# Patient Record
Sex: Female | Born: 1955
Health system: Southern US, Community
[De-identification: ages and names within clinical notes are randomized; demographics above are authoritative.]

## PROBLEM LIST (undated history)

## (undated) DIAGNOSIS — R079 Chest pain, unspecified: Secondary | ICD-10-CM

## (undated) DIAGNOSIS — M545 Low back pain, unspecified: Secondary | ICD-10-CM

## (undated) DIAGNOSIS — I8500 Esophageal varices without bleeding: Secondary | ICD-10-CM

## (undated) DIAGNOSIS — R413 Other amnesia: Secondary | ICD-10-CM

## (undated) DIAGNOSIS — Z794 Long term (current) use of insulin: Secondary | ICD-10-CM

## (undated) DIAGNOSIS — G4730 Sleep apnea, unspecified: Secondary | ICD-10-CM

## (undated) DIAGNOSIS — IMO0001 Reserved for inherently not codable concepts without codable children: Secondary | ICD-10-CM

## (undated) DIAGNOSIS — M1990 Unspecified osteoarthritis, unspecified site: Secondary | ICD-10-CM

## (undated) DIAGNOSIS — M25511 Pain in right shoulder: Secondary | ICD-10-CM

## (undated) DIAGNOSIS — F419 Anxiety disorder, unspecified: Secondary | ICD-10-CM

## (undated) DIAGNOSIS — M79606 Pain in leg, unspecified: Secondary | ICD-10-CM

## (undated) DIAGNOSIS — F32A Depression, unspecified: Secondary | ICD-10-CM

## (undated) DIAGNOSIS — K259 Gastric ulcer, unspecified as acute or chronic, without hemorrhage or perforation: Secondary | ICD-10-CM

## (undated) DIAGNOSIS — R600 Localized edema: Secondary | ICD-10-CM

## (undated) DIAGNOSIS — K760 Fatty (change of) liver, not elsewhere classified: Secondary | ICD-10-CM

## (undated) DIAGNOSIS — G5712 Meralgia paresthetica, left lower limb: Secondary | ICD-10-CM

## (undated) DIAGNOSIS — Z8679 Personal history of other diseases of the circulatory system: Secondary | ICD-10-CM

## (undated) DIAGNOSIS — J189 Pneumonia, unspecified organism: Secondary | ICD-10-CM

## (undated) DIAGNOSIS — R569 Unspecified convulsions: Secondary | ICD-10-CM

## (undated) DIAGNOSIS — F329 Major depressive disorder, single episode, unspecified: Secondary | ICD-10-CM

## (undated) DIAGNOSIS — A389 Scarlet fever, uncomplicated: Secondary | ICD-10-CM

## (undated) DIAGNOSIS — K7460 Unspecified cirrhosis of liver: Secondary | ICD-10-CM

## (undated) DIAGNOSIS — K769 Liver disease, unspecified: Secondary | ICD-10-CM

## (undated) DIAGNOSIS — Z8669 Personal history of other diseases of the nervous system and sense organs: Secondary | ICD-10-CM

## (undated) DIAGNOSIS — E119 Type 2 diabetes mellitus without complications: Secondary | ICD-10-CM

## (undated) DIAGNOSIS — S8412XA Injury of peroneal nerve at lower leg level, left leg, initial encounter: Secondary | ICD-10-CM

## (undated) DIAGNOSIS — I272 Pulmonary hypertension, unspecified: Secondary | ICD-10-CM

## (undated) DIAGNOSIS — G5601 Carpal tunnel syndrome, right upper limb: Secondary | ICD-10-CM

## (undated) DIAGNOSIS — M2550 Pain in unspecified joint: Secondary | ICD-10-CM

## (undated) DIAGNOSIS — F445 Conversion disorder with seizures or convulsions: Secondary | ICD-10-CM

## (undated) DIAGNOSIS — N289 Disorder of kidney and ureter, unspecified: Secondary | ICD-10-CM

## (undated) DIAGNOSIS — K219 Gastro-esophageal reflux disease without esophagitis: Secondary | ICD-10-CM

## (undated) DIAGNOSIS — Z87442 Personal history of urinary calculi: Secondary | ICD-10-CM

## (undated) DIAGNOSIS — Z5189 Encounter for other specified aftercare: Secondary | ICD-10-CM

## (undated) DIAGNOSIS — M21372 Foot drop, left foot: Secondary | ICD-10-CM

## (undated) DIAGNOSIS — Z8614 Personal history of Methicillin resistant Staphylococcus aureus infection: Secondary | ICD-10-CM

## (undated) DIAGNOSIS — H269 Unspecified cataract: Secondary | ICD-10-CM

## (undated) DIAGNOSIS — K829 Disease of gallbladder, unspecified: Secondary | ICD-10-CM

## (undated) DIAGNOSIS — T84498A Other mechanical complication of other internal orthopedic devices, implants and grafts, initial encounter: Secondary | ICD-10-CM

## (undated) DIAGNOSIS — E079 Disorder of thyroid, unspecified: Secondary | ICD-10-CM

## (undated) DIAGNOSIS — M65319 Trigger thumb, unspecified thumb: Secondary | ICD-10-CM

## (undated) DIAGNOSIS — E785 Hyperlipidemia, unspecified: Secondary | ICD-10-CM

## (undated) DIAGNOSIS — I10 Essential (primary) hypertension: Secondary | ICD-10-CM

## (undated) DIAGNOSIS — N189 Chronic kidney disease, unspecified: Secondary | ICD-10-CM

## (undated) DIAGNOSIS — R0602 Shortness of breath: Secondary | ICD-10-CM

## (undated) HISTORY — DX: Depression, unspecified: F32.A

## (undated) HISTORY — DX: Localized edema: R60.0

## (undated) HISTORY — DX: Low back pain, unspecified: M54.50

## (undated) HISTORY — DX: Anxiety disorder, unspecified: F41.9

## (undated) HISTORY — DX: Gastric ulcer, unspecified as acute or chronic, without hemorrhage or perforation: K25.9

## (undated) HISTORY — PX: CHOLECYSTECTOMY: SHX55

## (undated) HISTORY — DX: Pain in leg, unspecified: M79.606

## (undated) HISTORY — DX: Chest pain, unspecified: R07.9

## (undated) HISTORY — DX: Major depressive disorder, single episode, unspecified: F32.9

## (undated) HISTORY — PX: SPINE SURGERY: SHX786

## (undated) HISTORY — DX: Morbid (severe) obesity due to excess calories: E66.01

## (undated) HISTORY — DX: Hyperlipidemia, unspecified: E78.5

## (undated) HISTORY — DX: Unspecified osteoarthritis, unspecified site: M19.90

## (undated) HISTORY — DX: Fatty (change of) liver, not elsewhere classified: K76.0

## (undated) HISTORY — PX: ABDOMINAL HYSTERECTOMY: SHX81

## (undated) HISTORY — DX: Disorder of kidney and ureter, unspecified: N28.9

## (undated) HISTORY — DX: Type 2 diabetes mellitus without complications: E11.9

## (undated) HISTORY — PX: APPENDECTOMY: SHX54

## (undated) HISTORY — PX: TUBAL LIGATION: SHX77

## (undated) HISTORY — DX: Liver disease, unspecified: K76.9

## (undated) HISTORY — DX: Pain in unspecified joint: M25.50

## (undated) HISTORY — DX: Sleep apnea, unspecified: G47.30

## (undated) HISTORY — DX: Pain in right shoulder: M25.511

## (undated) HISTORY — DX: Disease of gallbladder, unspecified: K82.9

## (undated) HISTORY — DX: Disorder of thyroid, unspecified: E07.9

## (undated) HISTORY — DX: Essential (primary) hypertension: I10

---
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
## 1997-11-13 ENCOUNTER — Ambulatory Visit (HOSPITAL_COMMUNITY): Admission: RE | Admit: 1997-11-13 | Discharge: 1997-11-14 | Payer: Self-pay | Admitting: Urology

## 1998-06-13 ENCOUNTER — Emergency Department (HOSPITAL_COMMUNITY): Admission: EM | Admit: 1998-06-13 | Discharge: 1998-06-13 | Payer: Self-pay | Admitting: Emergency Medicine

## 1999-02-21 ENCOUNTER — Encounter: Admission: RE | Admit: 1999-02-21 | Discharge: 1999-02-21 | Payer: Self-pay | Admitting: Family Medicine

## 1999-02-21 ENCOUNTER — Encounter: Payer: Self-pay | Admitting: Family Medicine

## 1999-03-29 ENCOUNTER — Emergency Department (HOSPITAL_COMMUNITY): Admission: EM | Admit: 1999-03-29 | Discharge: 1999-03-29 | Payer: Self-pay | Admitting: Emergency Medicine

## 1999-03-29 ENCOUNTER — Encounter: Payer: Self-pay | Admitting: Emergency Medicine

## 1999-08-18 ENCOUNTER — Inpatient Hospital Stay (HOSPITAL_COMMUNITY): Admission: EM | Admit: 1999-08-18 | Discharge: 1999-08-19 | Payer: Self-pay | Admitting: Psychiatry

## 1999-08-21 ENCOUNTER — Other Ambulatory Visit (HOSPITAL_COMMUNITY): Admission: RE | Admit: 1999-08-21 | Discharge: 1999-09-05 | Payer: Self-pay | Admitting: Specialist

## 1999-11-06 ENCOUNTER — Encounter: Admission: RE | Admit: 1999-11-06 | Discharge: 1999-11-06 | Payer: Self-pay | Admitting: Family Medicine

## 1999-11-06 ENCOUNTER — Encounter: Payer: Self-pay | Admitting: Family Medicine

## 2000-03-09 ENCOUNTER — Emergency Department (HOSPITAL_COMMUNITY): Admission: EM | Admit: 2000-03-09 | Discharge: 2000-03-09 | Payer: Self-pay | Admitting: Emergency Medicine

## 2000-03-17 ENCOUNTER — Other Ambulatory Visit: Admission: RE | Admit: 2000-03-17 | Discharge: 2000-03-17 | Payer: Self-pay | Admitting: Obstetrics and Gynecology

## 2000-07-18 ENCOUNTER — Encounter: Payer: Self-pay | Admitting: *Deleted

## 2000-07-18 ENCOUNTER — Inpatient Hospital Stay (HOSPITAL_COMMUNITY): Admission: EM | Admit: 2000-07-18 | Discharge: 2000-07-19 | Payer: Self-pay | Admitting: *Deleted

## 2001-03-30 ENCOUNTER — Ambulatory Visit (HOSPITAL_BASED_OUTPATIENT_CLINIC_OR_DEPARTMENT_OTHER): Admission: RE | Admit: 2001-03-30 | Discharge: 2001-03-30 | Payer: Self-pay | Admitting: Orthopaedic Surgery

## 2001-03-30 HISTORY — PX: TOENAIL EXCISION: SHX183

## 2001-03-30 HISTORY — PX: FIBULAR SESAMOID EXCISION: SHX1632

## 2001-08-04 ENCOUNTER — Encounter: Admission: RE | Admit: 2001-08-04 | Discharge: 2001-08-04 | Payer: Self-pay | Admitting: Family Medicine

## 2001-08-04 ENCOUNTER — Encounter: Payer: Self-pay | Admitting: Family Medicine

## 2001-08-18 ENCOUNTER — Other Ambulatory Visit: Admission: RE | Admit: 2001-08-18 | Discharge: 2001-08-18 | Payer: Self-pay | Admitting: Obstetrics and Gynecology

## 2001-11-29 ENCOUNTER — Emergency Department (HOSPITAL_COMMUNITY): Admission: EM | Admit: 2001-11-29 | Discharge: 2001-11-29 | Payer: Self-pay | Admitting: Emergency Medicine

## 2002-01-26 ENCOUNTER — Encounter: Admission: RE | Admit: 2002-01-26 | Discharge: 2002-01-26 | Payer: Self-pay | Admitting: Family Medicine

## 2002-01-26 ENCOUNTER — Encounter: Payer: Self-pay | Admitting: Family Medicine

## 2002-01-30 ENCOUNTER — Emergency Department (HOSPITAL_COMMUNITY): Admission: EM | Admit: 2002-01-30 | Discharge: 2002-01-31 | Payer: Self-pay

## 2002-01-31 ENCOUNTER — Encounter: Payer: Self-pay | Admitting: Emergency Medicine

## 2002-04-25 ENCOUNTER — Ambulatory Visit (HOSPITAL_COMMUNITY): Admission: RE | Admit: 2002-04-25 | Discharge: 2002-04-25 | Payer: Self-pay | Admitting: Gastroenterology

## 2002-04-26 ENCOUNTER — Encounter: Payer: Self-pay | Admitting: Family Medicine

## 2002-04-26 ENCOUNTER — Encounter: Admission: RE | Admit: 2002-04-26 | Discharge: 2002-04-26 | Payer: Self-pay | Admitting: Family Medicine

## 2002-08-21 ENCOUNTER — Encounter: Payer: Self-pay | Admitting: Emergency Medicine

## 2002-08-21 ENCOUNTER — Emergency Department (HOSPITAL_COMMUNITY): Admission: EM | Admit: 2002-08-21 | Discharge: 2002-08-21 | Payer: Self-pay | Admitting: Emergency Medicine

## 2002-10-19 ENCOUNTER — Encounter: Admission: RE | Admit: 2002-10-19 | Discharge: 2002-10-19 | Payer: Self-pay | Admitting: Family Medicine

## 2002-10-19 ENCOUNTER — Encounter: Payer: Self-pay | Admitting: Family Medicine

## 2004-04-01 ENCOUNTER — Encounter: Admission: RE | Admit: 2004-04-01 | Discharge: 2004-04-01 | Payer: Self-pay | Admitting: Family Medicine

## 2004-06-09 ENCOUNTER — Ambulatory Visit (HOSPITAL_BASED_OUTPATIENT_CLINIC_OR_DEPARTMENT_OTHER): Admission: RE | Admit: 2004-06-09 | Discharge: 2004-06-09 | Payer: Self-pay | Admitting: Family Medicine

## 2004-06-16 ENCOUNTER — Ambulatory Visit: Payer: Self-pay | Admitting: Internal Medicine

## 2004-10-01 ENCOUNTER — Inpatient Hospital Stay (HOSPITAL_COMMUNITY): Admission: EM | Admit: 2004-10-01 | Discharge: 2004-10-02 | Payer: Self-pay | Admitting: Emergency Medicine

## 2004-10-01 HISTORY — PX: CARDIAC CATHETERIZATION: SHX172

## 2005-06-24 ENCOUNTER — Encounter: Admission: RE | Admit: 2005-06-24 | Discharge: 2005-06-24 | Payer: Self-pay | Admitting: *Deleted

## 2005-07-03 ENCOUNTER — Encounter: Payer: Self-pay | Admitting: Interventional Radiology

## 2005-07-15 ENCOUNTER — Ambulatory Visit (HOSPITAL_COMMUNITY): Admission: RE | Admit: 2005-07-15 | Discharge: 2005-07-15 | Payer: Self-pay | Admitting: Interventional Radiology

## 2006-09-20 ENCOUNTER — Emergency Department (HOSPITAL_COMMUNITY): Admission: EM | Admit: 2006-09-20 | Discharge: 2006-09-20 | Payer: Self-pay | Admitting: Emergency Medicine

## 2006-12-19 ENCOUNTER — Ambulatory Visit (HOSPITAL_COMMUNITY): Admission: RE | Admit: 2006-12-19 | Discharge: 2006-12-19 | Payer: Self-pay | Admitting: Family Medicine

## 2007-01-02 HISTORY — PX: COLONOSCOPY: SHX174

## 2007-02-09 ENCOUNTER — Encounter: Admission: RE | Admit: 2007-02-09 | Discharge: 2007-02-09 | Payer: Self-pay | Admitting: Family Medicine

## 2007-04-28 ENCOUNTER — Encounter: Admission: RE | Admit: 2007-04-28 | Discharge: 2007-04-28 | Payer: Self-pay | Admitting: Family Medicine

## 2007-07-27 ENCOUNTER — Inpatient Hospital Stay (HOSPITAL_COMMUNITY): Admission: EM | Admit: 2007-07-27 | Discharge: 2007-07-28 | Payer: Self-pay | Admitting: Emergency Medicine

## 2007-10-17 ENCOUNTER — Observation Stay (HOSPITAL_COMMUNITY): Admission: EM | Admit: 2007-10-17 | Discharge: 2007-10-17 | Payer: Self-pay | Admitting: Emergency Medicine

## 2007-10-17 ENCOUNTER — Ambulatory Visit: Payer: Self-pay | Admitting: Internal Medicine

## 2007-10-19 ENCOUNTER — Ambulatory Visit (HOSPITAL_COMMUNITY): Admission: RE | Admit: 2007-10-19 | Discharge: 2007-10-19 | Payer: Self-pay | Admitting: Cardiology

## 2007-10-23 ENCOUNTER — Emergency Department (HOSPITAL_BASED_OUTPATIENT_CLINIC_OR_DEPARTMENT_OTHER): Admission: EM | Admit: 2007-10-23 | Discharge: 2007-10-23 | Payer: Self-pay | Admitting: Internal Medicine

## 2007-12-01 DIAGNOSIS — G43909 Migraine, unspecified, not intractable, without status migrainosus: Secondary | ICD-10-CM | POA: Insufficient documentation

## 2007-12-01 DIAGNOSIS — L219 Seborrheic dermatitis, unspecified: Secondary | ICD-10-CM | POA: Insufficient documentation

## 2007-12-01 DIAGNOSIS — R0602 Shortness of breath: Secondary | ICD-10-CM | POA: Insufficient documentation

## 2007-12-01 DIAGNOSIS — I10 Essential (primary) hypertension: Secondary | ICD-10-CM | POA: Insufficient documentation

## 2007-12-01 DIAGNOSIS — N318 Other neuromuscular dysfunction of bladder: Secondary | ICD-10-CM | POA: Insufficient documentation

## 2007-12-01 DIAGNOSIS — F411 Generalized anxiety disorder: Secondary | ICD-10-CM | POA: Insufficient documentation

## 2007-12-01 DIAGNOSIS — E785 Hyperlipidemia, unspecified: Secondary | ICD-10-CM | POA: Insufficient documentation

## 2007-12-01 DIAGNOSIS — E1129 Type 2 diabetes mellitus with other diabetic kidney complication: Secondary | ICD-10-CM | POA: Insufficient documentation

## 2007-12-02 ENCOUNTER — Ambulatory Visit: Payer: Self-pay | Admitting: Internal Medicine

## 2007-12-02 DIAGNOSIS — R635 Abnormal weight gain: Secondary | ICD-10-CM | POA: Insufficient documentation

## 2007-12-13 ENCOUNTER — Telehealth: Payer: Self-pay | Admitting: Internal Medicine

## 2007-12-31 ENCOUNTER — Ambulatory Visit: Payer: Self-pay | Admitting: Internal Medicine

## 2008-10-23 ENCOUNTER — Telehealth (INDEPENDENT_AMBULATORY_CARE_PROVIDER_SITE_OTHER): Payer: Self-pay | Admitting: *Deleted

## 2008-10-27 ENCOUNTER — Encounter: Payer: Self-pay | Admitting: Internal Medicine

## 2008-11-01 ENCOUNTER — Encounter (INDEPENDENT_AMBULATORY_CARE_PROVIDER_SITE_OTHER): Payer: Self-pay | Admitting: *Deleted

## 2008-11-01 DIAGNOSIS — J984 Other disorders of lung: Secondary | ICD-10-CM | POA: Insufficient documentation

## 2008-11-07 ENCOUNTER — Ambulatory Visit: Payer: Self-pay | Admitting: Cardiology

## 2008-12-14 ENCOUNTER — Ambulatory Visit: Payer: Self-pay | Admitting: Internal Medicine

## 2008-12-14 DIAGNOSIS — R930 Abnormal findings on diagnostic imaging of skull and head, not elsewhere classified: Secondary | ICD-10-CM | POA: Insufficient documentation

## 2008-12-20 ENCOUNTER — Ambulatory Visit: Payer: Self-pay | Admitting: Internal Medicine

## 2009-03-27 ENCOUNTER — Ambulatory Visit: Payer: Self-pay | Admitting: Internal Medicine

## 2009-05-25 ENCOUNTER — Ambulatory Visit (HOSPITAL_BASED_OUTPATIENT_CLINIC_OR_DEPARTMENT_OTHER): Admission: RE | Admit: 2009-05-25 | Discharge: 2009-05-25 | Payer: Self-pay | Admitting: Urology

## 2009-05-25 HISTORY — PX: CYSTOSCOPY WITH RETROGRADE PYELOGRAM, URETEROSCOPY AND STENT PLACEMENT: SHX5789

## 2009-06-09 ENCOUNTER — Emergency Department (HOSPITAL_COMMUNITY): Admission: EM | Admit: 2009-06-09 | Discharge: 2009-06-09 | Payer: Self-pay | Admitting: Emergency Medicine

## 2010-03-23 ENCOUNTER — Encounter: Payer: Self-pay | Admitting: Obstetrics and Gynecology

## 2010-04-02 NOTE — Assessment & Plan Note (Signed)
Summary: Pulmonary/ final f/u ov with nl 02 sats with ex   Primary Provider/Referring Provider:  Derrill Memo  CC:  3 month followup.  Pt states that she is tru=ying to quit smoking- still smokes 1 ppd.  On nitocine patch and this seems to help some.  She states that she still c/o SOB with exertion.  She states that this is the same since last seen.  No new complaints today.Marland Kitchen  History of Present Illness: 73 yow smoker with gradually worsening sob since 3/09 to point where now has it sitting still whereas orignially started proportionate to activity, evolved to point where paroxsymal at rest and with talk and not necessarily consistenlty proportionate to ex.  December 02, 2007 initial eval at [facility]'s request for doe plus noisy breathing  heart pounding assoc with chest tight no benefit from inhaler.  No choking cough or loss of voice.  Thyroid and heart fine. Nexium started and it helped heartburn but not the spells.    has had extensive cardiac evaluation and also CT scan of the chest and PFTs all unrevealing (see [facility] for catalogue plus 3 ER evals). D/C lisinopril 10/1, continued nexium, reviewed diet, stopped inhalers  December 31, 2007 ov  not one iota other than no longer needing inhalers, ok sleeping after remeron and clonazepam.  DOE x across the room, sometimes sob at rest.       December 14, 2008 Followup to discuss CT Chest.  Pt c/o "pressure" across the top of her back x 1 month better lying down, not worse with ex.  Pt states that her breathing is about the same.  She has noticed some wheezing over the past few days.  Also c/o increased acid reflux symptoms since changed from nexium to omeprazole- due to ins.  On best days can do big grocery store. rec stop smoking, discuss smoking with psych,      March 27, 2009 3 month followup.  Pt states that she is tr ying to quit smoking- still smokes 1 ppd.  On nicotine patch and this seems to help some.  She states that she still c/o SOB with  exertion.  She states that this is the same since last seen.  No new complaints today. Pt denies any significant sore throat, dysphagia, itching, sneezing,  nasal congestion or excess secretions,  fever, chills, sweats, unintended wt loss, pleuritic or exertional cp, hempoptysis, change in activity tolerance  orthopnea pnd or leg swelling Pt also denies any fluctuation in symptoms with weather or environmental change or other alleviating or factors         Current Medications (verified): 1)  Simvastatin 80 Mg Tabs (Simvastatin) .Marland Kitchen.. 1 Once Daily 2)  Zetia 10 Mg Tabs (Ezetimibe) .Marland Kitchen.. 1 By Mouth Once Daily 3)  Metoprolol Tartrate 25 Mg Tabs (Metoprolol Tartrate) .Marland Kitchen.. 1 By Mouth Two Times A Day 4)  Glumetza 500 Mg Xr24h-Tab (Metformin Hcl) .... 3 Two Times A Day 5)  Miralax  Pack (Polyethylene Glycol 3350) .... Twice Per Week As Needed 6)  Cymbalta 60 Mg Cpep (Duloxetine Hcl) .... Once Daily 7)  Mirtazapine 30 Mg Tabs (Mirtazapine) .... At Bedtime 8)  Clonazepam 0.5 Mg Tabs (Clonazepam) .Marland Kitchen.. 1 Tab Two Times A Day 9)  Nitrostat 0.4 Mg Subl (Nitroglycerin) .... Once Daily As Needed 10)  Omeprazole 40 Mg Cpdr (Omeprazole) .Marland Kitchen.. 1 30 Min Before Breakfast 11)  Maxalt-Mlt 10 Mg Tbdp (Rizatriptan Benzoate) .... As Directed As Needed Migraines  Allergies (verified): No Known Drug  Allergies  Past History:  Past Medical History: Health Maintenance/ Primary...............................................................................................Marland KitchenDr Derrill Memo SEBORRHEIC DERMATITIS (ICD-690.10) OVERACTIVE BLADDER (ICD-596.51) ANXIETY DISORDER (ICD-300.00) MIGRAINE HEADACHE (ICD-346.90) HYPERLIPIDEMIA (ICD-272.4) HYPERTENSION (ICD-401.9) DIABETES MELLITUS (ICD-250.00) DYSPNEA (ICD-786.05) onset 3/09   -  neg LHC  10/01/04............................................................,....................................................Marland KitchenDr Leonia Reeves   -  nl echo 05/10/07   -  PFT's 10/19/07 FEV1 84%   -  CT chest neg 10/23/07   - PFT's nl except ERV disproportionately reduced March 27, 2009   Family History: Reviewed history from 12/31/2007 and no changes required. Father had lung cancer heart attack- grandmother and uncle emphysema- aunts Sarcoidosis- Brother  Social History: Reviewed history from 12/02/2007 and no changes required. Current smoker No ETOH Single lives with son  Vital Signs:  Patient profile:   55 year old female Weight:      280 pounds O2 Sat:      97 % on Room air Temp:     97.7 degrees F oral Pulse rate:   107 / minute BP sitting:   130 / 92  (left arm) Cuff size:   large  Vitals Entered By: Tilden Dome (March 27, 2009 11:50 AM)  O2 Flow:  Room air  Serial Vital Signs/Assessments:  Comments: 12:13 PM Ambulatory Pulse Oximetry  Resting; HR__97___    02 Sat___97__  Lap1 (185 feet)   HR___120__   02 Sat__95___ Lap2 (185 feet)   HR__123___   02 Sat__93___    Lap3 (185 feet)   HR__128___   02 Sat__93___  _x__Test Completed without Difficulty ___Test Stopped due to:  By: Francesca Jewett CMA    Physical Exam  Additional Exam:  in general she is a very anxious ambulatory obese white female in no acute distress with occasional sigh breaths  but no longer classic voice fatigue or pseudowheeze resolves with purse lip maneuver  Wt 284  12/02/07 > 294  December 31, 2007 > December 14, 2008 294  > 280 March 27, 2009  Afeb with normal vital signs HEENT: nl dentition, turbinates, and orophanx. Nl external ear canals without cough reflex Neck without JVD/Nodes/TM Lungs clear to A and P bilaterally without cough on insp or exp maneuvers RRR no s3 or murmur or increase in P2 Abd soft and benign with nl excursion in the supine position. No bruits or organomegaly Ext warm without calf tenderness, cyanosis clubbing or edema Skin warm and dry without lesions     Impression & Recommendations:  Problem # 1:  DYSPNEA (ICD-786.05)   Most likely secondary  to obesity/ deconditioning confirmed today  with pft's.   Weight control is a matter of calorie balance which needs to be tilted in the pt's favor by eating less and exercising more.  Specifically, I recommended  exercise at a level where pt  is short of breath but not out of breath 30 minutes daily.  If not losing weight on this program, I would strongly recommend pt see a nutritionist with a food diary recorded for two weeks prior to the visit.     Medications Added to Medication List This Visit: 1)  Simvastatin 80 Mg Tabs (Simvastatin) .Marland Kitchen.. 1 once daily  Other Orders: Est. Patient Level III (28413) Pulse Oximetry, Ambulatory (24401)  Patient Instructions: 1)  The only abnormality on your PFT's now is directly related to your weight.  This is not lung damage from smoking which will not occur at this point as long as you quit 2)  Weight control is simply a matter of calorie balance which needs  to be tilted in your favor by eating less and exercising more.  To get the most out of exercise, you need to be continuously aware that you are short of breath, but never out of breath, for 30 minutes daily. As you improve, it will actually be easier for you to do the same amount in  30 minutes so always push to the level where you are short of breath.  If this does not result in gradual weight reduction,  I recommend  a nutritionist for a food diary. 3)  Please schedule a follow-up appointment as needed.

## 2010-04-13 ENCOUNTER — Emergency Department (HOSPITAL_BASED_OUTPATIENT_CLINIC_OR_DEPARTMENT_OTHER)
Admission: EM | Admit: 2010-04-13 | Discharge: 2010-04-14 | Disposition: A | Discharge status: Home / Self Care | Payer: BC Managed Care – PPO | Attending: Emergency Medicine | Admitting: Emergency Medicine

## 2010-04-13 DIAGNOSIS — I10 Essential (primary) hypertension: Secondary | ICD-10-CM | POA: Insufficient documentation

## 2010-04-13 DIAGNOSIS — E119 Type 2 diabetes mellitus without complications: Secondary | ICD-10-CM | POA: Insufficient documentation

## 2010-04-13 DIAGNOSIS — E785 Hyperlipidemia, unspecified: Secondary | ICD-10-CM | POA: Insufficient documentation

## 2010-04-13 DIAGNOSIS — E669 Obesity, unspecified: Secondary | ICD-10-CM | POA: Insufficient documentation

## 2010-04-13 LAB — CBC
HCT: 42.1 % (ref 36.0–46.0)
Hemoglobin: 14.1 g/dL (ref 12.0–15.0)
MCH: 26.6 pg (ref 26.0–34.0)
MCHC: 33.5 g/dL (ref 30.0–36.0)
MCV: 79.4 fL (ref 78.0–100.0)
Platelets: 200 10*3/uL (ref 150–400)
RBC: 5.3 MIL/uL — ABNORMAL HIGH (ref 3.87–5.11)
RDW: 13.4 % (ref 11.5–15.5)

## 2010-04-13 LAB — POCT CARDIAC MARKERS: Troponin i, poc: <0.05 ng/mL (ref 0.00–0.09)

## 2010-04-13 LAB — COMPREHENSIVE METABOLIC PANEL
ALT: 32 U/L (ref 0–35)
AST: 54 U/L — ABNORMAL HIGH (ref 0–37)
Alkaline Phosphatase: 88 U/L (ref 39–117)
BUN: 17 mg/dL (ref 6–23)
CO2: 24 mEq/L (ref 19–32)
GFR calc non Af Amer: >60 mL/min (ref >60)
Potassium: 4.9 mEq/L (ref 3.5–5.1)

## 2010-04-19 ENCOUNTER — Observation Stay (HOSPITAL_COMMUNITY)
Admission: EM | Admit: 2010-04-19 | Discharge: 2010-04-20 | Disposition: A | Discharge status: Home / Self Care | Payer: BC Managed Care – PPO | Attending: Internal Medicine | Admitting: Internal Medicine

## 2010-04-19 ENCOUNTER — Emergency Department (HOSPITAL_COMMUNITY): Payer: BC Managed Care – PPO

## 2010-04-19 DIAGNOSIS — R079 Chest pain, unspecified: Principal | ICD-10-CM | POA: Insufficient documentation

## 2010-04-19 DIAGNOSIS — F17200 Nicotine dependence, unspecified, uncomplicated: Secondary | ICD-10-CM | POA: Insufficient documentation

## 2010-04-19 DIAGNOSIS — E785 Hyperlipidemia, unspecified: Secondary | ICD-10-CM | POA: Insufficient documentation

## 2010-04-19 DIAGNOSIS — I10 Essential (primary) hypertension: Secondary | ICD-10-CM | POA: Insufficient documentation

## 2010-04-19 DIAGNOSIS — E119 Type 2 diabetes mellitus without complications: Secondary | ICD-10-CM | POA: Insufficient documentation

## 2010-04-19 LAB — DIFFERENTIAL
Basophils Absolute: 0 10*3/uL (ref 0.0–0.1)
Basophils Absolute: 0.1 10*3/uL (ref 0.0–0.1)
Basophils Relative: 0 % (ref 0–1)
Eosinophils Absolute: 0.2 10*3/uL (ref 0.0–0.7)
Eosinophils Absolute: 0.2 10*3/uL (ref 0.0–0.7)
Eosinophils Relative: 2 % (ref 0–5)
Eosinophils Relative: 2 % (ref 0–5)
Lymphocytes Relative: 40 % (ref 12–46)
Lymphs Abs: 4.2 10*3/uL — ABNORMAL HIGH (ref 0.7–4.0)
Monocytes Absolute: 0.8 10*3/uL (ref 0.1–1.0)
Neutrophils Relative %: 52 % (ref 43–77)

## 2010-04-19 LAB — CBC
HCT: 42.1 % (ref 36.0–46.0)
MCHC: 33.5 g/dL (ref 30.0–36.0)
MCV: 82.4 fL (ref 78.0–100.0)
MCV: 82.2 fL (ref 78.0–100.0)
Platelets: 217 10*3/uL (ref 150–400)
Platelets: 219 10*3/uL (ref 150–400)
RBC: 5.16 MIL/uL — ABNORMAL HIGH (ref 3.87–5.11)
RDW: 13.1 % (ref 11.5–15.5)
RDW: 13.2 % (ref 11.5–15.5)
WBC: 11.1 10*3/uL — ABNORMAL HIGH (ref 4.0–10.5)

## 2010-04-19 LAB — POCT CARDIAC MARKERS
CKMB, poc: <1 ng/mL — ABNORMAL LOW (ref 1.0–8.0)
Troponin i, poc: <0.05 ng/mL (ref 0.00–0.09)

## 2010-04-19 LAB — POCT I-STAT, CHEM 8
Calcium, Ion: 1.19 mmol/L (ref 1.12–1.32)
Chloride: 107 mEq/L (ref 96–112)
Glucose, Bld: 72 mg/dL (ref 70–99)
HCT: 44 % (ref 36.0–46.0)
Hemoglobin: 15 g/dL (ref 12.0–15.0)
TCO2: 26 mmol/L (ref 0–100)

## 2010-04-20 ENCOUNTER — Observation Stay (HOSPITAL_COMMUNITY): Payer: BC Managed Care – PPO

## 2010-04-20 LAB — BASIC METABOLIC PANEL
Calcium: 8.7 mg/dL (ref 8.4–10.5)
Creatinine, Ser: 0.68 mg/dL (ref 0.4–1.2)
GFR calc Af Amer: >60 mL/min (ref >60)
GFR calc non Af Amer: >60 mL/min (ref >60)
Sodium: 139 mEq/L (ref 135–145)

## 2010-04-20 LAB — CARDIAC PANEL(CRET KIN+CKTOT+MB+TROPI)
CK, MB: 0.8 ng/mL (ref 0.3–4.0)
CK, MB: 0.8 ng/mL (ref 0.3–4.0)
Total CK: 60 U/L (ref 7–177)
Total CK: 50 U/L (ref 7–177)

## 2010-04-20 LAB — HEMOGLOBIN A1C
Hgb A1c MFr Bld: 5.7 % — ABNORMAL HIGH (ref <5.7)
Mean Plasma Glucose: 117 mg/dL — ABNORMAL HIGH (ref <117)

## 2010-04-20 LAB — GLUCOSE, CAPILLARY

## 2010-04-20 LAB — LIPID PANEL
Cholesterol: 112 mg/dL (ref 0–200)
LDL Cholesterol: 39 mg/dL (ref 0–99)

## 2010-04-20 MED ORDER — IOHEXOL 350 MG/ML SOLN
100.0000 mL | Freq: Once | INTRAVENOUS | Status: AC | PRN
  Administered 2010-04-20: 100 mL via INTRAVENOUS

## 2010-04-20 NOTE — Consult Note (Addendum)
Katherine [name], Katherine [name]             ACCOUNT NO.:  [id number]  MEDICAL RECORD NO.:  [id number]           PATIENT TYPE:  E  LOCATION:  [facility]                         FACILITY:  [facility]  PHYSICIAN:  Estill Cotta, MD       DATE OF BIRTH:  December 03, 1955  DATE OF CONSULTATION:  04/19/2010 DATE OF DISCHARGE:                                CONSULTATION   REQUESTING PHYSICIAN:  Estill Cotta, MD  CARDIOLOGIST:  Barnett Abu, MD  PRIMARY CARE PHYSICIAN:  Mayra Neer, MD  REASON FOR CONSULTATION:  We are seeing Katherine [name] in consultation with Dr. Tana Coast for evaluation and management of chest pain.  HISTORY OF PRESENT ILLNESS:  Katherine [name] is a 55 year old lady with a history of diabetes, hypertension, hyperlipidemia, obesity, and smoking history who is being admitted for 1 month history intermittent chest pain and facial numbness.  She went to her primary care physician, Dr. Brigitte Pulse, on April 10, 2010 with above-mentioned symptoms.  During her office visit, the patient was told of her blood pressure was elevated and her dose of metoprolol was increased from 25 mg twice a day to 50 mg twice a day.  She was asked to check her blood pressure at home and to contact the physician if her systolics were greater than 631 or diastolics greater than 90.  Katherine [name] reports that since last 1 week her blood pressures have been running between 497W-263 systolic and 785Y-850Y diastolic.    She was in her usual state of health up until this morning when she woke up at noon with facial numbness.  She describes this as not being able to feel her mouth and her jaw line.  These symptoms were accompanied with chest pain, which she describes as "pressure sensation." Her pain lasted for up to an hour and ranked at the pain scale of 2/10.  The pain occurred at rest and was not associated with exertion.  [name] then called her physician this evening.  She was concerned about elevated blood pressure of 220/120, along  with the symptoms of facial numbness and chest pressure.  She was advised to go to the emergency department directly.  Upon arrival to the ED, she had normal EKG, normal cardiac markers.  She was started on aspirin 325 and admitted to the Hospitalist Service for observation.  She also underwent a CT of the head which was negative.  REVIEW OF SYSTEMS:  The 12-point review of systems is negative except for symptoms outlined in HPI above.  PAST MEDICAL HISTORY: 1. Diabetes. 2. Hypertension. 3. Hyperlipidemia. 4. Nephrolithiasis. 5. Obesity. 6. Active smoker.  PAST SURGICAL HISTORY: 1. Appendectomy. 2. Cholecystectomy. 3. Total abdominal hysterectomy.  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  She has no history of alcohol abuse or illicit drug use.  She smokes a pack a day and has been smoking for the last 35 years.  FAMILY HISTORY:  Father had history of lung cancer.  Mother had history of chronic kidney disease.  ALLERGIES:  No known drug allergies.  MEDICATIONS: 1. Aspirin 81 mg once a  day. 2. Zetia 10 mg once a day. 3. Simvastatin 40 mg once a day. 4. Metoprolol recently increased to 50 mg twice a day. 5. Lisinopril 10 mg once a day. 6. Omeprazole 40 mg once a day. 7. Cymbalta 60 mg once a day. 8. Clonazepam 0.5 mg once a day. 9. Mirtazipine 30 mg once a day.  PHYSICAL EXAMINATION:  VITAL SIGNS:  Temperature is 97.7, pulse of 87, blood pressure 152/95, respiratory rate 16, and saturating 100% on room air. GENERAL:  Well-appearing lady in no apparent distress. HEENT: Sclerae are clear.  Extraocular movements intact. NECK:  No carotid bruit.  JVD is difficult to assess secondary to body habitus. ABDOMEN:  Soft, obese, nontender, and nondistended. CARDIOVASCULAR:  Regular rate and rhythm.  Normal S1-S2.  No murmurs, gallops, or rubs. LUNGS:  Clear to auscultation bilaterally. SKIN:  No rash or lesions involving the  exposed skin. EXTREMITIES:  No signs of clubbing, cyanosis, or edema.  She has 2+ dorsalis pedis pulse.  Her feet and hands are warm. NEURO:  Alert and oriented to time, place, and person.  IMAGING:  EKG shows normal sinus rhythm with a rate of 81.  LABORATORY DATA:  Her CBC and chemistries are unremarkable.  First set of cardiac markers are negative.  CT of the head was negative (this was without contrast).  RELEVANT PAST STUDIES:  She had a cardiac catheterization done in 2006 which showed normal coronaries and normal EF.  ASSESSMENT AND PLAN:  In summary, this is a 55 year old lady with history of hypertension, diabetes, hyperlipidemia, obesity, smoking history who has had a long history of atypical chest pain, cardiac catheterization in 2006 showed normal coronaries, who is being admitted with symptoms of facial numbness and chest pain.  We are seeing her in consultation with Dr. Tana Coast for chest pain.  Atypical Chest pain:  Katherine [name] coronary work-up has including a negative stress test performed by Dr. Ilda Foil sometimes in 2005 (per pt) and a cardiac cath in 2006 which showed absolutely no angiographic evidence of CAD.  Her initial work-up in the ED  shows no evidence of ischemia on the ECG and negative cardiac biomarkers.  Although she is currently chest pain free, she has multiple risk factors and I agree with your decision admit her in order to be ruled out with serial cardiac markers.   Her pain may be related with episodes of hypertension.  According to her own blood pressure monitor at home, her systolics were in the range of 557D-220 systolic.  Interestingly, her blood pressure in the emergency department is 152/95.  At this point, I  recommended no further explanation and therapy besides a full-dose of aspirin, beta-blocker, ACE inhibitors, aspirin, and statin.  The patient is not interested in prolonged hospitalization.   For more optimal blood pressure management, I will  increase the dose of metoprolol from 25 mg twice a day to 50 mg.  I have also started her on hydrochlorothiazide 25 mg once a day.  This is in addition to her lisinopril 10 mg once a day.  For risk  stratification, I have added fasting lipid panel to the blood in lab for tomorrow.   Facial Numbness:  She does not have any focal neurological deficits on my exam.  CT of head without contrast was negative.  I'll defer further neuro work-up -if any- to her primary team.     Dub Amis, MD   ______________________________ Estill Cotta, MD    PV/MEDQ  D:  04/19/2010  T:  04/20/2010  Job:  [id number]  Electronically Signed by Dub Amis MD on 04/20/2010 04:40:15 AM Electronically Signed by RIPUDEEP RAI  on 04/25/2010 12:46:47 PM

## 2010-04-25 NOTE — H&P (Signed)
[name], [name]             ACCOUNT NO.:  [id number]  MEDICAL RECORD NO.:  [id number]           PATIENT TYPE:  E  LOCATION:  [facility]                         FACILITY:  [facility]  PHYSICIAN:  Estill Cotta, MD       DATE OF BIRTH:  03/21/1955  DATE OF ADMISSION:  04/19/2010 DATE OF DISCHARGE:                             HISTORY & PHYSICAL   PRIMARY CARE PHYSICIAN:  Mayra Neer, M.D.  CHIEF COMPLAINT:  Chest pain.  HISTORY OF PRESENT ILLNESS:  Katherine [name] is a 55 year old female with history of hypertension, diabetes, hyperlipidemia, active smoking, presented to the [facility] emergency room with the chest pain.  The patient's history was obtained from the patient herself.  The patient stated that she woke up with chest pain this morning.  She had some slight chest pressure the night before; however this morning, she woke up with the chest pain, described as left-sided, substernal, pressure like, 2 to 3 out of 10 in intensity, radiating towards the jaw and towards the forehead.  The patient felt nauseous and somewhat short of breath.  The patient described the chest pain as episodic and usually the episodes lasting about an hour and improved on lying down.  At the time of my encounter, the chest pain had completely resolved.  The patient states that she has been having these episodic chest pains on exertion in the last few months, and her PCP has been working with her in controlling the blood pressure.  She also has been having some facial numbness in the last few months as well with uncontrolled hypertension. The patient has not had any recent cardiac workup.  She had a cardiac catheterization in 2006, which showed normal coronaries.  She, otherwise, denies any vomiting, any abdominal pain, diarrhea, constipation.  She denies any palpitations or any diaphoresis, productive cough, phlegm, or any dizziness, lightheadedness, or any syncopal episode.  PAST MEDICAL HISTORY: 1.  Hypertension. 2. Diabetes. 3. Hyperlipidemia. 4. History of kidney stones. 5. Tobacco abuse.  SURGICAL HISTORY: 1. Appendectomy. 2. Cholecystectomy. 3. Hysterectomy. 4. Back surgery.  SOCIAL HISTORY:  She smokes 1 pack per day for 35 years.  No drugs or any alcohol.  The patient lives at home and is independent of her ADLs.  ALLERGIES:  No known drug allergies.  MEDICATIONS PRIOR TO ADMISSION: 1. Lisinopril 10 mg p.o. daily. 2. Metoprolol 25 mg p.o. b.i.d. 3. Simvastatin 40 mg p.o. daily. 4. Maxalt 10 mg p.r.n. for headaches. 5. Mirtazapine 30 mg p.o. at bedtime. 6. Clonazepam 0.5 mg p.o. b.i.d. 7. Cymbalta 60 mg p.o. daily. 8. Omeprazole 40 mg daily. 9. Zetia 10 mg daily. 10.Metformin 1000 mg p.o. b.i.d.  PHYSICAL EXAMINATION:  VITAL SIGNS:  Blood pressure 138/88, pulse rate 75, respiratory rate 18, temperature 98.2. GENERAL:  The patient is alert, awake, and oriented x3, not in acute distress. HEENT:  Anicteric sclerae.  Conjunctivae clear.  Pupils are reactive to light and accommodation.  EOMI. NECK:  Supple.  No lymphopathy.  No JVD. CARDIOVASCULAR:  S1, S2 clear.  Regular rate and rhythm. CHEST:  Clear to auscultation bilaterally. ABDOMEN:  Soft, nontender, nondistended.  Normal bowel sounds. EXTREMITIES:  No cyanosis, clubbing, or edema noted in upper or lower extremities. NEUROLOGIC:  No focal neurologic deficits noted.  RADIOLOGICAL DATA:  Chest x-ray April 19, 2008, no active cardiopulmonary disease.  CT head without contrast, no acute intracranial abnormalities.  EKG rate 81, normal sinus rhythm.  No ST-T wave changes suggestive of any ischemia.  IMPRESSION AND PLAN:  Katherine [name] is a 55 year old female with history of hypertension, diabetes, hyperlipidemia, active tobacco use who presents with chest pain, which is currently resolved. 1. Chest pain, possibly could be angina equivalent given high risk     factors of hypertension, diabetes, hyperlipidemia,  and active     smoking.  The patient's chest pain has completely resolved at the     time of my examination.  We will admit the patient to telemonitor     bed for observation.  Obtain serial cardiac enzymes, rule out ACS.     Continue aspirin, beta blocker, ACE inhibitor, and statins.  I     discussed in detail with the [facility] on call and the     patient per recommendation does not need any heparin drip at this     time as the cardiac enzyme has been negative so far.  The patient     will be seen by Cardiology for further recommendations.  She will     definitely need risk stratification with an echocardiogram or a     stress test. 2. Diabetes mellitus.  Obtain HbA1c and place the patient on sliding     scale insulin. 3. Hypertension:  Continue all the antihypertensives. 4. Active smoking.  The patient was strongly counseled and NicoDerm     patch will be placed. 5. Prophylaxis.  Lovenox for DVT prophylaxis.     Estill Cotta, MD    RR/MEDQ  D:  04/19/2010  T:  04/19/2010  Job:  [id number]  cc:   Mayra Neer, M.D.  Electronically Signed by Nira Conn Josselin Gaulin  on 04/25/2010 12:46:39 PM

## 2010-04-26 LAB — CULTURE, BLOOD (SINGLE)

## 2010-04-27 NOTE — Discharge Summary (Signed)
Katherine [name], Katherine [name]             ACCOUNT NO.:  [id number]  MEDICAL RECORD NO.:  [id number]           PATIENT TYPE:  I  LOCATION:  1638                         FACILITY:  [facility]  PHYSICIAN:  Sherryl Manges, M.D.  DATE OF BIRTH:  Jan 06, 1956  DATE OF ADMISSION:  04/19/2010 DATE OF DISCHARGE:  04/20/2010                              DISCHARGE SUMMARY   PRIMARY MD:  Katherine Neer, MD  PRIMARY CARDIOLOGIST:  Dr. Leonia Reeves.  DISCHARGE DIAGNOSES: 1. Chest pain, no acute coronary syndrome. 2. Uncontrolled hypertension/possible mild hypertensive     encephalopathy. 3. Type 2 diabetes mellitus. 4. Dyslipidemia. 5. History of urolithiasis. 6. Smoking history.  DISCHARGE MEDICATIONS: 1. Aspirin 325 mg p.o. daily (was on 81 mg p.o. daily). 2. Hydrochlorothiazide 25 mg p.o. daily. 3. Metoprolol tartrate 50 mg p.o. b.i.d. (was on 25 mg p.o. b.i.d.) 4. Clonazepam 0.5 mg p.o. b.i.d. 5. Cymbalta 60 mg p.o. daily. 6. Fish oil 1000 mg p.o. b.i.d. 7. Lisinopril 10 mg p.o. daily. 8. Metformin 2000 mg p.o. b.i.d. 9. Mirtazapine 30 mg p.o. nightly. 10.Omeprazole 40 mg p.o. daily. 11.Potassium gluconate 595 mg p.o. daily. 12.Simvastatin 40 mg p.o. nightly. 13.Vitamin D3 1000 units p.o. daily. 14.Zetia 10 mg p.o. daily.  PROCEDURES: 1. Chest x-ray on April 19, 2009.  This showed no active     cardiopulmonary disease. 2. Head CT scan on April 19, 2010.  This showed no acute     intracranial abnormality. 3. Chest CT angiogram April 20, 2010.  This showed no evidence of     acute pulmonary embolism, other acute chest process stable,     prominent mediastinal lymph nodes, stable chronic lung disease     without suspicious nodule.  CONSULTATIONS:  Dub Amis, MD, cardiologist.  ADMISSION HISTORY:  As in H and P notes of April 19, 2009, dictated by Dr. Estill [name]. However in brief this is a 55 year old female, with known history of hypertension, type 2 diabetes  mellitus, dyslipidemia, urolithiasis, smoking history, morbid obesity, presenting with recurrent episodes of chest pain, as well as markedly elevated blood pressure.  She was admitted for further evaluation, investigation and management.  CLINICAL COURSE:  1. Chest pain.  [name], had a negative stress test     under the hospice of auspices of Dr. Leonia Reeves, cardiologist, and as a     matter of fact, had a cardiac catheterization in February 2006,     which showed normal coronary arteries.  Because of her risk     factors, she was placed on telemetric monitoring.  A 12-lead EKG     was unremarkable.  Cardiac enzymes were cycled, remained unelevated.     Chest x-ray showed no evidence of acute pulmonary pathology.     Because of an elevated D-dimer of 1.72., the patient underwent     chest CT angiogram, which showed no evidence of pulmonary embolism     or indeed any other acute pathology.  She has been reassured     accordingly.  Cardiology consultation was kindly provided by Dr.     Dub Amis, who recommended no further Cardiology workup, but  optimized the patient's antiplatelet medication and     antihypertensive medications.  For the rest course of her     hospitalization, the patient remained asymptomatic.  2. Hypertension.  As described above, the patient did present with     elevated blood pressures apparently as high as 761-470 systolic.     Her beta-blocker treatment has been increased accordingly, and as of     April 20, 2010, a.m, her pressure was much more reasonable at     141/81 mmHg.  She did have some facial numbness at the time of     presentation.  Head CT scan was unremarkable for acute pathology     and she had no focal neurologic deficit during the course of her     hospitalization. Likely, she had mild hypertensive encephalopathy,     which has since resolved with BP control.  3. Type 2 diabetes mellitus.  This was controlled with  carbohydrate-     modified diet, sliding-scale insulin coverage and preadmission oral     hypoglycemics. The patient remained euglycemic.  4. Dyslipidemia.  The patient is on statin treatment.  Lipid profile     showed total cholesterol of 112, triglyceride 181, HDL 37, LDL 39     i.e. excellent lipid profile. She has been reassured accordingly.  5. Smoking history.  The patient was counseled appropriately,and managed     with NicoDerm CQ patch during the course of this hospitalization.  DISPOSITION:  The patient was on April 19, 2010, asymptomatic.  There were no new issues.  She is considered clinically stable for discharge, and was therefore discharged accordingly.  ACTIVITY:  As tolerated.  DIET:  Heart-healthy.  FOLLOWUP INSTRUCTIONS:  The patient is to follow up with her primary MD, Dr. Serita [name], and date to be determined.  She was instructed to call for an appointment on April 22, 2010.  She has verbalized understanding.     Sherryl Manges, M.D.     CO/MEDQ  D:  04/20/2010  T:  04/20/2010  Job:  [id number]  cc:   Katherine [name], M.D. Katherine [name], M.D.  Electronically Signed by Sherryl Manges M.D. on 04/27/2010 01:46:02 PM

## 2010-05-22 LAB — URINE CULTURE: Colony Count: 10000

## 2010-05-22 LAB — POCT CARDIAC MARKERS: Myoglobin, poc: 78.2 ng/mL (ref 12–200)

## 2010-05-27 LAB — POCT I-STAT 4, (NA,K, GLUC, HGB,HCT)
Glucose, Bld: 134 mg/dL — ABNORMAL HIGH (ref 70–99)
HCT: 42 % (ref 36.0–46.0)
Hemoglobin: 14.3 g/dL (ref 12.0–15.0)
Potassium: 4 mEq/L (ref 3.5–5.1)
Sodium: 144 mEq/L (ref 135–145)

## 2010-05-27 LAB — GLUCOSE, CAPILLARY: Glucose-Capillary: 165 mg/dL — ABNORMAL HIGH (ref 70–99)

## 2010-07-16 NOTE — H&P (Signed)
NAMEDEITRA, [name]             ACCOUNT NO.:  [id number]   MEDICAL RECORD NO.:  [id number]          PATIENT TYPE:  INP   LOCATION:  8676                         FACILITY:  [facility]   PHYSICIAN:  Estelle June, MD       DATE OF BIRTH:  [birth year]   DATE OF ADMISSION:  10/16/2007  DATE OF DISCHARGE:                              HISTORY & PHYSICAL   The patient is being admitted to [facility] on October 16, 2007,  at 1:30 in the morning.   CHIEF COMPLAINT:  Chest pain and tachycardia.   HISTORY OF PRESENT ILLNESS:  This is a 55 year old white female with  multiple risk factors for coronary artery disease coming in with chest  pain and tachycardia which started at 1 p.m. on October 16, 2007.  Per  the patient, she had just woken from sleep when the episode started.  At  the peak of its severity, the pain was 10/10, pressure like in nature,  located on the left side of her chest, relieved partially with  sublingual nitro, aggravated by deep breath.  The patient tells me that  she gets similar episodes on a day-to-day basis and it has been told by  her PCP that if she requires more than 3 sublingual nitro per episode,  then she should come to the ER.  The patient took 3 sublingual nitros  without any relief, prompting her to come to the ED for further care.  According to the patient, sometimes she gets nausea, however, this  episode was associated with diaphoresis.  The patient denies any  dizziness or long trips.  No sick contacts and no upper respiratory  tract symptoms.   PAST MEDICAL HISTORY:  1. Diabetes.  2. Hypertension.  3. Hyperlipidemia.  4. Obesity.   1. Clean cath and no coronary artery disease per cardiac cath that was      done in May 2009.  Echo with an EF of about 50-55%.  2. Hysterectomy.  3. Cholecystectomy.  4. Appendicectomy.  5. Back and foot surgery.   ALLERGIES:  She has no known allergies.   MEDICATIONS AT HOME:  1. Clonazepam 0.5 mg b.i.d.  2.  Nexium 40 mg daily.  3. Sublingual nitro 0.4 mg every 5 minutes x3.  4. Remeron 30 mg nightly.  5. Metformin 50 mg b.i.d.  6. Cymbalta 60 mg a day.  7. Lipitor 40 mg a day.  8. Lisinopril 20 mg a day.  9. Hydrochlorothiazide 25 mg a day.  10.Zetia 10 mg a day.   REVIEW OF SYSTEMS:  Please refer to the HPI.  Rest of the 14-point  review of systems is negative.   VITAL SIGNS:  On admission showed a temperature of 98.2, pulse 110,  respirations 24, blood pressure 137/71, and the patient was sating 96%  on room air.  GENERAL:  This is an obese white female, in no acute distress.  NECK:  Unable to assess JVD because of her obese neck.  HEART:  She is tachycardic, regular rate and rhythm on cardiac  auscultation.  LUNGS:  Mild crackers at bilateral bases.  LOWER EXTREMITY:  Edema +1 bilaterally.   PERTINENT LABS:  White count of 12.4, hemoglobin 14.8, crit 43.5, and  platelets 237.  First set of cardiac enzymes is negative.  D-dimer is  negative.  Chest x-ray with mild chronic bronchitis changes.  Sodium  138, potassium 4, chloride 104, bicarb 24, BUN 15, creatinine 0.96,  glucose 135, and calcium 9.5.  EKG with sinus tachycardia   ASSESSMENT AND PLAN:  A 55 year old female coming in with chest pain  unrelieved by nitro.  The patient is currently chest pain free.  Plan is  to admit the patient to a tele bed, do serial cardiac enzymes.  We will  give her 1 dose of 325 mg of aspirin and start a low-dose beta-blocker.  We will continue all her home medications, do not plan to hold the  metformin since I have low suspicion that she will require a cardiac  cath while she is in the hospital.  The patient likely can be discharged  after being ruled out in 24 hours.      Estelle June, MD  Electronically Signed     RP/MEDQ  D:  10/17/2007  T:  10/17/2007  Job:  [id number]

## 2010-07-16 NOTE — H&P (Signed)
Katherine [name], Katherine [name]             ACCOUNT NO.:  [id number]   MEDICAL RECORD NO.:  [id number]          PATIENT TYPE:  INP   LOCATION:  3701                         FACILITY:  [facility]   PHYSICIAN:  Barnett Abu, M.D.  DATE OF BIRTH:  12-19-1955   DATE OF ADMISSION:  07/27/2007  DATE OF DISCHARGE:                              HISTORY & PHYSICAL   CHIEF COMPLAINT:  Chest pain, dyspnea.   HISTORY:  [name] is a pleasant 55 year old female with multiple risk  factors for coronary artery disease who presents to the office today  with chest pain that has increased in frequency over the last week.  She  states it is a some midsternal chest tightness that radiates to the neck  and down the left upper arm.  She will get shortness of breath as well  as nausea and sweats with it.  Activity such as walking 15-20 feet will  bring it on and she has also had some at night while lying in bed.  Typically discomfort last anywhere from half an hour to several hours at  a time.  She does not take anything for the discomfort, it gradually  goes away.  She presented to the office today actually with discomfort,  5 on a scale of 1-10 that resolved with 2 nitroglycerin sublingual  tablets.  She is being admitted to rule out acute coronary syndrome.   REVIEW OF SYSTEMS:  As above.  GENERAL:  No recent fever, chills,  problems with weight gain, or loss.  HEENT:  No problems with vision,  hearing, sore throat, or nasal obstruction.  CHEST:  As above.  Denies  palpitations, tachyarrhythmias.  Denies pleuritic chest discomfort.  LUNGS:  Increased dyspnea over the last couple weeks, echo obtained,  results below.  Denies cough or wheezing.  Denies PND or orthopnea.  ABDOMEN:  No nausea, vomiting, abdominal pain, hematochezia, or melena.  GU:  Denies dysuria, urinary frequency, or hematuria.  EXTREMITIES:  Denies any significant joint discomfort, denies edema.   PAST MEDICAL HISTORY:  1. Diabetes mellitus,  type 2, on Glucophage.  2. Hypertension.  3. Hypercholesterolemia.  4. Obesity.  5. No coronary disease by cath, October 01, 2004.  6. EF 55-60% by echo, May 10, 2007.   PAST SURGICAL HISTORY:  1. Hysterectomy.  2. Cholecystectomy.  3. Appendectomy.  4. Back and foot surgery.   CURRENT MEDICATIONS:  1. Cymbalta 60 mg a day.  2. Metoprolol 25 mg half tablet twice a day.  3. NicoDerm CQ patch daily.  4. Multivitamin daily.  5. Metformin 500 mg twice a day.  6. Clonazepam 0.5 mg twice a day.  7. OTC potassium 99 mg daily.  8. Mirtazapine 30 mg at bedtime daily.  9. Zetia 10 mg a day.  10.Lipitor 40 mg a day.   DRUG ALLERGIES:  None known.   SOCIAL HISTORY:  Widowed, husband died of lung cancer.  Has 2 grown  children, both healthy.  Has smoked a pack of cigarettes daily for 32  years, quit on Katherine [name]'s Day of this year.  No alcohol or illicit drug  use.   FAMILY HISTORY:  Father died of lung cancer at age 77.  Katherine [name] still  living at 55 years of age, has kidney problems.  She has 1 Katherine [name] with  a heart valve problem, another Katherine [name] with sarcoidosis.  She has 2  sisters.  She thinks one of them may have hypertension.  Her paternal  grandmother died in her 75s from heart attack.  Paternal uncle died in  his 26s from an MI.   OBJECTIVE DATA:  VITAL SIGNS:  Weight 271, height 66 inches, pulse 88,  and blood pressure 120/100.  GENERAL:  Very pleasant, pale, in no acute distress.  HEENT:  Ear, nose and throat unremarkable.  NECK:  No JVD.  No adenopathy.  Thyroid gland is not enlarged.  No  carotid bruits auscultated.  CHEST:  Normal S1 and S2 without murmur, S3, S4, rub, click, or gallop.  LUNGS:  Good excursion, clear throughout.  ABDOMEN:  Soft, obese.  Bowel sounds present.  No abdominal bruits are  auscultated, but exam difficult due to body habitus.  No masses felt.  GU/RECTAL:  Deferred.  EXTREMITIES:  MAE x4.  No clubbing, cyanosis, or edema.  NEURO:  Nonfocal.    EKG reveals normal sinus rhythm, rate at 80 beats per minute with  nonspecific T-wave abnormality.   A 2D cardiac echo revealed EF 66-44% with diastolic dysfunction, May 10, 2007.   Cardiac catheterization, no CAD, October 01, 2004.   IMPRESSION:  1. Chest discomfort relieved with nitroglycerin.  2. Hypertension.  3. Diabetes mellitus.  4. Hypercholesterolemia.  5. Obesity.  6. Mild diastolic dysfunction on echo with normal EF 55-60%.   PLAN:  Admit.  IV nitroglycerin and titrate for pain control.  Obtain  cardiac isoenzymes.  Start Lovenox subcu.  N.p.o. after midnight for  cardiac catheterization in the morning.  We will hold her Glucophage for  now.      [name]      Barnett Abu, M.D.  Electronically Signed    TCL/MEDQ  D:  07/27/2007  T:  07/28/2007  Job:  [id number]   cc:   Osvaldo Human, M.D.

## 2010-07-19 NOTE — H&P (Signed)
[name], [name]             ACCOUNT NO.:  [id number]   MEDICAL RECORD NO.:  [id number]          PATIENT TYPE:  INP   LOCATION:  6526                         FACILITY:  [facility]   PHYSICIAN:  Katherine Redden, MD      DATE OF BIRTH:  1955-09-09   DATE OF ADMISSION:  09/30/2004  DATE OF DISCHARGE:                                HISTORY & PHYSICAL   HISTORY OF PRESENT ILLNESS:  Katherine [name] is a 55 year old lady who has a  history of recurrent chest pain. The patient states that on Wednesday she  had an episode of chest pain which was more prolonged than usual, substernal  in location, and associated with some dyspnea on exertion. Today she was in  a restaurant when she experienced another episode of chest pain described as  left parasternal with associated left arm numbness. When she got up from the  table and walked across the room she says she became short of breath. She  does not recall being diaphoretic or nauseated. She contacted Dr. Lenon Curt  [name]'s office and was advised to [facility]. There  her status was reviewed and she was advised to go to the emergency room. In  the emergency room her blood pressure was initially 141/78, pulse was 94, O2  saturation was 97%.   Subsequent relevant laboratory studies have included normal fibrin  degradation products, normal electrolytes, hemoglobin of 15 and initial  negative cardiac enzymes. No definite ischemic changes are seen on EKG. The  patient is admitted at this time for evaluation of chest pain. Of note is  that Katherine [name] was hospitalized in 2002 presenting with an episode of chest  pain. During the course of that hospitalization she had a Cardiolite study  done which was reportedly negative. It is also relevant that she has a 50  pack year smoking history as well as a history of hypertension and  hyperlipidemia.   PAST MEDICAL HISTORY:   MEDICATIONS:  1.  Zetia 10 mg daily.  2.  Hydrochlorothiazide  25 mg daily.  3.  Esterase 1 mg daily.  4.  Lipitor 40 mg daily.  5.  Risperdal 0.5 mg daily.  6.  Klonopin 0.5 mg daily.  7.  Cymbalta 60 mg daily.  8.  Remeron 30 mg daily.  9.  Maxalt-MLT 10 mg p.r.n. for migraine headaches.   ALLERGIES:  There are no known drug allergies.   OPERATIONS:  She has had a previous hysterectomy, cholecystectomy,  appendectomy, a laminectomy about 1998, and a foot operation apparently for  removal of her splinters.   MEDICAL ILLNESSES:  1.  There is a 10-year history of hypertension.  2.  The patient has a history of hyperlipidemia.  3.  Morbid obesity.  4.  The patient was diagnosed this year with sleep apnea. She states that      she was prescribed nasal C-PAP but unfortunately she is unable to      tolerate it.  5.  The patient has a history of pseudoseizures documented in the past      medical records that apparently has  been evaluated by Katherine [name].   FAMILY HISTORY:  This is notable for the patient's grandmother having died  in her 59's of an MI. A paternal uncle died in his 76's from an MI. She says  that she has a brother and sister who have hypertension. Another brother has  sarcoidosis.   SOCIAL HISTORY:  The patient smokes 1-2 packs per day. She states that she  does not abuse alcohol or drugs.   REVIEW OF SYSTEMS:  HEAD:  She denies headache or dizziness. EYES:  She  denies visual blurring or diplopia. EAR/NOSE/THROAT:  Denies ear ache, sinus  pain, or sore throat. CHEST: There has been no coughing or wheezing, she  says she has very poor exercise tolerance and becomes out of breath easily  with exertion. CARDIOVASCULAR:  See above.  GI: There is no recent history of indigestion or heartburn. There has been  no hematemesis or melena. GU:  She denies dysuria or urinary frequency.  NEURO:  No history of stroke, otherwise see above.   PHYSICAL EXAMINATION:  VITAL SIGNS:  Are as previously mentioned.  HEENT EXAM:  Within normal  limits.  CHEST:  Clear.  CARDIOVASCULAR:  Normal S1, S2 without rubs, murmurs or gallops.  ABDOMEN:  Obese, normal bowel sounds, there are no masses or tenderness, no  guarding or rebound.  NEUROLOGIC TESTING:  Cranial nerves II-XII grossly intact, motor and sensory  and cerebellar testing is normal.  EXTREMITIES:  No evidence of cyanosis or edema.   IMPRESSION:  1.  Recurrent chest pain.  2.  Hypertension.  3.  Hyperlipidemia.  4.  Fifty pack year smoking history.  5.  Morbid obesity.  6.  Depression.  7.  History of pseudoseizures.  8.  Status post hysterectomy.  9.  History of cholecystectomy and appendectomy.  10. Sleep apnea.   The patient will be admitted to rule out myocardial infarction. Will follow  standard rule out MI protocol. Dr. Leonia [name] and colleagues will be consulted  for consideration of Cardiolite stress study.       SY/MEDQ  D:  10/01/2004  T:  10/01/2004  Job:  [id number]   cc:   Katherine [name], M.D.  [address] [place]  [place] [address]  Fax: [phone]

## 2010-07-19 NOTE — Cardiovascular Report (Signed)
NAMEDEMARIS, Katherine [name]             ACCOUNT NO.:  [id number]   MEDICAL RECORD NO.:  [id number]          PATIENT TYPE:  INP   LOCATION:  6526                         FACILITY:  [facility]   PHYSICIAN:  Barnett Abu, M.D.  DATE OF BIRTH:  03/20/1955   DATE OF PROCEDURE:  10/01/2004  DATE OF DISCHARGE:                              CARDIAC CATHETERIZATION   PROCEDURES PERFORMED:  1.  Left heart catheterization.  2.  Left ventriculogram.  3.  Coronary angiography.   INDICATIONS:  Katherine [name] is a 55 year old woman who presented with  atypical angina but in a potentially unstable fashion. She has multiple risk  factors for coronary heart disease. She is to undergo diagnostic cardiac  catheterization at this time to identify the extent of disease and provide  further therapeutic options.   PROCEDURE NOTE:  The patient is brought to cardiac catheterization  laboratory in the fasting state. The right groin was prepped and draped in  the usual sterile fashion. Local anesthesia was obtained with infiltration  of 1% lidocaine. A 6-French catheter sheath was inserted percutaneously in  the right femoral artery utilizing an anterior approach or a guiding J-wire.  A 110 cm pigtail catheter was used to measure pressures in the ascending  aorta and in the left ventricle, both prior to and following the  ventriculogram. A 30 degrees RAO cine left ventriculogram was performed  utilizing a power injector. Then 40 cc of non-nonionic contrast were  injected at 13 cc per second. Coronary angiography was then performed using  6-French #4 left and right Judkins catheters. Cineangiography of each  coronary was conducted in multiple LAO and RAO projections. All catheter  manipulations were performed using fluoroscopic observation and exchanges  performed over a long guiding J-wire. At the completion of the procedure, a  right femoral arteriogram in the 45 degree RAO angulation via the catheter  sheath by  hand injection demonstrated adequate anatomy for placement  percutaneous closure device AngioSeal. This was successfully deployed with  good hemostasis and intact distal pulse. The patient was transported to the  recovery area in stable condition.   HEMODYNAMIC RESULTS:  Systemic arterial pressure was 137/87 with a mean of  107 mmHg. There was no systolic gradient across the aortic valve. Left  ventricular end-diastolic pressure was 19 mmHg pre and post ventriculogram.   ANGIOGRAPHY:  The left ventriculogram demonstrated normal chamber size and  normal global systolic function without regional wall motion abnormality. A  visual estimate of the ejection fraction is 65-70%. There was no coronary  calcification and there was no mitral regurgitation. The aortic valve was  trileaflet opened normally during systole.   There was a right dominant coronary system present. The main left coronary  was normal.   Left anterior descending artery and its branches were normal.   The left circumflex coronary artery and its branches were normal.   The right coronary artery and its branches were normal.   FINAL IMPRESSION:  1.  Intact ventricular size and global systolic function.  2.  Normal coronary arteries.  3.  Noncardiac chest pain.   PLAN/RECOMMENDATIONS:  Outpatient discharge medical therapy only.       JHE/MEDQ  D:  10/01/2004  T:  10/02/2004  Job:  [id number]   cc:   Osvaldo Human, M.D.  [address] [place]  [place] [address]  Fax: [phone]

## 2010-07-19 NOTE — Procedures (Signed)
NAME:  Katherine [name], FOLK             ACCOUNT NO.:  [id number]   MEDICAL RECORD NO.:  [id number]          PATIENT TYPE:  OUT   LOCATION:  SLEEP CENTER                 [facility]AC[facility]   PHYSICIAN:  Clinton D. Annamaria Boots, M.D. DATE OF BIRTH:  22-Sep-1955   DATE OF STUDY:  06/09/2004                              NOCTURNAL POLYSOMNOGRAM   REFERRING PHYSICIAN:  Osvaldo Human, M.D.   INDICATIONS FOR STUDY:  Hypersomnia with sleep apnea. Epworth sleepiness  score 6/24, BMI 44, weight 275 pounds.   SLEEP ARCHITECTURE:  Total sleep time 376 minutes with sleep efficiency of  88%. Stage I was 20%, stage II was 43%, stages III and IV were 20%, REM was  16% of total sleep time. Sleep latency was 9 minutes. REM latency 258  minutes. Awake after sleep onset 43 minutes. Arousal index 59, which is  increased. Medications taken at bedtime: Remeron and clonazepam.   RESPIRATORY DATA:  Split study protocol. Respiratory disturbance index (RDI,  AHI) 88.4 obstructive events per hour indicating severe obstructive sleep  apnea/hypopnea syndrome. This included 1 obstructive apnea and 234 hypopneas  before CPAP. Most initial sleep and virtually all events were recorded while  supine. REM RDI was 1 per hour. CPAP was titrated to 10 CWP, RDI 0.8 per  hour using a small ResMed Swift nasal pillows.   OXYGEN DATA:  Moderate to loud snoring with oxygen desaturation to a nadir  of 73% before CPAP. After CPAP control oxygen saturation held 95% to 98% on  room air.   CARDIAC DATA:  Normal sinus rhythm.   MOVEMENT/PARASOMNIA:  A total of 127 limb jerks were recorded of which 82  were associated with awakening or arousal for periodic limb movement with  arousal index of 13.1 per hour which is increased.   IMPRESSION/RECOMMENDATIONS:  1.  Severe obstructive sleep apnea/hypopnea syndrome, RDI 88.4 per hour with      oxygen desaturation to 73%.  2.  Successful CPAP titration to 10 CWP, RDI 0.8 per hour, using a ResMed   Swift with small nasal pillows.  3.  Periodic limb movement with arousal, RDI 13.1 per hour.  This should be      reconsidered clinically after time for adjustment of CPAP.    CDY/MEDQ  D:  06/16/2004 14:55:18  T:  06/16/2004 18:33:56  Job:  [id number]

## 2010-07-19 NOTE — H&P (Signed)
[place]  Patient:    Katherine [name], Katherine [name]                    MRN: [id number] Adm. Date:  [id number] Attending:  Martin Majestic                         History and Physical  CHIEF COMPLAINT: Chest pain.  HISTORY OF PRESENT ILLNESS: The patient is a 55 year old female with known diagnosis of hypertension, depression, and pseudoseizure disorder.  She actually was in the office yesterday with another episode of her pseudoseizures involving the right arm primarily.  She has had these in the past.  Negative work-up per Dr. Jannifer Franklin.  Medicines that she has been on in the past have really not made a difference whether she has them or not.  She has been seen by them the end of last year and had a normal EEG, and has stayed off medicines since that time.  She had an episode yesterday when she came by the office to get a BP check, lasting maybe 10-15 minutes.  She is aware that they occur and never loses consciousness.  Examination at that time was unremarkable.  Laboratories drawn were unremarkable.  She had an uneventful night and today about noon time while just sitting began to experience an upper chest tightness which seemed to radiate to the left scapula and then to the left arm and down to the elbow, an achy sensation.  She did feel a little nauseous but had no vomiting.  It lasted about 45 minutes until she got to the ER.  It did seem to decrease totally with nitroglycerin after about five minutes.  She had no diaphoresis or palpitations, but did feel like she needed to take a deep breath with this.  She does also relate a history of several months of having some occasional tightness when she is overexerting herself. It does not occur every time and may last a couple of hours when she gets it. It may happen once or twice a week.  She has never related this to anyone per se.  Her risk factors include her paternal grandmother having died in her  30s with MI, paternal uncle in his 75s.  She is a one pack a day smoker.  She has a history of hypertension.  Apparently there is a history of lipid abnormality and she had been on medicine at one time, but not recently.  At the time of my examination she was pain-free, and she is being admitted now for rule out.  PAST MEDICAL/SURGICAL HISTORY:  1. Total hysterectomy, noncancer.  2. Cholecystectomy.  3. Appendectomy.  4. Laminectomy about 1998.  ALLERGIES: She denies any allergies.  REVIEW OF SYSTEMS: Unremarkable except as related above.  She did have a GYN examination in November 2001 that was normal along with mammogram.  PHYSICAL EXAMINATION:  GENERAL: NAD.  VITAL SIGNS: BP 131/70, pulse 78 and regular, respirations 16 and unlabored.  HEENT: TMs and canals clear.  PERRL.  EOMI.  Discs flat.  Nose and throat clear.  NECK: Supple.  No adenopathy, JVD, bruits, or enlarged thyroid.  CHEST: Clear.  She did have tenderness in the left upper anterior chest wall. She said this reproduced the pain she has been experiencing.  CARDIAC: RR, without murmurs, gallop, clicks, rubs.  ABDOMEN: Normal BS, soft, nontender.  No masses or organomegaly.  PELVIC: Not done.  EXTREMITIES: No  peripheral edema.  Peripheral pulses intact.  LABORATORY DATA: EKG normal, no change from that at the office.  CXR NAD.  Laboratories are still pending.  ASSESSMENT:  1. Chest pain, suspect noncardiac but has significant risk factors and     involvement with arm and some nausea today, different than the pattern she     has had.  Need to rule out.  2. Hypertension, controlled.  3. Depression, controlled.  4. Pseudoseizures, stable.  PLAN:  1. Enzymes.  2. Aspirin.  3. Continue routine meds.  4. Probable will need some sort of stress Cardiolite. DD:  07/18/00 TD:  07/19/00 Job: [id number]

## 2010-07-19 NOTE — Discharge Summary (Signed)
[place]  Patient:    HARTLYN, REIGEL                        MRN: [id number] Adm. Date:  [id number] Disc. Date: [id number] Attending:  Chucky May Dhami                           Discharge Summary  HISTORY:  [name] is a 55 year old widowed white female who has been known to me since September 01, 1997 and has history of major depressive disorder and panic disorder with agoraphobia.  She was doing reasonably well when she began to decompensate.  She became acutely depressed and suicidal and was admitted to [facility] from August 18, 1999 to August 19, 1999.  She had advised that she was feeling quite depressed, hopeless, helpless and worthless.  She had been having severe panic attacks.  She could not function at work or at home and hence, this admission.  PAST PSYCHIATRIC HISTORY:  History of depression since 54.  She took Paxil for about five years and had presented with severe depression and panic attacks in 1999.  She was treated with Effexor XR and did well until recently when she began to decompensate.  SOCIAL HISTORY:  She is a widow.  She is employed.  She has one son who lives with her.  There is no history of childhood abuse.  FAMILY HISTORY:  Positive depression in mother; she was admitted to [facility].  ALCOHOL AND DRUG HISTORY:  None.  MEDICAL HISTORY:  Seizure disorder since 1998.  MEDICAL PROBLEMS:  Patient sees Dr. Lonzo Candy for the same.  CURRENT MEDICATIONS:  Serzone; patient is being tapered off of that.  Celexa has been added.  She is also on Tegretol and Xanax.  ALLERGIES:  None.  PHYSICAL EXAMINATION:  Physical examination was done by Dr. Talmage Coin while she was an inpatient on September 17, 1999.  MENTAL STATUS EXAMINATION:  Affect is constricted.  Mood is depressed. Patient is fully alert and oriented, cooperative, pleasant, labile, coherent, logical, goal-directed, depressed, ______  hopeless, helpless,  worthless, not suicidal.  Thought process and cognitively, patient is intact, although somewhat regressed.  IMPRESSION: Axes I:    1. Major depressive disorder, recurrent episode, severe, without               psychotic features.            2. Panic disorder with agoraphobia. Axis II:   Deferred. Axis III:  Seizure disorder. Axis IV:   Nonspecific. Axis V:    50/70.  PLAN:  Patient was admitted to partial.  She will continue to taper off Serzone and we will continue to increase Celexa to 40 mg a day.  She will stay on ______ .  She will be encouraged to attend partial program and will be discharged to outpatient therapy after completion of this program. DD:  09/29/99 TD:  10/01/99 Job: [id number]

## 2010-07-19 NOTE — Op Note (Signed)
[place]. [facility]  Patient:    Katherine [name], Katherine [name] Visit Number: [id number] MRN: [id number]          Service Type: DSU Location: [facility] Attending Physician:  Melrose Nakayama Dictated by:   Monico Blitz. Rhona Raider, M.D. Proc. Date: 03/30/01 Admit Date:  03/30/2001                             Operative Report  PREOPERATIVE DIAGNOSES: 1. Left foot sesamoiditis. 2. Left foot ingrown toenail.  POSTOPERATIVE DIAGNOSES: 1. Left foot sesamoiditis. 2. Left foot ingrown toenail.  PROCEDURES: 1. Left foot fibular sesamoidectomy. 2. Left foot great toenail partial excision.  ANESTHESIA:  General.  SURGEON:  Monico Blitz. Rhona Raider, M.D.  ASSISTANT:  Dione Housekeeper, P.A.  INDICATION FOR PROCEDURE:  The patient is a 55 year old woman who suffered a trauma to her left foot more than a year ago.  She apparently suffered a fracture of her sesamoid.  We initially treated this with an orthotic, and she did fairly well.  Unfortunately, she has been left with persistent pain on weightbearing.  She is offered a fibular sesamoidectomy for the fragment of bone at this point.  The procedure was discussed with the patient, and informed operative consent was obtained after discussion of the possible complications of, reaction to anesthesia, and infection.  At the time of her surgery she also noted that she had an ingrown toenail that she would like to have excised.  DESCRIPTION OF PROCEDURE:  The patient was taken to the operating suite, where a general anesthetic was applied without difficulty.  She was positioned supine and prepped and draped in normal sterile fashion.  After administration of preop IV antibiotics, the left leg was elevated, exsanguinated, and the tourniquet inflated about the calf.  Small plantar incision was made with dissection down to the flexor hallucis brevis and the fibular sesamoid, which was contained in the tendon.  CAre was taken to retract the  neurovascular structures out of harms way.  A longitudinal incision was made in the FHD, and he fragment of the fibular sesamoid was excised.  Fluoroscopy was used to confirm adequacy of the resection, and I read these views myself.  The wound was irrigated, followed by reapproximation of the tendon side-to-side with 2-0 Vicryl.  Vicryl 2-0 undyed was also used to reapproximate the subcutaneous tissues, followed by skin closure with nylon with care taken to keep the skin edges flush with one another.  We then performed an excision of the medial border of her great toenail, which was ingrown.  This was taken back to the nail bed and phenol was placed into the nail bed to help with ablation of this portion of the nail.  Xeroform was applied here, and Adaptic was placed on her plantar wound.  The tourniquet was deflated to the toe, and the foot became pink and warm immediately.  Dry gauze was applied along with a loose Ace wrap. Estimated blood loss and intraoperative fluids can be obtained from the anesthesia records, as can an adequate tourniquet time.  DISPOSITION:  The patient was extubated in the operating room and taken to the recovery room in stable condition.  Plans were for her to go home the same day and to follow up in the office in less than a week.  I will contact her by phone tonight. Dictated by:   Monico Blitz Rhona Raider, M.D. Attending Physician:  Melrose Nakayama DD:  03/30/01  TD:  03/30/01 Job: [id number]

## 2010-07-19 NOTE — Consult Note (Signed)
Katherine [name], Katherine [name]             ACCOUNT NO.:  [id number]   MEDICAL RECORD NO.:  [id number]          PATIENT TYPE:  INP   LOCATION:  6526                         FACILITY:  [facility]   PHYSICIAN:  Barnett Abu, M.D.  DATE OF BIRTH:  10-27-1955   DATE OF CONSULTATION:  10/01/2004  DATE OF DISCHARGE:                                   CONSULTATION   REASON FOR CONSULTATION:  Chest pain.   HISTORY OF PRESENT ILLNESS:  Katherine [name] is a 55 year old woman with  multiple risk factors for coronary heart disease who describes a two-week  history of chest tightness mid substernal slightly to the left side.  It  would occur intermittently and somewhat on a random basis but it does seem  to her that it is more common if she is active or exerting herself.  This  was minimal exertion.  She is not particularly physically active.  She has  had some episodes of nausea and lightheadedness.  This is not associated,  necessarily with the chest discomfort.  As of Sunday she started having left  arm tingling.  This was associated with the chest tightness.  She called Dr.  Romero Liner office thinking perhaps she needed another cortisone injection in  her left shoulder.  She was referred to the emergency room, but presented to  [facility] who subsequently referred her to the ER  yesterday evening.  Since admission she has continued to have chest  tightness which is intermittently present this morning.  She has had  negative cardiac enzymes and her EKG is non-acute.   Her cardiac risk factors include age, hypertension, tobacco abuse, elevated  lipids, and obesity.  She has a remote early family history of coronary  disease in her grandmother who died in her 49s of a myocardial infarction.   PAST MEDICAL HISTORY:  1.  Hypertension.  2.  Hyperlipidemia.  3.  Migraine headaches.  4.  Depression.  5.  Obesity.  6.  Sleep apnea, unable to tolerate CPAP mask treatment.   PAST  SURGICAL HISTORY:  1.  Total hysterectomy, noncancerous.  2.  Cholecystectomy.  3.  Appendectomy.  4.  Remote laminectomy.   ALLERGIES:  None known.   CURRENT MEDICATIONS:  1.  Clonazepam 0.5 mg p.o. q.h.s.  2.  Risperdal 0.5 mg p.o. q.h.s.  3.  Lipitor 40 mg p.o. daily.  4.  Estrace 1 mg p.o. daily.  5.  HCTZ 25 mg p.o. daily.  6.  Cymbalta 60 mg p.o. daily.  7.  Zetia 10 mg p.o. daily.  8.  Maxalt MLT 10 mg p.r.n. migraine.  9.  Remeron generic 30 mg p.o. q.h.s.   FAMILY HISTORY:  As above.  Her father died of lung cancer.  Her mother is  alive and well.   SOCIAL HISTORY:  Smokes one to two packs of cigarettes a day.  No alcohol or  drug use.  She is widowed.  She has two grown sons 22 and 36 years of age.  She works as an Passenger transport manager for [employer]:  Noncontributory except as  mentioned above.   PHYSICAL EXAMINATION:  VITAL SIGNS:  In the emergency room her blood  pressure is 116/63, pulse is 95 and regular, respiratory rate is 20,  temperature 97.6.  O2 saturation on room air 98%.  GENERAL:  This is a 55 year old obese, pleasant Caucasian woman in no  distress.  HEENT:  Unremarkable.  The head is atraumatic, normocephalic.  The pupils  are equal, round, and reactive to light and accommodation.  Extraocular  movements are intact.  Sclerae are anicteric.  Oral mucosa is pink and  moist.  The tongue is not coated.  NECK:  Supple without thyromegaly or masses.  The carotid upstrokes are  normal.  There is no bruit.  There is no jugular venous distention.  CHEST:  Clear with adequate excursion.  No wheezes, rales, or rhonchi.  Heart is a regular rhythm.  Normal S1 and S2 is heard.  No S3, S4, murmur,  click, or rub noted.  The chest wall is tender along the left parasternal  region to palpation greater than the right side.  ABDOMEN:  Soft and nontender without hepatosplenomegaly or midline pulsatile  mass.  It is obese.  PELVIC:  External genitalia is without  lesions.  RECTAL:  Not performed.  EXTREMITIES:  Full range of motion.  No edema.  Diminished distal pulses are  intact, but somewhat difficult to feel.  NEUROLOGIC:  Cranial nerves II-XII are intact.  Motor and sensory grossly  intact.  Gait not tested.  SKIN:  Warm, dry, and clear.   ACCESSORY CLINICAL DATA:  Point of care CK-MB, troponin, and myoglobin all  negative in the emergency room.  Initial CK-MB on the ward is 1.4, troponin  0.01, creatine kinase total of 123.  Serum electrolytes, BUN, creatinine,  glucose all within normal limits.  Hemoglobin 15, hematocrit 44.   Electrocardiogram:  Normal sinus rhythm, normal EKG x2.   Chest x-ray not available.   IMPRESSION:  1.  Atypical angina perhaps in an unstable pattern.  Certainly, the patient      has multiple risk factors for coronary heart disease.  2.  Of note, the patient was admitted in 2002 for chest pain.  I saw her and      did a Cardiolite study which was apparently negative.  3.  Other medical problems as listed above.   PLAN:  I have counseled the patient to undergo and she has accepted cardiac  catheterization, coronary angiography with possible PCI if indicated.  Goals, risks, and alternatives were discussed.  Patient states her  understanding, has  had her questions answered, and wishes to proceed.  The patient has already  received subcutaneous Lovenox and aspirin.  Will add topical nitroglycerin  because of her ongoing chest tightness and tobacco cessation counseling is  underway.       JHE/MEDQ  D:  10/01/2004  T:  10/01/2004  Job:  [id number]   cc:   Osvaldo Human, M.D.  [address] [place]  [place] [address]  Fax: [phone]

## 2010-07-19 NOTE — Op Note (Signed)
   NAME:  ODA, PLACKE                       ACCOUNT NO.:  [id number]   MEDICAL RECORD NO.:  [id number]                   PATIENT TYPE:  AMB   LOCATION:  ENDO                                 [facility]ACILITY[facility]   PHYSICIAN:  James L. Rolla Flatten., M.D.          DATE OF BIRTH:  03-05-1955   DATE OF PROCEDURE:  04/25/2002  DATE OF DISCHARGE:                                 OPERATIVE REPORT   PROCEDURE:  Colonoscopy.   MEDICATIONS:  The patient received Phenergan 25 mg IV preop due to nausea.  Prior to the procedure proper, she received  fentanyl 50 mcg and Versed 4 mg  IV .   SCOPE:  Olympus adult video colonoscope.   INDICATIONS FOR PROCEDURE:  Abnormal CT suggesting a narrowing in the  transverse colon. The patient has had vague abdominal pain.   DESCRIPTION OF PROCEDURE:  The procedure had been explained to the patient  and consent obtained. With the patient in the left lateral decubitus  position, the Olympus adult scope was inserted and advanced under direct  visualization. She had a somewhat floppy widely dilated colon but we are  easily able to advance to the cecum. The ileocecal valve and appendiceal  orifice were seen. The scope was withdrawn and the cecum, ascending colon,  hepatic flexure, transverse colon, splenic flexure all were completely  normal. There were no abnormalities seen in that area. The lumen was widely  patent. The descending and sigmoid colon were also widely patent with no  abnormalities. No polyps or diverticula were seen.  The rectum was free of  any significant lesions. The scope was withdrawn. The patient tolerated the  procedure well.   ASSESSMENT:  Abnormal CT, the area of abnormality was not confirmed on  colonoscopy. Apparently this must have been a spasm.   PLAN:  Will continue the patient on Protonix and see back in the office in 3  or 4 weeks.                                                James L. Rolla Flatten., M.D.    Jaynie Bream  D:   04/25/2002  T:  04/25/2002  Job:  [id number]

## 2010-07-19 NOTE — Discharge Summary (Signed)
[place]  Patient:    Katherine [name], Katherine [name]                        MRN: [id number] Adm. Date:  [id number] Disc. Date: [id number] Attending:  Chucky May Dhami                           Discharge Summary  HISTORY:  [name] is a 55 year old widowed white female who has been followed by me for quite a long time, most recently hospitalized on August 18, 1999 for acute onset of depression, hopelessness, helplessness, worthlessness, decreased sleep, decreased energy, decreased motivation, severe recurrent panic attacks to the point she could not function.  She was admitted by my colleague, Dr. Talmage Coin, and was given the diagnoses of:  DIAGNOSES: Axes I:    1. Major depressive disorder, recurrent, severe, without psychotic               features.            2. Panic disorder with agoraphobia. Axis II:   Deferred. Axis III:  History of migraine headaches and seizures as well as possible            menopause. Axis IV:   Moderate. Axis V:    30/65.  HOSPITAL COURSE:  Patient was seen by me on August 19, 1999.  She was continued on her Celexa, Xanax 0.5 mg up to six a day and her Tegretol was resumed as well, as well as tapering off of Serzone.  Patient took part in individual and group psychotherapy.  Tegretol dose was increased to 300 mg in the morning and 600 mg at bedtime.  I saw her on August 19, 1999.  She felt much better.  She was not depressed.  She was not ______ hopeless, helpless or worthless.  She was not suicidal.  Affect was slow.  Mood was euthymic and hence, she was discharged home on her Tegretol 300 mg in the morning and 600 mg at bedtime, Xanax 0.5 mg, up to six a day, Celexa 20 mg daily, hydrochlorothiazide 25 mg daily and estradiol 1 mg daily and she was advised to taper off from Serzone as instructed.  DISCHARGE DIAGNOSES: Axes I:    1. Major depressive disorder, recurrent episode, severe, without            psychotic features.      2. Panic disorder, severe, with agoraphobia. Axis II:   Deferred. Axis III:  Seizure disorder. Axis IV:   Chronic recurring illness. Axis V:    Sixty at the time of discharge.  PROGNOSIS:  Guarded. DD:  09/29/99 TD:  10/01/99 Job: [id number]
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
## 2010-07-19 NOTE — H&P (Signed)
NAME:  Katherine [name], Katherine [name]             ACCOUNT NO.:  [id number]   MEDICAL RECORD NO.:  [id number]          PATIENT TYPE:  AMB   LOCATION:  SDS                          FACILITY:  [facility]   PHYSICIAN:  Sanjeev K. Deveshwar, M.D.DATE OF BIRTH:  Nov 16, 1955   DATE OF ADMISSION:  07/15/2005  DATE OF DISCHARGE:                                HISTORY & PHYSICAL   CHIEF COMPLAINT:  Headaches and dizziness, suspected basilar artery  stenosis.   HISTORY OF PRESENT ILLNESS:  This is a 55 year old female with a 64-month history of headaches, dizziness, and visual disturbances.  The patient saw  her primary care physician, Dr. DDonnie Coffin and was referred to Dr.  CEstella Husk  She subsequently had an MRI performed on June 24, 2005.  This  showed a probable basilar artery stenosis.  The patient was referred to Dr.  DEstanislado Pandyand was seen in consultation on Jul 03, 2005.  Please see that  consult note for full details.  A decision was made to proceed with an  angiogram with possible PTA stenting of the basilar artery.  The patient  presents today for this procedure.   PAST MEDICAL HISTORY:  Significant for the above-noted headache, dizziness,  and visual disturbances.  The patient states she was recently seen in the  emergency room with dizziness.  She was told she had vertigo.  She has a  history of diabetes, hypertension, elevated cholesterol, pseudoseizures, and  migraine headaches.  The patient also has a previous history of chest pain  which was felt to be atypical.  She did undergo a cardiac catheterization on  October 01, 2004, performed by Dr. JRodell Perna  This revealed normal  coronary arteries and a normal left ventricle.   SURGICAL HISTORY:  Significant for a previous cholecystectomy, hysterectomy,  appendectomy, and back and foot surgery.   ALLERGIES:  No known drug allergies.   CURRENT MEDICATIONS:  Include clonazepam, lisinopril, metformin, Lipitor,  Zetia, Cymbalta, Estrace, Remeron,  aspirin, and Maxalt.   SOCIAL HISTORY:  The patient is widowed.  She has two sons age 3520and 395who  are alive and well.  [place]  She  smokes a pack a day and has done so for at least 30 years.  She does not  drink alcohol. She is a retired rAgricultural consultant   FAMILY HISTORY:  Her mother is alive at age 70833  She has renal failure,  hypertension, and has had a benign brain tumor resected.  Her father died at  age 751from lung cancer.  She has two sisters with hypertension.  Three  brothers, one with sarcoid and two with hypertension.   LABORATORY DATA:  An INR is 0.9, a PTT is 28.  A CBC reveals hemoglobin  14.2; hematocrit 42; WBC 16,300; platelets 259,000.  BUN is 16, creatinine  0.9, CO2 is 18, potassium 3.4, glucose is 112.   REVIEW OF SYSTEMS:  Negative except for recent headache and dizziness,  diarrhea with nausea, and the above-noted medical history.  The patient  specifically denies cough, fever, dysuria, or  diaphoresis.  The remainder of  the review of systems is negative.   PHYSICAL EXAMINATION:  GENERAL:  Reveals a pleasant 55 year old anxious  female in no acute distress.  VITAL SIGNS:  Blood pressure 117/84, pulse 107, respirations 20, temperature  97.3.  HEENT:  Unremarkable.  NECK:  Reveals no bruits, no jugular venous distention.  HEART:  Reveals regular rate and rhythm without murmur.  LUNGS:  Reveal some rhonchi with slightly decreased breath sounds but are  otherwise clear.  ABDOMEN:  Obese, soft, nontender.  EXTREMITIES:  Reveal pulses are weak but intact.  There is trace edema.  SKIN:  Warm and dry.  NEUROLOGIC:  Mental status:  The patient is alert and oriented and follows  commands.  Cranial nerves II-XII are grossly intact.  Sensation is intact to  light touch.  Motor strength is 5/5 throughout.  Cerebellar testing is  intact.  Her ASA scale is a 3.  Her airway is rated at a 3.   IMPRESSION:  1.  History of  headache, dizziness, and visual disturbances.  2.  MRI performed June 24, 2005, consistent with probable basilar artery      stenosis.  3.  History of pseudoseizures.  4.  History of migraines.  5.  Diabetes.  6.  Hypertension.  7.  Elevated cholesterol.  8.  History of normal heart catheterization in August 2006.  9.  History of anxiety and depression.  10. Ongoing tobacco use, possible chronic obstructive pulmonary disease.  11. Mild hypokalemia.  12. Elevated white blood cell count.  13. Recent nausea and diarrhea.  14. Status post multiple surgeries.   PLAN:  As noted, the patient was to undergo cerebral angiogram today with  possible PTA stenting for basilar artery stenosis.  However, she was noted  to have an elevated white blood cell count with a slight left shift.  This  was discussed with Dr. Estanislado Pandy.  He felt it would be too risky to place a  stent with a possible infection brewing.  A decision has been made to  proceed with the angiogram without intervention.  If a significant stenosis  is found, the patient will return at a later date for PTA stenting.      Mikey Bussing, P.A.    ______________________________  Fritz Pickerel. Estanislado Pandy, M.D.   DR/MEDQ  D:  07/15/2005  T:  07/15/2005  Job:  [id number]   cc:   Shaune Pascal. Estella Husk, M.D.  Fax: [phone]   L. Donnie Coffin, M.D.  Fax: [phone]

## 2010-07-26 ENCOUNTER — Emergency Department (HOSPITAL_COMMUNITY)
Admission: EM | Admit: 2010-07-26 | Discharge: 2010-07-27 | Disposition: A | Discharge status: Home / Self Care | Payer: BC Managed Care – PPO | Attending: Emergency Medicine | Admitting: Emergency Medicine

## 2010-07-26 ENCOUNTER — Emergency Department (HOSPITAL_COMMUNITY): Payer: BC Managed Care – PPO

## 2010-07-26 DIAGNOSIS — Z7982 Long term (current) use of aspirin: Secondary | ICD-10-CM | POA: Insufficient documentation

## 2010-07-26 DIAGNOSIS — R5381 Other malaise: Secondary | ICD-10-CM | POA: Insufficient documentation

## 2010-07-26 DIAGNOSIS — R1032 Left lower quadrant pain: Secondary | ICD-10-CM | POA: Insufficient documentation

## 2010-07-26 DIAGNOSIS — Z79899 Other long term (current) drug therapy: Secondary | ICD-10-CM | POA: Insufficient documentation

## 2010-07-26 DIAGNOSIS — K648 Other hemorrhoids: Secondary | ICD-10-CM | POA: Insufficient documentation

## 2010-07-26 DIAGNOSIS — R5383 Other fatigue: Secondary | ICD-10-CM | POA: Insufficient documentation

## 2010-07-26 DIAGNOSIS — E785 Hyperlipidemia, unspecified: Secondary | ICD-10-CM | POA: Insufficient documentation

## 2010-07-26 DIAGNOSIS — I10 Essential (primary) hypertension: Secondary | ICD-10-CM | POA: Insufficient documentation

## 2010-07-26 DIAGNOSIS — K6289 Other specified diseases of anus and rectum: Secondary | ICD-10-CM | POA: Insufficient documentation

## 2010-07-26 DIAGNOSIS — E119 Type 2 diabetes mellitus without complications: Secondary | ICD-10-CM | POA: Insufficient documentation

## 2010-07-26 DIAGNOSIS — K625 Hemorrhage of anus and rectum: Secondary | ICD-10-CM | POA: Insufficient documentation

## 2010-07-26 DIAGNOSIS — R110 Nausea: Secondary | ICD-10-CM | POA: Insufficient documentation

## 2010-07-26 LAB — CBC
MCH: 27.4 pg (ref 26.0–34.0)
Platelets: 184 10*3/uL (ref 150–400)
Platelets: 187 10*3/uL (ref 150–400)
RBC: 5.26 MIL/uL — ABNORMAL HIGH (ref 3.87–5.11)
RDW: 13.5 % (ref 11.5–15.5)
WBC: 12.6 10*3/uL — ABNORMAL HIGH (ref 4.0–10.5)

## 2010-07-26 LAB — URINALYSIS, ROUTINE W REFLEX MICROSCOPIC
Nitrite: NEGATIVE
Specific Gravity, Urine: 1.024 (ref 1.005–1.030)
Urobilinogen, UA: 1 mg/dL (ref 0.0–1.0)

## 2010-07-26 LAB — DIFFERENTIAL
Basophils Absolute: 0 10*3/uL (ref 0.0–0.1)
Eosinophils Absolute: 0.2 10*3/uL (ref 0.0–0.7)
Eosinophils Relative: 2 % (ref 0–5)

## 2010-07-26 LAB — OCCULT BLOOD, POC DEVICE: Fecal Occult Bld: NEGATIVE

## 2010-07-27 LAB — COMPREHENSIVE METABOLIC PANEL
ALT: 47 U/L — ABNORMAL HIGH (ref 0–35)
Calcium: 9.7 mg/dL (ref 8.4–10.5)
GFR calc Af Amer: >60 mL/min (ref >60)
Glucose, Bld: 97 mg/dL (ref 70–99)
Sodium: 137 mEq/L (ref 135–145)
Total Protein: 7.4 g/dL (ref 6.0–8.3)

## 2010-07-27 LAB — LIPASE, BLOOD: Lipase: 34 U/L (ref 11–59)

## 2010-07-27 MED ORDER — IOHEXOL 300 MG/ML  SOLN
100.0000 mL | Freq: Once | INTRAMUSCULAR | Status: AC | PRN
  Administered 2010-07-27: 100 mL via INTRAVENOUS

## 2010-08-02 ENCOUNTER — Other Ambulatory Visit: Payer: Self-pay | Admitting: Gastroenterology

## 2010-08-02 ENCOUNTER — Ambulatory Visit
Admission: RE | Admit: 2010-08-02 | Discharge: 2010-08-02 | Disposition: A | Discharge status: Home / Self Care | Payer: BC Managed Care – PPO | Source: Ambulatory Visit | Attending: Gastroenterology | Admitting: Gastroenterology

## 2010-11-27 LAB — CBC
HCT: 39
HCT: 40.5
Hemoglobin: 13.6
MCHC: 33.8
MCV: 80.7
RDW: 12.7

## 2010-11-27 LAB — CK TOTAL AND CKMB (NOT AT ARMC)
CK, MB: 0.8
CK, MB: 1
Relative Index: INVALID
Total CK: 62

## 2010-11-27 LAB — B-NATRIURETIC PEPTIDE (CONVERTED LAB): Pro B Natriuretic peptide (BNP): <30

## 2010-11-27 LAB — COMPREHENSIVE METABOLIC PANEL
ALT: 36 — ABNORMAL HIGH
AST: 30
Albumin: 3.3 — ABNORMAL LOW
Alkaline Phosphatase: 83
BUN: 15
Chloride: 111
GFR calc Af Amer: >60
Potassium: 3.8
Sodium: 142
Total Bilirubin: 0.7

## 2010-11-27 LAB — MAGNESIUM: Magnesium: 2.1

## 2011-04-24 ENCOUNTER — Other Ambulatory Visit: Payer: Self-pay | Admitting: Family Medicine

## 2011-04-24 DIAGNOSIS — R109 Unspecified abdominal pain: Secondary | ICD-10-CM

## 2011-04-25 ENCOUNTER — Ambulatory Visit
Admission: RE | Admit: 2011-04-25 | Discharge: 2011-04-25 | Disposition: A | Discharge status: Home / Self Care | Payer: BC Managed Care – PPO | Source: Ambulatory Visit | Attending: Family Medicine | Admitting: Family Medicine

## 2011-04-25 DIAGNOSIS — R109 Unspecified abdominal pain: Secondary | ICD-10-CM

## 2011-04-25 MED ORDER — IOHEXOL 300 MG/ML  SOLN
125.0000 mL | Freq: Once | INTRAMUSCULAR | Status: AC | PRN
  Administered 2011-04-25: 125 mL via INTRAVENOUS

## 2011-04-28 ENCOUNTER — Other Ambulatory Visit: Payer: BC Managed Care – PPO

## 2011-07-22 ENCOUNTER — Encounter (INDEPENDENT_AMBULATORY_CARE_PROVIDER_SITE_OTHER): Payer: Self-pay | Admitting: General Surgery

## 2011-07-23 ENCOUNTER — Encounter (INDEPENDENT_AMBULATORY_CARE_PROVIDER_SITE_OTHER): Payer: Self-pay | Admitting: General Surgery

## 2011-09-10 ENCOUNTER — Encounter (INDEPENDENT_AMBULATORY_CARE_PROVIDER_SITE_OTHER): Payer: Self-pay | Admitting: General Surgery

## 2011-09-16 ENCOUNTER — Encounter (INDEPENDENT_AMBULATORY_CARE_PROVIDER_SITE_OTHER): Payer: Self-pay

## 2011-10-02 DIAGNOSIS — Z8679 Personal history of other diseases of the circulatory system: Secondary | ICD-10-CM

## 2011-10-02 HISTORY — DX: Personal history of other diseases of the circulatory system: Z86.79

## 2011-10-26 ENCOUNTER — Encounter (HOSPITAL_BASED_OUTPATIENT_CLINIC_OR_DEPARTMENT_OTHER): Payer: Self-pay

## 2011-10-26 ENCOUNTER — Emergency Department (HOSPITAL_BASED_OUTPATIENT_CLINIC_OR_DEPARTMENT_OTHER): Payer: No Typology Code available for payment source

## 2011-10-26 ENCOUNTER — Emergency Department (HOSPITAL_BASED_OUTPATIENT_CLINIC_OR_DEPARTMENT_OTHER): Payer: BC Managed Care – PPO

## 2011-10-26 ENCOUNTER — Inpatient Hospital Stay (HOSPITAL_BASED_OUTPATIENT_CLINIC_OR_DEPARTMENT_OTHER)
Admission: EM | Admit: 2011-10-26 | Discharge: 2011-11-04 | DRG: 964 | Disposition: A | Discharge status: Home Health | Payer: No Typology Code available for payment source | Attending: General Surgery | Admitting: General Surgery

## 2011-10-26 DIAGNOSIS — S2239XA Fracture of one rib, unspecified side, initial encounter for closed fracture: Secondary | ICD-10-CM

## 2011-10-26 DIAGNOSIS — S066XAA Traumatic subarachnoid hemorrhage with loss of consciousness status unknown, initial encounter: Secondary | ICD-10-CM | POA: Diagnosis present

## 2011-10-26 DIAGNOSIS — S3600XA Unspecified injury of spleen, initial encounter: Secondary | ICD-10-CM | POA: Diagnosis present

## 2011-10-26 DIAGNOSIS — S2249XA Multiple fractures of ribs, unspecified side, initial encounter for closed fracture: Secondary | ICD-10-CM | POA: Diagnosis present

## 2011-10-26 DIAGNOSIS — S36899A Unspecified injury of other intra-abdominal organs, initial encounter: Secondary | ICD-10-CM | POA: Diagnosis present

## 2011-10-26 DIAGNOSIS — I10 Essential (primary) hypertension: Secondary | ICD-10-CM | POA: Diagnosis present

## 2011-10-26 DIAGNOSIS — S82409A Unspecified fracture of shaft of unspecified fibula, initial encounter for closed fracture: Secondary | ICD-10-CM

## 2011-10-26 DIAGNOSIS — R1319 Other dysphagia: Secondary | ICD-10-CM | POA: Diagnosis not present

## 2011-10-26 DIAGNOSIS — N318 Other neuromuscular dysfunction of bladder: Secondary | ICD-10-CM | POA: Diagnosis present

## 2011-10-26 DIAGNOSIS — Z9089 Acquired absence of other organs: Secondary | ICD-10-CM

## 2011-10-26 DIAGNOSIS — Y92410 Unspecified street and highway as the place of occurrence of the external cause: Secondary | ICD-10-CM

## 2011-10-26 DIAGNOSIS — Z90710 Acquired absence of both cervix and uterus: Secondary | ICD-10-CM

## 2011-10-26 DIAGNOSIS — F3289 Other specified depressive episodes: Secondary | ICD-10-CM | POA: Diagnosis present

## 2011-10-26 DIAGNOSIS — Z6841 Body Mass Index (BMI) 40.0 and over, adult: Secondary | ICD-10-CM

## 2011-10-26 DIAGNOSIS — Z8249 Family history of ischemic heart disease and other diseases of the circulatory system: Secondary | ICD-10-CM

## 2011-10-26 DIAGNOSIS — S32509A Unspecified fracture of unspecified pubis, initial encounter for closed fracture: Secondary | ICD-10-CM | POA: Diagnosis present

## 2011-10-26 DIAGNOSIS — G4730 Sleep apnea, unspecified: Secondary | ICD-10-CM | POA: Diagnosis present

## 2011-10-26 DIAGNOSIS — E119 Type 2 diabetes mellitus without complications: Secondary | ICD-10-CM | POA: Diagnosis present

## 2011-10-26 DIAGNOSIS — S82899A Other fracture of unspecified lower leg, initial encounter for closed fracture: Secondary | ICD-10-CM | POA: Diagnosis present

## 2011-10-26 DIAGNOSIS — S32591A Other specified fracture of right pubis, initial encounter for closed fracture: Secondary | ICD-10-CM | POA: Diagnosis present

## 2011-10-26 DIAGNOSIS — F411 Generalized anxiety disorder: Secondary | ICD-10-CM | POA: Diagnosis present

## 2011-10-26 DIAGNOSIS — IMO0002 Reserved for concepts with insufficient information to code with codable children: Secondary | ICD-10-CM | POA: Diagnosis present

## 2011-10-26 DIAGNOSIS — F17200 Nicotine dependence, unspecified, uncomplicated: Secondary | ICD-10-CM | POA: Diagnosis present

## 2011-10-26 DIAGNOSIS — E785 Hyperlipidemia, unspecified: Secondary | ICD-10-CM | POA: Diagnosis present

## 2011-10-26 DIAGNOSIS — S37819A Unspecified injury of adrenal gland, initial encounter: Secondary | ICD-10-CM | POA: Diagnosis present

## 2011-10-26 DIAGNOSIS — F329 Major depressive disorder, single episode, unspecified: Secondary | ICD-10-CM | POA: Diagnosis present

## 2011-10-26 DIAGNOSIS — S066X9A Traumatic subarachnoid hemorrhage with loss of consciousness of unspecified duration, initial encounter: Secondary | ICD-10-CM | POA: Diagnosis present

## 2011-10-26 DIAGNOSIS — Z9079 Acquired absence of other genital organ(s): Secondary | ICD-10-CM

## 2011-10-26 DIAGNOSIS — D62 Acute posthemorrhagic anemia: Secondary | ICD-10-CM | POA: Diagnosis not present

## 2011-10-26 DIAGNOSIS — S0101XA Laceration without foreign body of scalp, initial encounter: Secondary | ICD-10-CM | POA: Diagnosis present

## 2011-10-26 DIAGNOSIS — S82402A Unspecified fracture of shaft of left fibula, initial encounter for closed fracture: Secondary | ICD-10-CM | POA: Diagnosis present

## 2011-10-26 DIAGNOSIS — S37812A Contusion of adrenal gland, initial encounter: Secondary | ICD-10-CM | POA: Diagnosis present

## 2011-10-26 DIAGNOSIS — S36039A Unspecified laceration of spleen, initial encounter: Secondary | ICD-10-CM | POA: Diagnosis present

## 2011-10-26 DIAGNOSIS — I609 Nontraumatic subarachnoid hemorrhage, unspecified: Secondary | ICD-10-CM

## 2011-10-26 DIAGNOSIS — S0100XA Unspecified open wound of scalp, initial encounter: Secondary | ICD-10-CM | POA: Diagnosis present

## 2011-10-26 LAB — COMPREHENSIVE METABOLIC PANEL
ALT: 103 U/L — ABNORMAL HIGH (ref 0–35)
Alkaline Phosphatase: 78 U/L (ref 39–117)
BUN: 17 mg/dL (ref 6–23)
CO2: 20 mEq/L (ref 19–32)
Calcium: 9.2 mg/dL (ref 8.4–10.5)
GFR calc Af Amer: 81 mL/min — ABNORMAL LOW (ref >90)
GFR calc non Af Amer: 70 mL/min — ABNORMAL LOW (ref >90)
Glucose, Bld: 291 mg/dL — ABNORMAL HIGH (ref 70–99)
Sodium: 136 mEq/L (ref 135–145)

## 2011-10-26 LAB — LACTIC ACID, PLASMA: Lactic Acid, Venous: 3.3 mmol/L — ABNORMAL HIGH (ref 0.5–2.2)

## 2011-10-26 LAB — CBC
MCHC: 33.7 g/dL (ref 30.0–36.0)
RDW: 13.2 % (ref 11.5–15.5)
WBC: 27.7 10*3/uL — ABNORMAL HIGH (ref 4.0–10.5)

## 2011-10-26 LAB — PROTIME-INR: Prothrombin Time: 14.2 seconds (ref 11.6–15.2)

## 2011-10-26 MED ORDER — HYDROMORPHONE HCL PF 1 MG/ML IJ SOLN
1.0000 mg | Freq: Once | INTRAMUSCULAR | Status: AC
  Administered 2011-10-26 (×2): 1 mg via INTRAVENOUS
  Filled 2011-10-26: qty 1

## 2011-10-26 MED ORDER — TETANUS-DIPHTHERIA TOXOIDS TD 5-2 LFU IM INJ
0.5000 mL | INJECTION | Freq: Once | INTRAMUSCULAR | Status: AC
  Administered 2011-10-26: 0.5 mL via INTRAMUSCULAR
  Filled 2011-10-26: qty 0.5

## 2011-10-26 MED ORDER — HYDROMORPHONE HCL PF 1 MG/ML IJ SOLN
INTRAMUSCULAR | Status: AC
  Administered 2011-10-26: 1 mg via INTRAVENOUS
  Filled 2011-10-26: qty 1

## 2011-10-26 MED ORDER — ONDANSETRON HCL 4 MG/2ML IJ SOLN
INTRAMUSCULAR | Status: AC
  Administered 2011-10-26: 4 mg
  Filled 2011-10-26: qty 2

## 2011-10-26 MED ORDER — IOHEXOL 300 MG/ML  SOLN: 100.0000 mL | Freq: Once | INTRAMUSCULAR | Status: AC | PRN

## 2011-10-26 MED ORDER — IOHEXOL 300 MG/ML  SOLN
100.0000 mL | Freq: Once | INTRAMUSCULAR | Status: AC | PRN
  Administered 2011-10-26: 100 mL via INTRAVENOUS
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
## 2011-10-26 NOTE — ED Notes (Signed)
Patient

## 2011-10-27 ENCOUNTER — Inpatient Hospital Stay (HOSPITAL_COMMUNITY): Payer: No Typology Code available for payment source

## 2011-10-27 DIAGNOSIS — S2239XA Fracture of one rib, unspecified side, initial encounter for closed fracture: Secondary | ICD-10-CM

## 2011-10-27 DIAGNOSIS — S066X9A Traumatic subarachnoid hemorrhage with loss of consciousness of unspecified duration, initial encounter: Secondary | ICD-10-CM

## 2011-10-27 DIAGNOSIS — S066XAA Traumatic subarachnoid hemorrhage with loss of consciousness status unknown, initial encounter: Secondary | ICD-10-CM

## 2011-10-27 LAB — CBC WITH DIFFERENTIAL/PLATELET
HCT: 35.8 % — ABNORMAL LOW (ref 36.0–46.0)
Lymphocytes Relative: 7 % — ABNORMAL LOW (ref 12–46)
MCHC: 32.4 g/dL (ref 30.0–36.0)
Monocytes Absolute: 1.8 10*3/uL — ABNORMAL HIGH (ref 0.1–1.0)
Neutro Abs: 19.9 10*3/uL — ABNORMAL HIGH (ref 1.7–7.7)
Neutrophils Relative %: 85 % — ABNORMAL HIGH (ref 43–77)
Platelets: 209 10*3/uL (ref 150–400)
RDW: 13.3 % (ref 11.5–15.5)
WBC: 23.4 10*3/uL — ABNORMAL HIGH (ref 4.0–10.5)

## 2011-10-27 LAB — BASIC METABOLIC PANEL
BUN: 18 mg/dL (ref 6–23)
GFR calc non Af Amer: 60 mL/min — ABNORMAL LOW (ref >90)
Glucose, Bld: 235 mg/dL — ABNORMAL HIGH (ref 70–99)
Potassium: 3.9 mEq/L (ref 3.5–5.1)

## 2011-10-27 LAB — HEPATIC FUNCTION PANEL
ALT: 91 U/L — ABNORMAL HIGH (ref 0–35)
AST: 163 U/L — ABNORMAL HIGH (ref 0–37)
Albumin: 3.1 g/dL — ABNORMAL LOW (ref 3.5–5.2)
Alkaline Phosphatase: 71 U/L (ref 39–117)
Bilirubin, Direct: 0.1 mg/dL (ref 0.0–0.3)
Indirect Bilirubin: 0.3 mg/dL (ref 0.3–0.9)
Total Bilirubin: 0.4 mg/dL (ref 0.3–1.2)
Total Protein: 6.6 g/dL (ref 6.0–8.3)

## 2011-10-27 MED ORDER — INSULIN GLARGINE 100 UNIT/ML ~~LOC~~ SOLN
10.0000 [IU] | Freq: Every day | SUBCUTANEOUS | Status: DC
  Administered 2011-10-27 – 2011-11-03 (×9): 10 [IU] via SUBCUTANEOUS

## 2011-10-27 MED ORDER — HYDROCHLOROTHIAZIDE 25 MG PO TABS
25.0000 mg | ORAL_TABLET | Freq: Every day | ORAL | Status: DC
  Administered 2011-10-31 – 2011-11-04 (×5): 25 mg via ORAL
  Filled 2011-10-27 (×9): qty 1

## 2011-10-27 MED ORDER — INSULIN ASPART 100 UNIT/ML ~~LOC~~ SOLN
0.0000 [IU] | SUBCUTANEOUS | Status: DC
  Administered 2011-10-27: 4 [IU] via SUBCUTANEOUS
  Administered 2011-10-27: 7 [IU] via SUBCUTANEOUS
  Administered 2011-10-27: 3 [IU] via SUBCUTANEOUS
  Administered 2011-10-27: 4 [IU] via SUBCUTANEOUS
  Administered 2011-10-27: 7 [IU] via SUBCUTANEOUS
  Administered 2011-10-28 – 2011-10-29 (×6): 4 [IU] via SUBCUTANEOUS
  Administered 2011-10-29: 3 [IU] via SUBCUTANEOUS
  Administered 2011-10-29: 7 [IU] via SUBCUTANEOUS
  Administered 2011-10-29: 3 [IU] via SUBCUTANEOUS
  Administered 2011-10-29: 4 [IU] via SUBCUTANEOUS
  Administered 2011-10-30: 3 [IU] via SUBCUTANEOUS
  Administered 2011-10-30: 4 [IU] via SUBCUTANEOUS
  Administered 2011-10-30: 3 [IU] via SUBCUTANEOUS

## 2011-10-27 MED ORDER — HYDROMORPHONE HCL PF 1 MG/ML IJ SOLN
1.0000 mg | INTRAMUSCULAR | Status: DC | PRN
  Administered 2011-10-27 – 2011-10-28 (×7): 1 mg via INTRAVENOUS
  Administered 2011-10-29 – 2011-10-30 (×2): 2 mg via INTRAVENOUS
  Filled 2011-10-27 (×9): qty 1
  Filled 2011-10-27: qty 2

## 2011-10-27 MED ORDER — ONDANSETRON HCL 4 MG PO TABS
4.0000 mg | ORAL_TABLET | Freq: Four times a day (QID) | ORAL | Status: DC | PRN
  Administered 2011-11-01 – 2011-11-04 (×2): 4 mg via ORAL
  Filled 2011-10-27 (×2): qty 1
  Filled 2011-10-27: qty 0.5

## 2011-10-27 MED ORDER — VITAMIN D3 25 MCG (1000 UNIT) PO TABS
1000.0000 [IU] | ORAL_TABLET | Freq: Every day | ORAL | Status: DC
  Administered 2011-10-27 – 2011-11-04 (×9): 1000 [IU] via ORAL
  Filled 2011-10-27 (×9): qty 1

## 2011-10-27 MED ORDER — OXYCODONE-ACETAMINOPHEN 5-325 MG PO TABS
1.0000 | ORAL_TABLET | ORAL | Status: DC | PRN
  Administered 2011-10-27: 2 via ORAL
  Administered 2011-10-27: 1 via ORAL
  Administered 2011-10-28: 2 via ORAL
  Administered 2011-10-28: 1 via ORAL
  Administered 2011-10-29 – 2011-10-30 (×3): 2 via ORAL
  Filled 2011-10-27 (×2): qty 1
  Filled 2011-10-27 (×5): qty 2

## 2011-10-27 MED ORDER — PANTOPRAZOLE SODIUM 40 MG PO TBEC
40.0000 mg | DELAYED_RELEASE_TABLET | Freq: Every day | ORAL | Status: DC
  Administered 2011-10-29 – 2011-11-04 (×7): 40 mg via ORAL
  Filled 2011-10-27 (×7): qty 1

## 2011-10-27 MED ORDER — EZETIMIBE 10 MG PO TABS
10.0000 mg | ORAL_TABLET | Freq: Every day | ORAL | Status: DC
  Administered 2011-10-27 – 2011-11-04 (×9): 10 mg via ORAL
  Filled 2011-10-27 (×9): qty 1

## 2011-10-27 MED ORDER — SIMVASTATIN 40 MG PO TABS
40.0000 mg | ORAL_TABLET | Freq: Every evening | ORAL | Status: DC
  Administered 2011-10-27 – 2011-11-03 (×6): 40 mg via ORAL
  Filled 2011-10-27 (×10): qty 1

## 2011-10-27 MED ORDER — SODIUM CHLORIDE 0.9 % IV SOLN
INTRAVENOUS | Status: DC
  Administered 2011-10-27 – 2011-10-28 (×4): via INTRAVENOUS

## 2011-10-27 MED ORDER — NITROGLYCERIN 0.4 MG SL SUBL: 0.4000 mg | SUBLINGUAL_TABLET | SUBLINGUAL | Status: DC | PRN

## 2011-10-27 MED ORDER — MIRTAZAPINE 30 MG PO TABS
30.0000 mg | ORAL_TABLET | Freq: Every day | ORAL | Status: DC
  Administered 2011-10-27 – 2011-11-03 (×9): 30 mg via ORAL
  Filled 2011-10-27 (×11): qty 1

## 2011-10-27 MED ORDER — ONDANSETRON HCL 4 MG/2ML IJ SOLN
4.0000 mg | Freq: Four times a day (QID) | INTRAMUSCULAR | Status: DC | PRN
  Administered 2011-10-27 – 2011-10-31 (×5): 4 mg via INTRAVENOUS
  Filled 2011-10-27 (×5): qty 2

## 2011-10-27 MED ORDER — PANTOPRAZOLE SODIUM 40 MG IV SOLR
40.0000 mg | Freq: Every day | INTRAVENOUS | Status: DC
  Administered 2011-10-27 – 2011-10-28 (×2): 40 mg via INTRAVENOUS
  Filled 2011-10-27 (×4): qty 40

## 2011-10-27 MED ORDER — LISINOPRIL 10 MG PO TABS
10.0000 mg | ORAL_TABLET | Freq: Every day | ORAL | Status: DC
  Administered 2011-10-28 – 2011-11-04 (×6): 10 mg via ORAL
  Filled 2011-10-27 (×9): qty 1

## 2011-10-27 MED ORDER — NEBIVOLOL HCL 10 MG PO TABS
10.0000 mg | ORAL_TABLET | Freq: Every day | ORAL | Status: DC
  Administered 2011-10-31 – 2011-11-04 (×5): 10 mg via ORAL
  Filled 2011-10-27 (×9): qty 1

## 2011-10-27 MED FILL — Hydromorphone HCl Preservative Free (PF) Inj 2 MG/ML: INTRAMUSCULAR | Qty: 1 | Status: AC

## 2011-10-27 NOTE — Progress Notes (Signed)
Cervical spine cleared by PA, L lower rib pain, no abdominal tenderness. F/U LFTs P. Neuro exam GCS 15. I spoke to her mother and son. Patient examined and I agree with the assessment and plan  [name], MD, MPH, FACS Pager: [phone]  10/27/2011 11:52 AM
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
## 2011-10-27 NOTE — Progress Notes (Signed)
Patient ID: Katherine [name], female   DOB: Aug 24, 1955, 56 y.o.   MRN: [id number] Have not seen [name] yet this am, but have reviewed her films.  She does have a left ankle distal fibula/lateral malleolus fracture that appears stable, however, I will order dedicated ankle films.  Continue splint for now and NWB left ankle until further assessment.

## 2011-10-27 NOTE — ED Notes (Signed)
Patient transported to CT 
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
## 2011-10-28 ENCOUNTER — Inpatient Hospital Stay (HOSPITAL_COMMUNITY): Payer: No Typology Code available for payment source

## 2011-10-28 LAB — PROTIME-INR
INR: 1.26 (ref 0.00–1.49)
Prothrombin Time: 16.1 seconds — ABNORMAL HIGH (ref 11.6–15.2)

## 2011-10-28 LAB — CBC
HCT: 30.2 % — ABNORMAL LOW (ref 36.0–46.0)
Hemoglobin: 9.7 g/dL — ABNORMAL LOW (ref 12.0–15.0)
MCH: 27.4 pg (ref 26.0–34.0)
MCHC: 32.1 g/dL (ref 30.0–36.0)
MCV: 85.3 fL (ref 78.0–100.0)
Platelets: 126 10*3/uL — ABNORMAL LOW (ref 150–400)
RBC: 3.54 MIL/uL — ABNORMAL LOW (ref 3.87–5.11)
RDW: 13.6 % (ref 11.5–15.5)
WBC: 13.9 10*3/uL — ABNORMAL HIGH (ref 4.0–10.5)

## 2011-10-28 LAB — GLUCOSE, CAPILLARY
Glucose-Capillary: 152 mg/dL — ABNORMAL HIGH (ref 70–99)
Glucose-Capillary: 159 mg/dL — ABNORMAL HIGH (ref 70–99)
Glucose-Capillary: 174 mg/dL — ABNORMAL HIGH (ref 70–99)
Glucose-Capillary: 153 mg/dL — ABNORMAL HIGH (ref 70–99)

## 2011-10-28 LAB — HEMOGLOBIN AND HEMATOCRIT, BLOOD
HCT: 25.4 % — ABNORMAL LOW (ref 36.0–46.0)
Hemoglobin: 8.3 g/dL — ABNORMAL LOW (ref 12.0–15.0)

## 2011-10-28 LAB — ABO/RH: ABO/RH(D): AB POS

## 2011-10-28 LAB — PREPARE RBC (CROSSMATCH)

## 2011-10-28 MED ORDER — IOHEXOL 300 MG/ML  SOLN
100.0000 mL | Freq: Once | INTRAMUSCULAR | Status: AC | PRN
  Administered 2011-10-28: 100 mL via INTRAVENOUS

## 2011-10-28 NOTE — Progress Notes (Addendum)
Patient ID: Katherine [name], female   DOB: November 01, 1955, 56 y.o.   MRN: [id number] Stat CT reviewed with radiology.  Patient has a spleen lac without extravasation or blood around it.  This is visible on her initial CT but was less obvious.  She also has L adrenal hemorrhage and perisymphyseal pelvic fracture.  No significant amount of blood to account for Hb drop seen.  I will transfuse one unit as patient very fatigued.  Will keep on bedrest tonight.  I spoke with her about the plan. I callled her son Katherine [name] to give him an update too. [name], MD, MPH, FACS Pager: [phone]

## 2011-10-28 NOTE — Progress Notes (Signed)
UR complete 
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
## 2011-10-28 NOTE — Progress Notes (Signed)
Patient ID: Katherine [name], female   DOB: [birth year], 56 y.o.   MRN: [id number] Hb down to 8.3.  Not tachycardic.  Abdomen soft and nontender.  Need to repeat CT A/P in light of previous findings.  I spoke to patient.  Will also type and cross in case she needs transfusion. [name], MD, MPH, FACS Pager: [phone]

## 2011-10-28 NOTE — Progress Notes (Signed)
PT Cancellation Note    Treatment cancelled today due to pt lethargic, in a lot of pain and heading presently to CT. 10/28/2011  Donnella Sham, [place] [phone] (pager)

## 2011-10-29 ENCOUNTER — Encounter (HOSPITAL_COMMUNITY): Payer: Self-pay | Admitting: Radiology

## 2011-10-29 ENCOUNTER — Inpatient Hospital Stay (HOSPITAL_COMMUNITY): Payer: No Typology Code available for payment source

## 2011-10-29 LAB — URINE MICROSCOPIC-ADD ON

## 2011-10-29 LAB — URINALYSIS, ROUTINE W REFLEX MICROSCOPIC
Hgb urine dipstick: NEGATIVE
Protein, ur: NEGATIVE mg/dL
Urobilinogen, UA: 1 mg/dL (ref 0.0–1.0)

## 2011-10-29 LAB — CBC
HCT: 25.2 % — ABNORMAL LOW (ref 36.0–46.0)
Hemoglobin: 9.1 g/dL — ABNORMAL LOW (ref 12.0–15.0)
Hemoglobin: 8.2 g/dL — ABNORMAL LOW (ref 12.0–15.0)
MCH: 26.7 pg (ref 26.0–34.0)
MCHC: 32.5 g/dL (ref 30.0–36.0)
MCV: 83.9 fL (ref 78.0–100.0)
MCV: 83.7 fL (ref 78.0–100.0)
Platelets: 125 10*3/uL — ABNORMAL LOW (ref 150–400)
RBC: 3.41 MIL/uL — ABNORMAL LOW (ref 3.87–5.11)
RDW: 14.8 % (ref 11.5–15.5)
WBC: 12 10*3/uL — ABNORMAL HIGH (ref 4.0–10.5)

## 2011-10-29 LAB — GLUCOSE, CAPILLARY
Glucose-Capillary: 132 mg/dL — ABNORMAL HIGH (ref 70–99)
Glucose-Capillary: 138 mg/dL — ABNORMAL HIGH (ref 70–99)
Glucose-Capillary: 180 mg/dL — ABNORMAL HIGH (ref 70–99)

## 2011-10-29 MED ORDER — SODIUM CHLORIDE 0.9 % IV BOLUS (SEPSIS)
500.0000 mL | Freq: Once | INTRAVENOUS | Status: AC
  Administered 2011-10-29: 15:00:00 via INTRAVENOUS
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
## 2011-10-29 NOTE — Progress Notes (Signed)
OT / PT Cancellation Note  Treatment cancelled today due to medical issues with patient which prohibited therapy (bedrest).  Sharol Harness [place] Pager: [phone]  10/29/2011, 7:32 AM

## 2011-10-29 NOTE — Progress Notes (Signed)
Patient BP staying in MAP low 50's patient becoming more lethargic but still oriented times 4 and moves all extremities.  Dr. Hulen Skains notified and orders for 500 cc bolus Ns and stat H & H given and initiated.
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
## 2011-10-30 DIAGNOSIS — S329XXA Fracture of unspecified parts of lumbosacral spine and pelvis, initial encounter for closed fracture: Secondary | ICD-10-CM

## 2011-10-30 DIAGNOSIS — S37812A Contusion of adrenal gland, initial encounter: Secondary | ICD-10-CM | POA: Diagnosis present

## 2011-10-30 DIAGNOSIS — D62 Acute posthemorrhagic anemia: Secondary | ICD-10-CM | POA: Diagnosis not present

## 2011-10-30 DIAGNOSIS — S82402A Unspecified fracture of shaft of left fibula, initial encounter for closed fracture: Secondary | ICD-10-CM | POA: Diagnosis present

## 2011-10-30 DIAGNOSIS — S32591A Other specified fracture of right pubis, initial encounter for closed fracture: Secondary | ICD-10-CM | POA: Diagnosis present

## 2011-10-30 DIAGNOSIS — S066X9A Traumatic subarachnoid hemorrhage with loss of consciousness of unspecified duration, initial encounter: Secondary | ICD-10-CM | POA: Diagnosis present

## 2011-10-30 DIAGNOSIS — I609 Nontraumatic subarachnoid hemorrhage, unspecified: Secondary | ICD-10-CM

## 2011-10-30 DIAGNOSIS — S066XAA Traumatic subarachnoid hemorrhage with loss of consciousness status unknown, initial encounter: Secondary | ICD-10-CM | POA: Diagnosis present

## 2011-10-30 DIAGNOSIS — S36039A Unspecified laceration of spleen, initial encounter: Secondary | ICD-10-CM | POA: Diagnosis present

## 2011-10-30 DIAGNOSIS — S0101XA Laceration without foreign body of scalp, initial encounter: Secondary | ICD-10-CM | POA: Diagnosis present

## 2011-10-30 LAB — TYPE AND SCREEN
Unit division: 0
Unit division: 0

## 2011-10-30 LAB — CBC WITH DIFFERENTIAL/PLATELET
Basophils Absolute: 0.1 10*3/uL (ref 0.0–0.1)
Eosinophils Relative: 2 % (ref 0–5)
HCT: 27.9 % — ABNORMAL LOW (ref 36.0–46.0)
Lymphocytes Relative: 20 % (ref 12–46)
Lymphs Abs: 3.2 10*3/uL (ref 0.7–4.0)
MCV: 83.8 fL (ref 78.0–100.0)
Monocytes Absolute: 1.9 10*3/uL — ABNORMAL HIGH (ref 0.1–1.0)
Monocytes Relative: 12 % (ref 3–12)
RDW: 14.3 % (ref 11.5–15.5)
WBC: 15.9 10*3/uL — ABNORMAL HIGH (ref 4.0–10.5)

## 2011-10-30 LAB — BASIC METABOLIC PANEL
BUN: 19 mg/dL (ref 6–23)
CO2: 26 mEq/L (ref 19–32)
Calcium: 8.4 mg/dL (ref 8.4–10.5)
Creatinine, Ser: 0.69 mg/dL (ref 0.50–1.10)
Glucose, Bld: 152 mg/dL — ABNORMAL HIGH (ref 70–99)

## 2011-10-30 LAB — CBC
HCT: 27.7 % — ABNORMAL LOW (ref 36.0–46.0)
Hemoglobin: 9.3 g/dL — ABNORMAL LOW (ref 12.0–15.0)
MCV: 84.5 fL (ref 78.0–100.0)
Platelets: 129 10*3/uL — ABNORMAL LOW (ref 150–400)
RBC: 3.28 MIL/uL — ABNORMAL LOW (ref 3.87–5.11)
WBC: 14.7 10*3/uL — ABNORMAL HIGH (ref 4.0–10.5)

## 2011-10-30 LAB — GLUCOSE, CAPILLARY
Glucose-Capillary: 112 mg/dL — ABNORMAL HIGH (ref 70–99)
Glucose-Capillary: 122 mg/dL — ABNORMAL HIGH (ref 70–99)
Glucose-Capillary: 130 mg/dL — ABNORMAL HIGH (ref 70–99)
Glucose-Capillary: 164 mg/dL — ABNORMAL HIGH (ref 70–99)

## 2011-10-30 MED ORDER — DIPHENHYDRAMINE HCL 25 MG PO CAPS
25.0000 mg | ORAL_CAPSULE | Freq: Four times a day (QID) | ORAL | Status: DC | PRN
  Administered 2011-10-30 – 2011-11-04 (×4): 25 mg via ORAL
  Filled 2011-10-30 (×5): qty 1

## 2011-10-30 MED ORDER — INSULIN ASPART 100 UNIT/ML ~~LOC~~ SOLN
0.0000 [IU] | Freq: Three times a day (TID) | SUBCUTANEOUS | Status: DC
  Administered 2011-10-30: 3 [IU] via SUBCUTANEOUS
  Administered 2011-10-31: 4 [IU] via SUBCUTANEOUS
  Administered 2011-11-01 (×2): 3 [IU] via SUBCUTANEOUS
  Administered 2011-11-02: 4 [IU] via SUBCUTANEOUS
  Administered 2011-11-02 – 2011-11-04 (×5): 3 [IU] via SUBCUTANEOUS
  Administered 2011-11-04: 4 [IU] via SUBCUTANEOUS

## 2011-10-30 MED ORDER — TRAMADOL HCL 50 MG PO TABS
100.0000 mg | ORAL_TABLET | Freq: Four times a day (QID) | ORAL | Status: DC
  Administered 2011-10-30 – 2011-11-04 (×18): 100 mg via ORAL
  Filled 2011-10-30: qty 2
  Filled 2011-10-30: qty 1
  Filled 2011-10-30 (×2): qty 2
  Filled 2011-10-30: qty 1
  Filled 2011-10-30 (×8): qty 2
  Filled 2011-10-30: qty 1
  Filled 2011-10-30: qty 2
  Filled 2011-10-30: qty 1
  Filled 2011-10-30 (×6): qty 2

## 2011-10-30 MED ORDER — HYDROMORPHONE HCL PF 1 MG/ML IJ SOLN: 0.5000 mg | INTRAMUSCULAR | Status: DC | PRN

## 2011-10-30 MED ORDER — WHITE PETROLATUM GEL
Status: AC
  Filled 2011-10-30: qty 5

## 2011-10-30 MED ORDER — OXYCODONE HCL 5 MG PO TABS
5.0000 mg | ORAL_TABLET | ORAL | Status: DC | PRN
  Administered 2011-10-30 – 2011-10-31 (×4): 10 mg via ORAL
  Administered 2011-11-01 (×2): 15 mg via ORAL
  Administered 2011-11-01: 10 mg via ORAL
  Administered 2011-11-02 (×5): 15 mg via ORAL
  Administered 2011-11-03 – 2011-11-04 (×4): 10 mg via ORAL
  Filled 2011-10-30 (×2): qty 2
  Filled 2011-10-30: qty 3
  Filled 2011-10-30 (×2): qty 2
  Filled 2011-10-30: qty 3
  Filled 2011-10-30 (×4): qty 2
  Filled 2011-10-30 (×5): qty 3
  Filled 2011-10-30: qty 2

## 2011-10-30 NOTE — Clinical Social Work Note (Signed)
Clinical Social Worker continuing to follow patient for support and discharge needs.  Patient current discharge plan is for inpatient rehab - awaiting rehab to make official visit and determination if appropriate for program.  CSW will continue to follow and remain available as needed.  Katherine [name], [place]
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
## 2011-10-30 NOTE — Progress Notes (Signed)
Patient placed on telemetry box 5N04. 4 [place] monitor tech notified.
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
## 2011-10-30 NOTE — Progress Notes (Signed)
Hemoglobin is stable.  Will transfer to tele.  Repeat labs in AM.  This patient has been seen and I agree with the findings and treatment plan.  Kathryne Eriksson. Dahlia Bailiff, MD, [place] [phone] (pager) [phone] (direct pager) Trauma Surgeon

## 2011-10-30 NOTE — Progress Notes (Signed)
Physical Therapy Treatment Patient Details Name: Katherine [name] MRN: [id number] DOB: Jul 19, 1955 Today's Date: 10/30/2011 Time: 3435-6861 PT Time Calculation (min): 31 min  PT Assessment / Plan / Recommendation Comments on Treatment Session  Pt progressing well, able to tolerate activity today while maintaining vitals pre/post activity. Pt still with difficulty maintainining NWB during transfers. Continue per plan in future sessions for safe mobility.     Follow Up Recommendations  Inpatient Rehab    Barriers to Discharge        Equipment Recommendations  Rolling walker with 5" wheels    Recommendations for Other Services Rehab consult  Frequency Min 3X/week   Plan Discharge plan remains appropriate;Frequency remains appropriate    Precautions / Restrictions Precautions Precautions: Fall Restrictions Weight Bearing Restrictions: Yes LLE Weight Bearing: Non weight bearing   Pertinent Vitals/Pain Pt with vtach up to 145 during transfer. RN present and aware. Upon rest in chair, pt back down to low 120's.      Mobility  Bed Mobility Bed Mobility: Supine to Sit;Sitting - Scoot to Edge of Bed Supine to Sit: 4: Min assist;With rails;HOB elevated Sitting - Scoot to Edge of Bed: 4: Min guard Details for Bed Mobility Assistance: VC for sequencing. Min assist with LLE into sititng. Transfers Transfers: Sit to Stand;Stand to Sit;Stand Pivot Transfers Sit to Stand: 1: +2 Total assist;From bed;From elevated surface;With upper extremity assist Sit to Stand: Patient Percentage: 60% Stand to Sit: 1: +2 Total assist;With upper extremity assist;To chair/3-in-1 Stand to Sit: Patient Percentage: 60% Stand Pivot Transfers: 1: +2 Total assist Stand Pivot Transfers: Patient Percentage: 50% Details for Transfer Assistance: Elevated height of bed for increased assistance into standing. Pt had difficulty maintaining NWB on LLE during transfer from bed to chair, increased cueing and therapists foot  under LLE to maintain. Cueing throughout for proper sequencing and safety during transfer Ambulation/Gait Ambulation/Gait Assistance: Not tested (comment)    Exercises     PT Diagnosis:    PT Problem List:   PT Treatment Interventions:     PT Goals Acute Rehab PT Goals PT Goal: Supine/Side to Sit - Progress: Progressing toward goal PT Goal: Sit to Supine/Side - Progress: Progressing toward goal PT Goal: Sit to Stand - Progress: Progressing toward goal PT Transfer Goal: Bed to Chair/Chair to Bed - Progress: Progressing toward goal  Visit Information  Last PT Received On: 10/30/11 Assistance Needed: +2    Subjective Data      Cognition  Overall Cognitive Status: Appears within functional limits for tasks assessed/performed Arousal/Alertness: Awake/alert Orientation Level: Appears intact for tasks assessed Behavior During Session: Lethargic    Balance     End of Session PT - End of Session Equipment Utilized During Treatment: Gait belt Activity Tolerance: Patient tolerated treatment well Patient left: in chair;with call bell/phone within reach;with nursing in room Nurse Communication: Mobility status   GP     Ambrose Finland 10/30/2011, 2:08 PM

## 2011-10-31 LAB — GLUCOSE, CAPILLARY
Glucose-Capillary: 109 mg/dL — ABNORMAL HIGH (ref 70–99)
Glucose-Capillary: 133 mg/dL — ABNORMAL HIGH (ref 70–99)

## 2011-10-31 MED ORDER — POLYETHYLENE GLYCOL 3350 17 G PO PACK
17.0000 g | PACK | Freq: Every day | ORAL | Status: DC
  Administered 2011-10-31 – 2011-11-04 (×5): 17 g via ORAL
  Filled 2011-10-31 (×5): qty 1

## 2011-10-31 MED ORDER — BISACODYL 10 MG RE SUPP
10.0000 mg | Freq: Every day | RECTAL | Status: DC
  Administered 2011-10-31: 10 mg via RECTAL
  Filled 2011-10-31: qty 1

## 2011-10-31 MED ORDER — CIPROFLOXACIN HCL 500 MG PO TABS
500.0000 mg | ORAL_TABLET | Freq: Two times a day (BID) | ORAL | Status: AC
  Administered 2011-10-31 – 2011-11-03 (×6): 500 mg via ORAL
  Filled 2011-10-31 (×6): qty 1

## 2011-10-31 MED ORDER — DOCUSATE SODIUM 100 MG PO CAPS
100.0000 mg | ORAL_CAPSULE | Freq: Two times a day (BID) | ORAL | Status: DC
  Administered 2011-10-31 – 2011-11-04 (×9): 100 mg via ORAL
  Filled 2011-10-31 (×9): qty 1

## 2011-10-31 NOTE — Progress Notes (Signed)
I have begun insurance approval to admit pt to inpt rehab. Patient with nausea this morning, has not had a BM since admission. I have requested RN to obtain order for a suppository laxative if needed. I will follow up today to assess nausea , and constipation to see if pt can be admitted today. I have discussed with Trauma PA. Please call for any questions. [phone]
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
## 2011-10-31 NOTE — Progress Notes (Signed)
PT Cancellation Note  Treatment cancelled today due to medical issues with patient which prohibited therapy. Pt c/o nausea x 2 this morning . Will attempt to see pt this evening.   Katherine [name] 10/31/2011, 12:29 PM Matty Vanroekel L. Maylene Crocker DPT [phone]

## 2011-10-31 NOTE — Progress Notes (Signed)
Nausea has passed but patient keeps having episodes of feeling hot and sweating.  Urine culture shows 15k Proteus.Will treat with Cipro for 3 days.  Patient needs to feel better to participate with therapies. Patient examined and I agree with the assessment and plan  [name], MD, MPH, FACS Pager: [phone]  10/31/2011 1:05 PM

## 2011-10-31 NOTE — Progress Notes (Signed)
Physical Therapy Treatment Patient Details Name: Katherine [name] MRN: [id number] DOB: [birth year] Today's Date: 10/31/2011 Time: 9937-1696 PT Time Calculation (min): 35 min  PT Assessment / Plan / Recommendation Comments on Treatment Session  Pt ability to perform transfers improved with platform walker.     Follow Up Recommendations  Inpatient Rehab    Barriers to Discharge        Equipment Recommendations  Rolling walker with 5" wheels    Recommendations for Other Services Rehab consult  Frequency Min 3X/week   Plan Discharge plan remains appropriate;Frequency remains appropriate    Precautions / Restrictions Precautions Precautions: Fall Restrictions Weight Bearing Restrictions: Yes LLE Weight Bearing: Non weight bearing   Pertinent Vitals/Pain Pt reports no pain in L LE    Mobility  Bed Mobility Bed Mobility: Supine to Sit;Sitting - Scoot to Edge of Bed Supine to Sit: 5: Supervision Sitting - Scoot to [employer] of Bed: 5: Supervision Transfers Transfers: Sit to Stand;Stand to [employer] Transfers Sit to Stand: 3: Mod assist;With upper extremity assist;From bed (2 trials) Sit to Stand: Patient Percentage: 70% Stand to Sit: 3: Mod assist;With upper extremity assist;To chair/3-in-1;To bed Stand to Sit: Patient Percentage: 60% Stand Pivot Transfers: 1: +2 Total assist Stand Pivot Transfers: Patient Percentage: 60% Details for Transfer Assistance: Pt having difficulty maintaining NWB in LLE. Pt able to support her wt with platform walker on L UE secondary to fractured 3rd finger on left hand.    Ambulation/Gait Ambulation/Gait Assistance: Not tested (comment)    Exercises     PT Diagnosis:    PT Problem List:   PT Treatment Interventions:     PT Goals Acute Rehab PT Goals PT Goal Formulation: With patient Time For Goal Achievement: 11/12/11 Potential to Achieve Goals: Good Pt will go Supine/Side to Sit: with supervision PT Goal: Supine/Side to Sit - Progress:  Met Pt will go Sit to Supine/Side: with supervision Pt will go Sit to Stand: with supervision PT Goal: Sit to Stand - Progress: Progressing toward goal Pt will Transfer Bed to Chair/Chair to Bed: with supervision PT Transfer Goal: Bed to Chair/Chair to Bed - Progress: Progressing toward goal Pt will Ambulate: 16 - 50 feet;with supervision;with least restrictive assistive device PT Goal: Ambulate - Progress: Not progressing  Visit Information  Last PT Received On: 10/31/11    Subjective Data      Cognition  Overall Cognitive Status: Appears within functional limits for tasks assessed/performed Arousal/Alertness: Awake/alert Orientation Level: Appears intact for tasks assessed Behavior During Session: Lethargic    Balance     End of Session PT - End of Session Equipment Utilized During Treatment: Gait belt Activity Tolerance: Patient tolerated treatment well Patient left: in chair;with call bell/phone within reach;with nursing in room Nurse Communication: Mobility status   GP     Katherine [name] 10/31/2011, 3:25 PM Katherine [name] L. Maisy Newport DPT [phone]

## 2011-10-31 NOTE — Progress Notes (Signed)
I am in the appeal process to attempt approval for an inpt rehab admission. I will follow up Monday, but will likely not hear decision on the holiday. [phone]
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
## 2011-10-31 NOTE — Progress Notes (Signed)
Pt receiving nursing care, likely to d/c to CIR today. SLP will defer treatment to next setting. Herbie [place], [place] CCC-SLP [phone]

## 2011-11-01 LAB — URINE CULTURE

## 2011-11-01 LAB — GLUCOSE, CAPILLARY
Glucose-Capillary: 102 mg/dL — ABNORMAL HIGH (ref 70–99)
Glucose-Capillary: 128 mg/dL — ABNORMAL HIGH (ref 70–99)
Glucose-Capillary: 141 mg/dL — ABNORMAL HIGH (ref 70–99)
Glucose-Capillary: 124 mg/dL — ABNORMAL HIGH (ref 70–99)
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
## 2011-11-01 NOTE — Progress Notes (Signed)
Subjective:     Patient reports pain as moderate.    Objective: Vital signs in last 24 hours: Temp:  [98.1 F (36.7 C)-98.8 F (37.1 C)] 98.8 F (37.1 C) (08/31 0505) Pulse Rate:  [75-103] 75  (08/31 0505) Resp:  [18-20] 18  (08/31 0505) BP: (112-126)/(42-66) 112/66 mmHg (08/31 0505) SpO2:  [93 %-94 %] 93 % (08/31 0505)  Intake/Output from previous day: 08/30 0701 - 08/31 0700 In: 740 [P.O.:740] Out: 1050 [Urine:1050] Intake/Output this shift:     Basename 10/30/11 0455 10/30/11 0005 10/29/11 1535  HGB 9.4* 9.3* 7.4*    Basename 10/30/11 0455 10/30/11 0005  WBC 15.9* 14.7*  RBC 3.33* 3.28*  HCT 27.9* 27.7*  PLT 147* 129*    Basename 10/30/11 0455  NA 135  K 3.9  CL 102  CO2 26  BUN 19  CREATININE 0.69  GLUCOSE 152*  CALCIUM 8.4   No results found for this basename: LABPT:2,INR:2 in the last 72 hours  No cellulitis present  Assessment/Plan:     NWB  May need plate fixation  Katherine [name] C 11/01/2011, 9:26 AM

## 2011-11-01 NOTE — Progress Notes (Signed)
Orthopedic Tech Progress Note Patient Details:  Katherine [name] 19-Jan-1956 [id number]  Ortho Devices Type of Ortho Device: Other (comment) Ortho Device/Splint Location: Left short leg cast Ortho Device/Splint Interventions: Application   Irish Elders 11/01/2011, 12:35 PM

## 2011-11-01 NOTE — Progress Notes (Signed)
I have seen and examined the patient and agree with the assessment and plans.  Rhyleigh Grassel A. Ninfa Linden  MD, FACS

## 2011-11-02 LAB — CBC
Hemoglobin: 9.7 g/dL — ABNORMAL LOW (ref 12.0–15.0)
MCH: 27.9 pg (ref 26.0–34.0)
MCV: 87.9 fL (ref 78.0–100.0)
Platelets: 246 10*3/uL (ref 150–400)
RBC: 3.48 MIL/uL — ABNORMAL LOW (ref 3.87–5.11)
WBC: 13.8 10*3/uL — ABNORMAL HIGH (ref 4.0–10.5)

## 2011-11-02 LAB — GLUCOSE, CAPILLARY
Glucose-Capillary: 162 mg/dL — ABNORMAL HIGH (ref 70–99)
Glucose-Capillary: 113 mg/dL — ABNORMAL HIGH (ref 70–99)

## 2011-11-02 MED ORDER — METHOCARBAMOL 500 MG PO TABS
500.0000 mg | ORAL_TABLET | Freq: Four times a day (QID) | ORAL | Status: DC | PRN
  Administered 2011-11-02 – 2011-11-04 (×7): 500 mg via ORAL
  Filled 2011-11-02 (×6): qty 1

## 2011-11-02 NOTE — Progress Notes (Signed)
Subjective:     Patient reports pain as mild.    Objective: Vital signs in last 24 hours: Temp:  [97.8 F (36.6 C)-98.1 F (36.7 C)] 98 F (36.7 C) (09/01 0559) Pulse Rate:  [74-84] 84  (09/01 0559) Resp:  [18] 18  (09/01 0559) BP: (112-135)/(57-66) 135/66 mmHg (09/01 0559) SpO2:  [95 %-99 %] 99 % (09/01 0559)  Intake/Output from previous day: 08/31 0701 - 09/01 0700 In: 360 [P.O.:360] Out: 400 [Urine:400] Intake/Output this shift:     Basename 11/02/11 0605  HGB 9.7*    Basename 11/02/11 0605  WBC 13.8*  RBC 3.48*  HCT 30.6*  PLT 246   No results found for this basename: NA:2,K:2,CL:2,CO2:2,BUN:2,CREATININE:2,GLUCOSE:2,CALCIUM:2 in the last 72 hours No results found for this basename: LABPT:2,INR:2 in the last 72 hours  cast SL fiberglass with good fit.  she cannot lift her leg without using her hands in sitting position. deconditioned. Will make mobility much more difficult  Assessment/Plan:     Continue elevation.     Stanislawa Gaffin C 11/02/2011, 10:08 AM

## 2011-11-02 NOTE — Progress Notes (Signed)
General surgery attending note:  I have personally interviewed and examined this patient. I agree with the evaluation and treatment plan outlined by Ms. Osborne.  She remainesstable. I have discontinued cardiac monitoring, as there have been no cardiac issues or arrhythmias. Hopefully we will complete arrangements for CIR on Tuesday.   Edsel Petrin. Dalbert Batman, M.D., [facility] General and Minimally invasive Surgery Breast and Colorectal Surgery Office:   [phone] Pager:   [phone]
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
## 2011-11-03 ENCOUNTER — Inpatient Hospital Stay (HOSPITAL_COMMUNITY): Payer: BC Managed Care – PPO | Admitting: Speech Pathology

## 2011-11-03 ENCOUNTER — Inpatient Hospital Stay (HOSPITAL_COMMUNITY): Payer: BC Managed Care – PPO | Admitting: Physical Therapy

## 2011-11-03 ENCOUNTER — Encounter (HOSPITAL_COMMUNITY): Payer: BC Managed Care – PPO | Admitting: Occupational Therapy

## 2011-11-03 LAB — GLUCOSE, CAPILLARY
Glucose-Capillary: 124 mg/dL — ABNORMAL HIGH (ref 70–99)
Glucose-Capillary: 144 mg/dL — ABNORMAL HIGH (ref 70–99)

## 2011-11-03 NOTE — Clinical Social Work Placement (Addendum)
    Clinical Social Work Department CLINICAL SOCIAL WORK PLACEMENT NOTE 11/03/2011  Patient:  Katherine [name], Katherine [name]  Account Number:  [id number] Admit date:  10/26/2011  Clinical Social Worker:  Katrinka Blazing  Date/time:  11/03/2011 03:57 PM  Clinical Social Work is seeking post-discharge placement for this patient at the following level of care:   SKILLED NURSING   (*CSW will update this form in Epic as items are completed)     Patient/family provided with [place] Department of Clinical Social Work's list of facilities offering this level of care within the geographic area requested by the patient (or if unable, by the patient's family).  11/03/2011  Patient/family informed of their freedom to choose among providers that offer the needed level of care, that participate in Medicare, Medicaid or managed care program needed by the patient, have an available bed and are willing to accept the patient.    Patient/family informed of [facility]' ownership interest in [facility], as well as of the fact that they are under no obligation to receive care at this facility.  PASARR submitted to EDS on 11/03/2011 PASARR number received from EDS on   FL2 transmitted to all facilities in geographic area requested by pt/family on  11/03/2011 FL2 transmitted to all facilities within larger geographic area on   Patient informed that his/her managed care company has contracts with or will negotiate with  certain facilities, including the following:     Patient/family informed of bed offers received:   Patient chooses bed at  Physician recommends and patient chooses bed at    Patient to be transferred to  on   Patient to be transferred to facility by   The following physician request were entered in Epic:   Additional Comments:  11/04/2011 - Patient ready for inpatient rehab today.

## 2011-11-03 NOTE — Progress Notes (Signed)
Patient personally interviewed and examined by me today. I agree with the evaluation treatment plan outlined by Ms. Osborne.  According to the patient, she had her swallowing evaluation and it was normal.  Await decision regarding inpatient rehabilitation vs. SNF.   Edsel Petrin. Dalbert Batman, M.D., [facility] General and Minimally invasive Surgery Breast and Colorectal Surgery Office:   [phone] Pager:   [phone]
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
## 2011-11-03 NOTE — Progress Notes (Signed)
I am appealing insurance denial of admit to inpt rehab/CIR. I do not know that decision will me made today, a holiday. I recommend SNF search in case denial not overturned. Patient is aware of appeal. Please call me with any questions. [phone].

## 2011-11-03 NOTE — Progress Notes (Signed)
Clinical Social Worker received notification that [employer] is currently being appealed for [facility] [facility].  CSW spoke with pt to review SNF as a back up.  Pt agreeable.  Pt expressed interest in [employer], if possible, due to her niece working there.  CSW submitted appropriate information.  Service Line CSW to continue to follow and assist as needed.   Dala Dock, MSW, [place]

## 2011-11-03 NOTE — Evaluation (Signed)
SLP has reviewed and agrees with student's note below.  Orbie Pyo [place].Ed [employer] [phone]  11/03/2011

## 2011-11-03 NOTE — Progress Notes (Signed)
Physical Therapy Treatment Patient Details Name: Katherine [name] MRN: [id number] DOB: 02/05/1956 Today's Date: 11/03/2011 Time: 7544-9201 PT Time Calculation (min): 29 min  PT Assessment / Plan / Recommendation Comments on Treatment Session  Patient required assistance for sit>stand.  Patient able to perform backwards slide steps using platform walker but unable to hop fwd for ambulation while maintaining NWB restrictions.  Attempted a leg scooter however patient was unable to use due to increased pain in left thigh region and left ribs/chest area.  Patient became extremely fatigued throughout session.  Pt would be a good candidate for continued inpatient rehab.    Follow Up Recommendations  Inpatient Rehab    Barriers to Discharge        Equipment Recommendations  Rolling walker with 5" wheels    Recommendations for Other Services Rehab consult  Frequency Min 3X/week   Plan Discharge plan remains appropriate;Frequency remains appropriate    Precautions / Restrictions Precautions Precautions: Fall Restrictions Weight Bearing Restrictions: Yes LLE Weight Bearing: Non weight bearing   Pertinent Vitals/Pain No pain in lower leg reported initially, increased pain 7/10 in left lateral thigh.    Mobility  Transfers Transfers: Sit to Stand;Stand to Sit;Stand Pivot Transfers Sit to Stand: 3: Mod assist;With upper extremity assist;From chair/3-in-1 Sit to Stand: Patient Percentage: 70% Stand to Sit: 3: Mod assist;To chair/3-in-1;With upper extremity assist Stand to Sit: Patient Percentage: 70% Details for Transfer Assistance: difficulty maintaining NWB and has increased pain in left ribs Ambulation/Gait Ambulation/Gait Assistance: Not tested (comment) Assistive device: Other (Comment) (platform walker) Ambulation/Gait Assistance Details: unable to ambulate despite multiple attempts    Exercises Hand Exercises Forearm Supination: AAROM;PROM;Left;10 reps;Seated Digit Composite  Flexion: AROM;AAROM;Left;10 reps;Seated Composite Extension: AROM;AAROM;Left;Seated;10 reps Digit Composite Abduction: AROM;AAROM;Left;10 reps;Seated   PT Diagnosis:    PT Problem List:   PT Treatment Interventions:     PT Goals Acute Rehab PT Goals PT Goal: Sit to Stand - Progress: Progressing toward goal PT Transfer Goal: Bed to Chair/Chair to Bed - Progress: Not progressing  Visit Information  Last PT Received On: 11/03/11 Assistance Needed: +2    Subjective Data  Subjective: I'm having new pain in the outside of my left thigh   Cognition  Overall Cognitive Status: Appears within functional limits for tasks assessed/performed Area of Impairment: Problem solving Arousal/Alertness: Awake/alert Orientation Level: Appears intact for tasks assessed Behavior During Session: [facility] for tasks performed Problem Solving: decreased complex functioning       End of Session PT - End of Session Equipment Utilized During Treatment: Gait belt Activity Tolerance: Patient tolerated treatment well;Patient limited by fatigue;Patient limited by pain Patient left: in chair;with call bell/phone within reach;with nursing in room Nurse Communication: Mobility status   GP     Duncan Dull 11/03/2011, 2:29 PM Alben Deeds, [place] DPT  [phone]

## 2011-11-03 NOTE — Progress Notes (Signed)
Patient ID: Katherine [name], female   DOB: 27-Apr-1955, 56 y.o.   MRN: [id number] Cast fit good. C/O  Left thigh pain and soreness where she has thigh ecymosis  . Previous femur xrays,   Left  Were neg and have been reviewed again.
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
## 2011-11-04 ENCOUNTER — Inpatient Hospital Stay (HOSPITAL_COMMUNITY)
Admission: RE | Admit: 2011-11-04 | Discharge: 2011-11-13 | DRG: 945 | Disposition: A | Discharge status: Home Health | Payer: No Typology Code available for payment source | Source: Ambulatory Visit | Attending: Physical Medicine & Rehabilitation | Admitting: Physical Medicine & Rehabilitation

## 2011-11-04 ENCOUNTER — Inpatient Hospital Stay (HOSPITAL_COMMUNITY): Payer: No Typology Code available for payment source

## 2011-11-04 DIAGNOSIS — F3289 Other specified depressive episodes: Secondary | ICD-10-CM

## 2011-11-04 DIAGNOSIS — S2239XA Fracture of one rib, unspecified side, initial encounter for closed fracture: Secondary | ICD-10-CM

## 2011-11-04 DIAGNOSIS — Z79899 Other long term (current) drug therapy: Secondary | ICD-10-CM

## 2011-11-04 DIAGNOSIS — S82899A Other fracture of unspecified lower leg, initial encounter for closed fracture: Secondary | ICD-10-CM

## 2011-11-04 DIAGNOSIS — F411 Generalized anxiety disorder: Secondary | ICD-10-CM

## 2011-11-04 DIAGNOSIS — F329 Major depressive disorder, single episode, unspecified: Secondary | ICD-10-CM

## 2011-11-04 DIAGNOSIS — S069XAA Unspecified intracranial injury with loss of consciousness status unknown, initial encounter: Secondary | ICD-10-CM

## 2011-11-04 DIAGNOSIS — S066X9A Traumatic subarachnoid hemorrhage with loss of consciousness of unspecified duration, initial encounter: Secondary | ICD-10-CM

## 2011-11-04 DIAGNOSIS — S8263XA Displaced fracture of lateral malleolus of unspecified fibula, initial encounter for closed fracture: Secondary | ICD-10-CM

## 2011-11-04 DIAGNOSIS — E669 Obesity, unspecified: Secondary | ICD-10-CM

## 2011-11-04 DIAGNOSIS — E119 Type 2 diabetes mellitus without complications: Secondary | ICD-10-CM

## 2011-11-04 DIAGNOSIS — Z6841 Body Mass Index (BMI) 40.0 and over, adult: Secondary | ICD-10-CM

## 2011-11-04 DIAGNOSIS — I10 Essential (primary) hypertension: Secondary | ICD-10-CM

## 2011-11-04 DIAGNOSIS — G4733 Obstructive sleep apnea (adult) (pediatric): Secondary | ICD-10-CM

## 2011-11-04 DIAGNOSIS — IMO0002 Reserved for concepts with insufficient information to code with codable children: Secondary | ICD-10-CM

## 2011-11-04 DIAGNOSIS — S0100XA Unspecified open wound of scalp, initial encounter: Secondary | ICD-10-CM

## 2011-11-04 DIAGNOSIS — Z5189 Encounter for other specified aftercare: Secondary | ICD-10-CM

## 2011-11-04 DIAGNOSIS — S32509A Unspecified fracture of unspecified pubis, initial encounter for closed fracture: Secondary | ICD-10-CM

## 2011-11-04 DIAGNOSIS — S329XXA Fracture of unspecified parts of lumbosacral spine and pelvis, initial encounter for closed fracture: Secondary | ICD-10-CM

## 2011-11-04 DIAGNOSIS — T1490XA Injury, unspecified, initial encounter: Secondary | ICD-10-CM

## 2011-11-04 DIAGNOSIS — D62 Acute posthemorrhagic anemia: Secondary | ICD-10-CM

## 2011-11-04 DIAGNOSIS — S37819A Unspecified injury of adrenal gland, initial encounter: Secondary | ICD-10-CM

## 2011-11-04 DIAGNOSIS — Z7982 Long term (current) use of aspirin: Secondary | ICD-10-CM

## 2011-11-04 DIAGNOSIS — E785 Hyperlipidemia, unspecified: Secondary | ICD-10-CM

## 2011-11-04 DIAGNOSIS — F17200 Nicotine dependence, unspecified, uncomplicated: Secondary | ICD-10-CM

## 2011-11-04 DIAGNOSIS — S3609XA Other injury of spleen, initial encounter: Secondary | ICD-10-CM

## 2011-11-04 DIAGNOSIS — S2249XA Multiple fractures of ribs, unspecified side, initial encounter for closed fracture: Secondary | ICD-10-CM

## 2011-11-04 DIAGNOSIS — R32 Unspecified urinary incontinence: Secondary | ICD-10-CM

## 2011-11-04 DIAGNOSIS — R0902 Hypoxemia: Secondary | ICD-10-CM

## 2011-11-04 DIAGNOSIS — S069X9A Unspecified intracranial injury with loss of consciousness of unspecified duration, initial encounter: Secondary | ICD-10-CM

## 2011-11-04 DIAGNOSIS — Y92410 Unspecified street and highway as the place of occurrence of the external cause: Secondary | ICD-10-CM

## 2011-11-04 LAB — CBC
HCT: 32.6 % — ABNORMAL LOW (ref 36.0–46.0)
Hemoglobin: 10.2 g/dL — ABNORMAL LOW (ref 12.0–15.0)
MCH: 27.6 pg (ref 26.0–34.0)
MCHC: 31.3 g/dL (ref 30.0–36.0)
MCV: 88.3 fL (ref 78.0–100.0)
Platelets: 267 10*3/uL (ref 150–400)
RBC: 3.69 MIL/uL — ABNORMAL LOW (ref 3.87–5.11)
RDW: 15.7 % — ABNORMAL HIGH (ref 11.5–15.5)
WBC: 12 10*3/uL — ABNORMAL HIGH (ref 4.0–10.5)

## 2011-11-04 LAB — GLUCOSE, CAPILLARY
Glucose-Capillary: 123 mg/dL — ABNORMAL HIGH (ref 70–99)
Glucose-Capillary: 178 mg/dL — ABNORMAL HIGH (ref 70–99)

## 2011-11-04 MED ORDER — EZETIMIBE 10 MG PO TABS
10.0000 mg | ORAL_TABLET | Freq: Every day | ORAL | Status: DC
  Administered 2011-11-05 – 2011-11-13 (×9): 10 mg via ORAL
  Filled 2011-11-04 (×12): qty 1

## 2011-11-04 MED ORDER — METHOCARBAMOL 500 MG PO TABS
500.0000 mg | ORAL_TABLET | Freq: Four times a day (QID) | ORAL | Status: DC | PRN
  Administered 2011-11-04 – 2011-11-13 (×14): 500 mg via ORAL
  Filled 2011-11-04 (×15): qty 1

## 2011-11-04 MED ORDER — PANTOPRAZOLE SODIUM 40 MG PO TBEC
40.0000 mg | DELAYED_RELEASE_TABLET | Freq: Every day | ORAL | Status: DC
  Administered 2011-11-05 – 2011-11-13 (×9): 40 mg via ORAL
  Filled 2011-11-04 (×9): qty 1

## 2011-11-04 MED ORDER — ACETAMINOPHEN 325 MG PO TABS
325.0000 mg | ORAL_TABLET | ORAL | Status: DC | PRN
  Administered 2011-11-05: 650 mg via ORAL
  Filled 2011-11-04: qty 2

## 2011-11-04 MED ORDER — DIPHENHYDRAMINE HCL 50 MG/ML IJ SOLN
INTRAMUSCULAR | Status: AC
  Filled 2011-11-04: qty 1

## 2011-11-04 MED ORDER — ONDANSETRON HCL 4 MG PO TABS
4.0000 mg | ORAL_TABLET | Freq: Four times a day (QID) | ORAL | Status: DC | PRN
  Administered 2011-11-07 – 2011-11-12 (×3): 4 mg via ORAL
  Filled 2011-11-04 (×4): qty 1

## 2011-11-04 MED ORDER — INSULIN ASPART 100 UNIT/ML ~~LOC~~ SOLN
0.0000 [IU] | Freq: Three times a day (TID) | SUBCUTANEOUS | Status: DC
  Administered 2011-11-04: 4 [IU] via SUBCUTANEOUS
  Administered 2011-11-05 – 2011-11-06 (×3): 3 [IU] via SUBCUTANEOUS
  Administered 2011-11-07: 4 [IU] via SUBCUTANEOUS
  Administered 2011-11-07 – 2011-11-11 (×5): 3 [IU] via SUBCUTANEOUS

## 2011-11-04 MED ORDER — NITROGLYCERIN 0.4 MG SL SUBL: 0.4000 mg | SUBLINGUAL_TABLET | SUBLINGUAL | Status: DC | PRN

## 2011-11-04 MED ORDER — LISINOPRIL 10 MG PO TABS
10.0000 mg | ORAL_TABLET | Freq: Every day | ORAL | Status: DC
  Administered 2011-11-05 – 2011-11-10 (×5): 10 mg via ORAL
  Filled 2011-11-04 (×8): qty 1

## 2011-11-04 MED ORDER — HYDROCHLOROTHIAZIDE 25 MG PO TABS
25.0000 mg | ORAL_TABLET | Freq: Every day | ORAL | Status: DC
  Administered 2011-11-05 – 2011-11-10 (×5): 25 mg via ORAL
  Filled 2011-11-04 (×8): qty 1

## 2011-11-04 MED ORDER — MOXIFLOXACIN HCL 400 MG PO TABS
400.0000 mg | ORAL_TABLET | Freq: Every day | ORAL | Status: DC
  Administered 2011-11-04 – 2011-11-09 (×6): 400 mg via ORAL
  Filled 2011-11-04 (×7): qty 1

## 2011-11-04 MED ORDER — MIRTAZAPINE 30 MG PO TABS
30.0000 mg | ORAL_TABLET | Freq: Every day | ORAL | Status: DC
  Administered 2011-11-04 – 2011-11-12 (×9): 30 mg via ORAL
  Filled 2011-11-04 (×10): qty 1

## 2011-11-04 MED ORDER — POLYETHYLENE GLYCOL 3350 17 G PO PACK
17.0000 g | PACK | Freq: Every day | ORAL | Status: DC
  Administered 2011-11-06 – 2011-11-12 (×7): 17 g via ORAL
  Filled 2011-11-04 (×12): qty 1

## 2011-11-04 MED ORDER — OXYCODONE HCL 5 MG PO TABS
10.0000 mg | ORAL_TABLET | ORAL | Status: DC | PRN
  Administered 2011-11-04 – 2011-11-13 (×20): 10 mg via ORAL
  Filled 2011-11-04 (×20): qty 2

## 2011-11-04 MED ORDER — NEBIVOLOL HCL 10 MG PO TABS
10.0000 mg | ORAL_TABLET | Freq: Every day | ORAL | Status: DC
  Administered 2011-11-05 – 2011-11-13 (×8): 10 mg via ORAL
  Filled 2011-11-04 (×11): qty 1

## 2011-11-04 MED ORDER — ONDANSETRON HCL 4 MG/2ML IJ SOLN: 4.0000 mg | Freq: Four times a day (QID) | INTRAMUSCULAR | Status: DC | PRN

## 2011-11-04 MED ORDER — SIMVASTATIN 40 MG PO TABS
40.0000 mg | ORAL_TABLET | Freq: Every evening | ORAL | Status: DC
  Administered 2011-11-04 – 2011-11-12 (×9): 40 mg via ORAL
  Filled 2011-11-04 (×10): qty 1

## 2011-11-04 MED ORDER — VITAMIN D3 25 MCG (1000 UNIT) PO TABS
1000.0000 [IU] | ORAL_TABLET | Freq: Every day | ORAL | Status: DC
  Administered 2011-11-05 – 2011-11-13 (×9): 1000 [IU] via ORAL
  Filled 2011-11-04 (×11): qty 1

## 2011-11-04 MED ORDER — INSULIN GLARGINE 100 UNIT/ML ~~LOC~~ SOLN
10.0000 [IU] | Freq: Every day | SUBCUTANEOUS | Status: DC
  Administered 2011-11-04 – 2011-11-12 (×9): 10 [IU] via SUBCUTANEOUS

## 2011-11-04 NOTE — Progress Notes (Signed)
UR complete 

## 2011-11-04 NOTE — Progress Notes (Signed)
Physical Therapy Treatment Patient Details Name: Katherine [name] MRN: [id number] DOB: 02-08-1956 Today's Date: 11/04/2011 Time: 0840-0900 PT Time Calculation (min): 20 min  PT Assessment / Plan / Recommendation Comments on Treatment Session  Pt demonstrates decreased functional mobility and difficulty maintaining NWB retrictions during transfers.  Pt will continue to benefit from PT to improve mobility and activity tolerance.     Follow Up Recommendations  Inpatient Rehab    Barriers to Discharge        Equipment Recommendations  Rolling walker with 5" wheels    Recommendations for Other Services Rehab consult  Frequency Min 3X/week   Plan Discharge plan remains appropriate;Frequency remains appropriate    Precautions / Restrictions Precautions Precautions: Fall Restrictions Weight Bearing Restrictions: Yes LLE Weight Bearing: Non weight bearing   Pertinent Vitals/Pain 3/10    Mobility  Bed Mobility Bed Mobility: Supine to Sit;Sitting - Scoot to Edge of Bed Supine to Sit: 5: Supervision Sitting - Scoot to [employer] of Bed: 5: Supervision Transfers Transfers: Sit to Stand;Stand to [employer] Transfers Sit to Stand: 4: Min assist;With upper extremity assist Sit to Stand: Patient Percentage: 90% Stand to Sit: 4: Min assist;With upper extremity assist Stand to Sit: Patient Percentage: 90% Stand Pivot Transfers: 4: Min assist;From elevated surface Details for Transfer Assistance: Pt requires VCs for hand placement and body positioning.  Pt having difficullty maintaining NWBing during Stand pivot transfer.  Pt requires assist with walker stabilization.   Ambulation/Gait Ambulation/Gait Assistance: Not tested (comment)      PT Goals Acute Rehab PT Goals PT Goal: Sit to Stand - Progress: Progressing toward goal PT Transfer Goal: Bed to Chair/Chair to Bed - Progress: Progressing toward goal PT Goal: Ambulate - Progress: Not progressing  Visit Information  Last PT  Received On: 11/04/11 Assistance Needed: +2    Subjective Data  Subjective: My lower leg is doing better but my knee and thigh are hurting Patient Stated Goal: to be able to walk    Cognition  Overall Cognitive Status: Appears within functional limits for tasks assessed/performed Arousal/Alertness: Awake/alert Orientation Level: Appears intact for tasks assessed Behavior During Session: [facility] for tasks performed Cognition - Other Comments: Pt reports having difficulty with short term memory    Balance     End of Session PT - End of Session Equipment Utilized During Treatment: Gait belt Activity Tolerance: Patient tolerated treatment well;Patient limited by pain;Patient limited by fatigue Patient left: in chair;with call bell/phone within reach;with nursing in room Nurse Communication: Mobility status   GP     Duncan Dull 11/04/2011, 9:13 AM Alben Deeds, [place] DPT  [phone]
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
## 2011-11-04 NOTE — Clinical Social Work Note (Signed)
Clinical Social Worker following patient for possible placement needs.  Per inpatient rehab admissions coordinator, patient MD completed the appeal process with patient insurance and patient is now approved for inpatient rehab admission.  Plan is for patient to discharge today to CIR.    Clinical Social Worker will sign off for now as social work intervention is no longer needed. Please consult [name] again if new need arises.  Barbette Or, [place]
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
## 2011-11-04 NOTE — Progress Notes (Signed)
SATs good, OK to go to CIR Patient examined and I agree with the assessment and plan  [name], MD, MPH, FACS Pager: [phone]  11/04/2011 2:02 PM

## 2011-11-04 NOTE — Progress Notes (Signed)
Speech Language Pathology Dysphagia Treatment Patient Details Name: Katherine [name] MRN: [id number] DOB: Apr 27, 1955 Today's Date: 11/04/2011 Time: 2248-2500 SLP Time Calculation (min): 10 min  Assessment / Plan / Recommendation Clinical Impression  F/u with pt after yesterday's swallow assessment.  Pt reports no symptoms of globus subsequent to incident precipitating swallow referral.  She was observed with consumption of lunch and tolerated well with no esophageal symptoms - pt verbalizes that issue was likely isolated incident.  For transfer to CIR this afternoon - f/u for swallowing no longer warranted.  Rec continued f/u for higher-level cognitive deficits.      Diet Recommendation  Continue with Current Diet: Regular;Thin liquid    SLP Plan   D/C to CIR today     Swallowing Goals  SLP Swallowing Goals Patient will consume recommended diet without observed clinical signs of aspiration with: Independent assistance Swallow Study Goal #1 - Progress: Met Patient will utilize recommended strategies during swallow to increase swallowing safety with: Independent assistance Swallow Study Goal #2 - Progress: Met  General Temperature Spikes Noted: No Respiratory Status: Room air Behavior/Cognition: Alert;Cooperative;Pleasant mood Oral Cavity - Dentition: Adequate natural dentition Patient Positioning: Upright in chair  Oral Cavity - Oral Hygiene   wnl  Dysphagia Treatment Treatment focused on: Skilled observation of diet tolerance Treatment Methods/Modalities: Skilled observation Patient observed directly with PO's: Yes Type of PO's observed: Regular;Thin liquids Feeding: Able to feed self Liquids provided via: Straw Type of cueing:  (none) Amount of cueing:  (none)  Cailyn Houdek L. Tivis Ringer, [place] CCC/SLP Pager [phone]      Juan Quam Laurice 11/04/2011, 12:22 PM

## 2011-11-05 ENCOUNTER — Inpatient Hospital Stay (HOSPITAL_COMMUNITY): Payer: No Typology Code available for payment source | Admitting: Physical Therapy

## 2011-11-05 ENCOUNTER — Inpatient Hospital Stay (HOSPITAL_COMMUNITY): Payer: No Typology Code available for payment source | Admitting: Speech Pathology

## 2011-11-05 ENCOUNTER — Inpatient Hospital Stay (HOSPITAL_COMMUNITY): Payer: No Typology Code available for payment source | Admitting: Occupational Therapy

## 2011-11-05 ENCOUNTER — Inpatient Hospital Stay (HOSPITAL_COMMUNITY): Payer: BC Managed Care – PPO | Admitting: *Deleted

## 2011-11-05 DIAGNOSIS — S069XAA Unspecified intracranial injury with loss of consciousness status unknown, initial encounter: Secondary | ICD-10-CM

## 2011-11-05 DIAGNOSIS — S8263XA Displaced fracture of lateral malleolus of unspecified fibula, initial encounter for closed fracture: Secondary | ICD-10-CM

## 2011-11-05 DIAGNOSIS — S2239XA Fracture of one rib, unspecified side, initial encounter for closed fracture: Secondary | ICD-10-CM

## 2011-11-05 DIAGNOSIS — T1490XA Injury, unspecified, initial encounter: Secondary | ICD-10-CM

## 2011-11-05 DIAGNOSIS — M79609 Pain in unspecified limb: Secondary | ICD-10-CM

## 2011-11-05 DIAGNOSIS — S329XXA Fracture of unspecified parts of lumbosacral spine and pelvis, initial encounter for closed fracture: Secondary | ICD-10-CM

## 2011-11-05 DIAGNOSIS — Z5189 Encounter for other specified aftercare: Secondary | ICD-10-CM

## 2011-11-05 DIAGNOSIS — S069X9A Unspecified intracranial injury with loss of consciousness of unspecified duration, initial encounter: Secondary | ICD-10-CM

## 2011-11-05 DIAGNOSIS — M7989 Other specified soft tissue disorders: Secondary | ICD-10-CM

## 2011-11-05 LAB — COMPREHENSIVE METABOLIC PANEL
AST: 27 U/L (ref 0–37)
CO2: 33 mEq/L — ABNORMAL HIGH (ref 19–32)
Calcium: 9.3 mg/dL (ref 8.4–10.5)
Creatinine, Ser: 0.85 mg/dL (ref 0.50–1.10)
GFR calc Af Amer: 87 mL/min — ABNORMAL LOW (ref >90)
GFR calc non Af Amer: 75 mL/min — ABNORMAL LOW (ref >90)
Total Protein: 6.1 g/dL (ref 6.0–8.3)

## 2011-11-05 LAB — GLUCOSE, CAPILLARY: Glucose-Capillary: 94 mg/dL (ref 70–99)

## 2011-11-05 LAB — CBC WITH DIFFERENTIAL/PLATELET
Basophils Absolute: 0 10*3/uL (ref 0.0–0.1)
Eosinophils Absolute: 0.3 10*3/uL (ref 0.0–0.7)
Eosinophils Relative: 3 % (ref 0–5)
HCT: 31.4 % — ABNORMAL LOW (ref 36.0–46.0)
Lymphocytes Relative: 21 % (ref 12–46)
MCH: 27.7 pg (ref 26.0–34.0)
MCHC: 31.2 g/dL (ref 30.0–36.0)
MCV: 88.7 fL (ref 78.0–100.0)
Monocytes Absolute: 1.2 10*3/uL — ABNORMAL HIGH (ref 0.1–1.0)
RDW: 16.1 % — ABNORMAL HIGH (ref 11.5–15.5)
WBC: 10.7 10*3/uL — ABNORMAL HIGH (ref 4.0–10.5)

## 2011-11-05 MED ORDER — FE FUMARATE-B12-VIT C-FA-IFC PO CAPS
1.0000 | ORAL_CAPSULE | Freq: Three times a day (TID) | ORAL | Status: DC
  Administered 2011-11-05 – 2011-11-12 (×21): 1 via ORAL
  Filled 2011-11-05 (×31): qty 1

## 2011-11-05 MED ORDER — OXYCODONE HCL 10 MG PO TB12
10.0000 mg | ORAL_TABLET | Freq: Two times a day (BID) | ORAL | Status: DC
  Administered 2011-11-05 – 2011-11-06 (×3): 10 mg via ORAL
  Filled 2011-11-05 (×3): qty 1

## 2011-11-05 NOTE — Progress Notes (Signed)
Recreational Therapy Session Note  Patient Details  Name: Katherine [name] MRN: [id number] Date of Birth: 06/05/1955 Today's Date: 11/05/2011  Pain: no c/o Skilled Therapeutic Interventions/Progress Updates: Pt participated in animal assisted activity/therapy seated w/c level with superviison.    Jazzmin Newbold 11/05/2011, 5:45 PM
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
## 2011-11-05 NOTE — Progress Notes (Signed)
Have collaborated with this note. 11/05/2011  Donnella Sham, [place] [phone] (pager)

## 2011-11-05 NOTE — Progress Notes (Signed)
Occupational Therapy Session Note  Patient Details  Name: Katherine [name] MRN: [id number] Date of Birth: 06/11/1955  Today's Date: 11/05/2011 Time: 1451 -1533 Time Calculation (min): 42 min   Short Term Goals: Week 1:  OT Short Term Goal 1 (Week 1): Pt will perform LB bathing with min asisst sit to stand 2/3 sessons. OT Short Term Goal 2 (Week 1): Pt will perform LB dressing with AE PRN and min assist 2/3 sessions. OT Short Term Goal 3 (Week 1): Pt will perform toilet transfer with min assist to bedside toilet. OT Short Term Goal 4 (Week 1): Pt will perform shower transfer with min assist to tub seat/bench using RW with platform.  Skilled Therapeutic Interventions/Progress Updates:   Practiced wheelchair to bed transfers stand pivot with RW and platform on the left side.  Pt needing mod assist with mod instructional cueing for hand placement and technique.  Unable to accurately maintain NWBing over the LLE and placed on the ground during the transfers.  Also discussed use of leg lifter or sheet secondary to pt not being able to lift her LLE in or out of the bed without assistance.  Pt unable to take steps during transfers and continues to rely on heel/toe transition.    Therapy Documentation Precautions:  Precautions Precautions: Fall Restrictions Weight Bearing Restrictions: Yes LLE Weight Bearing: Non weight bearing Other Position/Activity Restrictions: Pt with cast on the LLE  Pain: Pain Assessment Pain Assessment: No/denies pain ADL: See FIM for current functional status  Therapy/Group: Individual Therapy  Danyale Ridinger OTR/L 11/05/2011, 4:05 PM

## 2011-11-05 NOTE — Progress Notes (Signed)
Patient information reviewed and entered into [employer] system by Jatoya Armbrister, RN, CRRN, PPS Coordinator.  Information including medical coding and functional independence measure will be reviewed and updated through discharge.    

## 2011-11-05 NOTE — Progress Notes (Signed)
*  PRELIMINARY RESULTS* Vascular Ultrasound Lower extremity venous duplex has been completed.  Preliminary findings: Right= Non-compressible peroneal veins. Difficult to clearly evaluate to verify thrombus in these veins. Left= no evidence of DVT in visualized veins. Cannot evaluate posterior tibial and peroneal veins due to leg cast. Therefore DVT cannot be ruled out in this area. Bilateral areas of fluid collection in the mid thigh and popliteal fossa areas.  Landry Mellow, RDMS, RVT  11/05/2011, 12:36 PM
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
## 2011-11-06 ENCOUNTER — Inpatient Hospital Stay (HOSPITAL_COMMUNITY): Payer: No Typology Code available for payment source

## 2011-11-06 ENCOUNTER — Inpatient Hospital Stay (HOSPITAL_COMMUNITY): Payer: No Typology Code available for payment source | Admitting: Physical Therapy

## 2011-11-06 LAB — GLUCOSE, CAPILLARY: Glucose-Capillary: 111 mg/dL — ABNORMAL HIGH (ref 70–99)

## 2011-11-06 LAB — CBC
Hemoglobin: 10.7 g/dL — ABNORMAL LOW (ref 12.0–15.0)
MCH: 27.3 pg (ref 26.0–34.0)
MCV: 88.5 fL (ref 78.0–100.0)
RBC: 3.92 MIL/uL (ref 3.87–5.11)

## 2011-11-06 MED ORDER — OXYCODONE HCL 10 MG PO TB12
20.0000 mg | ORAL_TABLET | Freq: Two times a day (BID) | ORAL | Status: DC
  Administered 2011-11-06 – 2011-11-13 (×14): 20 mg via ORAL
  Filled 2011-11-06 (×4): qty 2
  Filled 2011-11-06: qty 1
  Filled 2011-11-06: qty 2
  Filled 2011-11-06: qty 1
  Filled 2011-11-06 (×3): qty 2
  Filled 2011-11-06: qty 1
  Filled 2011-11-06 (×2): qty 2
  Filled 2011-11-06: qty 1
  Filled 2011-11-06 (×2): qty 2
  Filled 2011-11-06: qty 1

## 2011-11-06 MED ORDER — OXYCODONE HCL 10 MG PO TB12
10.0000 mg | ORAL_TABLET | Freq: Once | ORAL | Status: AC
  Administered 2011-11-06: 10 mg via ORAL

## 2011-11-06 NOTE — Progress Notes (Signed)
Inpatient [facility] Individual Statement of Services  Patient Name:  Katherine [name]  Date:  11/06/2011  Welcome to the [facility].  Our goal is to provide you with an individualized program based on your diagnosis and situation, designed to meet your specific needs.  With this comprehensive rehabilitation program, you will be expected to participate in at least 3 hours of rehabilitation therapies Monday-Friday, with modified therapy programming on the weekends.  Your rehabilitation program will include the following services:  Physical Therapy (PT), Occupational Therapy (OT), Speech Therapy (ST), 24 hour per day rehabilitation nursing, Therapeutic Recreaction (TR), Neuropsychology, Case Management (Social Worker), Rehabilitation Medicine, Nutrition Services and Pharmacy Services  Weekly team conferences will be held on Tuesdays to discuss your progress.  Your  Social Worker will talk with you frequently to get your input and to update you on team discussions.  Team conferences with you and your family in attendance may also be held.  Expected length of stay: 10-14 days  Overall anticipated outcome: supervision to modified independent  Depending on your progress and recovery, your program may change.  Your  Social Worker will coordinate services and will keep you informed of any changes.  Your  Social Worker's name and contact numbers are listed  below.  The following services may also be recommended but are not provided by the [facility] will be made to provide these services after discharge if needed.  Arrangements include referral to agencies that provide these services.  Your insurance has been verified to be:  [place]  Your primary doctor is:  Dr. [name]  Pertinent information will be  shared with your doctor and your insurance company.  Social Worker:  [place], [place] or [phone]  Information discussed with and copy given to patient by: Lennart Pall, 11/06/2011, 3:16 PM
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
## 2011-11-06 NOTE — Progress Notes (Signed)
Physical Therapy Session Note  Patient Details  Name: Katherine [name] MRN: [id number] Date of Birth: 12/05/1955  Today's Date: 11/06/2011 Time: 0730-0828 Time Calculation (min): 58 min  Short Term Goals: Week 1:  PT Short Term Goal 1 (Week 1): Same as LTGs  Skilled Therapeutic Interventions/Progress Updates:    Initial attempt at bed mobility unsuccessful secondary to Lt. LE pain, Performed Lt. LE ROM for warm up (hip/knee flexion; hip abduction PROM). Bed mobility performed then with mod assist, pt needs rails. Stand pivot to bedside commode, wheelchair, recliner all performed with moderate assist throughout session. Moderate verbal cues needed for safety and use of DME.  Wheelchair mobility to gym x 130'. Standing activity for balance and activity tolerance performing Lt. LE active abduction, cues for controlled breathing as pt tends to hyperventilate.   Verbal cues needed for maintaining weight bearing precautions throuhgout.  Therapy Documentation Precautions:  Precautions Precautions: Fall Restrictions Weight Bearing Restrictions: Yes LLE Weight Bearing: Non weight bearing Other Position/Activity Restrictions: Pt with cast on the LLE Pain: 6-7/10 low back pain, RN had just premedicated pt. Pt reports relief with being out of bed.   See FIM for current functional status  Therapy/Group: Individual Therapy  Lahoma Rocker 11/06/2011, 12:05 PM

## 2011-11-06 NOTE — Progress Notes (Signed)
Occupational Therapy Session Note  Patient Details  Name: Katherine [name] MRN: [id number] Date of Birth: Jan 09, 1956  Today's Date: 11/06/2011  Session 1 Time: 0900-1000 Time Calculation (min): 60 min  Short Term Goals: Week 1:  OT Short Term Goal 1 (Week 1): Pt will perform LB bathing with min asisst sit to stand 2/3 sessons. OT Short Term Goal 2 (Week 1): Pt will perform LB dressing with AE PRN and min assist 2/3 sessions. OT Short Term Goal 3 (Week 1): Pt will perform toilet transfer with min assist to bedside toilet. OT Short Term Goal 4 (Week 1): Pt will perform shower transfer with min assist to tub seat/bench using RW with platform.  Skilled Therapeutic Interventions/Progress Updates:    Pt in recliner and transferred to w/c to complete bathing and dressing tasks with sit<>stand.  Pt required max A for sit<>stand and mod A for stand pivot transfer with PFRW.  Pt required max verbal cues for maintaining NWBing on LLE.  Reacher demonstrated for use with donning pants and patient return demonstrated with success.  Pt required steady assist while standing to partially bathe buttocks and to pull up pants.  RN notified of skin breakdown in abdominal area and RN attended to area.  Pt leaned forward with head in sink to assist with washing hair.  Pt transferred back to recliner with mod A.  Focus on activity tolerance, standing balance, transfers, and AE use.  Therapy Documentation Precautions:  Precautions Precautions: Fall Restrictions Weight Bearing Restrictions: Yes LLE Weight Bearing: Non weight bearing Other Position/Activity Restrictions: Pt with cast on the LLE   Pain: Pain Assessment Pain Assessment: 0-10 Pain Score:   4 Pain Type: Acute pain Pain Location: Back Pain Orientation: Left;Lower Pain Descriptors: Aching Pain Onset: On-going Patients Stated Pain Goal: 2 Pain Intervention(s): RN made aware;Repositioned  See FIM for current functional status  Therapy/Group:  Individual Therapy  Session 2 Time: 4818-5909 Pt stated her pain was manageable and did not rate Individual Therapy Pt engaged in recliner>bed transfer and bed mobility.  Leg lifter introduced and patient used for performing sit to supine and for bed mobility.  Pt pleased to have use of AE for moving/turning in bed.    Leotis Shames [facility] 11/06/2011, 10:04 AM
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
## 2011-11-07 ENCOUNTER — Inpatient Hospital Stay (HOSPITAL_COMMUNITY): Payer: No Typology Code available for payment source | Admitting: Speech Pathology

## 2011-11-07 ENCOUNTER — Inpatient Hospital Stay (HOSPITAL_COMMUNITY): Payer: No Typology Code available for payment source

## 2011-11-07 ENCOUNTER — Inpatient Hospital Stay (HOSPITAL_COMMUNITY): Payer: No Typology Code available for payment source | Admitting: Physical Therapy

## 2011-11-07 DIAGNOSIS — S329XXA Fracture of unspecified parts of lumbosacral spine and pelvis, initial encounter for closed fracture: Secondary | ICD-10-CM

## 2011-11-07 DIAGNOSIS — S069X9A Unspecified intracranial injury with loss of consciousness of unspecified duration, initial encounter: Secondary | ICD-10-CM

## 2011-11-07 DIAGNOSIS — Z5189 Encounter for other specified aftercare: Secondary | ICD-10-CM

## 2011-11-07 DIAGNOSIS — S069XAA Unspecified intracranial injury with loss of consciousness status unknown, initial encounter: Secondary | ICD-10-CM

## 2011-11-07 DIAGNOSIS — S8263XA Displaced fracture of lateral malleolus of unspecified fibula, initial encounter for closed fracture: Secondary | ICD-10-CM

## 2011-11-07 DIAGNOSIS — S2239XA Fracture of one rib, unspecified side, initial encounter for closed fracture: Secondary | ICD-10-CM

## 2011-11-07 LAB — CBC
HCT: 33.4 % — ABNORMAL LOW (ref 36.0–46.0)
Hemoglobin: 10.5 g/dL — ABNORMAL LOW (ref 12.0–15.0)
WBC: 8.6 10*3/uL (ref 4.0–10.5)

## 2011-11-07 LAB — GLUCOSE, CAPILLARY
Glucose-Capillary: 85 mg/dL (ref 70–99)
Glucose-Capillary: 148 mg/dL — ABNORMAL HIGH (ref 70–99)
Glucose-Capillary: 133 mg/dL — ABNORMAL HIGH (ref 70–99)
Glucose-Capillary: 110 mg/dL — ABNORMAL HIGH (ref 70–99)

## 2011-11-07 NOTE — Progress Notes (Signed)
Speech Language Pathology Daily Session Note  Patient Details  Name: Katherine [name] MRN: [id number] Date of Birth: 1955-03-12  Today's Date: 11/07/2011 Time: 1100-1140 Time Calculation (min): 40 min  Short Term Goals: Week 1: SLP Short Term Goal 1 (Week 1): Pt will demonstrate divided attention between two tasks with supervision verbal cues for redirection.  SLP Short Term Goal 1 - Progress (Week 1): Progressing toward goal SLP Short Term Goal 2 (Week 1): Pt will express complex wants/needs with Mod I. SLP Short Term Goal 2 - Progress (Week 1): Progressing toward goal SLP Short Term Goal 3 (Week 1): Pt will utilize external memory aids to recall new, complex information with supervision verbal and quesion cues.  SLP Short Term Goal 3 - Progress (Week 1): Progressing toward goal  Skilled Therapeutic Interventions: Pt awake but in bed due to bout of nausea. Pt overall Mod I for emergent awareness of environmental factors that decrease pt's attention, word-finding and thought organization during functional tasks. Pt required supervision verbal cues to verbally problem solve how to reduce the stimuli to increase overall cognitive function. Pt given notebook to utilize to record new, daily information to increase recall.    FIM:  Comprehension Comprehension Mode: Auditory Comprehension: 5-Understands complex 90% of the time/Cues < 10% of the time Expression Expression Mode: Verbal Expression: 5-Expresses complex 90% of the time/cues < 10% of the time Social Interaction Social Interaction: 6-Interacts appropriately with others with medication or extra time (anti-anxiety, antidepressant). Problem Solving Problem Solving: 5-Solves basic 90% of the time/requires cueing < 10% of the time Memory Memory: 4-Recognizes or recalls 75 - 89% of the time/requires cueing 10 - 24% of the time  Pain Pain Assessment Pain Assessment: No/denies pain  Therapy/Group: Individual Therapy  Crimson Dubberly,  Paul Torpey 11/07/2011, 12:26 PM

## 2011-11-07 NOTE — Progress Notes (Signed)
Physical Therapy Session Note  Patient Details  Name: SIMONNE BOULOS MRN: [id number] Date of Birth: January 30, 1956  Today's Date: 11/07/2011 Time: 0730-0828 Time Calculation (min): 58 min  Short Term Goals: Week 1:  PT Short Term Goal 1 (Week 1): Same as LTGs  Skilled Therapeutic Interventions/Progress Updates:    Much improved this morning, better pain control. Bed mobility performed with supervision with use of leg lifter, verbal cues for efficiency. Sit <> stand and stand pivot transfers performed with min assist, verbal cues for bil. UE extension for improved foot clearance. Trial of regular RW without platform, pt tolerated well without increased pain, improved safety noted. Attempted hop gait 2 x 3' with min assist, pt with limited ability to hop secondary to upper body weakness. Bil. UE press ups with hand grips on mat 3 x 5 reps for periscapular and tricep strengthening.   Introduced knee walker, pt fearful for full use today. Discussed stairs vs. Ramp, pt may be interested in renting ramp.   Therapy Documentation Precautions:  Precautions Precautions: Fall Restrictions Weight Bearing Restrictions: Yes LLE Weight Bearing: Non weight bearing Other Position/Activity Restrictions: Pt with cast on the LLE Pain: Pain Assessment Pain Assessment: No/denies pain Pain Score:   2 Pain Type: Acute pain Pain Location: Rib cage Pain Orientation: Left Pain Descriptors: Aching Pain Onset: With Activity Pain Intervention(s): Repositioned  See FIM for current functional status  Therapy/Group: Individual Therapy  Lahoma Rocker 11/07/2011, 12:38 PM
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
## 2011-11-08 ENCOUNTER — Inpatient Hospital Stay (HOSPITAL_COMMUNITY): Payer: No Typology Code available for payment source | Admitting: Occupational Therapy

## 2011-11-08 ENCOUNTER — Inpatient Hospital Stay (HOSPITAL_COMMUNITY): Payer: No Typology Code available for payment source | Admitting: Physical Therapy

## 2011-11-08 ENCOUNTER — Inpatient Hospital Stay (HOSPITAL_COMMUNITY): Payer: No Typology Code available for payment source | Admitting: Speech Pathology

## 2011-11-08 LAB — GLUCOSE, CAPILLARY
Glucose-Capillary: 114 mg/dL — ABNORMAL HIGH (ref 70–99)
Glucose-Capillary: 138 mg/dL — ABNORMAL HIGH (ref 70–99)
Glucose-Capillary: 93 mg/dL (ref 70–99)
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
## 2011-11-08 NOTE — Progress Notes (Signed)
Occupational Therapy Session Note  Patient Details  Name: Katherine [name] MRN: [id number] Date of Birth: [birth year]  Today's Date: 11/08/2011 Time: 6659-9357 Time Calculation (min): 40 min  Short Term Goals: Week 1:  OT Short Term Goal 1 (Week 1): Pt will perform LB bathing with min asisst sit to stand 2/3 sessons. OT Short Term Goal 2 (Week 1): Pt will perform LB dressing with AE PRN and min assist 2/3 sessions. OT Short Term Goal 3 (Week 1): Pt will perform toilet transfer with min assist to bedside toilet. OT Short Term Goal 4 (Week 1): Pt will perform shower transfer with min assist to tub seat/bench using RW with platform.  Skilled Therapeutic Interventions/Progress Updates:  Patient found seated in w/c beside bed with LLE elevated. Patient maneuvered w/c -> sink side for ADL retraining. Focused skilled intervention on UB/LB bathing & dressing, sit/stands, w/c management, w/c safety, overall activity tolerance/endurance, energy conservation techniques prn, grooming tasks seated in w/c at sink, and patients cognitive deficits. At end of session assisted patient -> recliner with LLE elevated and call bell & phone within reach. Encouraged patient to call for assistance for pressure relief while in seated position.   Precautions:  Precautions Precautions: Fall Restrictions Weight Bearing Restrictions: Yes LLE Weight Bearing: Non weight bearing Other Position/Activity Restrictions: Pt with cast on the LLE  See FIM for current functional status  Therapy/Group: Individual Therapy  Elias Dennington 11/08/2011, 9:29 AM

## 2011-11-08 NOTE — Progress Notes (Signed)
Occupational Therapy Session Note  Patient Details  Name: DENNYS TRAUGHBER MRN: [id number] Date of Birth: 1955-04-23  Today's Date: 11/08/2011 Time: 1331-1400 Time Calculation (min): 29 min  Skilled Therapeutic Interventions/Progress Updates:    Performed UE therex using orange theraband for shoulder flexion, horizontal abduction, elbow flexion, and elbow extension.  Performed 1 set each for 10 reps with 12-15 reps with the shoulder exercises and 20 reps for biceps and triceps exercises.  Also used 5 lb dowel rod to perform 2 sets of 15 reps shoulder flexion, 1 set 15 reps shoulder press, and 2 sets 20 reps for bicep curls.  Had pt also hold 5lb weight at 90 degrees shoulder flexion and maintain as long as she could.  Able to last approximately 1 minute.   Therapy Documentation Precautions:  Precautions Precautions: Fall Restrictions Weight Bearing Restrictions: Yes LLE Weight Bearing: Non weight bearing Other Position/Activity Restrictions: Pt with cast on the LLE  Pain: Pain Assessment Pain Assessment: No/denies pain Pain Score:   6 Pain Type: Acute pain Pain Location: Leg Pain Orientation: Left Pain Descriptors: Aching (and left hand) Pain Frequency: Intermittent Pain Onset: With Activity Pain Intervention(s): Medication (See eMAR) ADL: See FIM for current functional status  Therapy/Group: Individual Therapy  Macrae Wiegman OTR/L 11/08/2011, 4:20 PM

## 2011-11-08 NOTE — Progress Notes (Signed)
Speech Language Pathology Daily Session Note  Patient Details  Name: Katherine [name] MRN: [id number] Date of Birth: 1955/03/22  Today's Date: 11/08/2011 Time: 1300-1330 Time Calculation (min): 30 min  Short Term Goals: Week 1: SLP Short Term Goal 1 (Week 1): Pt will demonstrate divided attention between two tasks with supervision verbal cues for redirection.  SLP Short Term Goal 1 - Progress (Week 1): Progressing toward goal SLP Short Term Goal 2 (Week 1): Pt will express complex wants/needs with Mod I. SLP Short Term Goal 2 - Progress (Week 1): Progressing toward goal SLP Short Term Goal 3 (Week 1): Pt will utilize external memory aids to recall new, complex information with supervision verbal and quesion cues.  SLP Short Term Goal 3 - Progress (Week 1): Progressing toward goal  Skilled Therapeutic Interventions: Treatment focus on divided attention in a mildly distracting environment and emergent awareness. Pt participated in a visually simulating/distracting picture matching task. Pt required Mod verbal cues to complete task in a mildly auditory distracting environment and demonstrated decreased frustration tolerance. Pt educated on the importance of being her own advocate and requesting rest breaks when the task is too difficult to decrease frustration. Pt verbalized understanding.    FIM:  Comprehension Comprehension Mode: Auditory Comprehension: 6-Follows complex conversation/direction: With extra time/assistive device Expression Expression Mode: Verbal Expression: 6-Expresses complex ideas: With extra time/assistive device Social Interaction Social Interaction: 5-Interacts appropriately 90% of the time - Needs monitoring or encouragement for participation or interaction. Problem Solving Problem Solving: 5-Solves complex 90% of the time/cues < 10% of the time Memory Memory: 5-Recognizes or recalls 90% of the time/requires cueing < 10% of the time FIM - Eating Eating Activity:  7: Complete independence:no helper  Pain Pain Assessment Pain Assessment: No/denies pain   Therapy/Group: Individual Therapy  Paeton Studer 11/08/2011, 4:13 PM

## 2011-11-08 NOTE — Progress Notes (Signed)
Physical Therapy Note  Patient Details  Name: Katherine [name] MRN: [id number] Date of Birth: February 01, 1956 Today's Date: 11/08/2011  1100-1200  (60 minutes) individual Pain: no complaint of pain Focus of treatment: transfer training; bilateral LE AROM/setrngthening; sit to stand to RW Treatment: transfers (NWB Lt LE) scoot to right with max vcs for setup /technique SBA for transfer; bilateral LE AROM/strengthening (X 20)- rt ankle pumps, bilateral heel slides (AA on left), SLR on left using leg loop AA, ; sit to supine (mat ) SBA using leg loop left; supine to sit SBA using leg loop left; sit to stand from mat SBA with vcs for hand placement; stepping (1 step ) forward /backward using RW with tactile cues to maintain NWB LT LE 3 steps X 5 (limited by fatigue).; wc mobility 120 feet SBA gym to room.   1400-1440 (40 minutes) individual Pain: 6/10 left hand /left lower leg/ nurse notified/meds given Focus of treatment: Transfer training (stand/pivot with RW) NWB LT LE Treatment: Pt instructed in and performed stand/pivot transfers wc >< mat X 4 with min assist to maintain NWB LT LE. Pt requires seated rest break between transfers secondary to fatigue and 3/4 dyspnea. Sit to stand from edge of mat SBA with difficulty.  Samauri Kellenberger,JIM 11/08/2011, 8:41 AM

## 2011-11-09 ENCOUNTER — Inpatient Hospital Stay (HOSPITAL_COMMUNITY): Payer: No Typology Code available for payment source

## 2011-11-09 ENCOUNTER — Inpatient Hospital Stay (HOSPITAL_COMMUNITY): Payer: No Typology Code available for payment source | Admitting: Physical Therapy

## 2011-11-09 LAB — CBC
HCT: 37.5 % (ref 36.0–46.0)
Hemoglobin: 11.8 g/dL — ABNORMAL LOW (ref 12.0–15.0)
MCHC: 31.5 g/dL (ref 30.0–36.0)
MCV: 89.5 fL (ref 78.0–100.0)
WBC: 9.5 10*3/uL (ref 4.0–10.5)

## 2011-11-09 LAB — GLUCOSE, CAPILLARY
Glucose-Capillary: 84 mg/dL (ref 70–99)
Glucose-Capillary: 108 mg/dL — ABNORMAL HIGH (ref 70–99)
Glucose-Capillary: 117 mg/dL — ABNORMAL HIGH (ref 70–99)

## 2011-11-09 LAB — BASIC METABOLIC PANEL
BUN: 24 mg/dL — ABNORMAL HIGH (ref 6–23)
CO2: 26 mEq/L (ref 19–32)
Chloride: 100 mEq/L (ref 96–112)
GFR calc Af Amer: 78 mL/min — ABNORMAL LOW (ref >90)
Potassium: 4.1 mEq/L (ref 3.5–5.1)
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
## 2011-11-09 NOTE — Progress Notes (Signed)
Physical Therapy Note  Patient Details  Name: Katherine [name] MRN: [id number] Date of Birth: [birth year] Today's Date: 11/09/2011  1300-1330 (30 minutes) individual Pain: Pt reports headache 5/10/premedicated. MD requests limiting therapy secondary to hypotensive episodes and pending X-ray LT hand. BP in supine 109/48 pulse 61 Precautions: NWB LT LE Focus of treatment: Bilateral LE strengthening in supine Treatment: Bilateral LE exercises X 20 - RT ankle pumps, heel slides, SAQs, quad sets, SLRs (AA on left)   Kayne Yuhas,JIM 11/09/2011, 8:29 AM

## 2011-11-09 NOTE — Progress Notes (Signed)
Prior to giving blood pressure medications this morning, tried to obtain blood pressure with machine showing low diastolic number. Corazon RN checked manually for 90/40. Dr Naaman Plummer notified. New orders received. BP meds held and encouraging fluids. Patient denied and lightheadedness or dizziness. Patient vitals stable this evening. Patient had a bowel movement today. Patient pain controlled with scheduled pain medications. No other complaints/ issues noted. Continue with plan of care.

## 2011-11-10 ENCOUNTER — Inpatient Hospital Stay (HOSPITAL_COMMUNITY): Payer: No Typology Code available for payment source | Admitting: Physical Therapy

## 2011-11-10 ENCOUNTER — Inpatient Hospital Stay (HOSPITAL_COMMUNITY): Payer: No Typology Code available for payment source

## 2011-11-10 ENCOUNTER — Inpatient Hospital Stay (HOSPITAL_COMMUNITY): Payer: No Typology Code available for payment source | Admitting: Speech Pathology

## 2011-11-10 DIAGNOSIS — S8263XA Displaced fracture of lateral malleolus of unspecified fibula, initial encounter for closed fracture: Secondary | ICD-10-CM

## 2011-11-10 DIAGNOSIS — S069XAA Unspecified intracranial injury with loss of consciousness status unknown, initial encounter: Secondary | ICD-10-CM

## 2011-11-10 DIAGNOSIS — S2239XA Fracture of one rib, unspecified side, initial encounter for closed fracture: Secondary | ICD-10-CM

## 2011-11-10 DIAGNOSIS — S066X9A Traumatic subarachnoid hemorrhage with loss of consciousness of unspecified duration, initial encounter: Secondary | ICD-10-CM

## 2011-11-10 DIAGNOSIS — S069X9A Unspecified intracranial injury with loss of consciousness of unspecified duration, initial encounter: Secondary | ICD-10-CM

## 2011-11-10 DIAGNOSIS — S329XXA Fracture of unspecified parts of lumbosacral spine and pelvis, initial encounter for closed fracture: Secondary | ICD-10-CM

## 2011-11-10 DIAGNOSIS — Z5189 Encounter for other specified aftercare: Secondary | ICD-10-CM

## 2011-11-10 LAB — GLUCOSE, CAPILLARY
Glucose-Capillary: 108 mg/dL — ABNORMAL HIGH (ref 70–99)
Glucose-Capillary: 130 mg/dL — ABNORMAL HIGH (ref 70–99)

## 2011-11-10 LAB — CBC
Platelets: 314 10*3/uL (ref 150–400)
RBC: 4.08 MIL/uL (ref 3.87–5.11)
WBC: 9 10*3/uL (ref 4.0–10.5)

## 2011-11-10 NOTE — Progress Notes (Signed)
Occupational Therapy Session Note  Patient Details  Name: Katherine [name] MRN: [id number] Date of Birth: [birth year]  Today's Date: 11/10/2011 Time: 0900-0956 Time Calculation (min): 56 min  Short Term Goals: Week 1:  OT Short Term Goal 1 (Week 1): Pt will perform LB bathing with min asisst sit to stand 2/3 sessons. OT Short Term Goal 2 (Week 1): Pt will perform LB dressing with AE PRN and min assist 2/3 sessions. OT Short Term Goal 3 (Week 1): Pt will perform toilet transfer with min assist to bedside toilet. OT Short Term Goal 4 (Week 1): Pt will perform shower transfer with min assist to tub seat/bench using RW with platform.  Skilled Therapeutic Interventions/Progress Updates:    Pt engaged in bathing at tub/shower level and dressing with sit<>stand from edge of tub transfer bench.  Pt required steady assist for stand pivot transfer to tub transfer bench.  Pt completed bathing tasks using long handle sponge for RLE and lateral leans to bathe buttocks.  Pt completed dressing with steady assist when standing to pull up pants.  Pt used reacher to assist with threading pants but able to don socks and shoes without assistance.  Focus on transfers, activity tolerance, safety awareness, and standing balance.  Therapy Documentation Precautions:  Precautions Precautions: Fall Restrictions Weight Bearing Restrictions: Yes LLE Weight Bearing: Non weight bearing Other Position/Activity Restrictions: Pt with cast on the LLE  Pain: Pain Assessment Pain Assessment: 0-10 Pain Score:   4 Pain Type: Acute pain Pain Location: Leg Pain Orientation: Left;Lower Pain Descriptors: Aching Pain Frequency: Intermittent Pain Onset: Gradual Patients Stated Pain Goal: 2 Pain Intervention(s): Repositioned Multiple Pain Sites: No  See FIM for current functional status  Therapy/Group: Individual Therapy  Leroy Libman 11/10/2011, 10:02 AM
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
## 2011-11-10 NOTE — Progress Notes (Signed)
Speech Language Pathology Daily Session Note  Patient Details  Name: Katherine [name] MRN: [id number] Date of Birth: Jul 20, 1955  Today's Date: 11/10/2011 Time: 1130-1200 Time Calculation (min): 30 min  Short Term Goals: Week 1: SLP Short Term Goal 1 (Week 1): Pt will demonstrate divided attention between two tasks with supervision verbal cues for redirection.  SLP Short Term Goal 1 - Progress (Week 1): Progressing toward goal SLP Short Term Goal 2 (Week 1): Pt will express complex wants/needs with Mod I. SLP Short Term Goal 2 - Progress (Week 1): Progressing toward goal SLP Short Term Goal 3 (Week 1): Pt will utilize external memory aids to recall new, complex information with supervision verbal and quesion cues.  SLP Short Term Goal 3 - Progress (Week 1): Progressing toward goal  Skilled Therapeutic Interventions: Treatment focus on word-finding. Pt participated in word-finding task and demonstrated utilization of strategies with Mod I, however, continued to verbalize difficulty with task.  Pt demonstrated decreased frustration tolerance with task today.    FIM:  Comprehension Comprehension Mode: Auditory Comprehension: 6-Follows complex conversation/direction: With extra time/assistive device Expression Expression Mode: Verbal Expression: 6-Expresses complex ideas: With extra time/assistive device Social Interaction Social Interaction: 6-Interacts appropriately with others with medication or extra time (anti-anxiety, antidepressant). Problem Solving Problem Solving: 5-Solves complex 90% of the time/cues < 10% of the time Memory Memory: 5-Recognizes or recalls 90% of the time/requires cueing < 10% of the time  Pain Pain Assessment Pain Assessment: No/denies pain Pain Score:   4 Pain Type: Acute pain Pain Location: Leg Pain Orientation: Left;Lower Pain Descriptors: Aching Pain Onset: Gradual Patients Stated Pain Goal: 2 Pain Intervention(s): Repositioned  Therapy/Group:  Individual Therapy  Aviance Cooperwood 11/10/2011, 12:13 PM

## 2011-11-10 NOTE — Progress Notes (Signed)
Physical Therapy Session Note  Patient Details  Name: Katherine [name] MRN: [id number] Date of Birth: 10-01-1955  Today's Date: 11/10/2011 Time: 0730-0829 Time Calculation (min): 59 min  Short Term Goals: Week 1:  PT Short Term Goal 1 (Week 1): Same as LTGs  Skilled Therapeutic Interventions/Progress Updates:   Repeated transfer training on carpet and tile surfaces with Rt. Shoe donned, performed with close supervision. Bed mobility x 4 on different surfaces performed with supervision and use of leg lifter. Pt able to verbalize safe wheelchair positioning and safety with parts management.   Pt with c/o dizziness last week, continues to have some this week. Screened for positional BPPV, all tests negative at this point, somewhat limited by rib pain however. Suspect slight post-concussive vestibular deficits given nature and presentation of dizziness. Will initiate vestibular exercises next session.   Therapy Documentation Precautions:  Precautions Precautions: Fall Restrictions Weight Bearing Restrictions: Yes LLE Weight Bearing: Non weight bearing Other Position/Activity Restrictions: Pt with cast on the LLE Pain: Pain Assessment Pain Assessment: Pain Score:   2 Pain Type: Acute pain Pain Location: Rib cage Pain Orientation: Left;Lower Pain Descriptors: Aching Pain Onset: Gradual Pt had just received medication   See FIM for current functional status  Therapy/Group: Individual Therapy  Lahoma Rocker 11/10/2011, 12:29 PM
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
## 2011-11-10 NOTE — Progress Notes (Signed)
Physical Therapy Session Note  Patient Details  Name: Katherine [name] MRN: [id number] Date of Birth: August 12, 1955  Today's Date: 11/10/2011 Time: 0730-0829 Time Calculation (min): 59 min  Short Term Goals: Week 1:  PT Short Term Goal 1 (Week 1): Same as LTGs  Skilled Therapeutic Interventions/Progress Updates:    Pt noting some finger pain with wheelchair mobility, pt pushed by PT during session. Educated pt on x1 vestibular exercises, performed horizontal x1 viewing exercises for 30 sec. Pt reporting dizziness went from 0/10 to 7/10 (educated on only allowing dizziness to increase by 2-3/10). Pt then quickly reporting that she felt "woozy" "lightheaded" "nauseated".  BP = 109/73, then 87/60 ~1 min later. Pt requested to return to bed, stand pivot with RW, supervision with no increase in lightheadedness with standing. Slight improvement lying down however did not resolve. RN made aware. Pt reports that yesterday her dizzy spells seem to be associated with watching TV. Question whether BP related or due to post-concussive gaze stability deficits.   Reviewed ramp building with pt, given handout.   Therapy Documentation Precautions:  Precautions Precautions: Fall Restrictions Weight Bearing Restrictions: Yes LLE Weight Bearing: Non weight bearing Other Position/Activity Restrictions: Pt with cast on the LLE Pain: Pain Assessment Pain Assessment: No/denies pain Pain Score:   3 Pain Type: Acute pain Pain Location: Finger, middle Pain Orientation: Left Pain Descriptors: aching Pt repositioned, activities modified  See FIM for current functional status  Therapy/Group: Individual Therapy  Lahoma Rocker 11/10/2011, 2:42 PM

## 2011-11-11 ENCOUNTER — Inpatient Hospital Stay (HOSPITAL_COMMUNITY): Payer: No Typology Code available for payment source | Admitting: Physical Therapy

## 2011-11-11 ENCOUNTER — Inpatient Hospital Stay (HOSPITAL_COMMUNITY): Payer: No Typology Code available for payment source

## 2011-11-11 ENCOUNTER — Inpatient Hospital Stay (HOSPITAL_COMMUNITY): Payer: No Typology Code available for payment source | Admitting: Speech Pathology

## 2011-11-11 DIAGNOSIS — S8263XA Displaced fracture of lateral malleolus of unspecified fibula, initial encounter for closed fracture: Secondary | ICD-10-CM

## 2011-11-11 DIAGNOSIS — S069XAA Unspecified intracranial injury with loss of consciousness status unknown, initial encounter: Secondary | ICD-10-CM

## 2011-11-11 DIAGNOSIS — S069X9A Unspecified intracranial injury with loss of consciousness of unspecified duration, initial encounter: Secondary | ICD-10-CM

## 2011-11-11 DIAGNOSIS — Z5189 Encounter for other specified aftercare: Secondary | ICD-10-CM

## 2011-11-11 DIAGNOSIS — S329XXA Fracture of unspecified parts of lumbosacral spine and pelvis, initial encounter for closed fracture: Secondary | ICD-10-CM

## 2011-11-11 DIAGNOSIS — S2239XA Fracture of one rib, unspecified side, initial encounter for closed fracture: Secondary | ICD-10-CM

## 2011-11-11 LAB — GLUCOSE, CAPILLARY
Glucose-Capillary: 140 mg/dL — ABNORMAL HIGH (ref 70–99)
Glucose-Capillary: 86 mg/dL (ref 70–99)
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
## 2011-11-11 NOTE — Progress Notes (Signed)
Patient ID: Katherine [name], female   DOB: 05-11-1955, 56 y.o.   MRN: [id number] Her left hand has only mild swelling and pain at the base of the proximal phalanx on her middle finger.  The x-ray shows a minimal cortical irregularity.  Pain is to be expected with heavy gripping.  No intervention or splint is needed and she can use her left hand as she tolerates.  I will need to see her in the office in 2 weeks to remove her left ankle cast.
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
## 2011-11-11 NOTE — Progress Notes (Signed)
Patient ID: Katherine [name], female   DOB: [birth year], 56 y.o.   MRN: [id number] I have reviewed Katherine [name]'s hand x-rays.  It does look like the source of her pain and swelling is due to a small fracture at the base of her middle finger proximal phalanx.  It has been over 2 weeks and it shows very good alignment.  Just observation is warranted and no splint is necessary for this injury.  She can use her hand as comfort allows recognizing that it will be swollen and painful for a few more weeks.  I'll come by and see her later today.
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
## 2011-11-11 NOTE — Progress Notes (Signed)
Speech Language Pathology Daily Session Note  Patient Details  Name: Katherine [name] MRN: [id number] Date of Birth: 05-01-1955  Today's Date: 11/11/2011 Time: 1330-1400 Time Calculation (min): 30 min  Short Term Goals: Week 1: SLP Short Term Goal 1 (Week 1): Pt will demonstrate divided attention between two tasks with supervision verbal cues for redirection.  SLP Short Term Goal 1 - Progress (Week 1): Progressing toward goal SLP Short Term Goal 2 (Week 1): Pt will express complex wants/needs with Mod I. SLP Short Term Goal 2 - Progress (Week 1): Progressing toward goal SLP Short Term Goal 3 (Week 1): Pt will utilize external memory aids to recall new, complex information with supervision verbal and quesion cues.  SLP Short Term Goal 3 - Progress (Week 1): Progressing toward goal  Skilled Therapeutic Interventions: Treatment focus on emergent awareness of decreased attention in a mildly distracting environment, thought organization and word finding. Pt's brother present during session and pt able to verbalize strategies her family should utilize to increase attention, working memory and word-finding with supervision semantic and question cues.    FIM:  Comprehension Comprehension Mode: Auditory Comprehension: 6-Follows complex conversation/direction: With extra time/assistive device Expression Expression Mode: Verbal Expression: 6-Expresses complex ideas: With extra time/assistive device Social Interaction Social Interaction: 6-Interacts appropriately with others with medication or extra time (anti-anxiety, antidepressant). Problem Solving Problem Solving: 5-Solves complex 90% of the time/cues < 10% of the time Memory Memory: 5-Recognizes or recalls 90% of the time/requires cueing < 10% of the time  Pain Pain Assessment Pain Assessment: No/denies pain  Therapy/Group: Individual Therapy  Loran Auguste 11/11/2011, 3:25 PM

## 2011-11-11 NOTE — Progress Notes (Signed)
Physical Therapy Session Note  Patient Details  Name: ALIEGHA PAULLIN MRN: [id number] Date of Birth: 1955-11-27  Today's Date: 11/11/2011 Time: 9295-7473 Time Calculation (min): 62 min  Short Term Goals: Week 1:  PT Short Term Goal 1 (Week 1): Same as LTGs  Skilled Therapeutic Interventions/Progress Updates:    Wheelchair mobility over controlled environment x 120' with supervision, home environment x 104' with supervision. Practiced negotiation of kitchen, sit <> stands at counter and safety with RW vs. No RW. Pt reports she can not afford knee walker as it is not covered by insurance per case worker, pt's family has rollator walker however. Practiced transferring wheelchair and bedside commode to/from rollator with close supervision and propelling rollator into ADL bathroom. Pt feels this will be a sufficient method for getting into/out of her narrow bathroom doorway. OT made aware and will practice further. Pt reports son is to build ramp today.   Performed x 1 viewing exercises however pt still too dizzy at 20 sec. Pt instructed to perform for just 15 reps and check dizziness tomorrow. Pt's Lt. Eye appears to be giving most trouble with activity - may have disconjugate movement with Rt. Creating dizziness during exercise. Still no nystagmus noted. Pt may need cuing to perform activity slower.   Therapy Documentation Precautions:  Precautions Precautions: Fall Restrictions Weight Bearing Restrictions: Yes LLE Weight Bearing: Non weight bearing Other Position/Activity Restrictions: Pt with cast on the LLE Pain: Pain Assessment Pain Assessment: No/denies pain Locomotion : Wheelchair Mobility Distance: 120'   See FIM for current functional status  Therapy/Group: Individual Therapy  Lahoma Rocker 11/11/2011, 5:24 PM

## 2011-11-11 NOTE — Progress Notes (Addendum)
Physical Therapy Session Note  Patient Details  Name: CHANAE GEMMA MRN: [id number] Date of Birth: 1955-05-19  Today's Date: 11/11/2011 Time: 0730-0828 Time Calculation (min): 58 min  Short Term Goals: Week 1:  PT Short Term Goal 1 (Week 1): Same as LTGs  Skilled Therapeutic Interventions/Progress Updates:    Stand pivots with RW performed with supervision, min assist for stand pivot to knee walker. Trial of two types of knee walkers, pt did very well with articulating walker and would like to continue to practice to use this to get into her bathroom and around her house. Car transfer (squat pivot) performed with close supervision, moderate verbal cues for sequencing and safety.   x1 viewing exercises practiced again starting at 5 sec increasing to 25 in 5 sec increments. 25 sec duration created nauseated/dizziness slightly less intense than yesterday however still quite debilitating however 20 sec duration was tolerable. Pt to perform exercises for 15-20 sec at a time and not before therapy.   Goals updated due to good progress.  Therapy Documentation Precautions:  Precautions Precautions: Fall Restrictions Weight Bearing Restrictions: Yes LLE Weight Bearing: Non weight bearing Other Position/Activity Restrictions: Pt with cast on the LLE Pain: Pain Assessment Pain Assessment: No/denies pain Locomotion : Ambulation Ambulation/Gait Assistance: 4: Min assist Wheelchair Mobility Distance: 50'   See FIM for current functional status  Therapy/Group: Individual Therapy  Lahoma Rocker 11/11/2011, 12:23 PM

## 2011-11-11 NOTE — Progress Notes (Signed)
Occupational Therapy Weekly Progress Note  Patient Details  Name: Katherine [name] MRN: [id number] Date of Birth: 05-19-1955  Today's Date: 11/11/2011  Patient has met 4 of 4 short term goals.  Pt has made steady progress with bathing, dressing, toilet transfers, tub transfers, and toileting since admission.  Pt is supervision overall with steady assist only for transfers and when standing to pull up pants.  Pt will discharge home with son and family.  Equipment recommended are BSC and tub transfer bench.  Patient continues to demonstrate the following deficits: decreased overall activity tolerance/endurance, decreased independence with BADLs & IADLs, decreased independence with functional mobility. Therefore, patient will continue to benefit from skilled OT intervention to enhance overall performance with BADL, iADL and Reduce care partner burden.  Patient progressing toward long term goals..  Continue plan of care.  OT Short Term Goals Week 1:  OT Short Term Goal 1 (Week 1): Pt will perform LB bathing with min asisst sit to stand 2/3 sessons. OT Short Term Goal 1 - Progress (Week 1): Met OT Short Term Goal 2 (Week 1): Pt will perform LB dressing with AE PRN and min assist 2/3 sessions. OT Short Term Goal 2 - Progress (Week 1): Met OT Short Term Goal 3 (Week 1): Pt will perform toilet transfer with min assist to bedside toilet. OT Short Term Goal 3 - Progress (Week 1): Met OT Short Term Goal 4 (Week 1): Pt will perform shower transfer with min assist to tub seat/bench using RW with platform. OT Short Term Goal 4 - Progress (Week 1): Met  Precautions:  Precautions Precautions: Fall Restrictions Weight Bearing Restrictions: Yes LLE Weight Bearing: Non weight bearing Other Position/Activity Restrictions: Pt with cast on the LLE  See FIM for current functional status  Leroy Libman 11/11/2011, 3:43 PM

## 2011-11-11 NOTE — Progress Notes (Signed)
Occupational Therapy Session Note  Patient Details  Name: Katherine [name] MRN: [id number] Date of Birth: [birth year]  Today's Date: 11/11/2011 Time: 4098-1191 Time Calculation (min): 45 min  Short Term Goals: Week 1:  OT Short Term Goal 1 (Week 1): Pt will perform LB bathing with min asisst sit to stand 2/3 sessons. OT Short Term Goal 2 (Week 1): Pt will perform LB dressing with AE PRN and min assist 2/3 sessions. OT Short Term Goal 3 (Week 1): Pt will perform toilet transfer with min assist to bedside toilet. OT Short Term Goal 4 (Week 1): Pt will perform shower transfer with min assist to tub seat/bench using RW with platform.  Skilled Therapeutic Interventions/Progress Updates:    Pt engaged in bathing tasks at tub/shower level using tub transfer bench.  Pt completed dressing tasks with sit<>stand from tub transfer bench.Pt amb into bathroom with knee walker with steady assist.  Pt completed all tasks at supervision level except for ambulation with knee walker at steady assist. Focus on safety awareness, problem solving transfers with knee walker, activity tolerance, and sit<>stand.  Pt exhibiting increased ability to perform sit<>stand and transfers with supervision.  Therapy Documentation Precautions:  Precautions Precautions: Fall Restrictions Weight Bearing Restrictions: Yes LLE Weight Bearing: Non weight bearing Other Position/Activity Restrictions: Pt with cast on the LLE General:   Vital Signs:   Pain: Pain Assessment Pain Assessment: No/denies pain   See FIM for current functional status  Therapy/Group: Individual Therapy  Leroy Libman 11/11/2011, 11:16 AM

## 2011-11-12 ENCOUNTER — Inpatient Hospital Stay (HOSPITAL_COMMUNITY): Payer: No Typology Code available for payment source | Admitting: *Deleted

## 2011-11-12 ENCOUNTER — Inpatient Hospital Stay (HOSPITAL_COMMUNITY): Payer: No Typology Code available for payment source | Admitting: Speech Pathology

## 2011-11-12 ENCOUNTER — Inpatient Hospital Stay (HOSPITAL_COMMUNITY): Payer: No Typology Code available for payment source | Admitting: Physical Therapy

## 2011-11-12 ENCOUNTER — Inpatient Hospital Stay (HOSPITAL_COMMUNITY): Payer: No Typology Code available for payment source

## 2011-11-12 DIAGNOSIS — S8263XA Displaced fracture of lateral malleolus of unspecified fibula, initial encounter for closed fracture: Secondary | ICD-10-CM

## 2011-11-12 DIAGNOSIS — S2239XA Fracture of one rib, unspecified side, initial encounter for closed fracture: Secondary | ICD-10-CM

## 2011-11-12 DIAGNOSIS — S329XXA Fracture of unspecified parts of lumbosacral spine and pelvis, initial encounter for closed fracture: Secondary | ICD-10-CM

## 2011-11-12 DIAGNOSIS — S069XAA Unspecified intracranial injury with loss of consciousness status unknown, initial encounter: Secondary | ICD-10-CM

## 2011-11-12 DIAGNOSIS — Z5189 Encounter for other specified aftercare: Secondary | ICD-10-CM

## 2011-11-12 DIAGNOSIS — S069X9A Unspecified intracranial injury with loss of consciousness of unspecified duration, initial encounter: Secondary | ICD-10-CM

## 2011-11-12 LAB — GLUCOSE, CAPILLARY
Glucose-Capillary: 120 mg/dL — ABNORMAL HIGH (ref 70–99)
Glucose-Capillary: 92 mg/dL (ref 70–99)
Glucose-Capillary: 105 mg/dL — ABNORMAL HIGH (ref 70–99)

## 2011-11-12 NOTE — Progress Notes (Signed)
Recreational Therapy Session Note  Patient Details  Name: Katherine [name] MRN: [id number] Date of Birth: January 06, 1956 Today's Date: 11/12/2011 Time: 1030-1050 Pain: no c/o Skilled Therapeutic Interventions/Progress Updates: Upon entering, pt c/o nausea and dizziness, RN aware.  Pt states she got sick working out in gym with OT.  Discussion seated in room focused on community reintegration, energy conservation techniques and compensatory strategies for c/o dizziness.    Therapy/Group: Individual Therapy  Krishna Heuer 11/12/2011, 10:46 AM

## 2011-11-12 NOTE — Progress Notes (Signed)
Physical Therapy Note  Patient Details  Name: Katherine [name] MRN: [id number] Date of Birth: [birth year] Today's Date: 11/12/2011  1100-1155 (55 minutes) individual Pain: 3/10 left hip initially  Focus of treatment: Transfer training/ wc mobility endurance; bilateral LE  AROM/strengthening to improve functional mobility Treatment: Pt up in wc; wc mobility- 120 feet SBA X 2 room><gym; transfer - SBA for setup , pt attempted squat/pivot transfer without AD but was unable to maintain NWB LT LE; transfer stand/pivot with RW SBA NWB LT LE; sit to supine using leg loop assist SBA; pt reports increased pain LT hip (9/10) in supine with LE on one pillow , moist heat applied/ nurse notified/meds given; AAROM LT hip in supine using therapy ball 2 X 15; LAQs and knee flexion X 20 in sitting, hip flexion in sitting X 15 (AA on left).   [id number] (40 minutes) individual Pain: no initial c/o pain/premedicated Focus of treatment: Transfer training with RW (stepping) NWB LT LE; AAROM LT hip flexion Treatment: Transfers wc><mat stand/turn with RW (stepping with RT LE/NWB LT LE) SBA; sit to supine min assist LT LE; pt unable to tolerate supine position with bolster under LT knee secondary to increased pain lateral thigh ; sitting -pushup blocks 2 X 10; standing with RW support AA LT hip flexion 2 X 10.; wc mobility mod independent unit 120+ feet.    Taneal Sonntag,JIM 11/12/2011, 8:01 AM

## 2011-11-12 NOTE — Progress Notes (Signed)
Speech Language Pathology Daily Session Note  Patient Details  Name: Katherine [name] MRN: [id number] Date of Birth: 1956-02-11  Today's Date: 11/12/2011 Time: 1330-1400 Time Calculation (min): 30 min  Short Term Goals: Week 1: SLP Short Term Goal 1 (Week 1): Pt will demonstrate divided attention between two tasks with supervision verbal cues for redirection.  SLP Short Term Goal 1 - Progress (Week 1): Progressing toward goal SLP Short Term Goal 2 (Week 1): Pt will express complex wants/needs with Mod I. SLP Short Term Goal 2 - Progress (Week 1): Progressing toward goal SLP Short Term Goal 3 (Week 1): Pt will utilize external memory aids to recall new, complex information with supervision verbal and quesion cues.  SLP Short Term Goal 3 - Progress (Week 1): Progressing toward goal  Skilled Therapeutic Interventions: Treatment focus on working memory with utilization of compensatory strategies. Pt utilized the compensatory strategy of association to form relationships to increase recall of specific information with Mod I, however, pt required supervision semantic cues to remember the association between the two items to assist in recall. Treatment session ended 15 minutes early due to nausea.    FIM:  Comprehension Comprehension Mode: Auditory Comprehension: 6-Follows complex conversation/direction: With extra time/assistive device Expression Expression Mode: Verbal Expression: 6-Expresses complex ideas: With extra time/assistive device Social Interaction Social Interaction: 6-Interacts appropriately with others with medication or extra time (anti-anxiety, antidepressant). Problem Solving Problem Solving: 6-Solves complex problems: With extra time Memory Memory: 5-Recognizes or recalls 90% of the time/requires cueing < 10% of the time FIM - Eating Eating Activity: 7: Complete independence:no helper  Pain Pain Assessment Pain Assessment: No/denies pain  Therapy/Group: Individual  Therapy  Noorah Giammona 11/12/2011, 5:01 PM

## 2011-11-12 NOTE — Progress Notes (Signed)
Patient has had intermittent nausea today and reports "head does not feel right." Vitals stable. Zofran given at 1030 with relief at that time. Dan Angiulli notified of patient's status. New orders received. No other complaints noted. Oxy IR 85m given at 1130 for pain. Patient responded with relief to this. Continue with plan of care. Good CMS to LLE.

## 2011-11-12 NOTE — Discharge Summary (Signed)
  Discharge summary job [phone]

## 2011-11-12 NOTE — Progress Notes (Signed)
Social Work Patient ID: Katherine [name], female   DOB: [birth year], 56 y.o.   MRN: [id number]  Met with patient yesterday and spoke with daughter-in-law to review team conference.  Both feel ready for targeted d/c 11/14/11 at supervision level overall.  Reviewed f/u and DME arrangements I will be making.  Family confirms plan is still for pt to go to son's home where daughter-in-law works from home and can assist as needed.  No other concerns at this time.  Arriel Victor
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
## 2011-11-13 ENCOUNTER — Inpatient Hospital Stay (HOSPITAL_COMMUNITY): Payer: No Typology Code available for payment source | Admitting: Speech Pathology

## 2011-11-13 ENCOUNTER — Inpatient Hospital Stay (HOSPITAL_COMMUNITY): Payer: BC Managed Care – PPO | Admitting: Physical Therapy

## 2011-11-13 ENCOUNTER — Inpatient Hospital Stay (HOSPITAL_COMMUNITY): Payer: No Typology Code available for payment source

## 2011-11-13 LAB — GLUCOSE, CAPILLARY

## 2011-11-13 MED ORDER — POLYETHYLENE GLYCOL 3350 17 G PO PACK: 17.0000 g | PACK | Freq: Every day | ORAL | Status: AC

## 2011-11-13 MED ORDER — ACETAMINOPHEN 325 MG PO TABS: 325.0000 mg | ORAL_TABLET | ORAL | Status: DC | PRN

## 2011-11-13 MED ORDER — METHOCARBAMOL 500 MG PO TABS: 500.0000 mg | ORAL_TABLET | Freq: Four times a day (QID) | ORAL | Status: AC | PRN

## 2011-11-13 MED ORDER — INSULIN GLARGINE 100 UNIT/ML ~~LOC~~ SOLN: 10.0000 [IU] | Freq: Every day | SUBCUTANEOUS | Status: DC

## 2011-11-13 MED ORDER — OXYCODONE HCL 20 MG PO TB12: 20.0000 mg | ORAL_TABLET | Freq: Two times a day (BID) | ORAL | Status: DC

## 2011-11-13 MED ORDER — OXYCODONE HCL 10 MG PO TABS: 10.0000 mg | ORAL_TABLET | ORAL | Status: AC | PRN

## 2011-11-13 NOTE — Progress Notes (Addendum)
Physical Therapy Discharge Summary  Patient Details  Name: Katherine [name] MRN: [id number] Date of Birth: 1955-08-01  Today's Date: 11/13/2011 Time: 2440-1027 Time Calculation (min): 35 min   Skilled Therapeutic Interventions/Progress Updates:   Session primarily focused on family education. Educated son and daughter -in-law on positioning for stand pivot transfers and wheelchair parts management. Performed car transfer with pt's family providing supervision. Family verbalizes no questions at this time. Pt practiced wheelchair mobility in community environment over brick and uneven terrain with supervision x >150'. Cues for weight shifting for improved efficiency and control with declines.   Patient has met 10 of 10 long term goals due to improved activity tolerance, improved balance, increased strength, increased range of motion, decreased pain and ability to compensate for deficits.  Patient to discharge at a wheelchair level Supervision, modified independent for simple transfers.   Patient's care partner is independent to provide the necessary physical assistance at discharge.  Reasons goals not met: NA   Recommendation:  Patient will benefit from ongoing skilled PT services in home health setting to continue to advance safe functional mobility, address ongoing impairments in weakness, decreased functional mobility, vestibular deficits, and to minimize fall risk.  Equipment: wheelchair with cushion, RW  Reasons for discharge: treatment goals met and discharge from hospital  Patient/family agrees with progress made and goals achieved: Yes  PT Discharge Precautions/Restrictions  NWB Lt. LE    Pain Pain Assessment Pain Assessment: No/denies pain Vision/Perception  Vision - History Baseline Vision: Wears glasses all the time Patient Visual Report: No change from baseline  Cognition Overall Cognitive Status: Impaired Arousal/Alertness: Awake/alert Orientation Level: Oriented  X4 Attention: Divided Selective Attention: Appears intact Divided Attention: Appears intact Memory: Impaired Memory Impairment: Decreased short term memory Decreased Short Term Memory: Verbal complex Awareness: Appears intact Problem Solving: Appears intact Reasoning: Appears intact Organizing: Appears intact Safety/Judgment: Appears intact Comments: Pt overall Mod I for cognitive functioning  Sensation Sensation Light Touch: Appears Intact (bil. LEs.) Stereognosis: Appears Intact Hot/Cold: Appears Intact Proprioception: Appears Intact Motor  Motor Motor: Within Functional Limits  Mobility Bed Mobility Supine to Sit: 6: Modified independent (Device/Increase time) Sit to Supine: 6: Modified independent (Device/Increase time) Transfers Sit to Stand: 6: Modified independent (Device/Increase time) Stand to Sit: 6: Modified independent (Device/Increase time) Stand Pivot Transfers: 6: Modified independent (Device/Increase time) Locomotion  Ambulation Ambulation: Yes Ambulation/Gait Assistance: 5: Supervision Ambulation Distance (Feet): 150 Feet Assistive device: Other (Comment) (knee walker) Stairs / Additional Locomotion Stairs: No (unable to safely perform, pt has ramp) Product manager Mobility: Yes Wheelchair Assistance: 5: Supervision;6: Modified independent (Device/Increase time) Environmental health practitioner: Both upper extremities Wheelchair Parts Management: Independent Distance: >200' modified independent on unit, >150' supervision in community environment  Trunk/Postural Assessment  Cervical Assessment Cervical Assessment: Within Functional Limits Thoracic Assessment Thoracic Assessment: Within Functional Limits Lumbar Assessment Lumbar Assessment: Within Functional Limits Postural Control Postural Control: Within Functional Limits  Balance Static Sitting Balance Static Sitting - Balance Support: No upper extremity supported Static Sitting - Level of  Assistance: 7: Independent Extremity Assessment  RUE Assessment RUE Assessment: Within Functional Limits LUE Assessment LUE Assessment: Within Functional Limits RLE Assessment RLE Assessment: Within Functional Limits (Strength WFL, functionally still weak) LLE Assessment LLE Assessment: Exceptions to [facility] (Decreased overall ROM and strength due to habitus and bruisi)  See FIM for current functional status  Lahoma Rocker 11/13/2011, 4:49 PM

## 2011-11-13 NOTE — Progress Notes (Signed)
Social Work  Discharge Note  The overall goal for the admission was met for:   Discharge location: Yes - home with son and daughter-in-law  Length of Stay: Yes - 9 days  Discharge activity level: Yes - supervision to modified independent  Home/community participation: Yes  Services provided included: MD, RD, PT, OT, SLP, RN, CM, TR, Pharmacy, Neuropsych and SW  Financial Services: Private Insurance: [employer].  Follow-up services arranged: Home Health: PT, OT, ST via [facility], DME: 20x18 [place] w/c with ELR, basic cushion, rolling walker, 3n1 commode and tub bench via [facility] and Patient/Family has no preference for HH/DME agencies  Comments (or additional information):  Patient/Family verbalized understanding of follow-up arrangements: Yes  Individual responsible for coordination of the follow-up plan: patient  Confirmed correct DME delivered: Katherine [name] 11/13/2011    Katherine [name]
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
## 2011-11-13 NOTE — Progress Notes (Signed)
Physical Therapy Session Note  Patient Details  Name: [name] MRN: [id number] Date of Birth: 1956/02/17  Today's Date: 11/13/2011 Time: 7588-3254 Time Calculation (min): 45 min  Short Term Goals: Week 1:  PT Short Term Goal 1 (Week 1): Same as LTGs  Skilled Therapeutic Interventions/Progress Updates:    Discussed D/C DME needs, adjusted delivered equipment for proper fit. Wheelchair mobility x 200' controlled environment modified independent. Wheelchair mobility x 60' modified independent in home environment. Stand pivot wheelchair <> bed and bed mobility on actual bed with use of leg lifter modified independent. Ambulation on knee walker x 150' with knee walker performed with supervision (100' controlled, 50' home environment).    Therapy Documentation Precautions:  Precautions Precautions: Fall Restrictions Weight Bearing Restrictions: Yes LLE Weight Bearing: Non weight bearing Other Position/Activity Restrictions: Pt with cast on the LLE Pain: Pain Assessment Pain Assessment: No/denies pain  See FIM for current functional status  Therapy/Group: Individual Therapy  Lahoma Rocker 11/13/2011, 12:23 PM

## 2011-11-13 NOTE — Progress Notes (Signed)
Pt discharged home with family. Discharged instructions provided by Dan Zimbabwe, PA. All questions answered. Pt escorted off unit in w/c with personal belonging by Regino Bellow, NT.

## 2011-11-13 NOTE — Progress Notes (Signed)
Occupational Therapy Session Note  Patient Details  Name: Katherine [name] MRN: [id number] Date of Birth: November 05, 1955  Today's Date: 11/13/2011  Session 1 Time: 0800-0900 Time Calculation (min): 60 min   Skilled Therapeutic Interventions/Progress Updates:    Pt engaged in bathing tasks at tub/shower level.  Pt used rollator to tranfer from w/c into bathroom to transfer to tub/tub bench.  Pt completed all bathing and dressing tasks with supervision.  Focus on problem solving in preparation for discharge later in the day, safety awareness, and activity tolerance.  Recommended that someone be with patient when transferring to toilet and tub bench.  Therapy Documentation Precautions:  Precautions Precautions: Fall Restrictions Weight Bearing Restrictions: Yes LLE Weight Bearing: Non weight bearing Other Position/Activity Restrictions: Pt with cast on the LLE   Pain: Pain Assessment Pain Score: Asleep  See FIM for current functional status  Therapy/Group: Individual Therapy  Session 2 Time: 1330-1400 Pt denies pain but later in session c/o increased swelling in LLE. Individual Therapy Pt's son and daughter-in-law present for family education and practicing tub bench transfers and toilet transfers.  Pt uses Rollator to access bathroom from w/c since w/c will not fit into bathroom.  Pt completed transfers with supervision requiring one verbal cue for safety during transfer.  Recommended to family that someone be present with patient when completing transfers and to steady equipment during transfer.  Son and daughter-in-law verbalized understanding and return demonstrated appropriate supervision during transfers.  Leotis Shames [facility] 11/13/2011, 9:04 AM

## 2011-11-13 NOTE — Progress Notes (Signed)
Speech Language Pathology Session Note & Discharge Summary  Patient Details  Name: Katherine [name] MRN: [id number] Date of Birth: 10-Mar-1955  Today's Date: 11/13/2011 Time: 6834-1962 Time Calculation (min): 25 min  Skilled Therapeutic Intervention: Pt has made functional gains and has met 4 out of 4 LTG's this admission. Currently, pt is overall Mod I for complex problem solving, working memory, anticipatory awareness and divided attention. Pt/family education complete. Recommend f/u home health skilled SLP intervention to maximize cognitive function and overall independence.   Patient has met 4 of 4 long term goals.  Patient to discharge at overall Modified Independent level.   Reasons goals not met: N/A   Clinical Impression/Discharge Summary: Pt has made functional gains and has met 4 out of 4 LTG's this admission. Currently, pt is overall Mod I for complex problem solving, working memory, anticipatory awareness and divided attention. Pt/family education complete. Recommend f/u home health skilled SLP intervention to maximize cognitive function and overall independence.   Care Partner:  Caregiver Able to Provide Assistance: Yes  Type of Caregiver Assistance: Cognitive;Physical  Recommendation:  Home Health SLP  Rationale for SLP Follow Up: Maximize cognitive function and independence;Reduce caregiver burden   Equipment: N/A   Reasons for discharge: Treatment goals met;Discharged from hospital   Patient/Family Agrees with Progress Made and Goals Achieved: Yes   See FIM for current functional status  Karron Goens 11/13/2011, 4:38 PM

## 2011-12-01 ENCOUNTER — Other Ambulatory Visit: Payer: Self-pay | Admitting: Neurosurgery

## 2011-12-01 DIAGNOSIS — I609 Nontraumatic subarachnoid hemorrhage, unspecified: Secondary | ICD-10-CM

## 2011-12-02 ENCOUNTER — Ambulatory Visit
Admission: RE | Admit: 2011-12-02 | Discharge: 2011-12-02 | Disposition: A | Discharge status: Home / Self Care | Payer: BC Managed Care – PPO | Source: Ambulatory Visit | Attending: Neurosurgery | Admitting: Neurosurgery

## 2011-12-02 ENCOUNTER — Telehealth: Payer: Self-pay | Admitting: Physical Medicine & Rehabilitation

## 2011-12-02 DIAGNOSIS — I609 Nontraumatic subarachnoid hemorrhage, unspecified: Secondary | ICD-10-CM

## 2011-12-02 NOTE — Telephone Encounter (Signed)
Extend HH PT.  Weightbearing status has changed.  Did not get correct # for [place], [facility] - [phone]

## 2011-12-03 NOTE — Telephone Encounter (Signed)
Verbal orders given to a RN at [facility].

## 2011-12-05 ENCOUNTER — Telehealth: Payer: Self-pay | Admitting: *Deleted

## 2011-12-05 MED ORDER — OXYCODONE HCL 10 MG PO TABS: 10.0000 mg | ORAL_TABLET | ORAL | Status: DC

## 2011-12-05 MED ORDER — OXYCODONE HCL 20 MG PO TB12: 20.0000 mg | ORAL_TABLET | Freq: Two times a day (BID) | ORAL | Status: DC

## 2011-12-05 NOTE — Telephone Encounter (Signed)
Needs refill on Oxycontin and Oxycodone.

## 2011-12-05 NOTE — Telephone Encounter (Signed)
Per Dr. Naaman Plummer, it is okay to give her one more refill.  Pt daughter-in-law is aware that she can come pick them up.
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
## 2011-12-08 NOTE — Discharge Summary (Signed)
Katherine Vanwagoner, MD, MPH, FACS Pager: [phone]  

## 2011-12-10 ENCOUNTER — Encounter
Payer: BC Managed Care – PPO | Attending: Physical Medicine & Rehabilitation | Admitting: Physical Medicine & Rehabilitation

## 2011-12-10 ENCOUNTER — Encounter: Payer: Self-pay | Admitting: Physical Medicine & Rehabilitation

## 2011-12-10 VITALS — BP 142/72 | HR 71 | Resp 16 | Ht 66.0 in | Wt 256.0 lb

## 2011-12-10 DIAGNOSIS — S82409A Unspecified fracture of shaft of unspecified fibula, initial encounter for closed fracture: Secondary | ICD-10-CM

## 2011-12-10 DIAGNOSIS — S82402A Unspecified fracture of shaft of left fibula, initial encounter for closed fracture: Secondary | ICD-10-CM

## 2011-12-10 DIAGNOSIS — S8410XA Injury of peroneal nerve at lower leg level, unspecified leg, initial encounter: Secondary | ICD-10-CM

## 2011-12-10 DIAGNOSIS — S82899A Other fracture of unspecified lower leg, initial encounter for closed fracture: Secondary | ICD-10-CM | POA: Insufficient documentation

## 2011-12-10 DIAGNOSIS — S066XAA Traumatic subarachnoid hemorrhage with loss of consciousness status unknown, initial encounter: Secondary | ICD-10-CM

## 2011-12-10 DIAGNOSIS — S2249XA Multiple fractures of ribs, unspecified side, initial encounter for closed fracture: Secondary | ICD-10-CM | POA: Insufficient documentation

## 2011-12-10 DIAGNOSIS — Z5181 Encounter for therapeutic drug level monitoring: Secondary | ICD-10-CM

## 2011-12-10 DIAGNOSIS — S32591A Other specified fracture of right pubis, initial encounter for closed fracture: Secondary | ICD-10-CM

## 2011-12-10 DIAGNOSIS — S32509A Unspecified fracture of unspecified pubis, initial encounter for closed fracture: Secondary | ICD-10-CM | POA: Insufficient documentation

## 2011-12-10 DIAGNOSIS — X58XXXA Exposure to other specified factors, initial encounter: Secondary | ICD-10-CM | POA: Insufficient documentation

## 2011-12-10 DIAGNOSIS — S7010XA Contusion of unspecified thigh, initial encounter: Secondary | ICD-10-CM

## 2011-12-10 DIAGNOSIS — S066X9A Traumatic subarachnoid hemorrhage with loss of consciousness of unspecified duration, initial encounter: Secondary | ICD-10-CM

## 2011-12-10 NOTE — Patient Instructions (Signed)
WORK ON MASSAGING YOUR LEFT THIGH. KEEP LEG ELEVATED. UTILIZE MOIST HEAT AND STRETCHING ALSO
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
## 2011-12-12 ENCOUNTER — Telehealth: Payer: Self-pay | Admitting: Physical Medicine & Rehabilitation

## 2011-12-12 NOTE — Telephone Encounter (Signed)
Lm for RN to call office.

## 2011-12-12 NOTE — Telephone Encounter (Signed)
Patient has swollen area on L inner thigh.  Baseball size, soft, pallable, no pain.  No change in foot.  Also, needs verbal to continue visits 1wk/3

## 2011-12-15 NOTE — Telephone Encounter (Signed)
I have given the verbal order to Morey Hummingbird to extend the visits. She just wanted to let us know about the swollen area on her thigh. Nothing needs to be done about this.

## 2011-12-16 ENCOUNTER — Telehealth: Payer: Self-pay | Admitting: *Deleted

## 2011-12-16 NOTE — Telephone Encounter (Signed)
See progress note for condition/plan. Schedule for follow up visit date. She is unable to participate in work of any kind at this point. i would project return to work at 72month or more. We will further specify RTW at follow up visits

## 2011-12-16 NOTE — Telephone Encounter (Signed)
Need update on condition, restrictions&limitations, and return to work projection, tx plan, and follow up dates. Katherine [name] has not been paid and they are trying to get that pushed through.

## 2011-12-16 NOTE — Telephone Encounter (Signed)
Left VM for Juliann Pulse to call and leave [name] the fax number and I will fax the information.

## 2011-12-17 NOTE — Telephone Encounter (Signed)
Information faxed to [employer]

## 2011-12-27 ENCOUNTER — Emergency Department (HOSPITAL_COMMUNITY): Payer: BC Managed Care – PPO

## 2011-12-27 ENCOUNTER — Emergency Department (HOSPITAL_COMMUNITY)
Admission: EM | Admit: 2011-12-27 | Discharge: 2011-12-27 | Disposition: A | Discharge status: Home / Self Care | Payer: BC Managed Care – PPO | Attending: Emergency Medicine | Admitting: Emergency Medicine

## 2011-12-27 DIAGNOSIS — F3289 Other specified depressive episodes: Secondary | ICD-10-CM | POA: Insufficient documentation

## 2011-12-27 DIAGNOSIS — Z8619 Personal history of other infectious and parasitic diseases: Secondary | ICD-10-CM | POA: Insufficient documentation

## 2011-12-27 DIAGNOSIS — G43909 Migraine, unspecified, not intractable, without status migrainosus: Secondary | ICD-10-CM | POA: Insufficient documentation

## 2011-12-27 DIAGNOSIS — R042 Hemoptysis: Secondary | ICD-10-CM | POA: Insufficient documentation

## 2011-12-27 DIAGNOSIS — J189 Pneumonia, unspecified organism: Secondary | ICD-10-CM

## 2011-12-27 DIAGNOSIS — E782 Mixed hyperlipidemia: Secondary | ICD-10-CM | POA: Insufficient documentation

## 2011-12-27 DIAGNOSIS — F17200 Nicotine dependence, unspecified, uncomplicated: Secondary | ICD-10-CM | POA: Insufficient documentation

## 2011-12-27 DIAGNOSIS — F411 Generalized anxiety disorder: Secondary | ICD-10-CM | POA: Insufficient documentation

## 2011-12-27 DIAGNOSIS — F329 Major depressive disorder, single episode, unspecified: Secondary | ICD-10-CM | POA: Insufficient documentation

## 2011-12-27 DIAGNOSIS — I10 Essential (primary) hypertension: Secondary | ICD-10-CM | POA: Insufficient documentation

## 2011-12-27 DIAGNOSIS — L259 Unspecified contact dermatitis, unspecified cause: Secondary | ICD-10-CM | POA: Insufficient documentation

## 2011-12-27 DIAGNOSIS — Z87448 Personal history of other diseases of urinary system: Secondary | ICD-10-CM | POA: Insufficient documentation

## 2011-12-27 DIAGNOSIS — E119 Type 2 diabetes mellitus without complications: Secondary | ICD-10-CM | POA: Insufficient documentation

## 2011-12-27 DIAGNOSIS — Z79899 Other long term (current) drug therapy: Secondary | ICD-10-CM | POA: Insufficient documentation

## 2011-12-27 LAB — POCT I-STAT, CHEM 8
BUN: 10 mg/dL (ref 6–23)
Creatinine, Ser: 0.9 mg/dL (ref 0.50–1.10)
Potassium: 3.5 mEq/L (ref 3.5–5.1)
Sodium: 145 mEq/L (ref 135–145)
TCO2: 28 mmol/L (ref 0–100)

## 2011-12-27 MED ORDER — AMOXICILLIN-POT CLAVULANATE 875-125 MG PO TABS: 1.0000 | ORAL_TABLET | Freq: Two times a day (BID) | ORAL | Status: DC

## 2011-12-27 MED ORDER — IOHEXOL 300 MG/ML  SOLN
80.0000 mL | Freq: Once | INTRAMUSCULAR | Status: AC | PRN
  Administered 2011-12-27: 80 mL via INTRAVENOUS

## 2011-12-27 MED ORDER — AMOXICILLIN-POT CLAVULANATE 875-125 MG PO TABS
1.0000 | ORAL_TABLET | Freq: Two times a day (BID) | ORAL | Status: DC
  Administered 2011-12-27: 1 via ORAL
  Filled 2011-12-27: qty 1

## 2011-12-27 NOTE — ED Notes (Signed)
New #18g in rt ac by CT tech for CT Angio Chest

## 2011-12-27 NOTE — ED Notes (Signed)
Pt awoke at 5 this am and states she started coughing up bright red blood.

## 2011-12-27 NOTE — ED Provider Notes (Signed)
1:06 PM Patient with a hx sig for hemoptysis was placed in CDU on observation protocol pending CTA chest by Dr. Vanessa Kick. Patient care resumed from Dr. Vanessa Kick .  Patient is here for hemoptysis and chest pain and has received pain medication. Patient re-evaluated and is resting comfortable despite lingering chest pain, VSS, with no new complaints or concerns at this time. Plan per previous provider is to wait for CTA chest results and disposition based on results. On exam: hemodynamically stable, NAD, heart w/ RRR, lungs CTAB, Chest & abd non-tender, no peripheral edema or calf tenderness.   Patient's CTA shows right upper lobe pneumonia and paratracheal reactive lymphnodes. Patient will be treated as CAP, as discussed with Dr. Vanessa Kick, and recommended follow up with her PCP, Dr. Brigitte Pulse. Patient is informed of results and is agreeable to plan. Patient will have a dose of Augmentin before leaving the hospital. No further evaluation needed here.    Alvina Chou, PA-C 12/27/11 1310
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
## 2011-12-27 NOTE — ED Provider Notes (Signed)
Medical screening examination/treatment/procedure(s) were conducted as a shared visit with non-physician practitioner(s) and myself.  I personally evaluated the patient during the encounter  Barbara Cower, MD 12/27/11 [phone]

## 2012-01-07 ENCOUNTER — Emergency Department (HOSPITAL_COMMUNITY)
Admission: EM | Admit: 2012-01-07 | Discharge: 2012-01-08 | Disposition: A | Discharge status: Home / Self Care | Payer: BC Managed Care – PPO | Attending: Emergency Medicine | Admitting: Emergency Medicine

## 2012-01-07 ENCOUNTER — Encounter (HOSPITAL_COMMUNITY): Payer: Self-pay | Admitting: Adult Health

## 2012-01-07 ENCOUNTER — Emergency Department (HOSPITAL_COMMUNITY): Payer: BC Managed Care – PPO

## 2012-01-07 DIAGNOSIS — N318 Other neuromuscular dysfunction of bladder: Secondary | ICD-10-CM | POA: Insufficient documentation

## 2012-01-07 DIAGNOSIS — F17200 Nicotine dependence, unspecified, uncomplicated: Secondary | ICD-10-CM | POA: Insufficient documentation

## 2012-01-07 DIAGNOSIS — R42 Dizziness and giddiness: Secondary | ICD-10-CM

## 2012-01-07 DIAGNOSIS — E119 Type 2 diabetes mellitus without complications: Secondary | ICD-10-CM | POA: Insufficient documentation

## 2012-01-07 DIAGNOSIS — Z872 Personal history of diseases of the skin and subcutaneous tissue: Secondary | ICD-10-CM | POA: Insufficient documentation

## 2012-01-07 DIAGNOSIS — E785 Hyperlipidemia, unspecified: Secondary | ICD-10-CM | POA: Insufficient documentation

## 2012-01-07 DIAGNOSIS — F329 Major depressive disorder, single episode, unspecified: Secondary | ICD-10-CM | POA: Insufficient documentation

## 2012-01-07 DIAGNOSIS — I10 Essential (primary) hypertension: Secondary | ICD-10-CM | POA: Insufficient documentation

## 2012-01-07 DIAGNOSIS — R112 Nausea with vomiting, unspecified: Secondary | ICD-10-CM | POA: Insufficient documentation

## 2012-01-07 DIAGNOSIS — Z794 Long term (current) use of insulin: Secondary | ICD-10-CM | POA: Insufficient documentation

## 2012-01-07 DIAGNOSIS — F3289 Other specified depressive episodes: Secondary | ICD-10-CM | POA: Insufficient documentation

## 2012-01-07 DIAGNOSIS — G4730 Sleep apnea, unspecified: Secondary | ICD-10-CM | POA: Insufficient documentation

## 2012-01-07 DIAGNOSIS — Z87828 Personal history of other (healed) physical injury and trauma: Secondary | ICD-10-CM | POA: Insufficient documentation

## 2012-01-07 DIAGNOSIS — F411 Generalized anxiety disorder: Secondary | ICD-10-CM | POA: Insufficient documentation

## 2012-01-07 DIAGNOSIS — R51 Headache: Secondary | ICD-10-CM | POA: Insufficient documentation

## 2012-01-07 DIAGNOSIS — Z8619 Personal history of other infectious and parasitic diseases: Secondary | ICD-10-CM | POA: Insufficient documentation

## 2012-01-07 DIAGNOSIS — Z79899 Other long term (current) drug therapy: Secondary | ICD-10-CM | POA: Insufficient documentation

## 2012-01-07 LAB — BASIC METABOLIC PANEL
BUN: 13 mg/dL (ref 6–23)
Calcium: 9.6 mg/dL (ref 8.4–10.5)
GFR calc non Af Amer: >90 mL/min (ref >90)
Glucose, Bld: 139 mg/dL — ABNORMAL HIGH (ref 70–99)

## 2012-01-07 LAB — CBC WITH DIFFERENTIAL/PLATELET
Eosinophils Absolute: 0.2 10*3/uL (ref 0.0–0.7)
Eosinophils Relative: 2 % (ref 0–5)
Hemoglobin: 15 g/dL (ref 12.0–15.0)
Lymphs Abs: 2.3 10*3/uL (ref 0.7–4.0)
MCH: 27.2 pg (ref 26.0–34.0)
MCV: 83.7 fL (ref 78.0–100.0)
Monocytes Relative: 11 % (ref 3–12)
RBC: 5.52 MIL/uL — ABNORMAL HIGH (ref 3.87–5.11)

## 2012-01-07 MED ORDER — SODIUM CHLORIDE 0.9 % IV BOLUS (SEPSIS)
1000.0000 mL | Freq: Once | INTRAVENOUS | Status: AC
  Administered 2012-01-08: 1000 mL via INTRAVENOUS

## 2012-01-07 MED ORDER — LORAZEPAM 2 MG/ML IJ SOLN
1.0000 mg | Freq: Once | INTRAMUSCULAR | Status: AC
  Administered 2012-01-08: 1 mg via INTRAVENOUS
  Filled 2012-01-07: qty 1

## 2012-01-07 MED ORDER — MECLIZINE HCL 25 MG PO TABS
25.0000 mg | ORAL_TABLET | Freq: Once | ORAL | Status: AC
  Administered 2012-01-07: 25 mg via ORAL
  Filled 2012-01-07: qty 1
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
## 2012-01-07 NOTE — ED Notes (Signed)
Pt into room via w/c, alert, NAD, calm, interactive, resps e/u, sitting upright, family at side.

## 2012-01-07 NOTE — ED Notes (Signed)
Pt reports dizziness and nausea that started yesterday and has progressively gotten worse; pt states feeling unsteady on her feet during dizzy spells; pt also reports having a headache to her frontal lobe that she describes as a "dull ache"; pt denies light sensitivity but is having some blurred vision;

## 2012-01-07 NOTE — ED Notes (Signed)
EDP at BS 

## 2012-01-07 NOTE — ED Notes (Signed)
Lab finished at [facility], pharm tech into room.
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
## 2012-01-08 ENCOUNTER — Other Ambulatory Visit: Payer: Self-pay | Admitting: Family Medicine

## 2012-01-08 ENCOUNTER — Encounter (HOSPITAL_COMMUNITY): Payer: Self-pay | Admitting: Radiology

## 2012-01-08 ENCOUNTER — Emergency Department (HOSPITAL_COMMUNITY): Payer: BC Managed Care – PPO

## 2012-01-08 DIAGNOSIS — I889 Nonspecific lymphadenitis, unspecified: Secondary | ICD-10-CM

## 2012-01-08 MED ORDER — FENTANYL CITRATE 0.05 MG/ML IJ SOLN
25.0000 ug | Freq: Once | INTRAMUSCULAR | Status: AC
  Administered 2012-01-08: 25 ug via INTRAVENOUS
  Filled 2012-01-08: qty 2

## 2012-01-08 MED ORDER — MECLIZINE HCL 25 MG PO TABS: 25.0000 mg | ORAL_TABLET | Freq: Four times a day (QID) | ORAL | Status: DC

## 2012-01-08 NOTE — ED Notes (Signed)
Pt back from CT

## 2012-01-08 NOTE — ED Notes (Signed)
Pt ambulated without assistance from staff. Pt stated that she feels better. Denies n/v, dizziness

## 2012-01-08 NOTE — ED Notes (Signed)
Patient transported to CT 

## 2012-01-13 ENCOUNTER — Ambulatory Visit: Payer: BC Managed Care – PPO | Attending: Physical Medicine & Rehabilitation | Admitting: *Deleted

## 2012-01-13 ENCOUNTER — Ambulatory Visit: Payer: BC Managed Care – PPO | Admitting: Speech Pathology

## 2012-01-13 DIAGNOSIS — M6281 Muscle weakness (generalized): Secondary | ICD-10-CM | POA: Insufficient documentation

## 2012-01-13 DIAGNOSIS — I69919 Unspecified symptoms and signs involving cognitive functions following unspecified cerebrovascular disease: Secondary | ICD-10-CM | POA: Insufficient documentation

## 2012-01-13 DIAGNOSIS — R269 Unspecified abnormalities of gait and mobility: Secondary | ICD-10-CM | POA: Insufficient documentation

## 2012-01-13 DIAGNOSIS — I69998 Other sequelae following unspecified cerebrovascular disease: Secondary | ICD-10-CM | POA: Insufficient documentation

## 2012-01-13 DIAGNOSIS — Z5189 Encounter for other specified aftercare: Secondary | ICD-10-CM | POA: Insufficient documentation

## 2012-01-19 ENCOUNTER — Encounter
Payer: BC Managed Care – PPO | Attending: Physical Medicine & Rehabilitation | Admitting: Physical Medicine & Rehabilitation

## 2012-01-19 ENCOUNTER — Telehealth: Payer: Self-pay | Admitting: *Deleted

## 2012-01-19 ENCOUNTER — Encounter: Payer: Self-pay | Admitting: Physical Medicine & Rehabilitation

## 2012-01-19 VITALS — BP 156/88 | HR 65 | Resp 14 | Ht 66.0 in | Wt 253.0 lb

## 2012-01-19 DIAGNOSIS — X58XXXA Exposure to other specified factors, initial encounter: Secondary | ICD-10-CM | POA: Insufficient documentation

## 2012-01-19 DIAGNOSIS — S066X9A Traumatic subarachnoid hemorrhage with loss of consciousness of unspecified duration, initial encounter: Secondary | ICD-10-CM

## 2012-01-19 DIAGNOSIS — S82899A Other fracture of unspecified lower leg, initial encounter for closed fracture: Secondary | ICD-10-CM | POA: Insufficient documentation

## 2012-01-19 DIAGNOSIS — S2249XA Multiple fractures of ribs, unspecified side, initial encounter for closed fracture: Secondary | ICD-10-CM | POA: Insufficient documentation

## 2012-01-19 DIAGNOSIS — S066XAA Traumatic subarachnoid hemorrhage with loss of consciousness status unknown, initial encounter: Secondary | ICD-10-CM

## 2012-01-19 DIAGNOSIS — T1490XA Injury, unspecified, initial encounter: Secondary | ICD-10-CM

## 2012-01-19 DIAGNOSIS — S7010XA Contusion of unspecified thigh, initial encounter: Secondary | ICD-10-CM | POA: Insufficient documentation

## 2012-01-19 DIAGNOSIS — S8410XA Injury of peroneal nerve at lower leg level, unspecified leg, initial encounter: Secondary | ICD-10-CM | POA: Insufficient documentation

## 2012-01-19 DIAGNOSIS — S32509A Unspecified fracture of unspecified pubis, initial encounter for closed fracture: Secondary | ICD-10-CM | POA: Insufficient documentation

## 2012-01-19 MED ORDER — OXYCODONE HCL 20 MG PO TB12: 20.0000 mg | ORAL_TABLET | Freq: Two times a day (BID) | ORAL | Status: DC

## 2012-01-19 MED ORDER — OXYCODONE HCL 10 MG PO TABS: 10.0000 mg | ORAL_TABLET | Freq: Four times a day (QID) | ORAL | Status: DC | PRN

## 2012-01-19 NOTE — Patient Instructions (Signed)
ASK YOUR THERAPIST TO WORK ON E-STIM FOR YOUR LEFT LEG, PERONEAL NERVE INJURY TO HELP MAINTAIN MUSCLE TONE IN THE LEG.
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
## 2012-01-19 NOTE — Telephone Encounter (Signed)
Faxed today's office notes to [facility] [employer] Attention Elmyra Ricks [phone] Case# [id number]

## 2012-01-19 NOTE — Telephone Encounter (Signed)
Katherine [name] with [employer] is requesting updates from today's OV. Would like OV note faxed to her if possible. F# [phone] Case # [id number].

## 2012-01-22 ENCOUNTER — Ambulatory Visit: Payer: BC Managed Care – PPO | Admitting: Speech Pathology

## 2012-01-22 ENCOUNTER — Ambulatory Visit: Payer: BC Managed Care – PPO | Admitting: Physical Therapy

## 2012-01-22 ENCOUNTER — Telehealth: Payer: Self-pay | Admitting: *Deleted

## 2012-01-22 NOTE — Telephone Encounter (Signed)
Received call from Katherine [name], [place] at [facility]. Patient had a fall last night. She is now complaining of pain along her right big toe to the top of her foot and ankle. Area is swollen and bruised. Would like an order for x-ray before they continue with therapy.

## 2012-01-23 ENCOUNTER — Encounter
Payer: BC Managed Care – PPO | Attending: Physical Medicine and Rehabilitation | Admitting: Physical Medicine and Rehabilitation

## 2012-01-23 ENCOUNTER — Ambulatory Visit
Admission: RE | Admit: 2012-01-23 | Discharge: 2012-01-23 | Disposition: A | Discharge status: Home / Self Care | Payer: BC Managed Care – PPO | Source: Ambulatory Visit | Attending: Physical Medicine and Rehabilitation | Admitting: Physical Medicine and Rehabilitation

## 2012-01-23 ENCOUNTER — Encounter: Payer: Self-pay | Admitting: Physical Medicine and Rehabilitation

## 2012-01-23 ENCOUNTER — Telehealth: Payer: Self-pay

## 2012-01-23 VITALS — BP 155/89 | HR 82 | Resp 14 | Ht 66.0 in | Wt 250.8 lb

## 2012-01-23 DIAGNOSIS — X500XXA Overexertion from strenuous movement or load, initial encounter: Secondary | ICD-10-CM | POA: Insufficient documentation

## 2012-01-23 DIAGNOSIS — Z87820 Personal history of traumatic brain injury: Secondary | ICD-10-CM | POA: Insufficient documentation

## 2012-01-23 DIAGNOSIS — S93402A Sprain of unspecified ligament of left ankle, initial encounter: Secondary | ICD-10-CM

## 2012-01-23 DIAGNOSIS — X58XXXA Exposure to other specified factors, initial encounter: Secondary | ICD-10-CM | POA: Insufficient documentation

## 2012-01-23 DIAGNOSIS — M25579 Pain in unspecified ankle and joints of unspecified foot: Secondary | ICD-10-CM | POA: Insufficient documentation

## 2012-01-23 DIAGNOSIS — G4730 Sleep apnea, unspecified: Secondary | ICD-10-CM | POA: Insufficient documentation

## 2012-01-23 DIAGNOSIS — E785 Hyperlipidemia, unspecified: Secondary | ICD-10-CM | POA: Insufficient documentation

## 2012-01-23 DIAGNOSIS — S93409A Sprain of unspecified ligament of unspecified ankle, initial encounter: Secondary | ICD-10-CM | POA: Insufficient documentation

## 2012-01-23 DIAGNOSIS — I10 Essential (primary) hypertension: Secondary | ICD-10-CM | POA: Insufficient documentation

## 2012-01-23 DIAGNOSIS — Z8781 Personal history of (healed) traumatic fracture: Secondary | ICD-10-CM | POA: Insufficient documentation

## 2012-01-23 DIAGNOSIS — M25476 Effusion, unspecified foot: Secondary | ICD-10-CM | POA: Insufficient documentation

## 2012-01-23 DIAGNOSIS — S8410XA Injury of peroneal nerve at lower leg level, unspecified leg, initial encounter: Secondary | ICD-10-CM | POA: Insufficient documentation

## 2012-01-23 DIAGNOSIS — E119 Type 2 diabetes mellitus without complications: Secondary | ICD-10-CM | POA: Insufficient documentation

## 2012-01-23 DIAGNOSIS — F17200 Nicotine dependence, unspecified, uncomplicated: Secondary | ICD-10-CM | POA: Insufficient documentation

## 2012-01-23 DIAGNOSIS — M25473 Effusion, unspecified ankle: Secondary | ICD-10-CM | POA: Insufficient documentation

## 2012-01-23 MED ORDER — DICLOFENAC SODIUM 1 % TD GEL: 2.0000 g | Freq: Four times a day (QID) | TRANSDERMAL | Status: DC

## 2012-01-23 NOTE — Telephone Encounter (Signed)
Left message advising patient of results.
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
## 2012-01-23 NOTE — Telephone Encounter (Signed)
Message copied by Amado Coe on Fri Jan 23, 2012  1:36 PM ------      Message from: Aundria Mems      Created: Fri Jan 23, 2012  1:27 PM       Please call patient , tell her that she does not have a new fracture, she just has sprained her ankle, like I told her was most likely the case. Her old fracture is healing.

## 2012-01-23 NOTE — Patient Instructions (Signed)
Elevate and rest your left foot, apply ice or a cold pack, you can apply Arnica cream to your ankle for pain relief, if the insurance does not pay for the Voltaren gel.

## 2012-01-23 NOTE — Telephone Encounter (Signed)
Called patient to see if she can come in this morning to see Santiago Glad. Patient ok with being evaluated in the office. Transferred up front to schedule appointment.

## 2012-01-27 ENCOUNTER — Ambulatory Visit: Payer: BC Managed Care – PPO | Admitting: *Deleted

## 2012-01-27 ENCOUNTER — Ambulatory Visit: Payer: BC Managed Care – PPO

## 2012-01-27 ENCOUNTER — Ambulatory Visit
Admission: RE | Admit: 2012-01-27 | Discharge: 2012-01-27 | Disposition: A | Discharge status: Home / Self Care | Payer: BC Managed Care – PPO | Source: Ambulatory Visit | Attending: Family Medicine | Admitting: Family Medicine

## 2012-01-27 DIAGNOSIS — I889 Nonspecific lymphadenitis, unspecified: Secondary | ICD-10-CM

## 2012-02-06 ENCOUNTER — Ambulatory Visit: Payer: BC Managed Care – PPO | Attending: Physical Medicine & Rehabilitation | Admitting: Physical Therapy

## 2012-02-06 ENCOUNTER — Ambulatory Visit: Payer: BC Managed Care – PPO

## 2012-02-06 DIAGNOSIS — R269 Unspecified abnormalities of gait and mobility: Secondary | ICD-10-CM | POA: Insufficient documentation

## 2012-02-06 DIAGNOSIS — M6281 Muscle weakness (generalized): Secondary | ICD-10-CM | POA: Insufficient documentation

## 2012-02-06 DIAGNOSIS — I69998 Other sequelae following unspecified cerebrovascular disease: Secondary | ICD-10-CM | POA: Insufficient documentation

## 2012-02-06 DIAGNOSIS — I69919 Unspecified symptoms and signs involving cognitive functions following unspecified cerebrovascular disease: Secondary | ICD-10-CM | POA: Insufficient documentation

## 2012-02-06 DIAGNOSIS — Z5189 Encounter for other specified aftercare: Secondary | ICD-10-CM | POA: Insufficient documentation

## 2012-02-09 ENCOUNTER — Other Ambulatory Visit (HOSPITAL_COMMUNITY): Payer: Self-pay | Admitting: Family Medicine

## 2012-02-09 DIAGNOSIS — R0602 Shortness of breath: Secondary | ICD-10-CM

## 2012-02-12 ENCOUNTER — Ambulatory Visit (HOSPITAL_COMMUNITY)
Admission: RE | Admit: 2012-02-12 | Discharge: 2012-02-12 | Disposition: A | Discharge status: Home / Self Care | Payer: BC Managed Care – PPO | Source: Ambulatory Visit | Attending: Family Medicine | Admitting: Family Medicine

## 2012-02-12 DIAGNOSIS — R0602 Shortness of breath: Secondary | ICD-10-CM | POA: Insufficient documentation

## 2012-02-12 MED ORDER — ALBUTEROL SULFATE (5 MG/ML) 0.5% IN NEBU
2.5000 mg | INHALATION_SOLUTION | Freq: Once | RESPIRATORY_TRACT | Status: AC
  Administered 2012-02-12: 2.5 mg via RESPIRATORY_TRACT

## 2012-02-13 ENCOUNTER — Ambulatory Visit: Payer: BC Managed Care – PPO | Admitting: Physical Therapy

## 2012-02-13 ENCOUNTER — Ambulatory Visit: Payer: BC Managed Care – PPO

## 2012-02-19 ENCOUNTER — Ambulatory Visit: Payer: BC Managed Care – PPO | Admitting: Physical Therapy

## 2012-02-26 ENCOUNTER — Ambulatory Visit: Payer: BC Managed Care – PPO

## 2012-02-26 ENCOUNTER — Ambulatory Visit: Payer: BC Managed Care – PPO | Admitting: Physical Therapy

## 2012-03-01 ENCOUNTER — Encounter: Payer: Self-pay | Admitting: Pulmonary Disease

## 2012-03-02 ENCOUNTER — Encounter: Payer: Self-pay | Admitting: Pulmonary Disease

## 2012-03-02 ENCOUNTER — Ambulatory Visit (INDEPENDENT_AMBULATORY_CARE_PROVIDER_SITE_OTHER): Payer: BC Managed Care – PPO | Admitting: Pulmonary Disease

## 2012-03-02 VITALS — BP 140/86 | HR 67 | Temp 98.2°F | Ht 66.0 in | Wt 248.8 lb

## 2012-03-02 DIAGNOSIS — I272 Pulmonary hypertension, unspecified: Secondary | ICD-10-CM | POA: Insufficient documentation

## 2012-03-02 DIAGNOSIS — I2789 Other specified pulmonary heart diseases: Secondary | ICD-10-CM

## 2012-03-02 DIAGNOSIS — J984 Other disorders of lung: Secondary | ICD-10-CM

## 2012-03-02 NOTE — Assessment & Plan Note (Signed)
PA dilatation on CT chk echo - if mild pulmonary hypertension, then may pursue workup for sleep disorder breathing.

## 2012-03-02 NOTE — Patient Instructions (Signed)
Ambulatory satn Echo to check pulmonary pressures  You have to quit smoking - You have changes of emphysema on your lung scan

## 2012-03-02 NOTE — Assessment & Plan Note (Signed)
Along minor fissure, stable on FU CT- no further imaging required. Smoking cessation again emphasized Do not feel that she needs bronchodilator
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
## 2012-03-04 ENCOUNTER — Ambulatory Visit: Payer: BC Managed Care – PPO | Admitting: Physical Therapy

## 2012-03-04 ENCOUNTER — Ambulatory Visit: Payer: BC Managed Care – PPO | Attending: Physical Medicine & Rehabilitation

## 2012-03-04 DIAGNOSIS — M6281 Muscle weakness (generalized): Secondary | ICD-10-CM | POA: Insufficient documentation

## 2012-03-04 DIAGNOSIS — I69998 Other sequelae following unspecified cerebrovascular disease: Secondary | ICD-10-CM | POA: Insufficient documentation

## 2012-03-04 DIAGNOSIS — I69919 Unspecified symptoms and signs involving cognitive functions following unspecified cerebrovascular disease: Secondary | ICD-10-CM | POA: Insufficient documentation

## 2012-03-04 DIAGNOSIS — Z5189 Encounter for other specified aftercare: Secondary | ICD-10-CM | POA: Insufficient documentation

## 2012-03-04 DIAGNOSIS — R269 Unspecified abnormalities of gait and mobility: Secondary | ICD-10-CM | POA: Insufficient documentation

## 2012-03-05 ENCOUNTER — Ambulatory Visit (HOSPITAL_COMMUNITY): Payer: BC Managed Care – PPO | Attending: Pulmonary Disease | Admitting: Radiology

## 2012-03-05 DIAGNOSIS — F17200 Nicotine dependence, unspecified, uncomplicated: Secondary | ICD-10-CM | POA: Insufficient documentation

## 2012-03-05 DIAGNOSIS — R9430 Abnormal result of cardiovascular function study, unspecified: Secondary | ICD-10-CM

## 2012-03-05 DIAGNOSIS — I272 Pulmonary hypertension, unspecified: Secondary | ICD-10-CM

## 2012-03-05 DIAGNOSIS — I2789 Other specified pulmonary heart diseases: Secondary | ICD-10-CM | POA: Insufficient documentation

## 2012-03-05 DIAGNOSIS — E119 Type 2 diabetes mellitus without complications: Secondary | ICD-10-CM | POA: Insufficient documentation

## 2012-03-05 DIAGNOSIS — I059 Rheumatic mitral valve disease, unspecified: Secondary | ICD-10-CM | POA: Insufficient documentation

## 2012-03-05 DIAGNOSIS — I10 Essential (primary) hypertension: Secondary | ICD-10-CM | POA: Insufficient documentation

## 2012-03-05 DIAGNOSIS — J449 Chronic obstructive pulmonary disease, unspecified: Secondary | ICD-10-CM | POA: Insufficient documentation

## 2012-03-05 DIAGNOSIS — J4489 Other specified chronic obstructive pulmonary disease: Secondary | ICD-10-CM | POA: Insufficient documentation

## 2012-03-05 NOTE — Progress Notes (Signed)
Echocardiogram performed.  

## 2012-03-09 ENCOUNTER — Ambulatory Visit: Payer: BC Managed Care – PPO

## 2012-03-10 ENCOUNTER — Telehealth: Payer: Self-pay | Admitting: *Deleted

## 2012-03-10 MED ORDER — OXYCODONE HCL 20 MG PO TB12: 20.0000 mg | ORAL_TABLET | Freq: Two times a day (BID) | ORAL | Status: DC

## 2012-03-10 NOTE — Telephone Encounter (Signed)
Only needs the oxycontin. Still has about 20 of the IR. Rx given to Santiago Glad to sign.

## 2012-03-10 NOTE — Telephone Encounter (Signed)
Patient needs refill on Oxycontin

## 2012-03-11 ENCOUNTER — Ambulatory Visit: Payer: BC Managed Care – PPO | Admitting: Physical Therapy

## 2012-03-11 ENCOUNTER — Ambulatory Visit: Payer: BC Managed Care – PPO

## 2012-03-16 ENCOUNTER — Ambulatory Visit: Payer: BC Managed Care – PPO | Admitting: Physical Therapy

## 2012-03-16 ENCOUNTER — Ambulatory Visit: Payer: BC Managed Care – PPO

## 2012-03-23 ENCOUNTER — Ambulatory Visit: Payer: BC Managed Care – PPO

## 2012-03-24 ENCOUNTER — Ambulatory Visit: Payer: BC Managed Care – PPO | Admitting: Speech Pathology

## 2012-03-25 ENCOUNTER — Ambulatory Visit: Payer: BC Managed Care – PPO

## 2012-03-30 ENCOUNTER — Ambulatory Visit: Payer: BC Managed Care – PPO

## 2012-03-31 ENCOUNTER — Ambulatory Visit: Payer: BC Managed Care – PPO

## 2012-04-05 ENCOUNTER — Encounter: Payer: BC Managed Care – PPO | Admitting: Physical Medicine & Rehabilitation

## 2012-04-05 ENCOUNTER — Telehealth: Payer: Self-pay

## 2012-04-05 NOTE — Telephone Encounter (Signed)
Patients appointment has been rescheduled due to Dr Naaman Plummer being out sick.  She needs her oxycodone 22m filled.

## 2012-04-06 NOTE — Telephone Encounter (Signed)
Was she rescheduled, we can give her the prescription if she has made another appointment

## 2012-04-06 NOTE — Telephone Encounter (Signed)
Patient called back to clarify that is her oxycodone that she takes every 6 hours.

## 2012-04-06 NOTE — Telephone Encounter (Signed)
Patient needs oxycodone 10 mg filled.  Please advise.

## 2012-04-06 NOTE — Telephone Encounter (Signed)
Left VM to clarify which medication she is needing refilled.

## 2012-04-07 ENCOUNTER — Ambulatory Visit: Payer: BC Managed Care – PPO | Attending: Physical Medicine & Rehabilitation | Admitting: Speech Pathology

## 2012-04-07 DIAGNOSIS — M6281 Muscle weakness (generalized): Secondary | ICD-10-CM | POA: Insufficient documentation

## 2012-04-07 DIAGNOSIS — I69998 Other sequelae following unspecified cerebrovascular disease: Secondary | ICD-10-CM | POA: Insufficient documentation

## 2012-04-07 DIAGNOSIS — Z5189 Encounter for other specified aftercare: Secondary | ICD-10-CM | POA: Insufficient documentation

## 2012-04-07 DIAGNOSIS — R269 Unspecified abnormalities of gait and mobility: Secondary | ICD-10-CM | POA: Insufficient documentation

## 2012-04-07 DIAGNOSIS — I69919 Unspecified symptoms and signs involving cognitive functions following unspecified cerebrovascular disease: Secondary | ICD-10-CM | POA: Insufficient documentation

## 2012-04-08 ENCOUNTER — Other Ambulatory Visit: Payer: Self-pay

## 2012-04-08 MED ORDER — OXYCODONE HCL 10 MG PO TABS: 10.0000 mg | ORAL_TABLET | Freq: Four times a day (QID) | ORAL | Status: DC | PRN

## 2012-04-08 NOTE — Telephone Encounter (Signed)
Left message for patient that her know that her oxycodone script is ready for pick up.

## 2012-04-14 ENCOUNTER — Encounter: Payer: Self-pay | Admitting: Physical Medicine & Rehabilitation

## 2012-04-14 ENCOUNTER — Encounter
Payer: BC Managed Care – PPO | Attending: Physical Medicine & Rehabilitation | Admitting: Physical Medicine & Rehabilitation

## 2012-04-14 VITALS — BP 158/83 | HR 72 | Ht 66.0 in | Wt 253.0 lb

## 2012-04-14 DIAGNOSIS — S7010XA Contusion of unspecified thigh, initial encounter: Secondary | ICD-10-CM

## 2012-04-14 DIAGNOSIS — S8410XA Injury of peroneal nerve at lower leg level, unspecified leg, initial encounter: Secondary | ICD-10-CM | POA: Insufficient documentation

## 2012-04-14 DIAGNOSIS — S066X9A Traumatic subarachnoid hemorrhage with loss of consciousness of unspecified duration, initial encounter: Secondary | ICD-10-CM

## 2012-04-14 DIAGNOSIS — T1490XA Injury, unspecified, initial encounter: Secondary | ICD-10-CM | POA: Insufficient documentation

## 2012-04-14 DIAGNOSIS — X58XXXA Exposure to other specified factors, initial encounter: Secondary | ICD-10-CM | POA: Insufficient documentation

## 2012-04-14 DIAGNOSIS — R609 Edema, unspecified: Secondary | ICD-10-CM | POA: Insufficient documentation

## 2012-04-14 DIAGNOSIS — S066XAA Traumatic subarachnoid hemorrhage with loss of consciousness status unknown, initial encounter: Secondary | ICD-10-CM

## 2012-04-14 DIAGNOSIS — Z5189 Encounter for other specified aftercare: Secondary | ICD-10-CM | POA: Insufficient documentation

## 2012-04-14 MED ORDER — GABAPENTIN 100 MG PO CAPS: 100.0000 mg | ORAL_CAPSULE | Freq: Every day | ORAL | Status: DC
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
## 2012-04-14 NOTE — Patient Instructions (Addendum)
Call me with questions  GABAPENTIN: TAKE ONE AT BEDTIME FOR 3 DAYS, THEN IF NEEDED, INCREASE TO TWO AT BEDTIME FOR 3 DAYS, THEN IF NEEDED, INCREASE TO 3 AT BEDTIME.

## 2012-04-15 ENCOUNTER — Ambulatory Visit: Payer: BC Managed Care – PPO

## 2012-04-20 ENCOUNTER — Ambulatory Visit: Payer: BC Managed Care – PPO | Admitting: Speech Pathology

## 2012-04-22 ENCOUNTER — Ambulatory Visit: Payer: BC Managed Care – PPO | Admitting: Speech Pathology

## 2012-04-23 ENCOUNTER — Telehealth: Payer: Self-pay

## 2012-04-23 NOTE — Telephone Encounter (Signed)
Ann with [employer] needs specific directions on how patient is supposed to start back to work.  Letter was written and will fax the letter to [phone] claim #[id number].  Left message for patient to call office to let us know if she needs letter faxed to her employer as well.

## 2012-04-27 ENCOUNTER — Telehealth: Payer: Self-pay

## 2012-04-27 ENCOUNTER — Ambulatory Visit (HOSPITAL_COMMUNITY)
Admission: RE | Admit: 2012-04-27 | Discharge: 2012-04-27 | Disposition: A | Discharge status: Home / Self Care | Payer: BC Managed Care – PPO | Source: Ambulatory Visit | Attending: Physical Medicine & Rehabilitation | Admitting: Physical Medicine & Rehabilitation

## 2012-04-27 ENCOUNTER — Encounter: Payer: Self-pay | Admitting: Physical Medicine & Rehabilitation

## 2012-04-27 ENCOUNTER — Encounter (HOSPITAL_BASED_OUTPATIENT_CLINIC_OR_DEPARTMENT_OTHER): Payer: BC Managed Care – PPO | Admitting: Physical Medicine & Rehabilitation

## 2012-04-27 VITALS — BP 163/90 | HR 75 | Resp 14 | Ht 66.0 in | Wt 259.0 lb

## 2012-04-27 DIAGNOSIS — M79609 Pain in unspecified limb: Secondary | ICD-10-CM | POA: Insufficient documentation

## 2012-04-27 DIAGNOSIS — T1490XA Injury, unspecified, initial encounter: Secondary | ICD-10-CM

## 2012-04-27 DIAGNOSIS — R609 Edema, unspecified: Secondary | ICD-10-CM

## 2012-04-27 DIAGNOSIS — Z5189 Encounter for other specified aftercare: Secondary | ICD-10-CM

## 2012-04-27 DIAGNOSIS — S8412XD Injury of peroneal nerve at lower leg level, left leg, subsequent encounter: Secondary | ICD-10-CM

## 2012-04-27 DIAGNOSIS — S7012XD Contusion of left thigh, subsequent encounter: Secondary | ICD-10-CM

## 2012-04-27 DIAGNOSIS — R600 Localized edema: Secondary | ICD-10-CM | POA: Insufficient documentation

## 2012-04-27 MED ORDER — OXYCODONE HCL ER 20 MG PO T12A: 20.0000 mg | EXTENDED_RELEASE_TABLET | Freq: Two times a day (BID) | ORAL | Status: DC

## 2012-04-27 MED ORDER — GABAPENTIN 300 MG PO CAPS: 300.0000 mg | ORAL_CAPSULE | Freq: Three times a day (TID) | ORAL | Status: DC

## 2012-04-27 MED ORDER — OXYCODONE HCL 10 MG PO TABS: 10.0000 mg | ORAL_TABLET | Freq: Four times a day (QID) | ORAL | Status: DC | PRN

## 2012-04-27 NOTE — Patient Instructions (Signed)
TAKE GABAPENTIN TWO X PER DAY FOR 4 DAYS THEN INCREASE TO THREE X PER DAY THEREAFTER
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
## 2012-04-27 NOTE — Progress Notes (Signed)
*  Preliminary Results* Bilateral lower extremity venous duplex completed. Bilateral lower extremities are negative for deep vein thrombosis. There is no evidence of Baker's cyst bilaterally. Attempted to call preliminary results in to Dr.Swartz's office, left message on answering machine and released patient to go home. If there are any further instructions for the patient she can be reached by phone.  04/27/2012 11:45 AM Maudry Mayhew, RDMS, RDCS

## 2012-04-27 NOTE — Telephone Encounter (Signed)
Vascular lab called to say patient does not have a DVT.  They will send the report.  Dr Mamie Laurel informed.

## 2012-04-28 ENCOUNTER — Ambulatory Visit: Payer: BC Managed Care – PPO | Admitting: Speech Pathology

## 2012-04-30 ENCOUNTER — Ambulatory Visit: Payer: BC Managed Care – PPO

## 2012-05-04 ENCOUNTER — Ambulatory Visit: Payer: BC Managed Care – PPO | Admitting: Speech Pathology

## 2012-05-06 ENCOUNTER — Ambulatory Visit: Payer: BC Managed Care – PPO | Admitting: Speech Pathology

## 2012-05-11 ENCOUNTER — Ambulatory Visit: Payer: BC Managed Care – PPO | Admitting: Physical Medicine & Rehabilitation

## 2012-05-12 ENCOUNTER — Ambulatory Visit: Payer: BC Managed Care – PPO | Admitting: Speech Pathology

## 2012-05-14 ENCOUNTER — Ambulatory Visit: Payer: BC Managed Care – PPO

## 2012-05-25 ENCOUNTER — Telehealth: Payer: Self-pay

## 2012-05-25 ENCOUNTER — Encounter: Payer: Self-pay | Admitting: Physical Medicine & Rehabilitation

## 2012-05-25 ENCOUNTER — Encounter
Payer: BC Managed Care – PPO | Attending: Physical Medicine & Rehabilitation | Admitting: Physical Medicine & Rehabilitation

## 2012-05-25 VITALS — BP 190/105 | HR 81 | Resp 16 | Ht 66.0 in | Wt 167.4 lb

## 2012-05-25 DIAGNOSIS — T1490XA Injury, unspecified, initial encounter: Secondary | ICD-10-CM

## 2012-05-25 DIAGNOSIS — R609 Edema, unspecified: Secondary | ICD-10-CM

## 2012-05-25 DIAGNOSIS — X58XXXA Exposure to other specified factors, initial encounter: Secondary | ICD-10-CM | POA: Insufficient documentation

## 2012-05-25 DIAGNOSIS — Z5181 Encounter for therapeutic drug level monitoring: Secondary | ICD-10-CM

## 2012-05-25 DIAGNOSIS — S7012XD Contusion of left thigh, subsequent encounter: Secondary | ICD-10-CM

## 2012-05-25 DIAGNOSIS — S8290XD Unspecified fracture of unspecified lower leg, subsequent encounter for closed fracture with routine healing: Secondary | ICD-10-CM

## 2012-05-25 DIAGNOSIS — R600 Localized edema: Secondary | ICD-10-CM

## 2012-05-25 DIAGNOSIS — Z79899 Other long term (current) drug therapy: Secondary | ICD-10-CM

## 2012-05-25 DIAGNOSIS — S8412XD Injury of peroneal nerve at lower leg level, left leg, subsequent encounter: Secondary | ICD-10-CM

## 2012-05-25 DIAGNOSIS — S066X9D Traumatic subarachnoid hemorrhage with loss of consciousness of unspecified duration, subsequent encounter: Secondary | ICD-10-CM

## 2012-05-25 DIAGNOSIS — S82402D Unspecified fracture of shaft of left fibula, subsequent encounter for closed fracture with routine healing: Secondary | ICD-10-CM

## 2012-05-25 DIAGNOSIS — G5710 Meralgia paresthetica, unspecified lower limb: Secondary | ICD-10-CM | POA: Insufficient documentation

## 2012-05-25 DIAGNOSIS — M12579 Traumatic arthropathy, unspecified ankle and foot: Secondary | ICD-10-CM

## 2012-05-25 DIAGNOSIS — G5712 Meralgia paresthetica, left lower limb: Secondary | ICD-10-CM

## 2012-05-25 DIAGNOSIS — M19172 Post-traumatic osteoarthritis, left ankle and foot: Secondary | ICD-10-CM

## 2012-05-25 DIAGNOSIS — Z5189 Encounter for other specified aftercare: Secondary | ICD-10-CM

## 2012-05-25 DIAGNOSIS — M19179 Post-traumatic osteoarthritis, unspecified ankle and foot: Secondary | ICD-10-CM | POA: Insufficient documentation

## 2012-05-25 MED ORDER — GABAPENTIN 300 MG PO CAPS: 300.0000 mg | ORAL_CAPSULE | Freq: Four times a day (QID) | ORAL | Status: DC

## 2012-05-25 MED ORDER — OXYCODONE HCL ER 20 MG PO T12A: 20.0000 mg | EXTENDED_RELEASE_TABLET | Freq: Two times a day (BID) | ORAL | Status: DC

## 2012-05-25 NOTE — Addendum Note (Signed)
Addended by: Amado Coe on: 05/25/2012 01:36 PM   Modules accepted: Orders

## 2012-05-25 NOTE — Telephone Encounter (Signed)
Patient wanted to let Dr Naaman Plummer know that when she wakes up in the morning she has a headache and black eye.  She ment to discuss this at her last appointment but forgot.  Is there something she needs to be concerned about?

## 2012-05-25 NOTE — Telephone Encounter (Signed)
Observe for now and make changes as I described today. Not sure why she would have a transient "black eye"

## 2012-05-25 NOTE — Telephone Encounter (Signed)
Patient informed to observe headaches and black eye.  She will return in a month.

## 2012-05-25 NOTE — Patient Instructions (Signed)
Please work on regular exercise and posture at home.
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
## 2012-06-22 ENCOUNTER — Ambulatory Visit (INDEPENDENT_AMBULATORY_CARE_PROVIDER_SITE_OTHER): Payer: BC Managed Care – PPO | Admitting: Pulmonary Disease

## 2012-06-22 ENCOUNTER — Encounter: Payer: Self-pay | Admitting: Pulmonary Disease

## 2012-06-22 VITALS — BP 124/78 | HR 71 | Temp 97.3°F | Ht 66.0 in | Wt 263.0 lb

## 2012-06-22 DIAGNOSIS — I272 Pulmonary hypertension, unspecified: Secondary | ICD-10-CM

## 2012-06-22 DIAGNOSIS — F17200 Nicotine dependence, unspecified, uncomplicated: Secondary | ICD-10-CM

## 2012-06-22 DIAGNOSIS — G4733 Obstructive sleep apnea (adult) (pediatric): Secondary | ICD-10-CM | POA: Insufficient documentation

## 2012-06-22 DIAGNOSIS — Z720 Tobacco use: Secondary | ICD-10-CM | POA: Insufficient documentation

## 2012-06-22 DIAGNOSIS — I2789 Other specified pulmonary heart diseases: Secondary | ICD-10-CM

## 2012-06-22 NOTE — Patient Instructions (Signed)
You can use nicotine patch 63m daily in addition to chantix If this does not work, [place] Sleep study will be scheduled

## 2012-06-22 NOTE — Assessment & Plan Note (Signed)
Given excessive daytime somnolence, narrow pharyngeal exam, witnessed apneas & loud snoring, obstructive sleep apnea is very likely & an overnight polysomnogram will be scheduled as a split study. The pathophysiology of obstructive sleep apnea , it's cardiovascular consequences & modes of treatment including CPAP were discused with the patient in detail & they evidenced understanding.  

## 2012-06-22 NOTE — Assessment & Plan Note (Signed)
You can use nicotine patch 42m daily in addition to chantix If this does not work, call [place] inhaler

## 2012-06-22 NOTE — Assessment & Plan Note (Signed)
PA dilatation on CT RVSP 33 on echo No intervention
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
## 2012-06-24 ENCOUNTER — Encounter (HOSPITAL_BASED_OUTPATIENT_CLINIC_OR_DEPARTMENT_OTHER): Payer: Self-pay | Admitting: Emergency Medicine

## 2012-06-24 ENCOUNTER — Emergency Department (HOSPITAL_BASED_OUTPATIENT_CLINIC_OR_DEPARTMENT_OTHER)
Admission: EM | Admit: 2012-06-24 | Discharge: 2012-06-25 | Disposition: A | Discharge status: Home / Self Care | Payer: BC Managed Care – PPO | Attending: Emergency Medicine | Admitting: Emergency Medicine

## 2012-06-24 DIAGNOSIS — Z872 Personal history of diseases of the skin and subcutaneous tissue: Secondary | ICD-10-CM | POA: Insufficient documentation

## 2012-06-24 DIAGNOSIS — Z79899 Other long term (current) drug therapy: Secondary | ICD-10-CM | POA: Insufficient documentation

## 2012-06-24 DIAGNOSIS — G43909 Migraine, unspecified, not intractable, without status migrainosus: Secondary | ICD-10-CM | POA: Insufficient documentation

## 2012-06-24 DIAGNOSIS — Z862 Personal history of diseases of the blood and blood-forming organs and certain disorders involving the immune mechanism: Secondary | ICD-10-CM | POA: Insufficient documentation

## 2012-06-24 DIAGNOSIS — Z8619 Personal history of other infectious and parasitic diseases: Secondary | ICD-10-CM | POA: Insufficient documentation

## 2012-06-24 DIAGNOSIS — E119 Type 2 diabetes mellitus without complications: Secondary | ICD-10-CM | POA: Insufficient documentation

## 2012-06-24 DIAGNOSIS — K1379 Other lesions of oral mucosa: Secondary | ICD-10-CM

## 2012-06-24 DIAGNOSIS — I10 Essential (primary) hypertension: Secondary | ICD-10-CM | POA: Insufficient documentation

## 2012-06-24 DIAGNOSIS — Z794 Long term (current) use of insulin: Secondary | ICD-10-CM | POA: Insufficient documentation

## 2012-06-24 DIAGNOSIS — Z8669 Personal history of other diseases of the nervous system and sense organs: Secondary | ICD-10-CM | POA: Insufficient documentation

## 2012-06-24 DIAGNOSIS — F411 Generalized anxiety disorder: Secondary | ICD-10-CM | POA: Insufficient documentation

## 2012-06-24 DIAGNOSIS — F17200 Nicotine dependence, unspecified, uncomplicated: Secondary | ICD-10-CM | POA: Insufficient documentation

## 2012-06-24 DIAGNOSIS — Z8781 Personal history of (healed) traumatic fracture: Secondary | ICD-10-CM | POA: Insufficient documentation

## 2012-06-24 DIAGNOSIS — Z87448 Personal history of other diseases of urinary system: Secondary | ICD-10-CM | POA: Insufficient documentation

## 2012-06-24 DIAGNOSIS — F329 Major depressive disorder, single episode, unspecified: Secondary | ICD-10-CM | POA: Insufficient documentation

## 2012-06-24 DIAGNOSIS — E785 Hyperlipidemia, unspecified: Secondary | ICD-10-CM | POA: Insufficient documentation

## 2012-06-24 DIAGNOSIS — K1370 Unspecified lesions of oral mucosa: Secondary | ICD-10-CM | POA: Insufficient documentation

## 2012-06-24 DIAGNOSIS — J449 Chronic obstructive pulmonary disease, unspecified: Secondary | ICD-10-CM | POA: Insufficient documentation

## 2012-06-24 DIAGNOSIS — J4489 Other specified chronic obstructive pulmonary disease: Secondary | ICD-10-CM | POA: Insufficient documentation

## 2012-06-24 DIAGNOSIS — F3289 Other specified depressive episodes: Secondary | ICD-10-CM | POA: Insufficient documentation

## 2012-06-24 LAB — COMPREHENSIVE METABOLIC PANEL
AST: 56 U/L — ABNORMAL HIGH (ref 0–37)
Albumin: 3.1 g/dL — ABNORMAL LOW (ref 3.5–5.2)
BUN: 15 mg/dL (ref 6–23)
CO2: 31 mEq/L (ref 19–32)
Calcium: 9.4 mg/dL (ref 8.4–10.5)
Creatinine, Ser: 0.8 mg/dL (ref 0.50–1.10)
GFR calc non Af Amer: 81 mL/min — ABNORMAL LOW (ref >90)
Total Bilirubin: 0.2 mg/dL — ABNORMAL LOW (ref 0.3–1.2)

## 2012-06-24 LAB — CBC WITH DIFFERENTIAL/PLATELET
Basophils Absolute: 0 10*3/uL (ref 0.0–0.1)
Basophils Relative: 0 % (ref 0–1)
Eosinophils Relative: 3 % (ref 0–5)
HCT: 44.3 % (ref 36.0–46.0)
Hemoglobin: 14.4 g/dL (ref 12.0–15.0)
MCHC: 32.5 g/dL (ref 30.0–36.0)
MCV: 84.2 fL (ref 78.0–100.0)
Monocytes Absolute: 0.8 10*3/uL (ref 0.1–1.0)
Monocytes Relative: 11 % (ref 3–12)
RDW: 13.7 % (ref 11.5–15.5)

## 2012-06-24 LAB — PROTIME-INR
INR: 1.08 (ref 0.00–1.49)
Prothrombin Time: 13.9 seconds (ref 11.6–15.2)

## 2012-06-24 NOTE — ED Provider Notes (Signed)
History     CSN: [id number]  Arrival date & time 06/24/12  2115   First MD Initiated Contact with Patient 06/24/12 2221      Chief Complaint  Patient presents with  . Hemoptysis    (Consider location/radiation/quality/duration/timing/severity/associated sxs/prior treatment) HPI Comments: Patient presents here with complaints of bleeding in her mouth.  This started earlier today and seems to occur when she eats or drinks.  She denies coughing or gagging and reports that the blood is just there.  No pain.  No difficulty swallowing or shortness of breath.  Never happened before.    The history is provided by the patient.    Past Medical History  Diagnosis Date  . Diabetes mellitus   . Hypertension   . Hyperlipidemia   . Anxiety   . Dermatitis   . Morbid obesity   . Proteinuria   . Migraine headache   . Overactive bladder   . Depression   . Sleep apnea   . H/O Clostridium difficile infection   . COPD (chronic obstructive pulmonary disease)   . Brain bleed     October 26, 2011  . Broken leg     Left leg. Aug 2013  . Ruptured spleen   . Kidney disorder     Past Surgical History  Procedure Laterality Date  . Appendectomy    . Cholecystectomy    . Spine surgery    . Foot surgery    . Abdominal hysterectomy      total with BSO  . Colonoscopy  01/2007  . Tubal ligation      Family History  Problem Relation Age of Onset  . Heart disease Mother   . Colon polyps Mother   . Coronary artery disease Mother   . Aortic stenosis Mother   . Kidney failure Mother   . Lung cancer Father     lung  . Hypertension Sister   . Hypertension Brother   . Sarcoidosis Brother   . Other Brother     heart valve issues    History  Substance Use Topics  . Smoking status: Current Every Day Smoker -- 1.00 packs/day for 37 years    Types: Cigarettes  . Smokeless tobacco: Never Used  . Alcohol Use: No    OB History   Grav Para Term Preterm Abortions TAB SAB Ect Mult Living                   Review of Systems  All other systems reviewed and are negative.    Allergies  Review of patient's allergies indicates no known allergies.  Home Medications   Current Outpatient Rx  Name  Route  Sig  Dispense  Refill  . cholecalciferol (VITAMIN D) 1000 UNITS tablet   Oral   Take 1,000 Units by mouth daily.         . clonazePAM (KLONOPIN) 0.5 MG tablet   Oral   Take 0.5 mg by mouth 2 (two) times daily.          . diclofenac sodium (VOLTAREN) 1 % GEL   Topical   Apply 2 g topically 4 (four) times daily.   2 Tube   2   . DULoxetine (CYMBALTA) 60 MG capsule   Oral   Take 60 mg by mouth daily.         Marland Kitchen ezetimibe (ZETIA) 10 MG tablet   Oral   Take 10 mg by mouth daily.         Marland Kitchen  gabapentin (NEURONTIN) 300 MG capsule   Oral   Take 1 capsule (300 mg total) by mouth 4 (four) times daily. One pill at breakfast in afternoon, and two pills at bedtime   90 capsule   3   . insulin glargine (LANTUS) 100 UNIT/ML injection   Subcutaneous   Inject 6-10 Units into the skin at bedtime. Per sliding scale         . lisinopril (PRINIVIL,ZESTRIL) 10 MG tablet   Oral   Take 10 mg by mouth daily.         . mirtazapine (REMERON) 30 MG tablet   Oral   Take 30 mg by mouth at bedtime.         . nebivolol (BYSTOLIC) 10 MG tablet   Oral   Take 10 mg by mouth daily.         . OxyCODONE (OXYCONTIN) 20 mg T12A   Oral   Take 1 tablet (20 mg total) by mouth every 12 (twelve) hours.   60 tablet   0   . pantoprazole (PROTONIX) 40 MG tablet   Oral   Take 40 mg by mouth daily.         . rizatriptan (MAXALT-MLT) 10 MG disintegrating tablet      as needed.          . simvastatin (ZOCOR) 40 MG tablet   Oral   Take 20 mg by mouth at bedtime.          Marland Kitchen FREESTYLE LITE test strip               . nitroGLYCERIN (NITROSTAT) 0.4 MG SL tablet   Sublingual   Place 0.4 mg under the tongue every 5 (five) minutes x 2 doses as needed. For chest pain            BP 148/112  Pulse 70  Temp(Src) 98.2 F (36.8 C) (Oral)  Resp 16  Ht 5' 6"  (1.676 m)  Wt 263 lb (119.296 kg)  BMI 42.47 kg/m2  SpO2 95%  Physical Exam  Nursing note and vitals reviewed. Constitutional: She is oriented to person, place, and time. She appears well-developed and well-nourished. No distress.  HENT:  Head: Normocephalic and atraumatic.  Mouth/Throat: Oropharynx is clear and moist.  The oral and nasal mucosa are unremarkable in appearance.  There are no sites of bleeding or lesions.    Neck: Normal range of motion. Neck supple.  Cardiovascular: Normal rate and regular rhythm.  Exam reveals no gallop and no friction rub.   No murmur heard. Pulmonary/Chest: Effort normal and breath sounds normal. No respiratory distress. She has no wheezes.  Abdominal: Soft. Bowel sounds are normal. She exhibits no distension. There is no tenderness.  Musculoskeletal: Normal range of motion.  Lymphadenopathy:    She has no cervical adenopathy.  Neurological: She is alert and oriented to person, place, and time.  Skin: Skin is warm and dry. She is not diaphoretic.    ED Course  Procedures (including critical care time)  Labs Reviewed  CBC WITH DIFFERENTIAL - Abnormal; Notable for the following:    RBC 5.26 (*)    All other components within normal limits  COMPREHENSIVE METABOLIC PANEL - Abnormal; Notable for the following:    Albumin 3.1 (*)    AST 56 (*)    ALT 36 (*)    Alkaline Phosphatase 140 (*)    Total Bilirubin 0.2 (*)    GFR calc non Af Amer 81 (*)  All other components within normal limits  PROTIME-INR   No results found.   1. Oral bleeding       MDM  Labs are unremarkable and Hb and BP is stable.  I have spoken with Dr. Deatra Ina from GI who feels as though she should be seen by ENT before he looks into her esophagus or stomach.  As there is no gagging or vomiting he feels as though this is unlikely to be esophageal or gastric.  Dr. Redmond Baseman from ENT  agrees and wants patient to call the office in the morning to make an appointment for tomorrow.  She will be discharged, to return prn if her symptoms worsen.        Veryl Speak, MD 06/24/12 [phone]

## 2012-06-24 NOTE — ED Notes (Signed)
Pt states she has been having blood come out of her mouth since 830 this morning. Pt states the blood "comes up her esophagus and out her mouth after every meal". Breakfast, lunch, and dinner. Pt states the blood is bright red. Pt states she feels like she has to spit constantly. Pt states she vomited at the restaurant tonight.

## 2012-06-28 ENCOUNTER — Encounter: Payer: Self-pay | Admitting: Physical Medicine & Rehabilitation

## 2012-06-28 ENCOUNTER — Encounter
Payer: BC Managed Care – PPO | Attending: Physical Medicine & Rehabilitation | Admitting: Physical Medicine & Rehabilitation

## 2012-06-28 VITALS — BP 169/92 | HR 74 | Resp 17 | Ht 66.0 in | Wt 264.2 lb

## 2012-06-28 DIAGNOSIS — T1490XA Injury, unspecified, initial encounter: Secondary | ICD-10-CM | POA: Insufficient documentation

## 2012-06-28 DIAGNOSIS — M12579 Traumatic arthropathy, unspecified ankle and foot: Secondary | ICD-10-CM

## 2012-06-28 DIAGNOSIS — R609 Edema, unspecified: Secondary | ICD-10-CM | POA: Insufficient documentation

## 2012-06-28 DIAGNOSIS — S066X9D Traumatic subarachnoid hemorrhage with loss of consciousness of unspecified duration, subsequent encounter: Secondary | ICD-10-CM

## 2012-06-28 DIAGNOSIS — M19172 Post-traumatic osteoarthritis, left ankle and foot: Secondary | ICD-10-CM

## 2012-06-28 DIAGNOSIS — G5710 Meralgia paresthetica, unspecified lower limb: Secondary | ICD-10-CM

## 2012-06-28 DIAGNOSIS — Z5189 Encounter for other specified aftercare: Secondary | ICD-10-CM | POA: Insufficient documentation

## 2012-06-28 DIAGNOSIS — S82402D Unspecified fracture of shaft of left fibula, subsequent encounter for closed fracture with routine healing: Secondary | ICD-10-CM

## 2012-06-28 DIAGNOSIS — X58XXXA Exposure to other specified factors, initial encounter: Secondary | ICD-10-CM | POA: Insufficient documentation

## 2012-06-28 DIAGNOSIS — S8290XD Unspecified fracture of unspecified lower leg, subsequent encounter for closed fracture with routine healing: Secondary | ICD-10-CM

## 2012-06-28 DIAGNOSIS — S8412XD Injury of peroneal nerve at lower leg level, left leg, subsequent encounter: Secondary | ICD-10-CM

## 2012-06-28 DIAGNOSIS — S7012XD Contusion of left thigh, subsequent encounter: Secondary | ICD-10-CM

## 2012-06-28 DIAGNOSIS — G5712 Meralgia paresthetica, left lower limb: Secondary | ICD-10-CM

## 2012-06-28 DIAGNOSIS — R600 Localized edema: Secondary | ICD-10-CM

## 2012-06-28 MED ORDER — DICLOFENAC SODIUM 1 % TD GEL: 2.0000 g | Freq: Four times a day (QID) | TRANSDERMAL | Status: DC

## 2012-06-28 MED ORDER — OXYCODONE HCL ER 20 MG PO T12A: 20.0000 mg | EXTENDED_RELEASE_TABLET | Freq: Two times a day (BID) | ORAL | Status: DC

## 2012-06-28 NOTE — Patient Instructions (Signed)
CALL ME WITH ANY PROBLEMS OR QUESTIONS (#[phone]).  HAVE A GOOD DAY  

## 2012-06-28 NOTE — Progress Notes (Signed)
Subjective:    Patient ID: Katherine [name], female    DOB: October 16, 1955, 57 y.o.   MRN: [id number]  HPI  Katherine [name] is back regarding her TBI and polytrauma. She has done well with the change to her gabapentin and the scheduled oxycontin. She is using the voltaren gel to her ankle. Her pain is minimal today.  She was in the ED Thursday after coughing up blood with associated stomach irritation. Work up was negative. She was referred to ENT and they found nothing either. She will see a gastroenterologist for work up as well. She is still having some GI discomfort particularly in the left upper quadrant. She occasionally has some nausea as well. She is on protonix still.  She remains out of work but has interest in vocational re-entry although she knows she can't return to her prior job.    Pain Inventory Average Pain 1 Pain Right Now 0 My pain is dull  In the last 24 hours, has pain interfered with the following? General activity 0 Relation with others 1 Enjoyment of life 1 What TIME of day is your pain at its worst? evening Sleep (in general) Good  Pain is worse with: walking and standing Pain improves with: medication Relief from Meds: 10  Mobility walk without assistance do you drive?  yes  Function not employed: date last employed n/a  Neuro/Psych weakness  Prior Studies Any changes since last visit?  no  Physicians involved in your care Any changes since last visit?  no   Family History  Problem Relation Age of Onset  . Heart disease Mother   . Colon polyps Mother   . Coronary artery disease Mother   . Aortic stenosis Mother   . Kidney failure Mother   . Lung cancer Father     lung  . Hypertension Sister   . Hypertension Brother   . Sarcoidosis Brother   . Other Brother     heart valve issues   History   Social History  . Marital Status: Widowed    Spouse Name: N/A    Number of Children: 2  . Years of Education: N/A   Occupational History  .   [employer]   Social History Main Topics  . Smoking status: Current Every Day Smoker -- 1.00 packs/day for 37 years    Types: Cigarettes  . Smokeless tobacco: Never Used  . Alcohol Use: No  . Drug Use: No  . Sexually Active: None   Other Topics Concern  . None   Social History Narrative  . None   Past Surgical History  Procedure Laterality Date  . Appendectomy    . Cholecystectomy    . Spine surgery    . Foot surgery    . Abdominal hysterectomy      total with BSO  . Colonoscopy  01/2007  . Tubal ligation     Past Medical History  Diagnosis Date  . Diabetes mellitus   . Hypertension   . Hyperlipidemia   . Anxiety   . Dermatitis   . Morbid obesity   . Proteinuria   . Migraine headache   . Overactive bladder   . Depression   . Sleep apnea   . H/O Clostridium difficile infection   . COPD (chronic obstructive pulmonary disease)   . Brain bleed     October 26, 2011  . Broken leg     Left leg. Aug 2013  . Ruptured spleen   . Kidney disorder  BP 169/92  Pulse 74  Resp 17  Ht 5' 6"  (1.676 m)  Wt 264 lb 3.2 oz (119.84 kg)  BMI 42.66 kg/m2  SpO2 93%     Review of Systems  Constitutional: Positive for diaphoresis.  Respiratory: Positive for wheezing.   Gastrointestinal: Positive for abdominal pain.  Neurological: Positive for weakness.  All other systems reviewed and are negative.       Objective:   Physical Exam  General: Alert and oriented x 3, No apparent distress. obese  HEENT: Head is normocephalic, atraumatic, PERRLA, EOMI, sclera anicteric, oral mucosa pink and moist, dentition intact, ext ear canals clear,  Neck: Supple without JVD or lymphadenopathy  Heart: Reg rate and rhythm. No murmurs rubs or gallops  Chest: CTA bilaterally without wheezes, rales, or rhonchi; no distress  Abdomen: Soft, non-tender, non-distended, bowel sounds positive.  Extremities: No clubbing, cyanosis, 1+ to 2+ edema left thigh,calf. Pulses are 2+  Skin:  Clean and intact without signs of breakdown  Neuro: Pt is cognitively appropriate today with functional insight, awareness, and attention. Memory is intact. Cranial nerves 2-12 are intact. Sensory exam is decreased at the webspace of the 1st and 2nd toes. She has 2-2+/5 ADF on the left, AEV is 2+ also. Reflexes are 2+ in all 4's. Fine motor coordination is intact. No tremors. Motor function is grossly 5/5 other than the left ankle and parts of the proximal left leg (pain). She is not antalgic with weight bearing on the left. Left leg is much less sensitive to touch as a whole. She has mild point tenderness over the anterior ankle which was worse with wb as well as ROM of the ankle.. She is wearing her left AFO today. Musculoskeletal: see above.  Psych: Pt's affect is appropriate. Pt is cooperative  Assessment & Plan:   1. TBI with polytrauma with SAH  2. Displaced left distal fibula fracture, bilateral pubic symphysis fracture, rib fx's  3. Hx of left thigh hematoma  4. Left peroneal nerve injury which has shown great improvement.  5. Persistent left thigh pain- likely meralgia paresthetica  6. 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 Continue  gabapentin to 334m bid and 600 qhs.  2. She remains out of work indefinitely although I think she would be appropriate for a sedentary. 3. . Refilled her oxy cr q12. She did not need the IR  4.continue voltaren gel to the left ankle/knee on a QID basis. As well as ice, appropriate posture and technique.  .  5. Follow up with KSantiago Gladour PA in one month.. 30 minutes of face to face patient care time were spent during this visit. All questions were encouraged and answered.

## 2012-07-15 ENCOUNTER — Ambulatory Visit (HOSPITAL_BASED_OUTPATIENT_CLINIC_OR_DEPARTMENT_OTHER): Payer: BC Managed Care – PPO | Attending: Pulmonary Disease

## 2012-07-15 VITALS — Ht 66.0 in | Wt 263.0 lb

## 2012-07-15 DIAGNOSIS — G4733 Obstructive sleep apnea (adult) (pediatric): Secondary | ICD-10-CM

## 2012-07-19 DIAGNOSIS — G4730 Sleep apnea, unspecified: Secondary | ICD-10-CM

## 2012-07-19 DIAGNOSIS — G4710 Hypersomnia, unspecified: Secondary | ICD-10-CM

## 2012-07-20 ENCOUNTER — Telehealth: Payer: Self-pay | Admitting: Pulmonary Disease

## 2012-07-20 DIAGNOSIS — G4733 Obstructive sleep apnea (adult) (pediatric): Secondary | ICD-10-CM

## 2012-07-20 NOTE — Telephone Encounter (Signed)
She has mild obstructive sleep apnea  If willing, proceed with autoCPAP 5-12 with large nasal mask, download & FU in 4 wks

## 2012-07-20 NOTE — Procedures (Signed)
Katherine [name], Katherine [name]             ACCOUNT NO.:  [id number]  MEDICAL RECORD NO.:  [id number]          PATIENT TYPE:  OUT  LOCATION:  SLEEP CENTER                 FACILITY[facility]  PHYSICIAN:  Rigoberto Noel, MD      DATE OF BIRTH:  1955-06-02  DATE OF STUDY:  07/15/2012                           NOCTURNAL POLYSOMNOGRAM  REFERRING PHYSICIAN:  Rigoberto Noel, MD  INDICATION FOR THE STUDY:  Ms. Hashman is a 57 year old woman, smoker with chronic pain, loud snoring and choking episodes during sleep.  At the time of this study, she weighed 263 pounds with a height of 5 feet 6 inches, BMI of 42, neck size of 16 inches.  EPWORTH SLEEPINESS SCORE:  9.  MEDICATIONS:  Bystolic, Cymbalta, Rizatriptan, Voltaren, Zetia, gabapentin, OxyContin, and simvastatin.  This nocturnal polysomnogram was performed with the sleep technologist in attendance.  EEG, EOG, EMG, EKG, and respiratory parameters were recorded.  Sleep stages, arousals, limb movements, and respiratory data were scored according to criteria laid out by the American Academy of Sleep Medicine.  SLEEP ARCHITECTURE:  Lights out was at 10:04 p.m.  Lights on was at 5 a.m.  Total sleep time was 384 minutes.  There was sleep rhythm of 430 minutes and sleep efficiency of 92%.  Sleep latency was 3 minute. Latency to REM sleep was 338 minutes and wake after sleep onset was 28 minutes.  Sleep stages with the percentage of total sleep time was N1 4%, N2 89%, N3 0%, and REM sleep of 6% (25 minutes).  Supine sleep accounted for 42 minutes.  REM sleep was noted around 4 a.m.  RESPIRATORY DATA:  There were total of 3 obstructive apneas, 0 central apneas, 0 mixed apneas, and 82 hypopneas with an apnea-hypopnea index of 13 events per hour.  Longest apnea was 79 seconds and longest hypopnea was 37 seconds.  Most of these events were noted during supine REM sleep.  OXYGEN DATA:  The desaturation index was 15 events per hour.  The lowest desaturation  was 81%.  During REM sleep, she spent 30 minutes with saturation less than 88%.  CARDIAC DATA:  The low heart rate was 30 beats per minute.  The high heart rate recorded was an artifact.  No arrhythmias were noted.  MOVEMENT-PARASOMNIA:  No significant limb movements were noted.  AROUSAL DATA:  There were 35 arousals with an arousal index of 5 events per hour.  Of these, most of these were spontaneous.  DISCUSSION:  She was desensitized with a large nasal mask.  Most events were noted during supine or during REM.  She mostly slept in a left lateral position.  IMPRESSION: 1. Mild obstructive sleep apnea with predominant hypopneas during     supine and REM sleep causing sleep fragmentation and moderate     oxygen desaturation. 2. No evidence of cardiac arrhythmias, limb movements, or behavioral     disturbance during sleep.  RECOMMENDATIONS: 1. Treatment option at this degree of sleep disorder breathing include     weight loss, CPAP therapy, and/or oral appliances. 2. She should be cautioned against driving when sleepy.  She should be     asked to avoid medications with  sedative side effects.     Rigoberto Noel, MD    RVA/MEDQ  D:  07/19/2012 13:14:34  T:  07/20/2012 02:57:06  Job:  [id number]

## 2012-07-22 NOTE — Telephone Encounter (Signed)
lmomtcb x1 for pt 

## 2012-07-22 NOTE — Telephone Encounter (Signed)
I spoke with the pt and advised of the results. Pt is ok to try cpap. Order placed. [place] Bing, CMA

## 2012-07-27 ENCOUNTER — Encounter
Payer: BC Managed Care – PPO | Attending: Physical Medicine and Rehabilitation | Admitting: Physical Medicine and Rehabilitation

## 2012-07-27 ENCOUNTER — Encounter: Payer: Self-pay | Admitting: Physical Medicine and Rehabilitation

## 2012-07-27 VITALS — BP 155/82 | HR 75 | Resp 16 | Ht 66.0 in | Wt 276.0 lb

## 2012-07-27 DIAGNOSIS — R600 Localized edema: Secondary | ICD-10-CM

## 2012-07-27 DIAGNOSIS — T1490XA Injury, unspecified, initial encounter: Secondary | ICD-10-CM

## 2012-07-27 DIAGNOSIS — F17200 Nicotine dependence, unspecified, uncomplicated: Secondary | ICD-10-CM | POA: Insufficient documentation

## 2012-07-27 DIAGNOSIS — R609 Edema, unspecified: Secondary | ICD-10-CM

## 2012-07-27 DIAGNOSIS — S346XXS Injury of peripheral nerve(s) at abdomen, lower back and pelvis level, sequela: Secondary | ICD-10-CM | POA: Insufficient documentation

## 2012-07-27 DIAGNOSIS — S82402D Unspecified fracture of shaft of left fibula, subsequent encounter for closed fracture with routine healing: Secondary | ICD-10-CM

## 2012-07-27 DIAGNOSIS — S8412XS Injury of peroneal nerve at lower leg level, left leg, sequela: Secondary | ICD-10-CM

## 2012-07-27 DIAGNOSIS — G4730 Sleep apnea, unspecified: Secondary | ICD-10-CM | POA: Insufficient documentation

## 2012-07-27 DIAGNOSIS — G8921 Chronic pain due to trauma: Secondary | ICD-10-CM

## 2012-07-27 DIAGNOSIS — S8290XS Unspecified fracture of unspecified lower leg, sequela: Secondary | ICD-10-CM | POA: Insufficient documentation

## 2012-07-27 DIAGNOSIS — S069X9S Unspecified intracranial injury with loss of consciousness of unspecified duration, sequela: Secondary | ICD-10-CM | POA: Insufficient documentation

## 2012-07-27 DIAGNOSIS — Z87820 Personal history of traumatic brain injury: Secondary | ICD-10-CM

## 2012-07-27 DIAGNOSIS — S069XAS Unspecified intracranial injury with loss of consciousness status unknown, sequela: Secondary | ICD-10-CM | POA: Insufficient documentation

## 2012-07-27 DIAGNOSIS — S7012XD Contusion of left thigh, subsequent encounter: Secondary | ICD-10-CM

## 2012-07-27 DIAGNOSIS — M25559 Pain in unspecified hip: Secondary | ICD-10-CM | POA: Insufficient documentation

## 2012-07-27 DIAGNOSIS — I10 Essential (primary) hypertension: Secondary | ICD-10-CM | POA: Insufficient documentation

## 2012-07-27 DIAGNOSIS — S8290XD Unspecified fracture of unspecified lower leg, subsequent encounter for closed fracture with routine healing: Secondary | ICD-10-CM

## 2012-07-27 DIAGNOSIS — E119 Type 2 diabetes mellitus without complications: Secondary | ICD-10-CM | POA: Insufficient documentation

## 2012-07-27 DIAGNOSIS — S8412XD Injury of peroneal nerve at lower leg level, left leg, subsequent encounter: Secondary | ICD-10-CM

## 2012-07-27 DIAGNOSIS — S8490XS Injury of unspecified nerve at lower leg level, unspecified leg, sequela: Secondary | ICD-10-CM

## 2012-07-27 DIAGNOSIS — X58XXXS Exposure to other specified factors, sequela: Secondary | ICD-10-CM | POA: Insufficient documentation

## 2012-07-27 DIAGNOSIS — Z5189 Encounter for other specified aftercare: Secondary | ICD-10-CM

## 2012-07-27 DIAGNOSIS — E785 Hyperlipidemia, unspecified: Secondary | ICD-10-CM | POA: Insufficient documentation

## 2012-07-27 MED ORDER — OXYCODONE HCL ER 20 MG PO T12A: 20.0000 mg | EXTENDED_RELEASE_TABLET | Freq: Two times a day (BID) | ORAL | Status: DC

## 2012-07-27 NOTE — Progress Notes (Signed)
Subjective:    Patient ID: Katherine [name], female    DOB: 09-08-1955, 57 y.o.   MRN: [id number]  HPI  Katherine [name] is back regarding her TBI and polytrauma. She has done well with the change to her gabapentin and the scheduled oxycontin.She uses her Oxycodone very rarely, just 2 tablets in the last month. She reports that her leg muscles get tired after she walks for more than 15 min, and then her gait gets a little unstable. She is using the voltaren gel to her ankle. Her pain is minimal today.  Pain Inventory Average Pain 3 Pain Right Now 0 My pain is intermittent, dull and stabbing  In the last 24 hours, has pain interfered with the following? General activity 7 Relation with others 5 Enjoyment of life 5 What TIME of day is your pain at its worst? evening Sleep (in general) Fair  Pain is worse with: walking and some activites Pain improves with: pacing activities and medication Relief from Meds: 10  Mobility walk without assistance how many minutes can you walk? 10 ability to climb steps?  yes do you drive?  yes Do you have any goals in this area?  yes  Function disabled: date disabled 2013  Neuro/Psych weakness trouble walking confusion  Prior Studies Any changes since last visit?  no  Physicians involved in your care Any changes since last visit?  no   Family History  Problem Relation Age of Onset  . Heart disease Mother   . Colon polyps Mother   . Coronary artery disease Mother   . Aortic stenosis Mother   . Kidney failure Mother   . Lung cancer Father     lung  . Hypertension Sister   . Hypertension Brother   . Sarcoidosis Brother   . Other Brother     heart valve issues   History   Social History  . Marital Status: Widowed    Spouse Name: N/A    Number of Children: 2  . Years of Education: N/A   Occupational History  .  [employer]   Social History Main Topics  . Smoking status: Current Every Day Smoker -- 1.00 packs/day for 37  years    Types: Cigarettes  . Smokeless tobacco: Never Used  . Alcohol Use: No  . Drug Use: No  . Sexually Active: None   Other Topics Concern  . None   Social History Narrative  . None   Past Surgical History  Procedure Laterality Date  . Appendectomy    . Cholecystectomy    . Spine surgery    . Foot surgery    . Abdominal hysterectomy      total with BSO  . Colonoscopy  01/2007  . Tubal ligation     Past Medical History  Diagnosis Date  . Diabetes mellitus   . Hypertension   . Hyperlipidemia   . Anxiety   . Dermatitis   . Morbid obesity   . Proteinuria   . Migraine headache   . Overactive bladder   . Depression   . Sleep apnea   . H/O Clostridium difficile infection   . COPD (chronic obstructive pulmonary disease)   . Brain bleed     October 26, 2011  . Broken leg     Left leg. Aug 2013  . Ruptured spleen   . Kidney disorder    BP 155/82  Pulse 75  Resp 16  Ht 5' 6"  (1.676 m)  Wt 276 lb (125.193  kg)  BMI 44.57 kg/m2  SpO2 94%     Review of Systems  Musculoskeletal: Positive for gait problem.  Neurological: Positive for weakness.  Psychiatric/Behavioral: Positive for confusion.  All other systems reviewed and are negative.       Objective:   Physical Exam Constitutional: She is oriented to person, place, and time. She appears well-developed and well-nourished.  HENT:  Head: Normocephalic.  Neck: Neck supple.  Musculoskeletal: She exhibits tenderness.  Neurological: She is alert and oriented to person, place, and time.  Skin: Skin is warm and dry.  Psychiatric: She has a normal mood and affect.  Symmetric normal motor tone is noted throughout. Normal muscle bulk. Muscle testing reveals 5/5 muscle strength of the upper extremity, and 5/5 of the lower extremity, except tibialis anterior 3+/5. Full range of motion in upper and lower extremities, passive in left ankle dorsal ext.  Patient arises from chair with difficulty. Wide based gait with  an AFO on the left lower leg. She is not using a cane today. Able to do tandem walk, but slightly instable.         Assessment & Plan:   1. TBI with polytrauma with SAH  2. Displaced left distal fibula fracture, bilateral pubic symphysis fracture, rib fx's  3. Hx of left thigh hematoma  4. Left peroneal nerve injury which has shown great improvement.  5. Persistent left thigh pain- likely meralgia paresthetica  6. 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 Continue gabapentin to 317m bid and 600 qhs.  2. She remains out of work indefinitely although I think she would be appropriate for a sedentary.  3. . Refilled her oxy cr q12. She did not need the IR  4.continue voltaren gel to the left ankle/knee on a QID basis. As well as ice, appropriate posture and technique. .  5. Ordered aquatic PT for strengthening of her LE muscles, also to train her endurance and stability when walking, without putting to much strain on her joints.  Follow up with KSantiago Gladour PA in one month..Marland Kitchen

## 2012-07-27 NOTE — Patient Instructions (Signed)
Continue with your exercises and walking.

## 2012-08-27 ENCOUNTER — Encounter: Payer: Self-pay | Admitting: Physical Medicine and Rehabilitation

## 2012-08-27 ENCOUNTER — Encounter
Payer: BC Managed Care – PPO | Attending: Physical Medicine and Rehabilitation | Admitting: Physical Medicine and Rehabilitation

## 2012-08-27 VITALS — BP 162/100 | HR 100 | Resp 16 | Ht 66.0 in | Wt 264.0 lb

## 2012-08-27 DIAGNOSIS — Z5189 Encounter for other specified aftercare: Secondary | ICD-10-CM

## 2012-08-27 DIAGNOSIS — S069XAS Unspecified intracranial injury with loss of consciousness status unknown, sequela: Secondary | ICD-10-CM

## 2012-08-27 DIAGNOSIS — M25579 Pain in unspecified ankle and joints of unspecified foot: Secondary | ICD-10-CM | POA: Insufficient documentation

## 2012-08-27 DIAGNOSIS — S8490XS Injury of unspecified nerve at lower leg level, unspecified leg, sequela: Secondary | ICD-10-CM

## 2012-08-27 DIAGNOSIS — M79609 Pain in unspecified limb: Secondary | ICD-10-CM | POA: Insufficient documentation

## 2012-08-27 DIAGNOSIS — S82899A Other fracture of unspecified lower leg, initial encounter for closed fracture: Secondary | ICD-10-CM | POA: Insufficient documentation

## 2012-08-27 DIAGNOSIS — S2249XA Multiple fractures of ribs, unspecified side, initial encounter for closed fracture: Secondary | ICD-10-CM | POA: Insufficient documentation

## 2012-08-27 DIAGNOSIS — X58XXXA Exposure to other specified factors, initial encounter: Secondary | ICD-10-CM | POA: Insufficient documentation

## 2012-08-27 DIAGNOSIS — S065X9A Traumatic subdural hemorrhage with loss of consciousness of unspecified duration, initial encounter: Secondary | ICD-10-CM | POA: Insufficient documentation

## 2012-08-27 DIAGNOSIS — S8410XA Injury of peroneal nerve at lower leg level, unspecified leg, initial encounter: Secondary | ICD-10-CM | POA: Insufficient documentation

## 2012-08-27 DIAGNOSIS — S346XXS Injury of peripheral nerve(s) at abdomen, lower back and pelvis level, sequela: Secondary | ICD-10-CM

## 2012-08-27 DIAGNOSIS — S8290XD Unspecified fracture of unspecified lower leg, subsequent encounter for closed fracture with routine healing: Secondary | ICD-10-CM

## 2012-08-27 DIAGNOSIS — T1490XA Injury, unspecified, initial encounter: Secondary | ICD-10-CM

## 2012-08-27 DIAGNOSIS — S82402D Unspecified fracture of shaft of left fibula, subsequent encounter for closed fracture with routine healing: Secondary | ICD-10-CM

## 2012-08-27 DIAGNOSIS — R609 Edema, unspecified: Secondary | ICD-10-CM

## 2012-08-27 DIAGNOSIS — S7012XD Contusion of left thigh, subsequent encounter: Secondary | ICD-10-CM

## 2012-08-27 DIAGNOSIS — S069X9S Unspecified intracranial injury with loss of consciousness of unspecified duration, sequela: Secondary | ICD-10-CM

## 2012-08-27 DIAGNOSIS — S065XAA Traumatic subdural hemorrhage with loss of consciousness status unknown, initial encounter: Secondary | ICD-10-CM | POA: Insufficient documentation

## 2012-08-27 DIAGNOSIS — S32509A Unspecified fracture of unspecified pubis, initial encounter for closed fracture: Secondary | ICD-10-CM | POA: Insufficient documentation

## 2012-08-27 DIAGNOSIS — G8921 Chronic pain due to trauma: Secondary | ICD-10-CM

## 2012-08-27 DIAGNOSIS — S8410XS Injury of peroneal nerve at lower leg level, unspecified leg, sequela: Secondary | ICD-10-CM

## 2012-08-27 DIAGNOSIS — R600 Localized edema: Secondary | ICD-10-CM

## 2012-08-27 DIAGNOSIS — S8412XD Injury of peroneal nerve at lower leg level, left leg, subsequent encounter: Secondary | ICD-10-CM

## 2012-08-27 DIAGNOSIS — S069X0S Unspecified intracranial injury without loss of consciousness, sequela: Secondary | ICD-10-CM

## 2012-08-27 MED ORDER — OXYCODONE HCL ER 20 MG PO T12A: 20.0000 mg | EXTENDED_RELEASE_TABLET | Freq: Two times a day (BID) | ORAL | Status: DC

## 2012-08-27 NOTE — Patient Instructions (Addendum)
Try to stay as active as tolerated, continue with your exercise program. Continue with your aquatic PT.

## 2012-08-27 NOTE — Progress Notes (Signed)
Subjective:    Patient ID: Katherine [name], female    DOB: [birth year], 57 y.o.   MRN: [id number]  HPI Katherine [name] is back regarding her TBI and polytrauma. She has done well with the change to her gabapentin and the scheduled oxycontin.She uses her Oxycodone very rarely, just 2 tablets in the last month. She reports that her leg muscles get tired after she walks for more than 15 min, and then her gait gets a little unstable. She is using the voltaren gel to her ankle. Her pain is minimal today. She is doing aquatic therapy and this is helping her, and she really enjoys to be able to move with less pain.  Pain Inventory Average Pain 4 Pain Right Now 1 My pain is intermittent, burning, stabbing, tingling and aching  In the last 24 hours, has pain interfered with the following? General activity 2 Relation with others 2 Enjoyment of life 2 What TIME of day is your pain at its worst? varies Sleep (in general) Fair  Pain is worse with: some activites Pain improves with: pacing activities and medication Relief from Meds: 10  Mobility walk without assistance ability to climb steps?  yes do you drive?  yes transfers alone  Function not employed: date last employed 08/13  Neuro/Psych weakness numbness tremor tingling confusion  Prior Studies Any changes since last visit?  yes bone scan x-rays CT/MRI nerve study  Physicians involved in your care Any changes since last visit?  yes Water therapy   Family History  Problem Relation Age of Onset  . Heart disease Mother   . Colon polyps Mother   . Coronary artery disease Mother   . Aortic stenosis Mother   . Kidney failure Mother   . Lung cancer Father     lung  . Hypertension Sister   . Hypertension Brother   . Sarcoidosis Brother   . Other Brother     heart valve issues   History   Social History  . Marital Status: Widowed    Spouse Name: N/A    Number of Children: 2  . Years of Education: N/A    Occupational History  .  [employer]   Social History Main Topics  . Smoking status: Current Every Day Smoker -- 1.00 packs/day for 37 years    Types: Cigarettes  . Smokeless tobacco: Never Used  . Alcohol Use: No  . Drug Use: No  . Sexually Active: None   Other Topics Concern  . None   Social History Narrative  . None   Past Surgical History  Procedure Laterality Date  . Appendectomy    . Cholecystectomy    . Spine surgery    . Foot surgery    . Abdominal hysterectomy      total with BSO  . Colonoscopy  01/2007  . Tubal ligation     Past Medical History  Diagnosis Date  . Diabetes mellitus   . Hypertension   . Hyperlipidemia   . Anxiety   . Dermatitis   . Morbid obesity   . Proteinuria   . Migraine headache   . Overactive bladder   . Depression   . Sleep apnea   . H/O Clostridium difficile infection   . COPD (chronic obstructive pulmonary disease)   . Brain bleed     October 26, 2011  . Broken leg     Left leg. Aug 2013  . Ruptured spleen   . Kidney disorder    BP 162/100  Pulse 100  Resp 16  Ht 5' 6"  (1.676 m)  Wt 264 lb (119.75 kg)  BMI 42.63 kg/m2  SpO2 91%     Review of Systems  Constitutional: Positive for diaphoresis.  Respiratory: Positive for apnea and wheezing.   Gastrointestinal: Positive for nausea.  Genitourinary: Positive for difficulty urinating.  Neurological: Positive for tremors, weakness and numbness.  Psychiatric/Behavioral: Positive for confusion.  All other systems reviewed and are negative.       Objective:   Physical Exam Constitutional: She is oriented to person, place, and time. She appears well-developed and well-nourished.  HENT:  Head: Normocephalic.  Neck: Neck supple.  Musculoskeletal: She exhibits tenderness.  Neurological: She is alert and oriented to person, place, and time.  Skin: Skin is warm and dry.  Psychiatric: She has a normal mood and affect.  Symmetric normal motor tone is noted  throughout. Normal muscle bulk. Muscle testing reveals 5/5 muscle strength of the upper extremity, and 5/5 of the lower extremity, except tibialis anterior 3+/5. Full range of motion in upper and lower extremities, passive in left ankle dorsal ext.  Patient arises from chair with difficulty. Wide based gait with an AFO on the left lower leg. She is not using a cane today. Able to do tandem walk, but slightly instable.        Assessment & Plan:  1. TBI with polytrauma with SAH  2. Displaced left distal fibula fracture, bilateral pubic symphysis fracture, rib fx's  3. Hx of left thigh hematoma  4. Left peroneal nerve injury which has shown great improvement.  5. Persistent left thigh pain- likely meralgia paresthetica  6. 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 Continue gabapentin to 352m bid and 600 qhs.  2. She remains out of work indefinitely although I think she would be appropriate for a sedentary.  3. . Refilled her oxy cr q12. She did not need the IR  4.continue voltaren gel to the left ankle/knee on a QID basis. As well as ice, appropriate posture and technique. .  5. Ordered aquatic PT for strengthening of her LE muscles, also to train her endurance and stability when walking, without putting to much strain on her joints. She really enjoys the therapy in the water, it has helped with her functioning and pain, she should continue for 4 more weeks at least, and then transition to aquatic exercises in a gym if possible. Follow up with PA in one month..Marland Kitchen

## 2012-09-02 ENCOUNTER — Other Ambulatory Visit: Payer: Self-pay

## 2012-09-02 DIAGNOSIS — S7012XD Contusion of left thigh, subsequent encounter: Secondary | ICD-10-CM

## 2012-09-02 DIAGNOSIS — T1490XA Injury, unspecified, initial encounter: Secondary | ICD-10-CM

## 2012-09-02 DIAGNOSIS — R600 Localized edema: Secondary | ICD-10-CM

## 2012-09-02 DIAGNOSIS — S8412XD Injury of peroneal nerve at lower leg level, left leg, subsequent encounter: Secondary | ICD-10-CM

## 2012-09-02 MED ORDER — GABAPENTIN 300 MG PO CAPS: 300.0000 mg | ORAL_CAPSULE | Freq: Four times a day (QID) | ORAL | Status: DC

## 2012-09-02 MED ORDER — DICLOFENAC SODIUM 1 % TD GEL: 2.0000 g | Freq: Four times a day (QID) | TRANSDERMAL | Status: DC

## 2012-09-06 ENCOUNTER — Other Ambulatory Visit: Payer: Self-pay

## 2012-09-06 DIAGNOSIS — R600 Localized edema: Secondary | ICD-10-CM

## 2012-09-06 DIAGNOSIS — S8412XD Injury of peroneal nerve at lower leg level, left leg, subsequent encounter: Secondary | ICD-10-CM

## 2012-09-06 DIAGNOSIS — T1490XA Injury, unspecified, initial encounter: Secondary | ICD-10-CM

## 2012-09-06 DIAGNOSIS — S7012XD Contusion of left thigh, subsequent encounter: Secondary | ICD-10-CM

## 2012-09-06 MED ORDER — DICLOFENAC SODIUM 1 % TD GEL: 2.0000 g | Freq: Four times a day (QID) | TRANSDERMAL | Status: DC

## 2012-09-06 MED ORDER — GABAPENTIN 300 MG PO CAPS: 300.0000 mg | ORAL_CAPSULE | Freq: Four times a day (QID) | ORAL | Status: DC

## 2012-09-07 ENCOUNTER — Encounter (HOSPITAL_COMMUNITY): Payer: Self-pay

## 2012-09-07 ENCOUNTER — Observation Stay (HOSPITAL_COMMUNITY)
Admission: EM | Admit: 2012-09-07 | Discharge: 2012-09-09 | DRG: 294 | Disposition: A | Discharge status: Home / Self Care | Payer: BC Managed Care – PPO | Attending: Internal Medicine | Admitting: Internal Medicine

## 2012-09-07 ENCOUNTER — Emergency Department (HOSPITAL_COMMUNITY): Payer: BC Managed Care – PPO

## 2012-09-07 DIAGNOSIS — Z720 Tobacco use: Secondary | ICD-10-CM | POA: Diagnosis present

## 2012-09-07 DIAGNOSIS — I10 Essential (primary) hypertension: Secondary | ICD-10-CM | POA: Diagnosis present

## 2012-09-07 DIAGNOSIS — E785 Hyperlipidemia, unspecified: Secondary | ICD-10-CM | POA: Diagnosis present

## 2012-09-07 DIAGNOSIS — Z794 Long term (current) use of insulin: Secondary | ICD-10-CM | POA: Insufficient documentation

## 2012-09-07 DIAGNOSIS — R600 Localized edema: Secondary | ICD-10-CM

## 2012-09-07 DIAGNOSIS — J449 Chronic obstructive pulmonary disease, unspecified: Secondary | ICD-10-CM | POA: Insufficient documentation

## 2012-09-07 DIAGNOSIS — J984 Other disorders of lung: Secondary | ICD-10-CM

## 2012-09-07 DIAGNOSIS — N318 Other neuromuscular dysfunction of bladder: Secondary | ICD-10-CM

## 2012-09-07 DIAGNOSIS — R635 Abnormal weight gain: Secondary | ICD-10-CM

## 2012-09-07 DIAGNOSIS — Z79899 Other long term (current) drug therapy: Secondary | ICD-10-CM | POA: Insufficient documentation

## 2012-09-07 DIAGNOSIS — F17200 Nicotine dependence, unspecified, uncomplicated: Secondary | ICD-10-CM | POA: Insufficient documentation

## 2012-09-07 DIAGNOSIS — J4489 Other specified chronic obstructive pulmonary disease: Secondary | ICD-10-CM | POA: Insufficient documentation

## 2012-09-07 DIAGNOSIS — R739 Hyperglycemia, unspecified: Secondary | ICD-10-CM

## 2012-09-07 DIAGNOSIS — E1129 Type 2 diabetes mellitus with other diabetic kidney complication: Secondary | ICD-10-CM | POA: Diagnosis present

## 2012-09-07 DIAGNOSIS — E1100 Type 2 diabetes mellitus with hyperosmolarity without nonketotic hyperglycemic-hyperosmolar coma (NKHHC): Secondary | ICD-10-CM

## 2012-09-07 DIAGNOSIS — G4733 Obstructive sleep apnea (adult) (pediatric): Secondary | ICD-10-CM | POA: Diagnosis present

## 2012-09-07 DIAGNOSIS — R079 Chest pain, unspecified: Secondary | ICD-10-CM | POA: Insufficient documentation

## 2012-09-07 DIAGNOSIS — T1490XA Injury, unspecified, initial encounter: Secondary | ICD-10-CM

## 2012-09-07 DIAGNOSIS — I272 Pulmonary hypertension, unspecified: Secondary | ICD-10-CM

## 2012-09-07 DIAGNOSIS — G894 Chronic pain syndrome: Secondary | ICD-10-CM

## 2012-09-07 DIAGNOSIS — Z87820 Personal history of traumatic brain injury: Secondary | ICD-10-CM | POA: Insufficient documentation

## 2012-09-07 DIAGNOSIS — D62 Acute posthemorrhagic anemia: Secondary | ICD-10-CM

## 2012-09-07 DIAGNOSIS — E119 Type 2 diabetes mellitus without complications: Secondary | ICD-10-CM

## 2012-09-07 DIAGNOSIS — IMO0001 Reserved for inherently not codable concepts without codable children: Principal | ICD-10-CM | POA: Insufficient documentation

## 2012-09-07 DIAGNOSIS — L219 Seborrheic dermatitis, unspecified: Secondary | ICD-10-CM

## 2012-09-07 DIAGNOSIS — F411 Generalized anxiety disorder: Secondary | ICD-10-CM

## 2012-09-07 DIAGNOSIS — R930 Abnormal findings on diagnostic imaging of skull and head, not elsewhere classified: Secondary | ICD-10-CM

## 2012-09-07 DIAGNOSIS — G43909 Migraine, unspecified, not intractable, without status migrainosus: Secondary | ICD-10-CM

## 2012-09-07 LAB — BASIC METABOLIC PANEL WITH GFR
BUN: 9 mg/dL (ref 6–23)
CO2: 25 meq/L (ref 19–32)
Calcium: 9.8 mg/dL (ref 8.4–10.5)
Creatinine, Ser: 0.72 mg/dL (ref 0.50–1.10)
Glucose, Bld: 462 mg/dL — ABNORMAL HIGH (ref 70–99)

## 2012-09-07 LAB — CBC
HCT: 45.3 % (ref 36.0–46.0)
Hemoglobin: 16.1 g/dL — ABNORMAL HIGH (ref 12.0–15.0)
MCH: 28.5 pg (ref 26.0–34.0)
MCHC: 35.5 g/dL (ref 30.0–36.0)
MCV: 80.3 fL (ref 78.0–100.0)
Platelets: 142 10*3/uL — ABNORMAL LOW (ref 150–400)
RBC: 5.64 MIL/uL — ABNORMAL HIGH (ref 3.87–5.11)
RDW: 12.5 % (ref 11.5–15.5)
WBC: 7.9 10*3/uL (ref 4.0–10.5)

## 2012-09-07 LAB — BASIC METABOLIC PANEL
Chloride: 96 mEq/L (ref 96–112)
GFR calc Af Amer: >90 mL/min (ref >90)
GFR calc non Af Amer: >90 mL/min (ref >90)
Potassium: 3.8 mEq/L (ref 3.5–5.1)
Sodium: 133 mEq/L — ABNORMAL LOW (ref 135–145)

## 2012-09-07 LAB — POCT I-STAT TROPONIN I
Troponin i, poc: 0 ng/mL (ref 0.00–0.08)
Troponin i, poc: 0 ng/mL (ref 0.00–0.08)

## 2012-09-07 LAB — GLUCOSE, CAPILLARY
Glucose-Capillary: 493 mg/dL — ABNORMAL HIGH (ref 70–99)
Glucose-Capillary: 446 mg/dL — ABNORMAL HIGH (ref 70–99)
Glucose-Capillary: 371 mg/dL — ABNORMAL HIGH (ref 70–99)
Glucose-Capillary: 400 mg/dL — ABNORMAL HIGH (ref 70–99)

## 2012-09-07 LAB — URINALYSIS, ROUTINE W REFLEX MICROSCOPIC
Ketones, ur: NEGATIVE mg/dL
Leukocytes, UA: NEGATIVE
Protein, ur: 30 mg/dL — AB
Urobilinogen, UA: 0.2 mg/dL (ref 0.0–1.0)

## 2012-09-07 LAB — LIPID PANEL
HDL: 28 mg/dL — ABNORMAL LOW (ref >39)
LDL Cholesterol: 20 mg/dL (ref 0–99)

## 2012-09-07 LAB — URINE MICROSCOPIC-ADD ON

## 2012-09-07 MED ORDER — INSULIN ASPART 100 UNIT/ML ~~LOC~~ SOLN: 0.0000 [IU] | SUBCUTANEOUS | Status: DC

## 2012-09-07 MED ORDER — INSULIN GLARGINE 100 UNIT/ML ~~LOC~~ SOLN
6.0000 [IU] | Freq: Every day | SUBCUTANEOUS | Status: DC
  Filled 2012-09-07: qty 0.1

## 2012-09-07 MED ORDER — GABAPENTIN 300 MG PO CAPS
300.0000 mg | ORAL_CAPSULE | Freq: Four times a day (QID) | ORAL | Status: DC
  Administered 2012-09-07 – 2012-09-09 (×6): 300 mg via ORAL
  Filled 2012-09-07 (×9): qty 1

## 2012-09-07 MED ORDER — ADULT MULTIVITAMIN W/MINERALS CH
1.0000 | ORAL_TABLET | Freq: Every day | ORAL | Status: DC
  Administered 2012-09-08 – 2012-09-09 (×2): 1 via ORAL
  Filled 2012-09-07 (×3): qty 1

## 2012-09-07 MED ORDER — SODIUM CHLORIDE 0.9 % IV BOLUS (SEPSIS)
1000.0000 mL | Freq: Once | INTRAVENOUS | Status: AC
  Administered 2012-09-07: 1000 mL via INTRAVENOUS

## 2012-09-07 MED ORDER — LISINOPRIL 10 MG PO TABS
10.0000 mg | ORAL_TABLET | Freq: Every day | ORAL | Status: DC
  Administered 2012-09-08: 10 mg via ORAL
  Filled 2012-09-07 (×2): qty 1

## 2012-09-07 MED ORDER — SODIUM CHLORIDE 0.9 % IV SOLN
Freq: Once | INTRAVENOUS | Status: AC
  Administered 2012-09-08: via INTRAVENOUS

## 2012-09-07 MED ORDER — OXYCODONE HCL ER 20 MG PO T12A
20.0000 mg | EXTENDED_RELEASE_TABLET | Freq: Two times a day (BID) | ORAL | Status: DC
  Administered 2012-09-07 – 2012-09-09 (×4): 20 mg via ORAL
  Filled 2012-09-07: qty 2
  Filled 2012-09-07 (×4): qty 1

## 2012-09-07 MED ORDER — MIRTAZAPINE 30 MG PO TABS
30.0000 mg | ORAL_TABLET | Freq: Every day | ORAL | Status: DC
  Administered 2012-09-07 – 2012-09-08 (×2): 30 mg via ORAL
  Filled 2012-09-07 (×3): qty 1

## 2012-09-07 MED ORDER — SIMVASTATIN 20 MG PO TABS
20.0000 mg | ORAL_TABLET | Freq: Every day | ORAL | Status: DC
  Administered 2012-09-07 – 2012-09-08 (×2): 20 mg via ORAL
  Filled 2012-09-07 (×3): qty 1

## 2012-09-07 MED ORDER — DICLOFENAC SODIUM 1 % TD GEL
2.0000 g | Freq: Four times a day (QID) | TRANSDERMAL | Status: DC
Start: 2012-09-07 — End: 2012-09-09
  Administered 2012-09-07 – 2012-09-09 (×6): 2 g via TOPICAL
  Filled 2012-09-07: qty 100

## 2012-09-07 MED ORDER — VITAMIN D3 25 MCG (1000 UNIT) PO TABS
1000.0000 [IU] | ORAL_TABLET | Freq: Every day | ORAL | Status: DC
  Administered 2012-09-08 – 2012-09-09 (×2): 1000 [IU] via ORAL
  Filled 2012-09-07 (×3): qty 1

## 2012-09-07 MED ORDER — INSULIN REGULAR BOLUS VIA INFUSION
0.0000 [IU] | Freq: Three times a day (TID) | INTRAVENOUS | Status: DC
  Filled 2012-09-07: qty 10

## 2012-09-07 MED ORDER — DEXTROSE-NACL 5-0.45 % IV SOLN
INTRAVENOUS | Status: DC
  Administered 2012-09-08: 01:00:00 via INTRAVENOUS

## 2012-09-07 MED ORDER — PROMETHAZINE HCL 12.5 MG PO TABS
12.5000 mg | ORAL_TABLET | Freq: Four times a day (QID) | ORAL | Status: DC | PRN
  Filled 2012-09-07: qty 1

## 2012-09-07 MED ORDER — OMEGA-3-ACID ETHYL ESTERS 1 G PO CAPS
1.0000 g | ORAL_CAPSULE | Freq: Every day | ORAL | Status: DC
  Administered 2012-09-08 – 2012-09-09 (×2): 1 g via ORAL
  Filled 2012-09-07 (×3): qty 1

## 2012-09-07 MED ORDER — PANTOPRAZOLE SODIUM 40 MG PO TBEC
40.0000 mg | DELAYED_RELEASE_TABLET | Freq: Every day | ORAL | Status: DC
  Administered 2012-09-07 – 2012-09-09 (×3): 40 mg via ORAL
  Filled 2012-09-07 (×3): qty 1

## 2012-09-07 MED ORDER — SODIUM CHLORIDE 0.9 % IV SOLN
INTRAVENOUS | Status: DC
  Administered 2012-09-07: 3.1 [IU]/h via INTRAVENOUS
  Filled 2012-09-07: qty 1

## 2012-09-07 MED ORDER — EZETIMIBE 10 MG PO TABS
10.0000 mg | ORAL_TABLET | Freq: Every day | ORAL | Status: DC
  Administered 2012-09-08 – 2012-09-09 (×2): 10 mg via ORAL
  Filled 2012-09-07 (×3): qty 1

## 2012-09-07 MED ORDER — CLONAZEPAM 0.5 MG PO TABS
0.5000 mg | ORAL_TABLET | Freq: Two times a day (BID) | ORAL | Status: DC
Start: 2012-09-07 — End: 2012-09-09
  Administered 2012-09-07 – 2012-09-09 (×4): 0.5 mg via ORAL
  Filled 2012-09-07 (×4): qty 1

## 2012-09-07 MED ORDER — DEXTROSE 50 % IV SOLN: 25.0000 mL | INTRAVENOUS | Status: DC | PRN

## 2012-09-07 MED ORDER — ALBUTEROL SULFATE HFA 108 (90 BASE) MCG/ACT IN AERS
2.0000 | INHALATION_SPRAY | Freq: Four times a day (QID) | RESPIRATORY_TRACT | Status: DC | PRN
  Filled 2012-09-07: qty 6.7

## 2012-09-07 MED ORDER — SODIUM CHLORIDE 0.9 % IV SOLN
INTRAVENOUS | Status: DC
  Administered 2012-09-08: 09:00:00 via INTRAVENOUS

## 2012-09-07 MED ORDER — INSULIN ASPART 100 UNIT/ML ~~LOC~~ SOLN
5.0000 [IU] | Freq: Once | SUBCUTANEOUS | Status: AC
  Administered 2012-09-07: 5 [IU] via SUBCUTANEOUS
  Filled 2012-09-07: qty 1

## 2012-09-07 MED ORDER — OXYCODONE HCL ER 10 MG PO T12A: 10.0000 mg | EXTENDED_RELEASE_TABLET | Freq: Four times a day (QID) | ORAL | Status: DC | PRN

## 2012-09-07 MED ORDER — SODIUM CHLORIDE 0.9 % IV SOLN
INTRAVENOUS | Status: DC
  Administered 2012-09-07 (×2): via INTRAVENOUS

## 2012-09-07 MED ORDER — NEBIVOLOL HCL 10 MG PO TABS
10.0000 mg | ORAL_TABLET | Freq: Every day | ORAL | Status: DC
  Administered 2012-09-08 – 2012-09-09 (×2): 10 mg via ORAL
  Filled 2012-09-07 (×3): qty 1

## 2012-09-07 MED ORDER — DULOXETINE HCL 60 MG PO CPEP
60.0000 mg | ORAL_CAPSULE | Freq: Every day | ORAL | Status: DC
  Administered 2012-09-08 – 2012-09-09 (×2): 60 mg via ORAL
  Filled 2012-09-07 (×3): qty 1

## 2012-09-07 NOTE — ED Provider Notes (Signed)
Medical screening examination/treatment/procedure(s) were conducted as a shared visit with non-physician practitioner(s) and myself.  I personally evaluated the patient during the encounter  Pt sent from physician office due to uncontrollable DM, HTN and CP's that have been worsening for the past few weeks.  No cough, URI symptoms. No prior h/o CAD.  Pt has been increasing lantus by 3 units every other day, started at 17, now to 26 over the past 2 weeks, and blood sugar was in the high 400's in the office today, so sent to the ED.    ECG shows no ischemia.  Troponin is normal times 2, no current CP.  Pt reports worse with exertion.  Needs close cardiology follow up.  Pt had a stress test in the past negative, but was >10 years ago.  Will give [place] insulin, IVF's and admit for monitoring, diabetes management that is failing outpt therapy.    Katherine [name]. Nickayla Mcinnis, MD 09/07/12 1745

## 2012-09-07 NOTE — ED Notes (Signed)
CBG 400

## 2012-09-07 NOTE — ED Provider Notes (Signed)
History    CSN: [id number] Arrival date & time 09/07/12  1408  First MD Initiated Contact with Patient 09/07/12 1450     Chief Complaint  Patient presents with  . Hypertension  . Chest Pain   (Consider location/radiation/quality/duration/timing/severity/associated sxs/prior Treatment) HPI Comments: Patient is a 57 year old female with history of DM and HTN who presents today sent from her PCP's office. At the office her blood sugar was too high to read on their machine. Her diabetes was well controlled up until a month ago when her sugars began running in the 500s. For the past 2 weeks she has been increasing her Lantus by 3 units every 3rd day. She is now up to 26 units of insulin daily. She also reports that for the past 3 days she has been feeling generally poorly. She has been weak, nauseous, having dysuria, polydipsia, polyuria.  She reports a home BP of 209/184. She feels "tight" in her chest with exertion. It lasts a few minutes. Her last stress test was in 2002 and the doctor told her she was just out of shape. Her son also reports that she has been more confused recently. She is alert and oriented x 4 for the interview.  Patient is a 57 y.o. female presenting with hypertension and chest pain. The history is provided by the patient. No language interpreter was used.  Hypertension Associated symptoms include chest pain, fatigue, nausea and weakness. Pertinent negatives include no abdominal pain or vomiting.  Chest Pain Associated symptoms: fatigue, nausea and weakness   Associated symptoms: no abdominal pain, no shortness of breath and not vomiting    Past Medical History  Diagnosis Date  . Diabetes mellitus   . Hypertension   . Hyperlipidemia   . Anxiety   . Dermatitis   . Morbid obesity   . Proteinuria   . Migraine headache   . Overactive bladder   . Depression   . Sleep apnea   . H/O Clostridium difficile infection   . COPD (chronic obstructive pulmonary disease)   .  Brain bleed     October 26, 2011  . Broken leg     Left leg. Aug 2013  . Ruptured spleen   . Kidney disorder    Past Surgical History  Procedure Laterality Date  . Appendectomy    . Cholecystectomy    . Spine surgery    . Foot surgery    . Abdominal hysterectomy      total with BSO  . Colonoscopy  01/2007  . Tubal ligation     Family History  Problem Relation Age of Onset  . Heart disease Mother   . Colon polyps Mother   . Coronary artery disease Mother   . Aortic stenosis Mother   . Kidney failure Mother   . Lung cancer Father     lung  . Hypertension Sister   . Hypertension Brother   . Sarcoidosis Brother   . Other Brother     heart valve issues   History  Substance Use Topics  . Smoking status: Current Every Day Smoker -- 1.00 packs/day for 37 years    Types: Cigarettes  . Smokeless tobacco: Never Used  . Alcohol Use: No   OB History   Grav Para Term Preterm Abortions TAB SAB Ect Mult Living                 Review of Systems  Constitutional: Positive for fatigue.  Respiratory: Negative for shortness of  breath.   Cardiovascular: Positive for chest pain.  Gastrointestinal: Positive for nausea. Negative for vomiting and abdominal pain.  Endocrine: Positive for polydipsia and polyuria.  Neurological: Positive for weakness.  All other systems reviewed and are negative.    Allergies  Review of patient's allergies indicates no known allergies.  Home Medications   Current Outpatient Rx  Name  Route  Sig  Dispense  Refill  . BD ULTRA-FINE PEN NEEDLES 29G X 12.7MM MISC               . cholecalciferol (VITAMIN D) 1000 UNITS tablet   Oral   Take 1,000 Units by mouth daily.         . ciprofloxacin (CIPRO) 500 MG tablet               . clonazePAM (KLONOPIN) 0.5 MG tablet   Oral   Take 0.5 mg by mouth 2 (two) times daily.          . diclofenac sodium (VOLTAREN) 1 % GEL   Topical   Apply 2 g topically 4 (four) times daily.   6 Tube   1    . DULoxetine (CYMBALTA) 60 MG capsule   Oral   Take 60 mg by mouth daily.         Marland Kitchen ezetimibe (ZETIA) 10 MG tablet   Oral   Take 10 mg by mouth daily.         . fluconazole (DIFLUCAN) 150 MG tablet               . FREESTYLE LITE test strip               . gabapentin (NEURONTIN) 300 MG capsule   Oral   Take 1 capsule (300 mg total) by mouth 4 (four) times daily. One pill at breakfast in afternoon, and two pills at bedtime   270 capsule   1   . insulin glargine (LANTUS) 100 UNIT/ML injection   Subcutaneous   Inject 6-10 Units into the skin at bedtime. Per sliding scale         . lisinopril (PRINIVIL,ZESTRIL) 10 MG tablet   Oral   Take 10 mg by mouth daily.         . mirtazapine (REMERON) 30 MG tablet   Oral   Take 30 mg by mouth at bedtime.         . nebivolol (BYSTOLIC) 10 MG tablet   Oral   Take 10 mg by mouth daily.         . nitroGLYCERIN (NITROSTAT) 0.4 MG SL tablet   Sublingual   Place 0.4 mg under the tongue every 5 (five) minutes x 2 doses as needed. For chest pain         . OxyCODONE (OXYCONTIN) 20 mg T12A   Oral   Take 1 tablet (20 mg total) by mouth every 12 (twelve) hours.   60 tablet   0   . pantoprazole (PROTONIX) 40 MG tablet   Oral   Take 40 mg by mouth daily.         . rizatriptan (MAXALT-MLT) 10 MG disintegrating tablet      as needed.          . simvastatin (ZOCOR) 40 MG tablet   Oral   Take 20 mg by mouth at bedtime.           BP 137/79  Pulse 83  Temp(Src) 97.7 F (36.5 C) (Oral)  Resp 18  Ht 5' 6"  (1.676 m)  Wt 260 lb (117.935 kg)  BMI 41.99 kg/m2  SpO2 92% Physical Exam  Nursing note and vitals reviewed. Constitutional: She is oriented to person, place, and time. She appears well-developed and well-nourished. She is cooperative. She appears ill. No distress.  HENT:  Head: Normocephalic and atraumatic.  Right Ear: External ear normal.  Left Ear: External ear normal.  Nose: Nose normal.   Mouth/Throat: Oropharynx is clear and moist.  Eyes: Conjunctivae are normal.  Neck: Normal range of motion.  Cardiovascular: Normal rate, regular rhythm and normal heart sounds.   Pulmonary/Chest: Effort normal and breath sounds normal. No stridor. No respiratory distress. She has no wheezes. She has no rales.  Abdominal: Soft. She exhibits no distension.  Musculoskeletal: Normal range of motion.  Neurological: She is alert and oriented to person, place, and time. She has normal strength.  Skin: Skin is warm and dry. She is not diaphoretic. No erythema.  Psychiatric: She has a normal mood and affect. Her behavior is normal.    ED Course  Procedures (including critical care time) Labs Reviewed  CBC - Abnormal; Notable for the following:    RBC 5.64 (*)    Hemoglobin 16.1 (*)    Platelets 142 (*)    All other components within normal limits  BASIC METABOLIC PANEL - Abnormal; Notable for the following:    Sodium 133 (*)    Glucose, Bld 462 (*)    All other components within normal limits  URINALYSIS, ROUTINE W REFLEX MICROSCOPIC - Abnormal; Notable for the following:    Glucose, UA >1000 (*)    Protein, ur 30 (*)    All other components within normal limits  GLUCOSE, CAPILLARY - Abnormal; Notable for the following:    Glucose-Capillary 493 (*)    All other components within normal limits  URINE MICROSCOPIC-ADD ON - Abnormal; Notable for the following:    Squamous Epithelial / LPF FEW (*)    All other components within normal limits  GLUCOSE, CAPILLARY - Abnormal; Notable for the following:    Glucose-Capillary 446 (*)    All other components within normal limits  GLUCOSE, CAPILLARY - Abnormal; Notable for the following:    Glucose-Capillary 371 (*)    All other components within normal limits  GLUCOSE, CAPILLARY - Abnormal; Notable for the following:    Glucose-Capillary 400 (*)    All other components within normal limits  HEMOGLOBIN A1C - Abnormal; Notable for the  following:    Hemoglobin A1C 10.1 (*)    Mean Plasma Glucose 243 (*)    All other components within normal limits  LIPID PANEL - Abnormal; Notable for the following:    Triglycerides 267 (*)    HDL 28 (*)    VLDL 53 (*)    All other components within normal limits  GLUCOSE, CAPILLARY - Abnormal; Notable for the following:    Glucose-Capillary 344 (*)    All other components within normal limits  GLUCOSE, CAPILLARY - Abnormal; Notable for the following:    Glucose-Capillary 307 (*)    All other components within normal limits  GLUCOSE, CAPILLARY - Abnormal; Notable for the following:    Glucose-Capillary 249 (*)    All other components within normal limits  GLUCOSE, CAPILLARY - Abnormal; Notable for the following:    Glucose-Capillary 249 (*)    All other components within normal limits  GLUCOSE, CAPILLARY - Abnormal; Notable for the following:    Glucose-Capillary 142 (*)  All other components within normal limits  BASIC METABOLIC PANEL - Abnormal; Notable for the following:    Glucose, Bld 145 (*)    All other components within normal limits  GLUCOSE, CAPILLARY - Abnormal; Notable for the following:    Glucose-Capillary 125 (*)    All other components within normal limits  GLUCOSE, CAPILLARY - Abnormal; Notable for the following:    Glucose-Capillary 137 (*)    All other components within normal limits  GLUCOSE, CAPILLARY - Abnormal; Notable for the following:    Glucose-Capillary 146 (*)    All other components within normal limits  GLUCOSE, CAPILLARY - Abnormal; Notable for the following:    Glucose-Capillary 168 (*)    All other components within normal limits  GLUCOSE, CAPILLARY - Abnormal; Notable for the following:    Glucose-Capillary 345 (*)    All other components within normal limits  MRSA PCR SCREENING  CULTURE, BLOOD (ROUTINE X 2)  URINE CULTURE  CULTURE, BLOOD (SINGLE)  TSH  T4  HEMOGLOBIN A1C  POCT I-STAT TROPONIN I  POCT I-STAT TROPONIN I      Date: 09/07/2012  Rate: 81  Rhythm: normal sinus rhythm  QRS Axis: normal  Intervals: normal  ST/T Wave abnormalities: normal  Conduction Disutrbances:none  Narrative Interpretation:   Old EKG Reviewed: unchanged    Dg Chest 2 View  09/07/2012   *RADIOLOGY REPORT*  Clinical Data: Shortness of breath  CHEST - 2 VIEW  Comparison: 01/27/2012  Findings: Heart size is normal.  No pleural effusion or edema identified.  There is no airspace consolidation identified. Coarsened interstitial markings are noted bilaterally.  IMPRESSION:  1.  No acute cardiopulmonary abnormalities.   Original Report Authenticated By: Kerby Moors, M.D.   1. Hyperglycemia   2. Chronic pain syndrome   3. OSA (obstructive sleep apnea)   4. Other and unspecified hyperlipidemia   5. Tobacco abuse   6. Unspecified essential hypertension     MDM  Patient presents with hyperglycemia sent in by pcp. Failing outpatient diabetes management. Exertional CP. Troponin negative x 2. Patient placed on glucose stabilizer. Called [facility] to admit who agrees with plan. Vital signs stable for transfer. Dr. Dorna Mai evaluated patient and agrees with plan.    Elwyn Lade, PA-C 09/08/12 1534
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
## 2012-09-07 NOTE — ED Notes (Signed)
Pt c/o intermittent chest tightness, high blood pressure, nausea, and diaphoresis x2 weeks. Pt seen at her PCP today and sent here for further evaluation.

## 2012-09-07 NOTE — Progress Notes (Signed)
Unit CM UR Completed by [facility] ED CM  W. Raequan Vanschaick RN  

## 2012-09-08 DIAGNOSIS — I517 Cardiomegaly: Secondary | ICD-10-CM

## 2012-09-08 DIAGNOSIS — R7309 Other abnormal glucose: Secondary | ICD-10-CM

## 2012-09-08 LAB — HEMOGLOBIN A1C
Hgb A1c MFr Bld: 10.1 % — ABNORMAL HIGH (ref <5.7)
Hgb A1c MFr Bld: 10.1 % — ABNORMAL HIGH (ref <5.7)
Mean Plasma Glucose: 243 mg/dL — ABNORMAL HIGH (ref <117)
Mean Plasma Glucose: 243 mg/dL — ABNORMAL HIGH (ref <117)

## 2012-09-08 LAB — GLUCOSE, CAPILLARY
Glucose-Capillary: 249 mg/dL — ABNORMAL HIGH (ref 70–99)
Glucose-Capillary: 307 mg/dL — ABNORMAL HIGH (ref 70–99)
Glucose-Capillary: 344 mg/dL — ABNORMAL HIGH (ref 70–99)
Glucose-Capillary: 168 mg/dL — ABNORMAL HIGH (ref 70–99)
Glucose-Capillary: 345 mg/dL — ABNORMAL HIGH (ref 70–99)
Glucose-Capillary: 337 mg/dL — ABNORMAL HIGH (ref 70–99)
Glucose-Capillary: 156 mg/dL — ABNORMAL HIGH (ref 70–99)
Glucose-Capillary: 247 mg/dL — ABNORMAL HIGH (ref 70–99)

## 2012-09-08 LAB — BASIC METABOLIC PANEL
BUN: 11 mg/dL (ref 6–23)
CO2: 27 mEq/L (ref 19–32)
Chloride: 106 mEq/L (ref 96–112)
Creatinine, Ser: 0.58 mg/dL (ref 0.50–1.10)

## 2012-09-08 MED ORDER — LISINOPRIL 20 MG PO TABS
20.0000 mg | ORAL_TABLET | Freq: Every day | ORAL | Status: DC
  Administered 2012-09-09: 20 mg via ORAL
  Filled 2012-09-08: qty 1

## 2012-09-08 MED ORDER — INSULIN REGULAR HUMAN 100 UNIT/ML IJ SOLN: INTRAMUSCULAR | Status: DC

## 2012-09-08 MED ORDER — INSULIN ASPART 100 UNIT/ML ~~LOC~~ SOLN
0.0000 [IU] | Freq: Once | SUBCUTANEOUS | Status: AC
  Administered 2012-09-08: 3 [IU] via SUBCUTANEOUS

## 2012-09-08 MED ORDER — ASPIRIN EC 81 MG PO TBEC
81.0000 mg | DELAYED_RELEASE_TABLET | Freq: Every day | ORAL | Status: DC
  Administered 2012-09-08 – 2012-09-09 (×2): 81 mg via ORAL
  Filled 2012-09-08 (×2): qty 1

## 2012-09-08 MED ORDER — ENOXAPARIN SODIUM 40 MG/0.4ML ~~LOC~~ SOLN
40.0000 mg | SUBCUTANEOUS | Status: DC
  Administered 2012-09-08: 40 mg via SUBCUTANEOUS
  Filled 2012-09-08 (×2): qty 0.4

## 2012-09-08 MED ORDER — INSULIN GLARGINE 100 UNIT/ML ~~LOC~~ SOLN
25.0000 [IU] | Freq: Every day | SUBCUTANEOUS | Status: DC
  Administered 2012-09-08: 25 [IU] via SUBCUTANEOUS
  Filled 2012-09-08: qty 0.25

## 2012-09-08 MED ORDER — ONDANSETRON HCL 4 MG/2ML IJ SOLN: 4.0000 mg | Freq: Four times a day (QID) | INTRAMUSCULAR | Status: DC | PRN

## 2012-09-08 MED ORDER — INSULIN ASPART 100 UNIT/ML ~~LOC~~ SOLN
0.0000 [IU] | Freq: Three times a day (TID) | SUBCUTANEOUS | Status: DC
  Administered 2012-09-08 (×2): 11 [IU] via SUBCUTANEOUS
  Administered 2012-09-09: 8 [IU] via SUBCUTANEOUS
  Administered 2012-09-09: 11 [IU] via SUBCUTANEOUS

## 2012-09-08 MED ORDER — INSULIN GLARGINE 100 UNIT/ML ~~LOC~~ SOLN
28.0000 [IU] | Freq: Every day | SUBCUTANEOUS | Status: DC
  Filled 2012-09-08: qty 0.28

## 2012-09-08 MED ORDER — INSULIN ASPART 100 UNIT/ML ~~LOC~~ SOLN
0.0000 [IU] | Freq: Every day | SUBCUTANEOUS | Status: DC
  Administered 2012-09-08: 2 [IU] via SUBCUTANEOUS

## 2012-09-08 MED ORDER — SODIUM CHLORIDE 0.9 % IV SOLN
INTRAVENOUS | Status: DC
  Administered 2012-09-08: 10 mL/h via INTRAVENOUS
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
## 2012-09-08 NOTE — Progress Notes (Signed)
Nutrition Brief Note  Malnutrition Screening Tool result is inaccurate.  Please consult if nutrition needs are identified.  Arthur Holms, RD, LDN Pager #: [phone] After-Hours Pager #: [phone]

## 2012-09-08 NOTE — Progress Notes (Signed)
Inpatient Diabetes Program Recommendations  AACE/ADA: New Consensus Statement on Inpatient Glycemic Control (2013)  Target Ranges:  Prepandial:   less than 140 mg/dL      Peak postprandial:   less than 180 mg/dL (1-2 hours)      Critically ill patients:  140 - 180 mg/dL   Reason for Visit: Diabetes Consult - Uncontrolled DM  57 y.o. female presenting with hypertension and chest pain. AG - 12 on admission. Pt states she was on Lantus only at home, had previously been on metformin as well as Novolog and said her blood sugars were controlled.  After last hospitalization, was discharged on Lantus and blood sugars have been running high.  Monitors 2-3 times/day.  Uses insulin pen and states "sometimes insulin is on skin after injection." Reviewed insulin pen instructions and pt verbalized understatnding.  Very interested in OP Diabetes Education and Probation officer will order same.  Results for Katherine, [name] (MRN [id number]) as of 09/08/2012 16:50  Ref. Range 09/08/2012 04:40  Sodium Latest Range: 135-145 mEq/L 138  Potassium Latest Range: 3.5-5.1 mEq/L 3.5  Chloride Latest Range: 96-112 mEq/L 106  CO2 Latest Range: 19-32 mEq/L 27  BUN Latest Range: 6-23 mg/dL 11  Creatinine Latest Range: 0.50-1.10 mg/dL 0.58  Calcium Latest Range: 8.4-10.5 mg/dL 8.8  GFR calc non Af Amer Latest Range: >90 mL/min >90  GFR calc Af Amer Latest Range: >90 mL/min >90  Glucose Latest Range: 70-99 mg/dL 145 (H)  Results for Katherine, [name] (MRN [id number]) as of 09/08/2012 16:50  Ref. Range 09/08/2012 05:26 09/08/2012 06:33 09/08/2012 12:47 09/08/2012 16:36  Glucose-Capillary Latest Range: 70-99 mg/dL 146 (H) 168 (H) 345 (H) 337 (H)   Inpatient Diabetes Program Recommendations Insulin - Basal: Lantus increased to 28 units QAM Insulin - Meal Coverage: Add meal coverage insulin - Novolog 4 units tidwc if pt eats >50% meal HgbA1C: 10.1% - uncontrolled Outpatient Referral: Will refer to [facility] for OP Diabetes Education  Note: Blood  sugars increased since GlucoStabilizer discontinued.  Will need meal coverage insulin and recommend pt to go home on Lantus, along with Novolog correction and meal coverage. Discussed importance of glycemic control and results of HgbA1C. Will f/u in am.  Thank you. Lorenda Peck, RD, LDN, CDE Inpatient Diabetes Coordinator [phone]

## 2012-09-08 NOTE — Progress Notes (Signed)
Echocardiogram 2D Echocardiogram has been performed.  Donata Clay 09/08/2012, 9:53 AM

## 2012-09-09 DIAGNOSIS — E1100 Type 2 diabetes mellitus with hyperosmolarity without nonketotic hyperglycemic-hyperosmolar coma (NKHHC): Secondary | ICD-10-CM

## 2012-09-09 DIAGNOSIS — E785 Hyperlipidemia, unspecified: Secondary | ICD-10-CM

## 2012-09-09 DIAGNOSIS — E1101 Type 2 diabetes mellitus with hyperosmolarity with coma: Secondary | ICD-10-CM

## 2012-09-09 DIAGNOSIS — G894 Chronic pain syndrome: Secondary | ICD-10-CM

## 2012-09-09 DIAGNOSIS — G4733 Obstructive sleep apnea (adult) (pediatric): Secondary | ICD-10-CM

## 2012-09-09 DIAGNOSIS — F17200 Nicotine dependence, unspecified, uncomplicated: Secondary | ICD-10-CM

## 2012-09-09 LAB — GLUCOSE, CAPILLARY

## 2012-09-09 LAB — BASIC METABOLIC PANEL
GFR calc Af Amer: >90 mL/min (ref >90)
GFR calc non Af Amer: >90 mL/min (ref >90)
Potassium: 4.1 mEq/L (ref 3.5–5.1)
Sodium: 138 mEq/L (ref 135–145)

## 2012-09-09 LAB — URINE CULTURE: Colony Count: >=100000

## 2012-09-09 LAB — CBC
Hemoglobin: 13.8 g/dL (ref 12.0–15.0)
Platelets: 101 10*3/uL — ABNORMAL LOW (ref 150–400)
RBC: 5.15 MIL/uL — ABNORMAL HIGH (ref 3.87–5.11)

## 2012-09-09 LAB — TROPONIN I: Troponin I: <0.3 ng/mL (ref <0.30)

## 2012-09-09 MED ORDER — INSULIN GLARGINE 100 UNIT/ML ~~LOC~~ SOLN
35.0000 [IU] | Freq: Every day | SUBCUTANEOUS | Status: DC
  Administered 2012-09-09: 35 [IU] via SUBCUTANEOUS
  Filled 2012-09-09 (×2): qty 0.35

## 2012-09-09 MED ORDER — INSULIN GLARGINE 100 UNIT/ML ~~LOC~~ SOLN: 40.0000 [IU] | Freq: Every day | SUBCUTANEOUS | Status: DC

## 2012-09-09 MED ORDER — INSULIN SYRINGES (DISPOSABLE) U-100 1 ML MISC: Status: DC
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
## 2012-09-09 NOTE — Progress Notes (Signed)
Reviewed discharge instructions with patients, answered questions, verbalized understanding Katherine [name]

## 2012-09-09 NOTE — Progress Notes (Signed)
PT Cancellation/Screen Note  Patient Details Name: Katherine [name] MRN: [id number] DOB: 09-Jan-1956   Cancelled Treatment:    Reason Eval/Treat Not Completed: PT screened, no needs identified, will sign off; spoke with patient who reports up and walking here in hospital and feels she is at her baseline for mobility.  Noted OT eval and d/c without and needs.  Will sign off with no eval due to pt without PT needs.  Thank you.   Willie Plain,CYNDI 09/09/2012, 9:45 AM

## 2012-09-14 LAB — CULTURE, BLOOD (SINGLE): Culture: NO GROWTH

## 2012-09-14 LAB — CULTURE, BLOOD (ROUTINE X 2)

## 2012-09-23 ENCOUNTER — Encounter: Payer: BC Managed Care – PPO | Admitting: Physical Medicine and Rehabilitation

## 2012-10-06 ENCOUNTER — Encounter
Payer: BC Managed Care – PPO | Attending: Physical Medicine and Rehabilitation | Admitting: Physical Medicine and Rehabilitation

## 2012-10-06 ENCOUNTER — Encounter: Payer: Self-pay | Admitting: Physical Medicine and Rehabilitation

## 2012-10-06 VITALS — BP 147/88 | HR 78 | Resp 16 | Ht 66.0 in | Wt 264.0 lb

## 2012-10-06 DIAGNOSIS — E119 Type 2 diabetes mellitus without complications: Secondary | ICD-10-CM | POA: Insufficient documentation

## 2012-10-06 DIAGNOSIS — S8410XS Injury of peroneal nerve at lower leg level, unspecified leg, sequela: Secondary | ICD-10-CM

## 2012-10-06 DIAGNOSIS — J449 Chronic obstructive pulmonary disease, unspecified: Secondary | ICD-10-CM | POA: Insufficient documentation

## 2012-10-06 DIAGNOSIS — Z5189 Encounter for other specified aftercare: Secondary | ICD-10-CM

## 2012-10-06 DIAGNOSIS — G4730 Sleep apnea, unspecified: Secondary | ICD-10-CM | POA: Insufficient documentation

## 2012-10-06 DIAGNOSIS — S7012XD Contusion of left thigh, subsequent encounter: Secondary | ICD-10-CM

## 2012-10-06 DIAGNOSIS — E785 Hyperlipidemia, unspecified: Secondary | ICD-10-CM | POA: Insufficient documentation

## 2012-10-06 DIAGNOSIS — S069XAS Unspecified intracranial injury with loss of consciousness status unknown, sequela: Secondary | ICD-10-CM | POA: Insufficient documentation

## 2012-10-06 DIAGNOSIS — T1490XA Injury, unspecified, initial encounter: Secondary | ICD-10-CM

## 2012-10-06 DIAGNOSIS — M79609 Pain in unspecified limb: Secondary | ICD-10-CM | POA: Insufficient documentation

## 2012-10-06 DIAGNOSIS — R609 Edema, unspecified: Secondary | ICD-10-CM

## 2012-10-06 DIAGNOSIS — IMO0002 Reserved for concepts with insufficient information to code with codable children: Secondary | ICD-10-CM | POA: Insufficient documentation

## 2012-10-06 DIAGNOSIS — S346XXS Injury of peripheral nerve(s) at abdomen, lower back and pelvis level, sequela: Secondary | ICD-10-CM

## 2012-10-06 DIAGNOSIS — G8921 Chronic pain due to trauma: Secondary | ICD-10-CM

## 2012-10-06 DIAGNOSIS — S069X9S Unspecified intracranial injury with loss of consciousness of unspecified duration, sequela: Secondary | ICD-10-CM | POA: Insufficient documentation

## 2012-10-06 DIAGNOSIS — S8412XD Injury of peroneal nerve at lower leg level, left leg, subsequent encounter: Secondary | ICD-10-CM

## 2012-10-06 DIAGNOSIS — S8290XS Unspecified fracture of unspecified lower leg, sequela: Secondary | ICD-10-CM | POA: Insufficient documentation

## 2012-10-06 DIAGNOSIS — S82402D Unspecified fracture of shaft of left fibula, subsequent encounter for closed fracture with routine healing: Secondary | ICD-10-CM

## 2012-10-06 DIAGNOSIS — X58XXXS Exposure to other specified factors, sequela: Secondary | ICD-10-CM | POA: Insufficient documentation

## 2012-10-06 DIAGNOSIS — R600 Localized edema: Secondary | ICD-10-CM

## 2012-10-06 DIAGNOSIS — J4489 Other specified chronic obstructive pulmonary disease: Secondary | ICD-10-CM | POA: Insufficient documentation

## 2012-10-06 DIAGNOSIS — S8490XS Injury of unspecified nerve at lower leg level, unspecified leg, sequela: Secondary | ICD-10-CM | POA: Insufficient documentation

## 2012-10-06 DIAGNOSIS — S8290XD Unspecified fracture of unspecified lower leg, subsequent encounter for closed fracture with routine healing: Secondary | ICD-10-CM

## 2012-10-06 DIAGNOSIS — I10 Essential (primary) hypertension: Secondary | ICD-10-CM | POA: Insufficient documentation

## 2012-10-06 MED ORDER — OXYCODONE HCL ER 20 MG PO T12A: 20.0000 mg | EXTENDED_RELEASE_TABLET | Freq: Two times a day (BID) | ORAL | Status: DC

## 2012-10-06 NOTE — Patient Instructions (Addendum)
Stay as active as tolerated. 

## 2012-10-06 NOTE — Progress Notes (Signed)
Subjective:    Patient ID: Katherine [name], female    DOB: 15-Oct-1955, 57 y.o.   MRN: [id number]  HPI Katherine [name] is back regarding her TBI and polytrauma. She has done well with the change to her gabapentin and the scheduled oxycontin.She uses her Oxycodone very rarely, just 2 tablets in the last month. She reports that her leg muscles get tired after she walks for more than 15 min, and then her gait gets a little unstable. She is using the voltaren gel to her ankle. Her pain is minimal today.  She is had to stop the aquatic therapy, because of HTN being uncontrolled. When her BP is controlled again, she wants to continue, because the aquatic therapy is helping her, and she really enjoys to be able to move with less pain. She was admitted to the hospital for high BS, in the beginning of July, she stayed for about 4 days. She reports that she now is on another HTN med, which seems to lower her BP. She reports that she has a follow up appointment with her PCP on 08/26, and that she also has an appointment with a nutritionist on the same day. Pain Inventory Average Pain 1 Pain Right Now 1 My pain is intermittent and aching  In the last 24 hours, has pain interfered with the following? General activity 2 Relation with others 2 Enjoyment of life 2 What TIME of day is your pain at its worst? evening Sleep (in general) Fair  Pain is worse with: walking, bending, sitting, standing and some activites Pain improves with: medication Relief from Meds: 10  Mobility walk without assistance how many minutes can you walk? 5-10 ability to climb steps?  yes do you drive?  yes Do you have any goals in this area?  yes  Function disabled: date disabled 89  Neuro/Psych weakness numbness tremor tingling trouble walking confusion  Prior Studies Any changes since last visit?  yes change in vision due to diabetes  Physicians involved in your care Any changes since last visit?  no   Family  History  Problem Relation Age of Onset  . Heart disease Mother   . Colon polyps Mother   . Coronary artery disease Mother   . Aortic stenosis Mother   . Kidney failure Mother   . Lung cancer Father     lung  . Hypertension Sister   . Hypertension Brother   . Sarcoidosis Brother   . Other Brother     heart valve issues   History   Social History  . Marital Status: Widowed    Spouse Name: N/A    Number of Children: 2  . Years of Education: N/A   Occupational History  .  [employer]   Social History Main Topics  . Smoking status: Current Every Day Smoker -- 1.00 packs/day for 37 years    Types: Cigarettes  . Smokeless tobacco: Never Used  . Alcohol Use: No  . Drug Use: No  . Sexually Active: None   Other Topics Concern  . None   Social History Narrative  . None   Past Surgical History  Procedure Laterality Date  . Appendectomy    . Cholecystectomy    . Spine surgery    . Foot surgery    . Abdominal hysterectomy      total with BSO  . Colonoscopy  01/2007  . Tubal ligation     Past Medical History  Diagnosis Date  . Diabetes mellitus   .  Hypertension   . Hyperlipidemia   . Anxiety   . Dermatitis   . Morbid obesity   . Proteinuria   . Migraine headache   . Overactive bladder   . Depression   . Sleep apnea   . H/O Clostridium difficile infection   . COPD (chronic obstructive pulmonary disease)   . Brain bleed     October 26, 2011  . Broken leg     Left leg. Aug 2013  . Ruptured spleen   . Kidney disorder    BP 147/88  Pulse 78  Resp 16  Ht 5' 6"  (1.676 m)  Wt 264 lb (119.75 kg)  BMI 42.63 kg/m2  SpO2 92%     Review of Systems  Constitutional: Positive for diaphoresis.  Respiratory: Positive for wheezing.   Musculoskeletal: Positive for gait problem.  Neurological: Positive for tremors, weakness and numbness.  Psychiatric/Behavioral: Positive for confusion.  All other systems reviewed and are negative.       Objective:    Physical Exam Constitutional: She is oriented to person, place, and time. She appears well-developed and well-nourished.  HENT:  Head: Normocephalic.  Neck: Neck supple.  Musculoskeletal: She exhibits tenderness.  Neurological: She is alert and oriented to person, place, and time.  Skin: Skin is warm and dry.  Psychiatric: She has a normal mood and affect.  Symmetric normal motor tone is noted throughout. Normal muscle bulk. Muscle testing reveals 5/5 muscle strength of the upper extremity, and 5/5 of the lower extremity, except tibialis anterior 3+/5. Full range of motion in upper and lower extremities, passive in left ankle dorsal ext.  Patient arises from chair with difficulty. Wide based gait with an AFO on the left lower leg. She is not using a cane today. Able to do tandem walk, but slightly instable.        Assessment & Plan:  1. TBI with polytrauma with SAH  2. Displaced left distal fibula fracture, bilateral pubic symphysis fracture, rib fx's  3. Hx of left thigh hematoma  4. Left peroneal nerve injury which has shown great improvement.  5. Persistent left thigh pain- likely meralgia paresthetica  6. 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 She reports that she now is on another HTN med, which seems to lower her BP. She reports that she has a follow up appointment with her PCP on 08/26, and that she also has an appointment with a nutritionist on the same day. Plan:  1. Continue gabapentin to 365m bid and 600 qhs.  2. She remains out of work indefinitely although I think she would be appropriate for a sedentary.  3. . Refilled her oxy cr q12. She did not need the IR  4.continue voltaren gel to the left ankle/knee on a QID basis. As well as ice, appropriate posture and technique. .  5. Aquatic PT for strengthening of her LE muscles, also to train her endurance and stability  when walking, without putting to much strain on her joints. She really enjoys the therapy in the water, it has helped with her functioning and pain, she should continue for 4 more weeks at least, and then transition to aquatic exercises in a gym if possible. She should continue when her BP is under better control. 6. Advised patient to try to quit smoking, advised her to talk to her PCP at her next visit, at the  end of this month about this. Follow up with PA in one month.Marland Kitchen

## 2012-10-26 ENCOUNTER — Encounter: Payer: Self-pay | Admitting: *Deleted

## 2012-10-26 ENCOUNTER — Encounter: Payer: BC Managed Care – PPO | Attending: Family Medicine | Admitting: *Deleted

## 2012-10-26 VITALS — Ht 66.0 in | Wt 275.7 lb

## 2012-10-26 DIAGNOSIS — Z713 Dietary counseling and surveillance: Secondary | ICD-10-CM | POA: Insufficient documentation

## 2012-10-26 DIAGNOSIS — I10 Essential (primary) hypertension: Secondary | ICD-10-CM | POA: Insufficient documentation

## 2012-10-26 DIAGNOSIS — E119 Type 2 diabetes mellitus without complications: Secondary | ICD-10-CM | POA: Insufficient documentation
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
## 2012-10-26 NOTE — Patient Instructions (Signed)
Goals:  Follow Diabetes Meal Plan as instructed  Eat 3 meals and 2 snacks, every 3-5 hrs  Limit carbohydrate intake to 30-45 grams carbohydrate/meal  Limit carbohydrate intake to 15 grams carbohydrate/snack  Add lean protein foods to meals/snacks  Monitor glucose levels as instructed by your doctor  Aim for 10 mins of physical activity daily  Bring food record and glucose log to your next nutrition visit

## 2012-11-04 ENCOUNTER — Encounter
Payer: BC Managed Care – PPO | Attending: Physical Medicine and Rehabilitation | Admitting: Physical Medicine and Rehabilitation

## 2012-11-04 ENCOUNTER — Encounter: Payer: Self-pay | Admitting: Physical Medicine and Rehabilitation

## 2012-11-04 VITALS — BP 134/71 | HR 73 | Resp 14 | Ht 65.0 in | Wt 270.6 lb

## 2012-11-04 DIAGNOSIS — Z79899 Other long term (current) drug therapy: Secondary | ICD-10-CM

## 2012-11-04 DIAGNOSIS — IMO0002 Reserved for concepts with insufficient information to code with codable children: Secondary | ICD-10-CM | POA: Insufficient documentation

## 2012-11-04 DIAGNOSIS — S82402D Unspecified fracture of shaft of left fibula, subsequent encounter for closed fracture with routine healing: Secondary | ICD-10-CM

## 2012-11-04 DIAGNOSIS — S7012XD Contusion of left thigh, subsequent encounter: Secondary | ICD-10-CM

## 2012-11-04 DIAGNOSIS — X58XXXS Exposure to other specified factors, sequela: Secondary | ICD-10-CM | POA: Insufficient documentation

## 2012-11-04 DIAGNOSIS — S8412XD Injury of peroneal nerve at lower leg level, left leg, subsequent encounter: Secondary | ICD-10-CM

## 2012-11-04 DIAGNOSIS — S346XXS Injury of peripheral nerve(s) at abdomen, lower back and pelvis level, sequela: Secondary | ICD-10-CM

## 2012-11-04 DIAGNOSIS — T1490XA Injury, unspecified, initial encounter: Secondary | ICD-10-CM

## 2012-11-04 DIAGNOSIS — S069XAS Unspecified intracranial injury with loss of consciousness status unknown, sequela: Secondary | ICD-10-CM | POA: Insufficient documentation

## 2012-11-04 DIAGNOSIS — I10 Essential (primary) hypertension: Secondary | ICD-10-CM | POA: Insufficient documentation

## 2012-11-04 DIAGNOSIS — M79609 Pain in unspecified limb: Secondary | ICD-10-CM | POA: Insufficient documentation

## 2012-11-04 DIAGNOSIS — R600 Localized edema: Secondary | ICD-10-CM

## 2012-11-04 DIAGNOSIS — R609 Edema, unspecified: Secondary | ICD-10-CM

## 2012-11-04 DIAGNOSIS — S8290XS Unspecified fracture of unspecified lower leg, sequela: Secondary | ICD-10-CM | POA: Insufficient documentation

## 2012-11-04 DIAGNOSIS — F17200 Nicotine dependence, unspecified, uncomplicated: Secondary | ICD-10-CM | POA: Insufficient documentation

## 2012-11-04 DIAGNOSIS — S069X9S Unspecified intracranial injury with loss of consciousness of unspecified duration, sequela: Secondary | ICD-10-CM | POA: Insufficient documentation

## 2012-11-04 DIAGNOSIS — Z5189 Encounter for other specified aftercare: Secondary | ICD-10-CM

## 2012-11-04 DIAGNOSIS — Z5181 Encounter for therapeutic drug level monitoring: Secondary | ICD-10-CM

## 2012-11-04 DIAGNOSIS — G8921 Chronic pain due to trauma: Secondary | ICD-10-CM

## 2012-11-04 DIAGNOSIS — S8290XD Unspecified fracture of unspecified lower leg, subsequent encounter for closed fracture with routine healing: Secondary | ICD-10-CM

## 2012-11-04 DIAGNOSIS — S8490XS Injury of unspecified nerve at lower leg level, unspecified leg, sequela: Secondary | ICD-10-CM

## 2012-11-04 MED ORDER — OXYCODONE HCL 10 MG PO TABS: 10.0000 mg | ORAL_TABLET | Freq: Four times a day (QID) | ORAL | Status: DC | PRN

## 2012-11-04 MED ORDER — OXYCODONE HCL ER 20 MG PO T12A: 20.0000 mg | EXTENDED_RELEASE_TABLET | Freq: Two times a day (BID) | ORAL | Status: DC

## 2012-11-04 NOTE — Patient Instructions (Signed)
Start with aquatic therapy again
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
## 2012-11-15 ENCOUNTER — Telehealth: Payer: Self-pay

## 2012-11-15 NOTE — Telephone Encounter (Signed)
Darlene from [facility] called and wanted to speak with you personally about the patient. She wants to know more information about the patient's current function and restrictions. She would like you to give her a call on a recorded line at [phone].

## 2012-11-16 ENCOUNTER — Telehealth: Payer: Self-pay

## 2012-11-16 NOTE — Telephone Encounter (Signed)
Will talk to Dr. Naaman Plummer first, in case he filled out any disability forms about her functioning.

## 2012-11-16 NOTE — Telephone Encounter (Signed)
Spoke with Carlyon Shadow and informed her that patient could do vocational rehab for sedentary work.

## 2012-11-16 NOTE — Telephone Encounter (Signed)
That would be fine with me. She may need a functional capacity evaluation to specifically quantify her work tolerance level

## 2012-11-16 NOTE — Telephone Encounter (Signed)
Katherine [name] with [employer] spoke with patient and they are wanting to get her involved with vocational rehab for a sedentary job.  They would like approval.

## 2012-11-16 NOTE — Telephone Encounter (Signed)
i think Dr. Naaman Plummer was already notified, I also think a FCE would be the best way to determine her capacities.

## 2012-11-25 ENCOUNTER — Ambulatory Visit: Payer: BC Managed Care – PPO | Admitting: *Deleted

## 2012-12-01 ENCOUNTER — Encounter
Payer: BC Managed Care – PPO | Attending: Physical Medicine and Rehabilitation | Admitting: Physical Medicine and Rehabilitation

## 2012-12-01 ENCOUNTER — Encounter: Payer: Self-pay | Admitting: Physical Medicine and Rehabilitation

## 2012-12-01 VITALS — BP 162/81 | HR 89 | Resp 14 | Ht 66.0 in | Wt 263.6 lb

## 2012-12-01 DIAGNOSIS — R600 Localized edema: Secondary | ICD-10-CM

## 2012-12-01 DIAGNOSIS — S8290XS Unspecified fracture of unspecified lower leg, sequela: Secondary | ICD-10-CM | POA: Insufficient documentation

## 2012-12-01 DIAGNOSIS — IMO0002 Reserved for concepts with insufficient information to code with codable children: Secondary | ICD-10-CM | POA: Insufficient documentation

## 2012-12-01 DIAGNOSIS — S069X9S Unspecified intracranial injury with loss of consciousness of unspecified duration, sequela: Secondary | ICD-10-CM | POA: Insufficient documentation

## 2012-12-01 DIAGNOSIS — R609 Edema, unspecified: Secondary | ICD-10-CM

## 2012-12-01 DIAGNOSIS — S82402D Unspecified fracture of shaft of left fibula, subsequent encounter for closed fracture with routine healing: Secondary | ICD-10-CM

## 2012-12-01 DIAGNOSIS — M79609 Pain in unspecified limb: Secondary | ICD-10-CM | POA: Insufficient documentation

## 2012-12-01 DIAGNOSIS — F17200 Nicotine dependence, unspecified, uncomplicated: Secondary | ICD-10-CM | POA: Insufficient documentation

## 2012-12-01 DIAGNOSIS — Z5189 Encounter for other specified aftercare: Secondary | ICD-10-CM

## 2012-12-01 DIAGNOSIS — X58XXXS Exposure to other specified factors, sequela: Secondary | ICD-10-CM | POA: Insufficient documentation

## 2012-12-01 DIAGNOSIS — G4730 Sleep apnea, unspecified: Secondary | ICD-10-CM | POA: Insufficient documentation

## 2012-12-01 DIAGNOSIS — S8412XD Injury of peroneal nerve at lower leg level, left leg, subsequent encounter: Secondary | ICD-10-CM

## 2012-12-01 DIAGNOSIS — J449 Chronic obstructive pulmonary disease, unspecified: Secondary | ICD-10-CM | POA: Insufficient documentation

## 2012-12-01 DIAGNOSIS — S069XAS Unspecified intracranial injury with loss of consciousness status unknown, sequela: Secondary | ICD-10-CM | POA: Insufficient documentation

## 2012-12-01 DIAGNOSIS — S8490XS Injury of unspecified nerve at lower leg level, unspecified leg, sequela: Secondary | ICD-10-CM | POA: Insufficient documentation

## 2012-12-01 DIAGNOSIS — S346XXS Injury of peripheral nerve(s) at abdomen, lower back and pelvis level, sequela: Secondary | ICD-10-CM | POA: Insufficient documentation

## 2012-12-01 DIAGNOSIS — T1490XA Injury, unspecified, initial encounter: Secondary | ICD-10-CM

## 2012-12-01 DIAGNOSIS — S8290XD Unspecified fracture of unspecified lower leg, subsequent encounter for closed fracture with routine healing: Secondary | ICD-10-CM

## 2012-12-01 DIAGNOSIS — E119 Type 2 diabetes mellitus without complications: Secondary | ICD-10-CM | POA: Insufficient documentation

## 2012-12-01 DIAGNOSIS — E785 Hyperlipidemia, unspecified: Secondary | ICD-10-CM | POA: Insufficient documentation

## 2012-12-01 DIAGNOSIS — I10 Essential (primary) hypertension: Secondary | ICD-10-CM | POA: Insufficient documentation

## 2012-12-01 DIAGNOSIS — S7012XD Contusion of left thigh, subsequent encounter: Secondary | ICD-10-CM

## 2012-12-01 DIAGNOSIS — J4489 Other specified chronic obstructive pulmonary disease: Secondary | ICD-10-CM | POA: Insufficient documentation

## 2012-12-01 MED ORDER — OXYCODONE HCL ER 20 MG PO T12A: 20.0000 mg | EXTENDED_RELEASE_TABLET | Freq: Two times a day (BID) | ORAL | Status: DC

## 2012-12-01 NOTE — Patient Instructions (Signed)
Please ask your insurance, whether they support the silver sneaker's program , where they would pay for a [employer]. Follow up with your PCP for your memory issues
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
## 2012-12-24 ENCOUNTER — Other Ambulatory Visit: Payer: Self-pay | Admitting: Family Medicine

## 2012-12-24 DIAGNOSIS — R413 Other amnesia: Secondary | ICD-10-CM

## 2012-12-27 ENCOUNTER — Ambulatory Visit
Admission: RE | Admit: 2012-12-27 | Discharge: 2012-12-27 | Disposition: A | Discharge status: Home / Self Care | Payer: BC Managed Care – PPO | Source: Ambulatory Visit | Attending: Family Medicine | Admitting: Family Medicine

## 2012-12-27 DIAGNOSIS — R413 Other amnesia: Secondary | ICD-10-CM

## 2012-12-30 ENCOUNTER — Encounter
Payer: BC Managed Care – PPO | Attending: Physical Medicine and Rehabilitation | Admitting: Physical Medicine and Rehabilitation

## 2012-12-30 ENCOUNTER — Encounter: Payer: Self-pay | Admitting: Physical Medicine and Rehabilitation

## 2012-12-30 VITALS — BP 164/77 | HR 88 | Resp 16 | Ht 66.0 in | Wt 280.4 lb

## 2012-12-30 DIAGNOSIS — M79609 Pain in unspecified limb: Secondary | ICD-10-CM | POA: Insufficient documentation

## 2012-12-30 DIAGNOSIS — T1490XA Injury, unspecified, initial encounter: Secondary | ICD-10-CM

## 2012-12-30 DIAGNOSIS — R600 Localized edema: Secondary | ICD-10-CM

## 2012-12-30 DIAGNOSIS — Z5189 Encounter for other specified aftercare: Secondary | ICD-10-CM

## 2012-12-30 DIAGNOSIS — J4489 Other specified chronic obstructive pulmonary disease: Secondary | ICD-10-CM | POA: Insufficient documentation

## 2012-12-30 DIAGNOSIS — S7012XD Contusion of left thigh, subsequent encounter: Secondary | ICD-10-CM

## 2012-12-30 DIAGNOSIS — G894 Chronic pain syndrome: Secondary | ICD-10-CM

## 2012-12-30 DIAGNOSIS — E119 Type 2 diabetes mellitus without complications: Secondary | ICD-10-CM | POA: Insufficient documentation

## 2012-12-30 DIAGNOSIS — I10 Essential (primary) hypertension: Secondary | ICD-10-CM | POA: Insufficient documentation

## 2012-12-30 DIAGNOSIS — R609 Edema, unspecified: Secondary | ICD-10-CM

## 2012-12-30 DIAGNOSIS — IMO0002 Reserved for concepts with insufficient information to code with codable children: Secondary | ICD-10-CM | POA: Insufficient documentation

## 2012-12-30 DIAGNOSIS — E785 Hyperlipidemia, unspecified: Secondary | ICD-10-CM | POA: Insufficient documentation

## 2012-12-30 DIAGNOSIS — S8412XS Injury of peroneal nerve at lower leg level, left leg, sequela: Secondary | ICD-10-CM

## 2012-12-30 DIAGNOSIS — S8412XD Injury of peroneal nerve at lower leg level, left leg, subsequent encounter: Secondary | ICD-10-CM

## 2012-12-30 DIAGNOSIS — S8490XS Injury of unspecified nerve at lower leg level, unspecified leg, sequela: Secondary | ICD-10-CM

## 2012-12-30 DIAGNOSIS — X58XXXS Exposure to other specified factors, sequela: Secondary | ICD-10-CM | POA: Insufficient documentation

## 2012-12-30 DIAGNOSIS — S82402D Unspecified fracture of shaft of left fibula, subsequent encounter for closed fracture with routine healing: Secondary | ICD-10-CM

## 2012-12-30 DIAGNOSIS — S8290XD Unspecified fracture of unspecified lower leg, subsequent encounter for closed fracture with routine healing: Secondary | ICD-10-CM

## 2012-12-30 DIAGNOSIS — S069XAS Unspecified intracranial injury with loss of consciousness status unknown, sequela: Secondary | ICD-10-CM

## 2012-12-30 DIAGNOSIS — F17200 Nicotine dependence, unspecified, uncomplicated: Secondary | ICD-10-CM | POA: Insufficient documentation

## 2012-12-30 DIAGNOSIS — G4730 Sleep apnea, unspecified: Secondary | ICD-10-CM | POA: Insufficient documentation

## 2012-12-30 DIAGNOSIS — S346XXS Injury of peripheral nerve(s) at abdomen, lower back and pelvis level, sequela: Secondary | ICD-10-CM | POA: Insufficient documentation

## 2012-12-30 DIAGNOSIS — J449 Chronic obstructive pulmonary disease, unspecified: Secondary | ICD-10-CM | POA: Insufficient documentation

## 2012-12-30 DIAGNOSIS — S8290XS Unspecified fracture of unspecified lower leg, sequela: Secondary | ICD-10-CM | POA: Insufficient documentation

## 2012-12-30 DIAGNOSIS — S069X9S Unspecified intracranial injury with loss of consciousness of unspecified duration, sequela: Secondary | ICD-10-CM

## 2012-12-30 DIAGNOSIS — S069X0S Unspecified intracranial injury without loss of consciousness, sequela: Secondary | ICD-10-CM

## 2012-12-30 MED ORDER — OXYCODONE HCL ER 20 MG PO T12A: 20.0000 mg | EXTENDED_RELEASE_TABLET | Freq: Two times a day (BID) | ORAL | Status: DC

## 2012-12-30 NOTE — Patient Instructions (Signed)
Try to find out whether your insurance supports the [employer], where they would pay for a membership in a Y.
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
## 2013-01-01 DIAGNOSIS — M65319 Trigger thumb, unspecified thumb: Secondary | ICD-10-CM

## 2013-01-01 DIAGNOSIS — H269 Unspecified cataract: Secondary | ICD-10-CM

## 2013-01-01 DIAGNOSIS — G5601 Carpal tunnel syndrome, right upper limb: Secondary | ICD-10-CM

## 2013-01-01 HISTORY — DX: Carpal tunnel syndrome, right upper limb: G56.01

## 2013-01-01 HISTORY — DX: Unspecified cataract: H26.9

## 2013-01-01 HISTORY — DX: Trigger thumb, unspecified thumb: M65.319

## 2013-01-04 ENCOUNTER — Other Ambulatory Visit: Payer: Self-pay | Admitting: Orthopedic Surgery

## 2013-01-10 ENCOUNTER — Encounter: Payer: Self-pay | Admitting: Neurology

## 2013-01-10 ENCOUNTER — Ambulatory Visit (INDEPENDENT_AMBULATORY_CARE_PROVIDER_SITE_OTHER): Payer: BC Managed Care – PPO | Admitting: Neurology

## 2013-01-10 VITALS — BP 129/79 | HR 80 | Ht 67.75 in | Wt 277.0 lb

## 2013-01-10 DIAGNOSIS — R4189 Other symptoms and signs involving cognitive functions and awareness: Secondary | ICD-10-CM

## 2013-01-10 DIAGNOSIS — F09 Unspecified mental disorder due to known physiological condition: Secondary | ICD-10-CM

## 2013-01-10 NOTE — Patient Instructions (Signed)
Overall you are doing fairly well but I do want to suggest a few things today:   Remember to drink plenty of fluid, eat healthy meals and do not skip any meals. Try to eat protein with a every meal and eat a healthy snack such as fruit or nuts in between meals. Try to keep a regular sleep-wake schedule and try to exercise daily, particularly in the form of walking, 20-30 minutes a day, if you can.   As far as your medications are concerned, I would like to suggest trying to taper down some of the pain medications. Discuss with your primary care physician about trying to taper down either the neurontin or oxycontin.   We will check some blood work today.  I suggest you follow up with the sleep clinic. Sleep apnea can be a common cause of fatigue and memory decline.  Consider working with your primary care doctor or a psychiatrist to get your mood/depression improved.   I would like to see you back in 4 to 6 months, sooner if we need to. Please call us with any interim questions, concerns, problems, updates or refill requests.   Please also call us for any test results so we can go over those with you on the phone.  My clinical assistant and will answer any of your questions and relay your messages to me and also relay most of my messages to you.   Our phone number is [phone]. We also have an after hours call service for urgent matters and there is a physician on-call for urgent questions. For any emergencies you know to call 911 or go to the nearest emergency room
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
## 2013-01-17 ENCOUNTER — Encounter (HOSPITAL_BASED_OUTPATIENT_CLINIC_OR_DEPARTMENT_OTHER): Payer: Self-pay | Admitting: *Deleted

## 2013-01-17 NOTE — Pre-Procedure Instructions (Signed)
To come for BMET

## 2013-01-18 LAB — VITAMIN B1, WHOLE BLOOD: Thiamine: 161.1 nmol/L (ref 66.5–200.0)

## 2013-01-18 LAB — VITAMIN B12: Vitamin B-12: 707 pg/mL (ref 211–946)

## 2013-01-18 LAB — METHYLMALONIC ACID, SERUM: Methylmalonic Acid: 274 nmol/L (ref 0–378)

## 2013-01-18 LAB — TSH: TSH: 4.11 u[IU]/mL (ref 0.450–4.500)

## 2013-01-19 ENCOUNTER — Encounter (HOSPITAL_BASED_OUTPATIENT_CLINIC_OR_DEPARTMENT_OTHER)
Admission: RE | Admit: 2013-01-19 | Discharge: 2013-01-19 | Disposition: A | Discharge status: Home / Self Care | Payer: BC Managed Care – PPO | Source: Ambulatory Visit | Attending: Orthopedic Surgery | Admitting: Orthopedic Surgery

## 2013-01-19 LAB — BASIC METABOLIC PANEL
BUN: 18 mg/dL (ref 6–23)
CO2: 28 mEq/L (ref 19–32)
Calcium: 9.7 mg/dL (ref 8.4–10.5)
Creatinine, Ser: 0.85 mg/dL (ref 0.50–1.10)
GFR calc non Af Amer: 75 mL/min — ABNORMAL LOW (ref >90)
Glucose, Bld: 254 mg/dL — ABNORMAL HIGH (ref 70–99)
Sodium: 136 mEq/L (ref 135–145)
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
## 2013-01-20 ENCOUNTER — Encounter (HOSPITAL_BASED_OUTPATIENT_CLINIC_OR_DEPARTMENT_OTHER): Payer: Self-pay

## 2013-01-20 ENCOUNTER — Ambulatory Visit (HOSPITAL_BASED_OUTPATIENT_CLINIC_OR_DEPARTMENT_OTHER)
Admission: RE | Admit: 2013-01-20 | Discharge: 2013-01-20 | Disposition: A | Discharge status: Home / Self Care | Payer: BC Managed Care – PPO | Source: Ambulatory Visit | Attending: Orthopedic Surgery | Admitting: Orthopedic Surgery

## 2013-01-20 ENCOUNTER — Encounter (HOSPITAL_BASED_OUTPATIENT_CLINIC_OR_DEPARTMENT_OTHER)
Admission: RE | Disposition: A | Discharge status: Home / Self Care | Payer: Self-pay | Source: Ambulatory Visit | Attending: Orthopedic Surgery

## 2013-01-20 ENCOUNTER — Encounter (HOSPITAL_BASED_OUTPATIENT_CLINIC_OR_DEPARTMENT_OTHER): Payer: BC Managed Care – PPO | Admitting: Anesthesiology

## 2013-01-20 ENCOUNTER — Ambulatory Visit (HOSPITAL_BASED_OUTPATIENT_CLINIC_OR_DEPARTMENT_OTHER): Payer: BC Managed Care – PPO | Admitting: Anesthesiology

## 2013-01-20 DIAGNOSIS — E785 Hyperlipidemia, unspecified: Secondary | ICD-10-CM | POA: Insufficient documentation

## 2013-01-20 DIAGNOSIS — M65839 Other synovitis and tenosynovitis, unspecified forearm: Secondary | ICD-10-CM | POA: Insufficient documentation

## 2013-01-20 DIAGNOSIS — G4730 Sleep apnea, unspecified: Secondary | ICD-10-CM | POA: Insufficient documentation

## 2013-01-20 DIAGNOSIS — G5600 Carpal tunnel syndrome, unspecified upper limb: Secondary | ICD-10-CM | POA: Insufficient documentation

## 2013-01-20 DIAGNOSIS — Z794 Long term (current) use of insulin: Secondary | ICD-10-CM | POA: Insufficient documentation

## 2013-01-20 DIAGNOSIS — Z01812 Encounter for preprocedural laboratory examination: Secondary | ICD-10-CM | POA: Insufficient documentation

## 2013-01-20 DIAGNOSIS — Z8614 Personal history of Methicillin resistant Staphylococcus aureus infection: Secondary | ICD-10-CM | POA: Insufficient documentation

## 2013-01-20 DIAGNOSIS — I10 Essential (primary) hypertension: Secondary | ICD-10-CM | POA: Insufficient documentation

## 2013-01-20 DIAGNOSIS — E119 Type 2 diabetes mellitus without complications: Secondary | ICD-10-CM | POA: Insufficient documentation

## 2013-01-20 DIAGNOSIS — K219 Gastro-esophageal reflux disease without esophagitis: Secondary | ICD-10-CM | POA: Insufficient documentation

## 2013-01-20 HISTORY — DX: Long term (current) use of insulin: Z79.4

## 2013-01-20 HISTORY — DX: Personal history of urinary calculi: Z87.442

## 2013-01-20 HISTORY — DX: Unspecified cataract: H26.9

## 2013-01-20 HISTORY — DX: Personal history of Methicillin resistant Staphylococcus aureus infection: Z86.14

## 2013-01-20 HISTORY — DX: Shortness of breath: R06.02

## 2013-01-20 HISTORY — DX: Trigger thumb, unspecified thumb: M65.319

## 2013-01-20 HISTORY — DX: Unspecified convulsions: R56.9

## 2013-01-20 HISTORY — DX: Personal history of other diseases of the circulatory system: Z86.79

## 2013-01-20 HISTORY — DX: Personal history of other diseases of the nervous system and sense organs: Z86.69

## 2013-01-20 HISTORY — PX: CARPAL TUNNEL RELEASE: SHX101

## 2013-01-20 HISTORY — DX: Foot drop, left foot: M21.372

## 2013-01-20 HISTORY — DX: Reserved for inherently not codable concepts without codable children: IMO0001

## 2013-01-20 HISTORY — DX: Other amnesia: R41.3

## 2013-01-20 HISTORY — DX: Carpal tunnel syndrome, right upper limb: G56.01

## 2013-01-20 HISTORY — DX: Conversion disorder with seizures or convulsions: F44.5

## 2013-01-20 HISTORY — DX: Gastro-esophageal reflux disease without esophagitis: K21.9

## 2013-01-20 HISTORY — DX: Type 2 diabetes mellitus without complications: E11.9

## 2013-01-20 HISTORY — PX: TRIGGER FINGER RELEASE: SHX641

## 2013-01-20 LAB — POCT HEMOGLOBIN-HEMACUE: Hemoglobin: 15.6 g/dL — ABNORMAL HIGH (ref 12.0–15.0)

## 2013-01-20 LAB — GLUCOSE, CAPILLARY: Glucose-Capillary: 201 mg/dL — ABNORMAL HIGH (ref 70–99)

## 2013-01-20 SURGERY — CARPAL TUNNEL RELEASE
Anesthesia: General | Site: Hand | Laterality: Right | Wound class: Clean

## 2013-01-20 MED ORDER — LIDOCAINE HCL (CARDIAC) 20 MG/ML IV SOLN
INTRAVENOUS | Status: DC | PRN
  Administered 2013-01-20: 80 mg via INTRAVENOUS

## 2013-01-20 MED ORDER — MIDAZOLAM HCL 2 MG/2ML IJ SOLN
INTRAMUSCULAR | Status: AC
  Filled 2013-01-20: qty 2

## 2013-01-20 MED ORDER — CHLORHEXIDINE GLUCONATE 4 % EX LIQD: 60.0000 mL | Freq: Once | CUTANEOUS | Status: DC

## 2013-01-20 MED ORDER — FENTANYL CITRATE 0.05 MG/ML IJ SOLN
INTRAMUSCULAR | Status: DC | PRN
  Administered 2013-01-20: 50 ug via INTRAVENOUS

## 2013-01-20 MED ORDER — PROPOFOL 10 MG/ML IV BOLUS
INTRAVENOUS | Status: DC | PRN
  Administered 2013-01-20: 200 mg via INTRAVENOUS

## 2013-01-20 MED ORDER — HYDROMORPHONE HCL 2 MG PO TABS: 2.0000 mg | ORAL_TABLET | ORAL | Status: DC | PRN

## 2013-01-20 MED ORDER — FENTANYL CITRATE 0.05 MG/ML IJ SOLN: 50.0000 ug | INTRAMUSCULAR | Status: DC | PRN

## 2013-01-20 MED ORDER — MIDAZOLAM HCL 2 MG/2ML IJ SOLN: 1.0000 mg | INTRAMUSCULAR | Status: DC | PRN

## 2013-01-20 MED ORDER — LACTATED RINGERS IV SOLN
INTRAVENOUS | Status: DC
  Administered 2013-01-20 (×2): via INTRAVENOUS

## 2013-01-20 MED ORDER — EPHEDRINE SULFATE 50 MG/ML IJ SOLN
INTRAMUSCULAR | Status: DC | PRN
  Administered 2013-01-20: 10 mg via INTRAVENOUS

## 2013-01-20 MED ORDER — MIDAZOLAM HCL 5 MG/5ML IJ SOLN
INTRAMUSCULAR | Status: DC | PRN
  Administered 2013-01-20: 2 mg via INTRAVENOUS

## 2013-01-20 MED ORDER — FENTANYL CITRATE 0.05 MG/ML IJ SOLN: 25.0000 ug | INTRAMUSCULAR | Status: DC | PRN

## 2013-01-20 MED ORDER — OXYCODONE HCL 5 MG PO TABS: 5.0000 mg | ORAL_TABLET | Freq: Once | ORAL | Status: DC | PRN

## 2013-01-20 MED ORDER — ONDANSETRON HCL 4 MG/2ML IJ SOLN
INTRAMUSCULAR | Status: DC | PRN
  Administered 2013-01-20: 4 mg via INTRAVENOUS

## 2013-01-20 MED ORDER — OXYCODONE HCL 5 MG/5ML PO SOLN: 5.0000 mg | Freq: Once | ORAL | Status: DC | PRN

## 2013-01-20 MED ORDER — FENTANYL CITRATE 0.05 MG/ML IJ SOLN
INTRAMUSCULAR | Status: AC
  Filled 2013-01-20: qty 2

## 2013-01-20 MED ORDER — ONDANSETRON HCL 4 MG/2ML IJ SOLN: 4.0000 mg | Freq: Four times a day (QID) | INTRAMUSCULAR | Status: DC | PRN

## 2013-01-20 MED ORDER — MIDAZOLAM HCL 2 MG/ML PO SYRP: 12.0000 mg | ORAL_SOLUTION | Freq: Once | ORAL | Status: DC | PRN

## 2013-01-20 MED ORDER — LIDOCAINE HCL 2 % IJ SOLN
INTRAMUSCULAR | Status: DC | PRN
  Administered 2013-01-20: 4 mL

## 2013-01-20 MED ORDER — PROPOFOL 10 MG/ML IV EMUL
INTRAVENOUS | Status: AC
  Filled 2013-01-20: qty 50

## 2013-01-20 SURGICAL SUPPLY — 46 items
BANDAGE ADHESIVE 1X3 (GAUZE/BANDAGES/DRESSINGS) IMPLANT
BANDAGE COBAN STERILE 2 (GAUZE/BANDAGES/DRESSINGS) ×3 IMPLANT
BANDAGE ELASTIC 3 VELCRO ST LF (GAUZE/BANDAGES/DRESSINGS) ×3 IMPLANT
BLADE SURG 15 STRL LF DISP TIS (BLADE) ×2 IMPLANT
BLADE SURG 15 STRL SS (BLADE) ×3
BNDG CMPR 9X4 STRL LF SNTH (GAUZE/BANDAGES/DRESSINGS) ×2
BNDG COHESIVE 3X5 TAN STRL LF (GAUZE/BANDAGES/DRESSINGS) IMPLANT
BNDG ESMARK 4X9 LF (GAUZE/BANDAGES/DRESSINGS) ×1 IMPLANT
BRUSH SCRUB EZ PLAIN DRY (MISCELLANEOUS) ×3 IMPLANT
CORDS BIPOLAR (ELECTRODE) ×1 IMPLANT
COVER MAYO STAND STRL (DRAPES) ×3 IMPLANT
COVER TABLE BACK 60X90 (DRAPES) ×3 IMPLANT
CUFF TOURNIQUET SINGLE 18IN (TOURNIQUET CUFF) IMPLANT
CUFF TOURNIQUET SINGLE 24IN (TOURNIQUET CUFF) ×1 IMPLANT
DECANTER SPIKE VIAL GLASS SM (MISCELLANEOUS) ×1 IMPLANT
DRAPE EXTREMITY T 121X128X90 (DRAPE) ×3 IMPLANT
DRAPE SURG 17X23 STRL (DRAPES) ×3 IMPLANT
GLOVE BIOGEL M STRL SZ7.5 (GLOVE) ×2 IMPLANT
GLOVE BIOGEL PI IND STRL 7.0 (GLOVE) IMPLANT
GLOVE BIOGEL PI INDICATOR 7.0 (GLOVE) ×1
GLOVE ECLIPSE 7.0 STRL STRAW (GLOVE) ×1 IMPLANT
GLOVE EXAM NITRILE MD LF STRL (GLOVE) ×1 IMPLANT
GLOVE ORTHO TXT STRL SZ7.5 (GLOVE) ×4 IMPLANT
GOWN BRE IMP PREV XXLGXLNG (GOWN DISPOSABLE) ×5 IMPLANT
GOWN PREVENTION PLUS XLARGE (GOWN DISPOSABLE) ×3 IMPLANT
NEEDLE 27GAX1X1/2 (NEEDLE) ×1 IMPLANT
PACK BASIN DAY SURGERY FS (CUSTOM PROCEDURE TRAY) ×3 IMPLANT
PAD CAST 3X4 CTTN HI CHSV (CAST SUPPLIES) ×2 IMPLANT
PADDING CAST ABS 4INX4YD NS (CAST SUPPLIES) ×1
PADDING CAST ABS COTTON 4X4 ST (CAST SUPPLIES) ×2 IMPLANT
PADDING CAST COTTON 3X4 STRL (CAST SUPPLIES) ×3
SLEEVE SCD COMPRESS KNEE MED (MISCELLANEOUS) ×1 IMPLANT
SPLINT PLASTER CAST XFAST 3X15 (CAST SUPPLIES) ×10 IMPLANT
SPLINT PLASTER XTRA FASTSET 3X (CAST SUPPLIES) ×5
SPONGE GAUZE 4X4 12PLY (GAUZE/BANDAGES/DRESSINGS) ×3 IMPLANT
STOCKINETTE 4X48 STRL (DRAPES) ×3 IMPLANT
STRIP CLOSURE SKIN 1/2X4 (GAUZE/BANDAGES/DRESSINGS) ×3 IMPLANT
SUT ETHILON 5 0 P 3 18 (SUTURE)
SUT NYLON ETHILON 5-0 P-3 1X18 (SUTURE) IMPLANT
SUT PROLENE 3 0 PS 2 (SUTURE) ×3 IMPLANT
SUT PROLENE 4 0 P 3 18 (SUTURE) IMPLANT
SYR 3ML 23GX1 SAFETY (SYRINGE) IMPLANT
SYR CONTROL 10ML LL (SYRINGE) ×1 IMPLANT
TOWEL OR 17X24 6PK STRL BLUE (TOWEL DISPOSABLE) ×3 IMPLANT
TRAY DSU PREP LF (CUSTOM PROCEDURE TRAY) ×3 IMPLANT
UNDERPAD 30X30 INCONTINENT (UNDERPADS AND DIAPERS) ×3 IMPLANT

## 2013-01-20 NOTE — Brief Op Note (Signed)
01/20/2013  10:12 AM  PATIENT:  Derek Mound  57 y.o. female  PRE-OPERATIVE DIAGNOSIS:  RIGHT CARPAL TUNNEL SYNDROME,RIGHT THUMB STENOSING TENOSYNOVITIS  POST-OPERATIVE DIAGNOSIS:  RIGHT CARPAL TUNNEL SYNDROME,RIGHT THUMB STENOSING TENOSYNOVITIS   PROCEDURE:  Procedure(s): RIGHT CARPAL TUNNEL RELEASE (Right) RELEASE RIGHT THUMB A-1 PULLEY (Right)  SURGEON:  Surgeon(s) and Role:    * Cammie Sickle., MD - Primary  PHYSICIAN ASSISTANT:   ASSISTANTS: surgical technician   ANESTHESIA:   general  EBL:  Total I/O In: 900 [I.V.:900] Out: -   BLOOD ADMINISTERED:none  DRAINS: none   LOCAL MEDICATIONS USED:  LIDOCAINE   SPECIMEN:  No Specimen  DISPOSITION OF SPECIMEN:  N/A  COUNTS:  YES  TOURNIQUET:   Total Tourniquet Time Documented: Upper Arm (laterality) - 19 minutes Total: Upper Arm (laterality) - 19 minutes   DICTATION: .Other Dictation: Dictation Number [id number]  PLAN OF CARE: Discharge to home after PACU  PATIENT DISPOSITION:  PACU - hemodynamically stable.   Delay start of Pharmacological VTE agent (>24hrs) due to surgical blood loss or risk of bleeding: not applicable

## 2013-01-20 NOTE — Anesthesia Preprocedure Evaluation (Signed)
Anesthesia Evaluation  Patient identified by MRN, date of birth, ID band Patient awake    Reviewed: Allergy & Precautions, H&P , NPO status , Patient's Chart, lab work & pertinent test results  Airway Mallampati: II  Neck ROM: full    Dental   Pulmonary shortness of breath, sleep apnea , Current Smoker,          Cardiovascular hypertension,     Neuro/Psych  Headaches, Anxiety Depression  Neuromuscular disease    GI/Hepatic GERD-  ,  Endo/Other  diabetes, Type 2Morbid obesity  Renal/GU      Musculoskeletal   Abdominal   Peds  Hematology   Anesthesia Other Findings   Reproductive/Obstetrics                           Anesthesia Physical Anesthesia Plan  ASA: III  Anesthesia Plan: General   Post-op Pain Management:    Induction: Intravenous  Airway Management Planned: LMA  Additional Equipment:   Intra-op Plan:   Post-operative Plan:   Informed Consent: I have reviewed the patients History and Physical, chart, labs and discussed the procedure including the risks, benefits and alternatives for the proposed anesthesia with the patient or authorized representative who has indicated his/her understanding and acceptance.     Plan Discussed with: CRNA, Anesthesiologist and Surgeon  Anesthesia Plan Comments:         Anesthesia Quick Evaluation

## 2013-01-20 NOTE — Transfer of Care (Signed)
Immediate Anesthesia Transfer of Care Note  Patient: Katherine [name]  Procedure(s) Performed: Procedure(s): RIGHT CARPAL TUNNEL RELEASE (Right) RELEASE RIGHT THUMB A-1 PULLEY (Right)  Patient Location: PACU  Anesthesia Type:General  Level of Consciousness: awake, sedated and patient cooperative  Airway & Oxygen Therapy: Patient Spontanous Breathing and Patient connected to face mask oxygen  Post-op Assessment: Report given to PACU RN and Post -op Vital signs reviewed and stable  Post vital signs: Reviewed and stable  Complications: No apparent anesthesia complications

## 2013-01-20 NOTE — Anesthesia Postprocedure Evaluation (Signed)
Anesthesia Post Note  Patient: Katherine [name]  Procedure(s) Performed: Procedure(s) (LRB): RIGHT CARPAL TUNNEL RELEASE (Right) RELEASE RIGHT THUMB A-1 PULLEY (Right)  Anesthesia type: General  Patient location: PACU  Post pain: Pain level controlled and Adequate analgesia  Post assessment: Post-op Vital signs reviewed, Patient's Cardiovascular Status Stable, Respiratory Function Stable, Patent Airway and Pain level controlled  Last Vitals:  Filed Vitals:   01/20/13 1045  BP: 137/86  Pulse: 83  Temp:   Resp: 17    Post vital signs: Reviewed and stable  Level of consciousness: awake, alert  and oriented  Complications: No apparent anesthesia complications

## 2013-01-20 NOTE — Op Note (Signed)
[id number] 

## 2013-01-21 ENCOUNTER — Encounter (HOSPITAL_BASED_OUTPATIENT_CLINIC_OR_DEPARTMENT_OTHER): Payer: Self-pay | Admitting: Orthopedic Surgery

## 2013-01-21 NOTE — Op Note (Signed)
NAMESHENE, MAXFIELD             ACCOUNT NO.:  [id number]  MEDICAL RECORD NO.:  [id number]  LOCATION:                               FACILITY:  [facility]  PHYSICIAN:  Youlanda Mighty. Lakisha Peyser, M.D. DATE OF BIRTH:  [birth year]  DATE OF PROCEDURE:  01/20/2013 DATE OF DISCHARGE:  01/20/2013                              OPERATIVE REPORT   PREOPERATIVE DIAGNOSES: 1. Entrapment neuropathy right median nerve and carpal tunnel. 2. Chronic stenosing tenosynovitis of right thumb at A1 pulley.  Both     associated background diabetes and multiple medical problems.  POSTOPERATIVE DIAGNOSES: 1. Entrapment neuropathy right median nerve and carpal tunnel. 2. Chronic stenosing tenosynovitis of right thumb at A1 pulley.  Both     associated background diabetes and multiple medical problems.  OPERATION: 1. Release of right transverse carpal ligament. 2. Release of right thumb A1 pulley.  OPERATING SURGEON:  Youlanda Mighty. Stacyann Mcconaughy, MD  ASSISTANT:  Surgical technician.  ANESTHESIA:  General by LMA.  SUPERVISING ANESTHESIOLOGIST:  Albertha Ghee, MD.  INDICATIONS:  Baylei Siebels is a 57 year old disabled woman referred for evaluation and management of hand numbness and triggering of her right thumb.  Clinical examination revealed evidence of chronic entrapment neuropathy at the wrist level of the median nerve bilaterally.  She was noted to have stenosing tenosynovitis of her right thumb, minimal crepitation over her right long finger flexor sheath at the A1 pulley and triggering of her left thumb intermittently.  Complete electrodiagnostic studies which revealed significant carpal tunnel syndrome.  She is now brought to the operating room anticipating release of her right transverse carpal ligament and right thumb A1 pulley.  She was interviewed in the holding area and questions regarding the anticipated surgery were invited and answered in detail.  Her right thumb and right palm were marked as the  proper surgical site per the identification protocol with a marking pen.  She was interviewed by Dr. Marcie Bal of Anesthesia and questions invited and answered in detail. General anesthesia by LMA technique was recommended and accepted.  DESCRIPTION OF PROCEDURE:  Falicia Lizotte is brought to room 2 of the [facility] and placed in supine position on the operating table.  Following the induction of general anesthesia by LMA technique, the right hand and arm were prepped with Betadine soap and solution and sterilely draped.  A pneumatic tourniquet was applied to the proximal right brachium.  Following exsanguination of the right arm with an Esmarch bandage, the arterial tourniquet was inflated to 220 mmHg.  Following routine surgical time-out, procedure commenced with a transverse incision directly over the palpably thickened right thumb A1 pulley. Subcutaneous tissues were carefully divided taking care to identify the radial proper digital nerve and artery.  The neurovascular structures were gently retracted with a Ragnell retractor followed by exposure of the A1 pulley.  The pulley was split with scalpel scissors.  The scissors with closed tips were passed proximally and distally to be sure there were no other sites of stenosis.  Thereafter, free passive range of motion of the IP joint was recovered.  The wound was repaired with intradermal 3-0 Prolene suture and Steri- Strips.  Attention was then directed to the proximal  right palm.  A skin incision was fashioned in the line of the ring finger.  Subcutaneous tissues were carefully divided revealing the palmar fascia.  This was split longitudinally to reveal the common sensory branch of the median nerve. These were followed back to the transverse carpal ligament which was gently isolated from the median nerve with a Insurance account manager.  After showing that there were no aberrant motor branches, we released the transverse  carpal ligament along its ulnar border extending into the distal forearm.  The volar forearm fascia was released with scissors 4 cm above the distal wrist flexion crease.  This widely opened the carpal canal.  The ulnar bursa was visualized. No masses were noted.  The wound was then inspected for bleeding points which were electrocauterized with bipolar forceps followed by repair of the skin with intradermal 3-0 Prolene and Steri-Strips. A compressive dressing was applied with sterile gauze, sterile Webril, and a volar plaster splint maintaining the wrist in 15 degrees of dorsiflexion.  Coban wrap was applied to complete the dressing.     Youlanda Mighty Azarian Starace, M.D.     RVS/MEDQ  D:  01/20/2013  T:  01/20/2013  Job:  [id number]

## 2013-01-31 ENCOUNTER — Encounter: Payer: Self-pay | Admitting: Physical Medicine and Rehabilitation

## 2013-01-31 ENCOUNTER — Encounter
Payer: BC Managed Care – PPO | Attending: Physical Medicine and Rehabilitation | Admitting: Physical Medicine and Rehabilitation

## 2013-01-31 VITALS — BP 143/77 | HR 90 | Resp 14 | Ht 66.0 in | Wt 275.0 lb

## 2013-01-31 DIAGNOSIS — Z5189 Encounter for other specified aftercare: Secondary | ICD-10-CM

## 2013-01-31 DIAGNOSIS — T1490XA Injury, unspecified, initial encounter: Secondary | ICD-10-CM

## 2013-01-31 DIAGNOSIS — S8290XD Unspecified fracture of unspecified lower leg, subsequent encounter for closed fracture with routine healing: Secondary | ICD-10-CM

## 2013-01-31 DIAGNOSIS — S069XAS Unspecified intracranial injury with loss of consciousness status unknown, sequela: Secondary | ICD-10-CM | POA: Insufficient documentation

## 2013-01-31 DIAGNOSIS — S8490XS Injury of unspecified nerve at lower leg level, unspecified leg, sequela: Secondary | ICD-10-CM

## 2013-01-31 DIAGNOSIS — S82402D Unspecified fracture of shaft of left fibula, subsequent encounter for closed fracture with routine healing: Secondary | ICD-10-CM

## 2013-01-31 DIAGNOSIS — I10 Essential (primary) hypertension: Secondary | ICD-10-CM | POA: Insufficient documentation

## 2013-01-31 DIAGNOSIS — M79609 Pain in unspecified limb: Secondary | ICD-10-CM | POA: Insufficient documentation

## 2013-01-31 DIAGNOSIS — E785 Hyperlipidemia, unspecified: Secondary | ICD-10-CM | POA: Insufficient documentation

## 2013-01-31 DIAGNOSIS — K219 Gastro-esophageal reflux disease without esophagitis: Secondary | ICD-10-CM | POA: Insufficient documentation

## 2013-01-31 DIAGNOSIS — R600 Localized edema: Secondary | ICD-10-CM

## 2013-01-31 DIAGNOSIS — X58XXXS Exposure to other specified factors, sequela: Secondary | ICD-10-CM | POA: Insufficient documentation

## 2013-01-31 DIAGNOSIS — S7012XD Contusion of left thigh, subsequent encounter: Secondary | ICD-10-CM

## 2013-01-31 DIAGNOSIS — G8921 Chronic pain due to trauma: Secondary | ICD-10-CM

## 2013-01-31 DIAGNOSIS — S069X9S Unspecified intracranial injury with loss of consciousness of unspecified duration, sequela: Secondary | ICD-10-CM

## 2013-01-31 DIAGNOSIS — S8290XS Unspecified fracture of unspecified lower leg, sequela: Secondary | ICD-10-CM | POA: Insufficient documentation

## 2013-01-31 DIAGNOSIS — G4730 Sleep apnea, unspecified: Secondary | ICD-10-CM | POA: Insufficient documentation

## 2013-01-31 DIAGNOSIS — R609 Edema, unspecified: Secondary | ICD-10-CM

## 2013-01-31 DIAGNOSIS — S8412XS Injury of peroneal nerve at lower leg level, left leg, sequela: Secondary | ICD-10-CM

## 2013-01-31 DIAGNOSIS — F17200 Nicotine dependence, unspecified, uncomplicated: Secondary | ICD-10-CM | POA: Insufficient documentation

## 2013-01-31 DIAGNOSIS — S069X0S Unspecified intracranial injury without loss of consciousness, sequela: Secondary | ICD-10-CM

## 2013-01-31 DIAGNOSIS — S346XXS Injury of peripheral nerve(s) at abdomen, lower back and pelvis level, sequela: Secondary | ICD-10-CM | POA: Insufficient documentation

## 2013-01-31 DIAGNOSIS — S8412XD Injury of peroneal nerve at lower leg level, left leg, subsequent encounter: Secondary | ICD-10-CM

## 2013-01-31 MED ORDER — OXYCODONE HCL ER 20 MG PO T12A: 20.0000 mg | EXTENDED_RELEASE_TABLET | Freq: Two times a day (BID) | ORAL | Status: DC
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
## 2013-01-31 NOTE — Patient Instructions (Addendum)
Stay as active as tolerated, and follow the instructions of your hand surgeon

## 2013-02-01 ENCOUNTER — Other Ambulatory Visit: Payer: Self-pay | Admitting: Orthopedic Surgery

## 2013-02-03 ENCOUNTER — Other Ambulatory Visit: Payer: Self-pay | Admitting: Physical Medicine & Rehabilitation

## 2013-02-04 ENCOUNTER — Ambulatory Visit: Payer: BC Managed Care – PPO | Admitting: Pulmonary Disease

## 2013-02-07 ENCOUNTER — Encounter (HOSPITAL_BASED_OUTPATIENT_CLINIC_OR_DEPARTMENT_OTHER): Payer: Self-pay | Admitting: *Deleted

## 2013-02-07 NOTE — Progress Notes (Signed)
Here for rt ctr-01/20/13-bmet-01/19/13 still good-ekg 7/14 still good No more labs needed
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
## 2013-02-10 ENCOUNTER — Encounter (HOSPITAL_BASED_OUTPATIENT_CLINIC_OR_DEPARTMENT_OTHER): Payer: BC Managed Care – PPO | Admitting: Anesthesiology

## 2013-02-10 ENCOUNTER — Encounter (HOSPITAL_BASED_OUTPATIENT_CLINIC_OR_DEPARTMENT_OTHER)
Admission: RE | Disposition: A | Discharge status: Home / Self Care | Payer: Self-pay | Source: Ambulatory Visit | Attending: Orthopedic Surgery

## 2013-02-10 ENCOUNTER — Ambulatory Visit (HOSPITAL_BASED_OUTPATIENT_CLINIC_OR_DEPARTMENT_OTHER)
Admission: RE | Admit: 2013-02-10 | Discharge: 2013-02-10 | Disposition: A | Discharge status: Home / Self Care | Payer: BC Managed Care – PPO | Source: Ambulatory Visit | Attending: Orthopedic Surgery | Admitting: Orthopedic Surgery

## 2013-02-10 ENCOUNTER — Encounter (HOSPITAL_BASED_OUTPATIENT_CLINIC_OR_DEPARTMENT_OTHER): Payer: Self-pay | Admitting: Orthopedic Surgery

## 2013-02-10 ENCOUNTER — Ambulatory Visit (HOSPITAL_BASED_OUTPATIENT_CLINIC_OR_DEPARTMENT_OTHER): Payer: BC Managed Care – PPO | Admitting: Anesthesiology

## 2013-02-10 DIAGNOSIS — E109 Type 1 diabetes mellitus without complications: Secondary | ICD-10-CM | POA: Insufficient documentation

## 2013-02-10 DIAGNOSIS — I10 Essential (primary) hypertension: Secondary | ICD-10-CM | POA: Insufficient documentation

## 2013-02-10 DIAGNOSIS — Z794 Long term (current) use of insulin: Secondary | ICD-10-CM | POA: Insufficient documentation

## 2013-02-10 DIAGNOSIS — K219 Gastro-esophageal reflux disease without esophagitis: Secondary | ICD-10-CM | POA: Insufficient documentation

## 2013-02-10 DIAGNOSIS — E785 Hyperlipidemia, unspecified: Secondary | ICD-10-CM | POA: Insufficient documentation

## 2013-02-10 DIAGNOSIS — F3289 Other specified depressive episodes: Secondary | ICD-10-CM | POA: Insufficient documentation

## 2013-02-10 DIAGNOSIS — G4730 Sleep apnea, unspecified: Secondary | ICD-10-CM | POA: Insufficient documentation

## 2013-02-10 DIAGNOSIS — F411 Generalized anxiety disorder: Secondary | ICD-10-CM | POA: Insufficient documentation

## 2013-02-10 DIAGNOSIS — F329 Major depressive disorder, single episode, unspecified: Secondary | ICD-10-CM | POA: Insufficient documentation

## 2013-02-10 DIAGNOSIS — G5600 Carpal tunnel syndrome, unspecified upper limb: Secondary | ICD-10-CM | POA: Insufficient documentation

## 2013-02-10 DIAGNOSIS — F17200 Nicotine dependence, unspecified, uncomplicated: Secondary | ICD-10-CM | POA: Insufficient documentation

## 2013-02-10 HISTORY — PX: CARPAL TUNNEL RELEASE: SHX101

## 2013-02-10 LAB — GLUCOSE, CAPILLARY
Glucose-Capillary: 184 mg/dL — ABNORMAL HIGH (ref 70–99)
Glucose-Capillary: 180 mg/dL — ABNORMAL HIGH (ref 70–99)

## 2013-02-10 LAB — POCT HEMOGLOBIN-HEMACUE: Hemoglobin: 14.3 g/dL (ref 12.0–15.0)

## 2013-02-10 SURGERY — CARPAL TUNNEL RELEASE
Anesthesia: General | Site: Wrist | Laterality: Left

## 2013-02-10 MED ORDER — PROPOFOL 10 MG/ML IV BOLUS
INTRAVENOUS | Status: DC | PRN
  Administered 2013-02-10: 150 mg via INTRAVENOUS

## 2013-02-10 MED ORDER — OXYCODONE-ACETAMINOPHEN 5-325 MG PO TABS: 1.0000 | ORAL_TABLET | ORAL | Status: DC | PRN

## 2013-02-10 MED ORDER — HYDROMORPHONE HCL PF 1 MG/ML IJ SOLN
0.2500 mg | INTRAMUSCULAR | Status: DC | PRN
Start: 2013-02-10 — End: 2013-02-10

## 2013-02-10 MED ORDER — LACTATED RINGERS IV SOLN
INTRAVENOUS | Status: DC
  Administered 2013-02-10: 10:00:00 via INTRAVENOUS

## 2013-02-10 MED ORDER — MIDAZOLAM HCL 5 MG/5ML IJ SOLN
INTRAMUSCULAR | Status: DC | PRN
  Administered 2013-02-10: 2 mg via INTRAVENOUS

## 2013-02-10 MED ORDER — LIDOCAINE HCL 2 % IJ SOLN
INTRAMUSCULAR | Status: DC | PRN
  Administered 2013-02-10: 4 mL

## 2013-02-10 MED ORDER — MIDAZOLAM HCL 2 MG/2ML IJ SOLN: 1.0000 mg | INTRAMUSCULAR | Status: DC | PRN

## 2013-02-10 MED ORDER — OXYCODONE HCL 5 MG PO TABS: 5.0000 mg | ORAL_TABLET | Freq: Once | ORAL | Status: DC | PRN

## 2013-02-10 MED ORDER — LIDOCAINE HCL (CARDIAC) 20 MG/ML IV SOLN
INTRAVENOUS | Status: DC | PRN
  Administered 2013-02-10: 60 mg via INTRAVENOUS

## 2013-02-10 MED ORDER — DEXAMETHASONE SODIUM PHOSPHATE 10 MG/ML IJ SOLN
INTRAMUSCULAR | Status: DC | PRN
  Administered 2013-02-10: 5 mg via INTRAVENOUS

## 2013-02-10 MED ORDER — OXYCODONE HCL 5 MG/5ML PO SOLN: 5.0000 mg | Freq: Once | ORAL | Status: DC | PRN

## 2013-02-10 MED ORDER — ONDANSETRON HCL 4 MG/2ML IJ SOLN: INTRAMUSCULAR | Status: DC | PRN

## 2013-02-10 MED ORDER — FENTANYL CITRATE 0.05 MG/ML IJ SOLN: 50.0000 ug | INTRAMUSCULAR | Status: DC | PRN

## 2013-02-10 MED ORDER — CHLORHEXIDINE GLUCONATE 4 % EX LIQD: 60.0000 mL | Freq: Once | CUTANEOUS | Status: DC

## 2013-02-10 MED ORDER — FENTANYL CITRATE 0.05 MG/ML IJ SOLN
INTRAMUSCULAR | Status: AC
  Filled 2013-02-10: qty 4

## 2013-02-10 MED ORDER — ONDANSETRON HCL 4 MG/2ML IJ SOLN
INTRAMUSCULAR | Status: DC | PRN
  Administered 2013-02-10: 4 mg via INTRAVENOUS

## 2013-02-10 MED ORDER — FENTANYL CITRATE 0.05 MG/ML IJ SOLN
INTRAMUSCULAR | Status: DC | PRN
  Administered 2013-02-10: 100 ug via INTRAVENOUS

## 2013-02-10 MED ORDER — MIDAZOLAM HCL 2 MG/2ML IJ SOLN
INTRAMUSCULAR | Status: AC
  Filled 2013-02-10: qty 2

## 2013-02-10 SURGICAL SUPPLY — 40 items
BANDAGE ADHESIVE 1X3 (GAUZE/BANDAGES/DRESSINGS) IMPLANT
BANDAGE ELASTIC 3 VELCRO ST LF (GAUZE/BANDAGES/DRESSINGS) ×1 IMPLANT
BLADE SURG 15 STRL LF DISP TIS (BLADE) ×1 IMPLANT
BLADE SURG 15 STRL SS (BLADE) ×2
BNDG CMPR 9X4 STRL LF SNTH (GAUZE/BANDAGES/DRESSINGS) ×1
BNDG COHESIVE 3X5 TAN STRL LF (GAUZE/BANDAGES/DRESSINGS) ×1 IMPLANT
BNDG ESMARK 4X9 LF (GAUZE/BANDAGES/DRESSINGS) ×1 IMPLANT
BRUSH SCRUB EZ PLAIN DRY (MISCELLANEOUS) ×2 IMPLANT
CORDS BIPOLAR (ELECTRODE) ×1 IMPLANT
COVER MAYO STAND STRL (DRAPES) ×2 IMPLANT
COVER TABLE BACK 60X90 (DRAPES) ×2 IMPLANT
CUFF TOURNIQUET SINGLE 18IN (TOURNIQUET CUFF) IMPLANT
CUFF TOURNIQUET SINGLE 24IN (TOURNIQUET CUFF) ×1 IMPLANT
DECANTER SPIKE VIAL GLASS SM (MISCELLANEOUS) IMPLANT
DRAPE EXTREMITY T 121X128X90 (DRAPE) ×2 IMPLANT
DRAPE SURG 17X23 STRL (DRAPES) ×2 IMPLANT
GLOVE BIOGEL M STRL SZ7.5 (GLOVE) ×1 IMPLANT
GLOVE BIOGEL PI IND STRL 7.0 (GLOVE) IMPLANT
GLOVE BIOGEL PI INDICATOR 7.0 (GLOVE) ×2
GLOVE ECLIPSE 6.5 STRL STRAW (GLOVE) ×1 IMPLANT
GLOVE ORTHO TXT STRL SZ7.5 (GLOVE) ×2 IMPLANT
GOWN BRE IMP PREV XXLGXLNG (GOWN DISPOSABLE) ×3 IMPLANT
GOWN PREVENTION PLUS XLARGE (GOWN DISPOSABLE) ×2 IMPLANT
NEEDLE 27GAX1X1/2 (NEEDLE) ×1 IMPLANT
PACK BASIN DAY SURGERY FS (CUSTOM PROCEDURE TRAY) ×2 IMPLANT
PAD CAST 3X4 CTTN HI CHSV (CAST SUPPLIES) ×1 IMPLANT
PADDING CAST ABS 4INX4YD NS (CAST SUPPLIES)
PADDING CAST ABS COTTON 4X4 ST (CAST SUPPLIES) ×1 IMPLANT
PADDING CAST COTTON 3X4 STRL (CAST SUPPLIES) ×2
SPLINT PLASTER CAST XFAST 3X15 (CAST SUPPLIES) ×5 IMPLANT
SPLINT PLASTER XTRA FASTSET 3X (CAST SUPPLIES) ×5
SPONGE GAUZE 4X4 12PLY (GAUZE/BANDAGES/DRESSINGS) ×1 IMPLANT
STOCKINETTE 4X48 STRL (DRAPES) ×2 IMPLANT
STRIP CLOSURE SKIN 1/2X4 (GAUZE/BANDAGES/DRESSINGS) ×2 IMPLANT
SUT PROLENE 3 0 PS 2 (SUTURE) ×2 IMPLANT
SYR 3ML 23GX1 SAFETY (SYRINGE) IMPLANT
SYR CONTROL 10ML LL (SYRINGE) ×1 IMPLANT
TOWEL OR 17X24 6PK STRL BLUE (TOWEL DISPOSABLE) ×2 IMPLANT
TRAY DSU PREP LF (CUSTOM PROCEDURE TRAY) ×2 IMPLANT
UNDERPAD 30X30 INCONTINENT (UNDERPADS AND DIAPERS) ×2 IMPLANT

## 2013-02-10 NOTE — Brief Op Note (Signed)
02/10/2013  11:07 AM  PATIENT:  Katherine [name]  57 y.o. female  PRE-OPERATIVE DIAGNOSIS:  LEFT CARPAL TUNNEL SYNDROME   POST-OPERATIVE DIAGNOSIS:  LEFT CARPAL TUNNEL SYNDROME   PROCEDURE:  Procedure(s): LEFT CARPAL TUNNEL RELEASE (Left)  SURGEON:  Surgeon(s) and Role:    * Cammie Sickle., MD - Primary  PHYSICIAN ASSISTANT:   ASSISTANTS: surgical technician  ANESTHESIA:   general  EBL:  Total I/O In: 300 [I.V.:300] Out: -   BLOOD ADMINISTERED:none  DRAINS: none   LOCAL MEDICATIONS USED:  XYLOCAINE   SPECIMEN:  No Specimen  DISPOSITION OF SPECIMEN:  N/A  COUNTS:  YES  TOURNIQUET:   Total Tourniquet Time Documented: Upper Arm (Left) - 9 minutes Total: Upper Arm (Left) - 9 minutes   DICTATION: .Other Dictation: Dictation Number [id number]  PLAN OF CARE: Discharge to home after PACU  PATIENT DISPOSITION:  PACU - hemodynamically stable.   Delay start of Pharmacological VTE agent (>24hrs) due to surgical blood loss or risk of bleeding: not applicable

## 2013-02-10 NOTE — Anesthesia Postprocedure Evaluation (Signed)
  Anesthesia Post-op Note  Patient: Katherine [name]  Procedure(s) Performed: Procedure(s): LEFT CARPAL TUNNEL RELEASE (Left)  Patient Location: PACU  Anesthesia Type:General  Level of Consciousness: awake and alert   Airway and Oxygen Therapy: Patient Spontanous Breathing  Post-op Pain: none  Post-op Assessment: Post-op Vital signs reviewed, Patient's Cardiovascular Status Stable and Respiratory Function Stable  Post-op Vital Signs: Reviewed  Filed Vitals:   02/10/13 1200  BP: 101/59  Pulse: 76  Temp:   Resp: 16    Complications: No apparent anesthesia complications

## 2013-02-10 NOTE — Transfer of Care (Signed)
Immediate Anesthesia Transfer of Care Note  Patient: Katherine [name]  Procedure(s) Performed: Procedure(s): LEFT CARPAL TUNNEL RELEASE (Left)  Patient Location: PACU  Anesthesia Type:General  Level of Consciousness: awake, alert  and oriented  Airway & Oxygen Therapy: Patient Spontanous Breathing and Patient connected to face mask oxygen  Post-op Assessment: Report given to PACU RN and Post -op Vital signs reviewed and stable  Post vital signs: Reviewed and stable  Complications: No apparent anesthesia complications

## 2013-02-10 NOTE — Anesthesia Preprocedure Evaluation (Signed)
Anesthesia Evaluation  Patient identified by MRN, date of birth, ID band Patient awake    Reviewed: Allergy & Precautions, H&P , NPO status , Patient's Chart, lab work & pertinent test results  Airway Mallampati: I TM Distance: >3 FB Neck ROM: Full    Dental no notable dental hx. (+) Teeth Intact and Dental Advisory Given   Pulmonary sleep apnea , Current Smoker,  breath sounds clear to auscultation  Pulmonary exam normal       Cardiovascular hypertension, On Medications negative cardio ROS  Rhythm:Regular Rate:Normal     Neuro/Psych  Headaches, PSYCHIATRIC DISORDERS Anxiety Depression    GI/Hepatic Neg liver ROS, GERD-  Medicated and Controlled,  Endo/Other  diabetes, Type 1, Insulin DependentMorbid obesity  Renal/GU negative Renal ROS  negative genitourinary   Musculoskeletal   Abdominal   Peds  Hematology negative hematology ROS (+)   Anesthesia Other Findings   Reproductive/Obstetrics negative OB ROS                           Anesthesia Physical Anesthesia Plan  ASA: III  Anesthesia Plan: General   Post-op Pain Management:    Induction: Intravenous  Airway Management Planned: LMA  Additional Equipment:   Intra-op Plan:   Post-operative Plan: Extubation in OR  Informed Consent: I have reviewed the patients History and Physical, chart, labs and discussed the procedure including the risks, benefits and alternatives for the proposed anesthesia with the patient or authorized representative who has indicated his/her understanding and acceptance.   Dental advisory given  Plan Discussed with: CRNA  Anesthesia Plan Comments:         Anesthesia Quick Evaluation

## 2013-02-10 NOTE — Anesthesia Procedure Notes (Signed)
Procedure Name: LMA Insertion Date/Time: 02/10/2013 10:32 AM Performed by: Maryella Shivers Pre-anesthesia Checklist: Patient identified, Emergency Drugs available, Suction available and Patient being monitored Patient Re-evaluated:Patient Re-evaluated prior to inductionOxygen Delivery Method: Circle System Utilized Preoxygenation: Pre-oxygenation with 100% oxygen Intubation Type: IV induction Ventilation: Mask ventilation without difficulty LMA: LMA inserted LMA Size: 4.0 Number of attempts: 1 Airway Equipment and Method: bite block Placement Confirmation: positive ETCO2 Tube secured with: Tape Dental Injury: Teeth and Oropharynx as per pre-operative assessment

## 2013-02-10 NOTE — Op Note (Signed)
[id number] 

## 2013-02-11 ENCOUNTER — Encounter (HOSPITAL_BASED_OUTPATIENT_CLINIC_OR_DEPARTMENT_OTHER): Payer: Self-pay | Admitting: Orthopedic Surgery

## 2013-02-11 NOTE — Op Note (Signed)
[name], [name]             ACCOUNT NO.:  [id number]  MEDICAL RECORD NO.:  [id number]  LOCATION:                                 FACILITY:  PHYSICIAN:  Youlanda Mighty. Jaycob Mcclenton, M.D. DATE OF BIRTH:  1955-06-26  DATE OF PROCEDURE:  02/10/2013 DATE OF DISCHARGE:                              OPERATIVE REPORT   PREOPERATIVE DIAGNOSIS:  Chronic left carpal tunnel syndrome.  POSTOPERATIVE DIAGNOSIS:  Chronic left carpal tunnel syndrome.  OPERATION:  Release of left transverse carpal ligament.  OPERATING SURGEON:  Youlanda Mighty. Bela Bonaparte, MD  ASSISTANT:  Surgical technician.  ANESTHESIA:  General by LMA.  SUPERVISING ANESTHESIOLOGIST:  Soledad Gerlach, MD.  INDICATIONS:  Katherine [name] is a 57 year old woman with multiple medical problems including hypertension, morbid obesity, obstructive sleep apnea, and insulin-dependent diabetes.  She is status post release of her right transverse carpal ligament three weeks prior and now returns for release of her left transverse carpal ligament.  Preoperatively, she had electrodiagnostic studies confirming significant entrapment neuropathy of the left median nerve at wrist level.  After informed consent, she is brought to the operating room at this time.  DESCRIPTION OF PROCEDURE:  Katherine [name] was brought to room 2 of the [facility], placed in supine position on the operating table.  After detailed anesthesia informed consent, Dr. Ola Spurr recommended general anesthesia by LMA technique.  This was induced under his direct supervision without complication.  The left hand and arm were then prepped with Betadine soap and solution and sterilely draped.  A pneumatic tourniquet was applied to the proximal left brachium. Following exsanguination of the left arm with Esmarch bandage, the arterial tourniquet was inflated to 250 mmHg.  Following routine surgical time-out, procedure commenced with short incision in the line of the  ring finger and the palm.  Subcutaneous tissues were carefully divided revealing the palmar fascia.  This was split longitudinally to reveal the common sensory branch of the median nerve.  These were followed back to the median nerve proper which was separated from the transverse carpal ligament with a Insurance account manager.  A subcutaneous pathway was created above the transverse carpal ligament.  The ligament was released along its ulnar border extending into the distal forearm 4 cm.  This widely opened the carpal canal.  The ulnar bursa was fibrotic and thickened.  No mass or other predicaments were noted.  Bleeding points along the margin of the released ligament were electrocauterized with bipolar current followed by repair of the skin with intradermal 3-0 Prolene.  Compresses was applied with a volar plaster splint maintaining the wrist in 15 degrees of dorsiflexion.  For aftercare, Katherine [name] was provided a prescription for Percocet 5 mg 1 p.o. q.4-6 hours p.r.n. pain, 20 tablets without refill.     Youlanda Mighty Gael Londo, M.D.     RVS/MEDQ  D:  02/10/2013  T:  02/11/2013  Job:  [id number]

## 2013-02-21 ENCOUNTER — Other Ambulatory Visit: Payer: Self-pay | Admitting: *Deleted

## 2013-02-21 DIAGNOSIS — S82402D Unspecified fracture of shaft of left fibula, subsequent encounter for closed fracture with routine healing: Secondary | ICD-10-CM

## 2013-02-21 DIAGNOSIS — S8412XD Injury of peroneal nerve at lower leg level, left leg, subsequent encounter: Secondary | ICD-10-CM

## 2013-02-21 DIAGNOSIS — R600 Localized edema: Secondary | ICD-10-CM

## 2013-02-21 DIAGNOSIS — T1490XA Injury, unspecified, initial encounter: Secondary | ICD-10-CM

## 2013-02-21 DIAGNOSIS — S7012XD Contusion of left thigh, subsequent encounter: Secondary | ICD-10-CM

## 2013-02-21 MED ORDER — OXYCODONE HCL 10 MG PO TABS: 10.0000 mg | ORAL_TABLET | Freq: Four times a day (QID) | ORAL | Status: DC | PRN

## 2013-02-21 MED ORDER — OXYCODONE HCL ER 20 MG PO T12A: 20.0000 mg | EXTENDED_RELEASE_TABLET | Freq: Two times a day (BID) | ORAL | Status: DC

## 2013-02-21 NOTE — Telephone Encounter (Signed)
RX printed early for controlled medication for the visit with RN on 03/04/13 (to be signed by MD)

## 2013-03-04 ENCOUNTER — Encounter: Payer: BC Managed Care – PPO | Admitting: *Deleted

## 2013-03-18 ENCOUNTER — Telehealth: Payer: Self-pay | Admitting: *Deleted

## 2013-03-18 NOTE — Telephone Encounter (Signed)
Insurance laspsed and waited for new card, which she has now.  She ran out of her meds last night.  What can she do?  I spoke with [name] and we will let her pick up rx today and make an appointment to be seen for next refill

## 2013-03-28 ENCOUNTER — Other Ambulatory Visit: Payer: Self-pay | Admitting: Physical Medicine & Rehabilitation

## 2013-04-20 ENCOUNTER — Encounter: Payer: BC Managed Care – PPO | Admitting: Physical Medicine & Rehabilitation

## 2013-04-20 ENCOUNTER — Other Ambulatory Visit: Payer: Self-pay | Admitting: *Deleted

## 2013-04-20 DIAGNOSIS — T1490XA Injury, unspecified, initial encounter: Secondary | ICD-10-CM

## 2013-04-20 DIAGNOSIS — R600 Localized edema: Secondary | ICD-10-CM

## 2013-04-20 MED ORDER — OXYCODONE HCL 10 MG PO TABS: 10.0000 mg | ORAL_TABLET | Freq: Four times a day (QID) | ORAL | Status: DC | PRN

## 2013-04-20 MED ORDER — OXYCODONE HCL ER 20 MG PO T12A: 20.0000 mg | EXTENDED_RELEASE_TABLET | Freq: Two times a day (BID) | ORAL | Status: DC

## 2013-04-20 NOTE — Telephone Encounter (Signed)
RX printed early for controlled medication for the visit with RN on 04/22/13 (to be signed by MD)

## 2013-04-22 ENCOUNTER — Encounter: Payer: Self-pay | Admitting: *Deleted

## 2013-04-22 ENCOUNTER — Encounter: Payer: BC Managed Care – PPO | Attending: Physical Medicine & Rehabilitation | Admitting: *Deleted

## 2013-04-22 VITALS — BP 127/71 | HR 79 | Resp 16 | Ht 66.0 in | Wt 271.0 lb

## 2013-04-22 DIAGNOSIS — S346XXS Injury of peripheral nerve(s) at abdomen, lower back and pelvis level, sequela: Secondary | ICD-10-CM | POA: Insufficient documentation

## 2013-04-22 DIAGNOSIS — S069XAS Unspecified intracranial injury with loss of consciousness status unknown, sequela: Secondary | ICD-10-CM | POA: Insufficient documentation

## 2013-04-22 DIAGNOSIS — S8290XS Unspecified fracture of unspecified lower leg, sequela: Secondary | ICD-10-CM | POA: Insufficient documentation

## 2013-04-22 DIAGNOSIS — F17200 Nicotine dependence, unspecified, uncomplicated: Secondary | ICD-10-CM | POA: Insufficient documentation

## 2013-04-22 DIAGNOSIS — S066XAA Traumatic subarachnoid hemorrhage with loss of consciousness status unknown, initial encounter: Secondary | ICD-10-CM

## 2013-04-22 DIAGNOSIS — S069X9S Unspecified intracranial injury with loss of consciousness of unspecified duration, sequela: Secondary | ICD-10-CM | POA: Insufficient documentation

## 2013-04-22 DIAGNOSIS — X58XXXS Exposure to other specified factors, sequela: Secondary | ICD-10-CM | POA: Insufficient documentation

## 2013-04-22 DIAGNOSIS — M19179 Post-traumatic osteoarthritis, unspecified ankle and foot: Secondary | ICD-10-CM

## 2013-04-22 DIAGNOSIS — R413 Other amnesia: Secondary | ICD-10-CM | POA: Insufficient documentation

## 2013-04-22 DIAGNOSIS — S066X9A Traumatic subarachnoid hemorrhage with loss of consciousness of unspecified duration, initial encounter: Secondary | ICD-10-CM

## 2013-04-22 DIAGNOSIS — S8490XS Injury of unspecified nerve at lower leg level, unspecified leg, sequela: Secondary | ICD-10-CM | POA: Insufficient documentation

## 2013-04-22 DIAGNOSIS — E119 Type 2 diabetes mellitus without complications: Secondary | ICD-10-CM | POA: Insufficient documentation

## 2013-04-22 DIAGNOSIS — F29 Unspecified psychosis not due to a substance or known physiological condition: Secondary | ICD-10-CM | POA: Insufficient documentation

## 2013-04-22 DIAGNOSIS — IMO0002 Reserved for concepts with insufficient information to code with codable children: Secondary | ICD-10-CM

## 2013-04-22 DIAGNOSIS — K219 Gastro-esophageal reflux disease without esophagitis: Secondary | ICD-10-CM | POA: Insufficient documentation

## 2013-04-22 DIAGNOSIS — I10 Essential (primary) hypertension: Secondary | ICD-10-CM | POA: Insufficient documentation

## 2013-04-22 DIAGNOSIS — Z9089 Acquired absence of other organs: Secondary | ICD-10-CM | POA: Insufficient documentation

## 2013-04-22 DIAGNOSIS — E785 Hyperlipidemia, unspecified: Secondary | ICD-10-CM | POA: Insufficient documentation

## 2013-04-22 DIAGNOSIS — G4730 Sleep apnea, unspecified: Secondary | ICD-10-CM | POA: Insufficient documentation

## 2013-04-22 DIAGNOSIS — Z79899 Other long term (current) drug therapy: Secondary | ICD-10-CM | POA: Insufficient documentation

## 2013-04-22 DIAGNOSIS — M79609 Pain in unspecified limb: Secondary | ICD-10-CM | POA: Insufficient documentation

## 2013-04-22 NOTE — Progress Notes (Signed)
Here for pill count and medication refills. Oxycontin 20 mg #60 Fill date 03/18/13   Today NV# 0  Oxycodone 10 mg #90  Fill date 03/18/13  today NV# 62.  She does not use the IR oxycodone as often as she can have it. Pill counts are appropriate. Refills given. VSS    Pain level:4-5  She has had no falls and she is a moderate fall risk.  I have educated her and given her a handout on fall prevention in the home.  She will return in one month to see me for refill and then two months with Dr Naaman Plummer.

## 2013-04-22 NOTE — Patient Instructions (Addendum)
Follow up one month for med refill and two months with DR Naaman Plummer

## 2013-05-16 ENCOUNTER — Other Ambulatory Visit: Payer: Self-pay | Admitting: *Deleted

## 2013-05-16 DIAGNOSIS — T1490XA Injury, unspecified, initial encounter: Secondary | ICD-10-CM

## 2013-05-16 DIAGNOSIS — R600 Localized edema: Secondary | ICD-10-CM

## 2013-05-16 MED ORDER — OXYCODONE HCL 10 MG PO TABS: 10.0000 mg | ORAL_TABLET | Freq: Four times a day (QID) | ORAL | Status: DC | PRN

## 2013-05-16 MED ORDER — OXYCODONE HCL ER 20 MG PO T12A: 20.0000 mg | EXTENDED_RELEASE_TABLET | Freq: Two times a day (BID) | ORAL | Status: DC

## 2013-05-16 NOTE — Telephone Encounter (Signed)
RX printed for MD to sign for RN visit 05/20/13

## 2013-05-20 ENCOUNTER — Encounter: Payer: BC Managed Care – PPO | Admitting: *Deleted

## 2013-05-25 ENCOUNTER — Encounter: Payer: BC Managed Care – PPO | Attending: Physical Medicine & Rehabilitation | Admitting: *Deleted

## 2013-05-25 ENCOUNTER — Encounter: Payer: Self-pay | Admitting: *Deleted

## 2013-05-25 VITALS — BP 138/57 | HR 65 | Resp 14

## 2013-05-25 DIAGNOSIS — I10 Essential (primary) hypertension: Secondary | ICD-10-CM | POA: Insufficient documentation

## 2013-05-25 DIAGNOSIS — E119 Type 2 diabetes mellitus without complications: Secondary | ICD-10-CM | POA: Insufficient documentation

## 2013-05-25 DIAGNOSIS — K219 Gastro-esophageal reflux disease without esophagitis: Secondary | ICD-10-CM | POA: Insufficient documentation

## 2013-05-25 DIAGNOSIS — Z79899 Other long term (current) drug therapy: Secondary | ICD-10-CM | POA: Insufficient documentation

## 2013-05-25 DIAGNOSIS — G894 Chronic pain syndrome: Secondary | ICD-10-CM

## 2013-05-25 DIAGNOSIS — S069X9S Unspecified intracranial injury with loss of consciousness of unspecified duration, sequela: Secondary | ICD-10-CM | POA: Insufficient documentation

## 2013-05-25 DIAGNOSIS — X58XXXS Exposure to other specified factors, sequela: Secondary | ICD-10-CM | POA: Insufficient documentation

## 2013-05-25 DIAGNOSIS — Z9089 Acquired absence of other organs: Secondary | ICD-10-CM | POA: Insufficient documentation

## 2013-05-25 DIAGNOSIS — IMO0002 Reserved for concepts with insufficient information to code with codable children: Secondary | ICD-10-CM

## 2013-05-25 DIAGNOSIS — F17200 Nicotine dependence, unspecified, uncomplicated: Secondary | ICD-10-CM | POA: Insufficient documentation

## 2013-05-25 DIAGNOSIS — S069XAS Unspecified intracranial injury with loss of consciousness status unknown, sequela: Secondary | ICD-10-CM | POA: Insufficient documentation

## 2013-05-25 DIAGNOSIS — M79609 Pain in unspecified limb: Secondary | ICD-10-CM | POA: Insufficient documentation

## 2013-05-25 DIAGNOSIS — S346XXS Injury of peripheral nerve(s) at abdomen, lower back and pelvis level, sequela: Secondary | ICD-10-CM | POA: Insufficient documentation

## 2013-05-25 DIAGNOSIS — G4730 Sleep apnea, unspecified: Secondary | ICD-10-CM | POA: Insufficient documentation

## 2013-05-25 DIAGNOSIS — F29 Unspecified psychosis not due to a substance or known physiological condition: Secondary | ICD-10-CM | POA: Insufficient documentation

## 2013-05-25 DIAGNOSIS — E785 Hyperlipidemia, unspecified: Secondary | ICD-10-CM | POA: Insufficient documentation

## 2013-05-25 DIAGNOSIS — S8290XS Unspecified fracture of unspecified lower leg, sequela: Secondary | ICD-10-CM | POA: Insufficient documentation

## 2013-05-25 DIAGNOSIS — S8490XS Injury of unspecified nerve at lower leg level, unspecified leg, sequela: Secondary | ICD-10-CM | POA: Insufficient documentation

## 2013-05-25 DIAGNOSIS — R413 Other amnesia: Secondary | ICD-10-CM | POA: Insufficient documentation

## 2013-05-25 DIAGNOSIS — M19179 Post-traumatic osteoarthritis, unspecified ankle and foot: Secondary | ICD-10-CM

## 2013-05-25 NOTE — Patient Instructions (Signed)
Follow up next month with Dr Naaman Plummer 06/20/13

## 2013-05-25 NOTE — Progress Notes (Signed)
Here for pill count and medication refills. She did not bring her bottles today.  She was keeping her 4 month granddaughter this morning and it through her routine off and she forgot her bottles.  She does not need the oxy ir 10 mg this month.  I had printed and signed both rx but voided the oxy 10 mg IR rx.  She takes it very infrequently and last month she had # 62 so she does not need refill on this.  She was given the refill on the OxyContin 20 mg #60.  She has not had any falls and was educated and given handout on fall prevention in the home last visit.  She has been given the packet on getting an Advanced Directive set up.  She has an appt scheduled for next month with Dr Naaman Plummer.

## 2013-06-08 ENCOUNTER — Other Ambulatory Visit: Payer: Self-pay | Admitting: Physical Medicine & Rehabilitation

## 2013-06-20 ENCOUNTER — Encounter: Payer: BC Managed Care – PPO | Admitting: Physical Medicine & Rehabilitation

## 2013-06-24 ENCOUNTER — Encounter: Payer: BC Managed Care – PPO | Attending: Registered Nurse | Admitting: Registered Nurse

## 2013-06-24 ENCOUNTER — Encounter: Payer: Self-pay | Admitting: Registered Nurse

## 2013-06-24 VITALS — BP 132/78 | HR 98 | Resp 14 | Ht 66.0 in | Wt 281.0 lb

## 2013-06-24 DIAGNOSIS — G894 Chronic pain syndrome: Secondary | ICD-10-CM

## 2013-06-24 DIAGNOSIS — R600 Localized edema: Secondary | ICD-10-CM

## 2013-06-24 DIAGNOSIS — T1490XA Injury, unspecified, initial encounter: Secondary | ICD-10-CM

## 2013-06-24 DIAGNOSIS — R609 Edema, unspecified: Secondary | ICD-10-CM | POA: Insufficient documentation

## 2013-06-24 DIAGNOSIS — Z79899 Other long term (current) drug therapy: Secondary | ICD-10-CM

## 2013-06-24 DIAGNOSIS — M12579 Traumatic arthropathy, unspecified ankle and foot: Secondary | ICD-10-CM

## 2013-06-24 DIAGNOSIS — S8410XA Injury of peroneal nerve at lower leg level, unspecified leg, initial encounter: Secondary | ICD-10-CM

## 2013-06-24 DIAGNOSIS — X58XXXS Exposure to other specified factors, sequela: Secondary | ICD-10-CM | POA: Insufficient documentation

## 2013-06-24 DIAGNOSIS — Z5181 Encounter for therapeutic drug level monitoring: Secondary | ICD-10-CM

## 2013-06-24 DIAGNOSIS — M19179 Post-traumatic osteoarthritis, unspecified ankle and foot: Secondary | ICD-10-CM

## 2013-06-24 MED ORDER — METHOCARBAMOL 500 MG PO TABS: 500.0000 mg | ORAL_TABLET | Freq: Three times a day (TID) | ORAL | Status: DC

## 2013-06-24 MED ORDER — OXYCODONE HCL ER 20 MG PO T12A: 20.0000 mg | EXTENDED_RELEASE_TABLET | Freq: Two times a day (BID) | ORAL | Status: DC
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
## 2013-06-24 NOTE — Addendum Note (Signed)
Addended by: Bayard Hugger on: 06/24/2013 12:23 PM   Modules accepted: Orders, Medications

## 2013-07-11 ENCOUNTER — Ambulatory Visit: Payer: BC Managed Care – PPO | Admitting: Neurology

## 2013-07-22 ENCOUNTER — Encounter: Payer: Self-pay | Admitting: Registered Nurse

## 2013-07-22 ENCOUNTER — Encounter: Payer: BC Managed Care – PPO | Attending: Registered Nurse | Admitting: Registered Nurse

## 2013-07-22 VITALS — BP 137/79 | HR 87 | Resp 14 | Wt 277.8 lb

## 2013-07-22 DIAGNOSIS — Z5181 Encounter for therapeutic drug level monitoring: Secondary | ICD-10-CM

## 2013-07-22 DIAGNOSIS — M19179 Post-traumatic osteoarthritis, unspecified ankle and foot: Secondary | ICD-10-CM

## 2013-07-22 DIAGNOSIS — Z79899 Other long term (current) drug therapy: Secondary | ICD-10-CM

## 2013-07-22 DIAGNOSIS — R600 Localized edema: Secondary | ICD-10-CM

## 2013-07-22 DIAGNOSIS — M12579 Traumatic arthropathy, unspecified ankle and foot: Secondary | ICD-10-CM

## 2013-07-22 DIAGNOSIS — X58XXXS Exposure to other specified factors, sequela: Secondary | ICD-10-CM | POA: Insufficient documentation

## 2013-07-22 DIAGNOSIS — R609 Edema, unspecified: Secondary | ICD-10-CM | POA: Insufficient documentation

## 2013-07-22 DIAGNOSIS — G894 Chronic pain syndrome: Secondary | ICD-10-CM

## 2013-07-22 DIAGNOSIS — T1490XA Injury, unspecified, initial encounter: Secondary | ICD-10-CM

## 2013-07-22 DIAGNOSIS — S8410XA Injury of peroneal nerve at lower leg level, unspecified leg, initial encounter: Secondary | ICD-10-CM

## 2013-07-22 MED ORDER — DICLOFENAC SODIUM 1 % TD GEL: 2.0000 g | Freq: Four times a day (QID) | TRANSDERMAL | Status: DC

## 2013-07-22 MED ORDER — OXYCODONE HCL ER 20 MG PO T12A: 20.0000 mg | EXTENDED_RELEASE_TABLET | Freq: Two times a day (BID) | ORAL | Status: DC
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
## 2013-08-23 ENCOUNTER — Encounter: Payer: BC Managed Care – PPO | Attending: Registered Nurse | Admitting: Physical Medicine & Rehabilitation

## 2013-08-23 ENCOUNTER — Encounter: Payer: Self-pay | Admitting: Physical Medicine & Rehabilitation

## 2013-08-23 VITALS — BP 157/75 | HR 74 | Resp 16 | Ht 66.0 in | Wt 277.0 lb

## 2013-08-23 DIAGNOSIS — G894 Chronic pain syndrome: Secondary | ICD-10-CM

## 2013-08-23 DIAGNOSIS — S8490XS Injury of unspecified nerve at lower leg level, unspecified leg, sequela: Secondary | ICD-10-CM

## 2013-08-23 DIAGNOSIS — S346XXS Injury of peripheral nerve(s) at abdomen, lower back and pelvis level, sequela: Secondary | ICD-10-CM

## 2013-08-23 DIAGNOSIS — R609 Edema, unspecified: Secondary | ICD-10-CM | POA: Insufficient documentation

## 2013-08-23 DIAGNOSIS — M12579 Traumatic arthropathy, unspecified ankle and foot: Secondary | ICD-10-CM

## 2013-08-23 DIAGNOSIS — X58XXXS Exposure to other specified factors, sequela: Secondary | ICD-10-CM | POA: Insufficient documentation

## 2013-08-23 DIAGNOSIS — IMO0002 Reserved for concepts with insufficient information to code with codable children: Secondary | ICD-10-CM

## 2013-08-23 DIAGNOSIS — T1490XA Injury, unspecified, initial encounter: Secondary | ICD-10-CM | POA: Insufficient documentation

## 2013-08-23 DIAGNOSIS — M19172 Post-traumatic osteoarthritis, left ankle and foot: Secondary | ICD-10-CM

## 2013-08-23 DIAGNOSIS — G5710 Meralgia paresthetica, unspecified lower limb: Secondary | ICD-10-CM

## 2013-08-23 DIAGNOSIS — S066X5A Traumatic subarachnoid hemorrhage with loss of consciousness greater than 24 hours with return to pre-existing conscious level, initial encounter: Secondary | ICD-10-CM

## 2013-08-23 DIAGNOSIS — R600 Localized edema: Secondary | ICD-10-CM

## 2013-08-23 DIAGNOSIS — S8412XS Injury of peroneal nerve at lower leg level, left leg, sequela: Secondary | ICD-10-CM

## 2013-08-23 MED ORDER — OXYCODONE HCL 10 MG PO TABS: 10.0000 mg | ORAL_TABLET | Freq: Four times a day (QID) | ORAL | Status: DC | PRN

## 2013-08-23 MED ORDER — GABAPENTIN 600 MG PO TABS: 600.0000 mg | ORAL_TABLET | Freq: Four times a day (QID) | ORAL | Status: DC

## 2013-08-23 MED ORDER — OXYCODONE HCL ER 20 MG PO T12A: 20.0000 mg | EXTENDED_RELEASE_TABLET | Freq: Two times a day (BID) | ORAL | Status: DC

## 2013-08-23 NOTE — Patient Instructions (Addendum)
GABAPENTIN: TAKE 600MG IN THE MORNING AND AFTERNOON AND 900-1200MG (1.5 TO 2 TABS) AT NIGHT  WORK ON DESENSITIZING YOUR THIGHS.
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
## 2013-08-31 ENCOUNTER — Other Ambulatory Visit: Payer: Self-pay | Admitting: Registered Nurse

## 2013-09-20 ENCOUNTER — Encounter: Payer: BC Managed Care – PPO | Attending: Registered Nurse | Admitting: Registered Nurse

## 2013-09-20 ENCOUNTER — Encounter: Payer: Self-pay | Admitting: Registered Nurse

## 2013-09-20 VITALS — BP 139/83 | HR 84 | Resp 16 | Ht 66.0 in | Wt 279.0 lb

## 2013-09-20 DIAGNOSIS — Z5181 Encounter for therapeutic drug level monitoring: Secondary | ICD-10-CM

## 2013-09-20 DIAGNOSIS — S346XXS Injury of peripheral nerve(s) at abdomen, lower back and pelvis level, sequela: Secondary | ICD-10-CM

## 2013-09-20 DIAGNOSIS — T1490XA Injury, unspecified, initial encounter: Secondary | ICD-10-CM | POA: Insufficient documentation

## 2013-09-20 DIAGNOSIS — S8412XS Injury of peroneal nerve at lower leg level, left leg, sequela: Secondary | ICD-10-CM

## 2013-09-20 DIAGNOSIS — M19172 Post-traumatic osteoarthritis, left ankle and foot: Secondary | ICD-10-CM

## 2013-09-20 DIAGNOSIS — G5710 Meralgia paresthetica, unspecified lower limb: Secondary | ICD-10-CM

## 2013-09-20 DIAGNOSIS — R609 Edema, unspecified: Secondary | ICD-10-CM | POA: Insufficient documentation

## 2013-09-20 DIAGNOSIS — S8490XS Injury of unspecified nerve at lower leg level, unspecified leg, sequela: Secondary | ICD-10-CM

## 2013-09-20 DIAGNOSIS — IMO0002 Reserved for concepts with insufficient information to code with codable children: Secondary | ICD-10-CM

## 2013-09-20 DIAGNOSIS — Z79899 Other long term (current) drug therapy: Secondary | ICD-10-CM

## 2013-09-20 DIAGNOSIS — M12579 Traumatic arthropathy, unspecified ankle and foot: Secondary | ICD-10-CM

## 2013-09-20 DIAGNOSIS — R600 Localized edema: Secondary | ICD-10-CM

## 2013-09-20 DIAGNOSIS — X58XXXS Exposure to other specified factors, sequela: Secondary | ICD-10-CM | POA: Insufficient documentation

## 2013-09-20 DIAGNOSIS — G894 Chronic pain syndrome: Secondary | ICD-10-CM

## 2013-09-20 MED ORDER — OXYCODONE HCL ER 20 MG PO T12A: 20.0000 mg | EXTENDED_RELEASE_TABLET | Freq: Two times a day (BID) | ORAL | Status: DC
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
## 2013-10-11 ENCOUNTER — Telehealth: Payer: Self-pay | Admitting: *Deleted

## 2013-10-11 NOTE — Telephone Encounter (Signed)
Note entered for documentation purposes.  A prior Josem Kaufmann was initiated for [name] for her Voltaren Gel .  It was denied by her insurance company for not fitting the diagnostic criteria of OA for elbow, wrist, hand, knee or ankle.

## 2013-10-21 ENCOUNTER — Ambulatory Visit
Admission: RE | Admit: 2013-10-21 | Discharge: 2013-10-21 | Disposition: A | Discharge status: Home / Self Care | Payer: BC Managed Care – PPO | Source: Ambulatory Visit | Attending: Registered Nurse | Admitting: Registered Nurse

## 2013-10-21 ENCOUNTER — Encounter: Payer: BC Managed Care – PPO | Attending: Registered Nurse | Admitting: Registered Nurse

## 2013-10-21 ENCOUNTER — Encounter: Payer: Self-pay | Admitting: Registered Nurse

## 2013-10-21 VITALS — BP 134/58 | HR 82 | Resp 16

## 2013-10-21 DIAGNOSIS — M12579 Traumatic arthropathy, unspecified ankle and foot: Secondary | ICD-10-CM

## 2013-10-21 DIAGNOSIS — T1490XA Injury, unspecified, initial encounter: Secondary | ICD-10-CM | POA: Diagnosis present

## 2013-10-21 DIAGNOSIS — R609 Edema, unspecified: Secondary | ICD-10-CM | POA: Diagnosis present

## 2013-10-21 DIAGNOSIS — G5710 Meralgia paresthetica, unspecified lower limb: Secondary | ICD-10-CM | POA: Diagnosis not present

## 2013-10-21 DIAGNOSIS — Z79899 Other long term (current) drug therapy: Secondary | ICD-10-CM

## 2013-10-21 DIAGNOSIS — M19172 Post-traumatic osteoarthritis, left ankle and foot: Secondary | ICD-10-CM

## 2013-10-21 DIAGNOSIS — S346XXS Injury of peripheral nerve(s) at abdomen, lower back and pelvis level, sequela: Secondary | ICD-10-CM

## 2013-10-21 DIAGNOSIS — S8412XS Injury of peroneal nerve at lower leg level, left leg, sequela: Secondary | ICD-10-CM

## 2013-10-21 DIAGNOSIS — S8490XS Injury of unspecified nerve at lower leg level, unspecified leg, sequela: Secondary | ICD-10-CM | POA: Diagnosis not present

## 2013-10-21 DIAGNOSIS — M1710 Unilateral primary osteoarthritis, unspecified knee: Secondary | ICD-10-CM

## 2013-10-21 DIAGNOSIS — M1711 Unilateral primary osteoarthritis, right knee: Secondary | ICD-10-CM

## 2013-10-21 DIAGNOSIS — G894 Chronic pain syndrome: Secondary | ICD-10-CM

## 2013-10-21 DIAGNOSIS — X58XXXS Exposure to other specified factors, sequela: Secondary | ICD-10-CM | POA: Diagnosis not present

## 2013-10-21 DIAGNOSIS — IMO0002 Reserved for concepts with insufficient information to code with codable children: Secondary | ICD-10-CM

## 2013-10-21 DIAGNOSIS — Z5181 Encounter for therapeutic drug level monitoring: Secondary | ICD-10-CM

## 2013-10-21 DIAGNOSIS — R600 Localized edema: Secondary | ICD-10-CM

## 2013-10-21 MED ORDER — OXYCODONE HCL ER 20 MG PO T12A: 20.0000 mg | EXTENDED_RELEASE_TABLET | Freq: Two times a day (BID) | ORAL | Status: DC

## 2013-10-21 MED ORDER — DICLOFENAC SODIUM 1 % TD GEL: 2.0000 g | Freq: Four times a day (QID) | TRANSDERMAL | Status: DC
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
## 2013-10-24 ENCOUNTER — Telehealth: Payer: Self-pay | Admitting: Registered Nurse

## 2013-10-24 NOTE — Telephone Encounter (Signed)
Spoke to Ms. Ambriz regarding her x-ray results

## 2013-10-28 ENCOUNTER — Other Ambulatory Visit: Payer: Self-pay | Admitting: Registered Nurse

## 2013-11-22 ENCOUNTER — Encounter: Payer: Medicare Other | Admitting: Registered Nurse

## 2013-11-30 ENCOUNTER — Telehealth: Payer: Self-pay | Admitting: *Deleted

## 2013-11-30 DIAGNOSIS — T1490XA Injury, unspecified, initial encounter: Secondary | ICD-10-CM

## 2013-11-30 DIAGNOSIS — R600 Localized edema: Secondary | ICD-10-CM

## 2013-11-30 MED ORDER — OXYCODONE HCL 10 MG PO TABS: 10.0000 mg | ORAL_TABLET | Freq: Four times a day (QID) | ORAL | Status: DC | PRN

## 2013-11-30 MED ORDER — OXYCODONE HCL ER 20 MG PO T12A: 20.0000 mg | EXTENDED_RELEASE_TABLET | Freq: Two times a day (BID) | ORAL | Status: DC

## 2013-11-30 NOTE — Telephone Encounter (Signed)
Patient called because out of OxyContin and oxycodone.  Has appt on 12/06/13.

## 2013-12-02 ENCOUNTER — Telehealth: Payer: Self-pay | Admitting: *Deleted

## 2013-12-02 NOTE — Telephone Encounter (Signed)
Patient called about her prescription.  It was done on Monday but we failed to call her back to let her know it was ready.  It is in the binder upfront, and it is not my hand witting.  I told the patient to be here before 4:30 today

## 2013-12-06 ENCOUNTER — Encounter: Payer: Self-pay | Admitting: Registered Nurse

## 2013-12-06 ENCOUNTER — Encounter: Payer: BC Managed Care – PPO | Attending: Registered Nurse | Admitting: Registered Nurse

## 2013-12-06 VITALS — BP 140/68 | HR 89 | Resp 14 | Ht 66.0 in | Wt 287.4 lb

## 2013-12-06 DIAGNOSIS — S8412XS Injury of peroneal nerve at lower leg level, left leg, sequela: Secondary | ICD-10-CM | POA: Diagnosis not present

## 2013-12-06 DIAGNOSIS — M1711 Unilateral primary osteoarthritis, right knee: Secondary | ICD-10-CM

## 2013-12-06 DIAGNOSIS — Z79899 Other long term (current) drug therapy: Secondary | ICD-10-CM | POA: Insufficient documentation

## 2013-12-06 DIAGNOSIS — G894 Chronic pain syndrome: Secondary | ICD-10-CM | POA: Insufficient documentation

## 2013-12-06 DIAGNOSIS — M179 Osteoarthritis of knee, unspecified: Secondary | ICD-10-CM

## 2013-12-06 DIAGNOSIS — Z5181 Encounter for therapeutic drug level monitoring: Secondary | ICD-10-CM | POA: Diagnosis present
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
## 2013-12-08 ENCOUNTER — Other Ambulatory Visit: Payer: Self-pay | Admitting: *Deleted

## 2013-12-08 MED ORDER — DICLOFENAC SODIUM 1 % TD GEL: 2.0000 g | Freq: Four times a day (QID) | TRANSDERMAL | Status: DC

## 2013-12-27 ENCOUNTER — Other Ambulatory Visit: Payer: Self-pay | Admitting: Registered Nurse

## 2014-01-02 ENCOUNTER — Encounter: Payer: Self-pay | Admitting: Registered Nurse

## 2014-01-02 ENCOUNTER — Encounter: Payer: BC Managed Care – PPO | Attending: Registered Nurse | Admitting: Registered Nurse

## 2014-01-02 VITALS — HR 83 | Resp 14 | Ht 66.0 in | Wt 260.0 lb

## 2014-01-02 DIAGNOSIS — T1490XA Injury, unspecified, initial encounter: Secondary | ICD-10-CM

## 2014-01-02 DIAGNOSIS — R600 Localized edema: Secondary | ICD-10-CM

## 2014-01-02 DIAGNOSIS — G894 Chronic pain syndrome: Secondary | ICD-10-CM | POA: Diagnosis present

## 2014-01-02 DIAGNOSIS — M179 Osteoarthritis of knee, unspecified: Secondary | ICD-10-CM | POA: Diagnosis not present

## 2014-01-02 DIAGNOSIS — S8412XS Injury of peroneal nerve at lower leg level, left leg, sequela: Secondary | ICD-10-CM | POA: Diagnosis not present

## 2014-01-02 DIAGNOSIS — Z5181 Encounter for therapeutic drug level monitoring: Secondary | ICD-10-CM

## 2014-01-02 DIAGNOSIS — T1490 Injury, unspecified: Secondary | ICD-10-CM

## 2014-01-02 DIAGNOSIS — Z79899 Other long term (current) drug therapy: Secondary | ICD-10-CM | POA: Diagnosis present

## 2014-01-02 DIAGNOSIS — M1711 Unilateral primary osteoarthritis, right knee: Secondary | ICD-10-CM

## 2014-01-02 MED ORDER — OXYCODONE HCL ER 20 MG PO T12A: 20.0000 mg | EXTENDED_RELEASE_TABLET | Freq: Two times a day (BID) | ORAL | Status: DC

## 2014-01-02 MED ORDER — OXYCODONE HCL 10 MG PO TABS: 10.0000 mg | ORAL_TABLET | Freq: Four times a day (QID) | ORAL | Status: DC | PRN
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
## 2014-01-07 ENCOUNTER — Other Ambulatory Visit: Payer: Self-pay | Admitting: Physical Medicine & Rehabilitation

## 2014-01-31 ENCOUNTER — Encounter: Payer: Self-pay | Admitting: Registered Nurse

## 2014-01-31 ENCOUNTER — Encounter: Payer: BC Managed Care – PPO | Attending: Registered Nurse | Admitting: Registered Nurse

## 2014-01-31 VITALS — BP 136/63 | HR 71 | Resp 14 | Wt 275.4 lb

## 2014-01-31 DIAGNOSIS — R600 Localized edema: Secondary | ICD-10-CM

## 2014-01-31 DIAGNOSIS — S8412XS Injury of peroneal nerve at lower leg level, left leg, sequela: Secondary | ICD-10-CM | POA: Diagnosis not present

## 2014-01-31 DIAGNOSIS — G894 Chronic pain syndrome: Secondary | ICD-10-CM | POA: Diagnosis not present

## 2014-01-31 DIAGNOSIS — Z5181 Encounter for therapeutic drug level monitoring: Secondary | ICD-10-CM | POA: Diagnosis present

## 2014-01-31 DIAGNOSIS — M1711 Unilateral primary osteoarthritis, right knee: Secondary | ICD-10-CM

## 2014-01-31 DIAGNOSIS — Z79899 Other long term (current) drug therapy: Secondary | ICD-10-CM | POA: Insufficient documentation

## 2014-01-31 DIAGNOSIS — M179 Osteoarthritis of knee, unspecified: Secondary | ICD-10-CM | POA: Diagnosis not present

## 2014-01-31 DIAGNOSIS — T1490 Injury, unspecified: Secondary | ICD-10-CM

## 2014-01-31 DIAGNOSIS — M7541 Impingement syndrome of right shoulder: Secondary | ICD-10-CM

## 2014-01-31 DIAGNOSIS — T1490XA Injury, unspecified, initial encounter: Secondary | ICD-10-CM

## 2014-01-31 MED ORDER — OXYCODONE HCL 10 MG PO TABS: 10.0000 mg | ORAL_TABLET | Freq: Four times a day (QID) | ORAL | Status: DC | PRN

## 2014-01-31 MED ORDER — OXYCODONE HCL ER 20 MG PO T12A: 20.0000 mg | EXTENDED_RELEASE_TABLET | Freq: Two times a day (BID) | ORAL | Status: DC
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
## 2014-02-26 ENCOUNTER — Other Ambulatory Visit: Payer: Self-pay | Admitting: Registered Nurse

## 2014-02-27 ENCOUNTER — Encounter (HOSPITAL_BASED_OUTPATIENT_CLINIC_OR_DEPARTMENT_OTHER): Payer: BC Managed Care – PPO | Admitting: Registered Nurse

## 2014-02-27 ENCOUNTER — Encounter: Payer: Self-pay | Admitting: Registered Nurse

## 2014-02-27 VITALS — BP 90/62 | HR 84 | Resp 14

## 2014-02-27 DIAGNOSIS — T1490XA Injury, unspecified, initial encounter: Secondary | ICD-10-CM

## 2014-02-27 DIAGNOSIS — G894 Chronic pain syndrome: Secondary | ICD-10-CM | POA: Diagnosis not present

## 2014-02-27 DIAGNOSIS — M179 Osteoarthritis of knee, unspecified: Secondary | ICD-10-CM | POA: Diagnosis not present

## 2014-02-27 DIAGNOSIS — M1711 Unilateral primary osteoarthritis, right knee: Secondary | ICD-10-CM

## 2014-02-27 DIAGNOSIS — M7541 Impingement syndrome of right shoulder: Secondary | ICD-10-CM

## 2014-02-27 DIAGNOSIS — T1490 Injury, unspecified: Secondary | ICD-10-CM

## 2014-02-27 DIAGNOSIS — S8412XS Injury of peroneal nerve at lower leg level, left leg, sequela: Secondary | ICD-10-CM

## 2014-02-27 DIAGNOSIS — R600 Localized edema: Secondary | ICD-10-CM

## 2014-02-27 DIAGNOSIS — Z5181 Encounter for therapeutic drug level monitoring: Secondary | ICD-10-CM

## 2014-02-27 DIAGNOSIS — Z79899 Other long term (current) drug therapy: Secondary | ICD-10-CM

## 2014-02-27 MED ORDER — OXYCODONE HCL ER 20 MG PO T12A: 20.0000 mg | EXTENDED_RELEASE_TABLET | Freq: Two times a day (BID) | ORAL | Status: DC
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
## 2014-04-03 ENCOUNTER — Other Ambulatory Visit: Payer: Self-pay | Admitting: Physical Medicine & Rehabilitation

## 2014-04-03 ENCOUNTER — Encounter: Payer: Medicare Other | Attending: Registered Nurse | Admitting: Registered Nurse

## 2014-04-03 ENCOUNTER — Encounter: Payer: Self-pay | Admitting: Registered Nurse

## 2014-04-03 VITALS — BP 113/79 | HR 73 | Resp 14

## 2014-04-03 DIAGNOSIS — M1711 Unilateral primary osteoarthritis, right knee: Secondary | ICD-10-CM

## 2014-04-03 DIAGNOSIS — T1490 Injury, unspecified: Secondary | ICD-10-CM

## 2014-04-03 DIAGNOSIS — G894 Chronic pain syndrome: Secondary | ICD-10-CM

## 2014-04-03 DIAGNOSIS — S93409A Sprain of unspecified ligament of unspecified ankle, initial encounter: Secondary | ICD-10-CM

## 2014-04-03 DIAGNOSIS — Z79899 Other long term (current) drug therapy: Secondary | ICD-10-CM | POA: Diagnosis present

## 2014-04-03 DIAGNOSIS — S8412XS Injury of peroneal nerve at lower leg level, left leg, sequela: Secondary | ICD-10-CM

## 2014-04-03 DIAGNOSIS — Z5181 Encounter for therapeutic drug level monitoring: Secondary | ICD-10-CM

## 2014-04-03 DIAGNOSIS — M7541 Impingement syndrome of right shoulder: Secondary | ICD-10-CM

## 2014-04-03 DIAGNOSIS — M179 Osteoarthritis of knee, unspecified: Secondary | ICD-10-CM

## 2014-04-03 DIAGNOSIS — T1490XA Injury, unspecified, initial encounter: Secondary | ICD-10-CM

## 2014-04-03 DIAGNOSIS — R600 Localized edema: Secondary | ICD-10-CM

## 2014-04-03 MED ORDER — OXYCODONE HCL ER 20 MG PO T12A: 20.0000 mg | EXTENDED_RELEASE_TABLET | Freq: Two times a day (BID) | ORAL | Status: DC
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
## 2014-04-04 LAB — PMP ALCOHOL METABOLITE (ETG): ETGU: NEGATIVE ng/mL

## 2014-04-07 LAB — BENZODIAZEPINES (GC/LC/MS), URINE
Alprazolam metabolite (GC/LC/MS), ur confirm: NEGATIVE ng/mL (ref <25)
Clonazepam metabolite (GC/LC/MS), ur confirm: 522 ng/mL — AB (ref <25)
Flurazepam metabolite (GC/LC/MS), ur confirm: NEGATIVE ng/mL (ref <50)
Lorazepam (GC/LC/MS), ur confirm: NEGATIVE ng/mL (ref <50)
Midazolam (GC/LC/MS), ur confirm: NEGATIVE ng/mL (ref <50)
NORDIAZEPAMU: NEGATIVE ng/mL (ref <50)
OXAZEPAMU: NEGATIVE ng/mL (ref <50)
TEMAZEPAMU: NEGATIVE ng/mL (ref <50)
Triazolam metabolite (GC/LC/MS), ur confirm: NEGATIVE ng/mL (ref <50)

## 2014-04-07 LAB — OPIATES/OPIOIDS (LC/MS-MS)
Codeine Urine: NEGATIVE ng/mL (ref <50)
HYDROCODONE: NEGATIVE ng/mL (ref <50)
Hydromorphone: NEGATIVE ng/mL (ref <50)
Morphine Urine: NEGATIVE ng/mL (ref <50)
NORHYDROCODONE, UR: NEGATIVE ng/mL (ref <50)
Noroxycodone, Ur: 16764 ng/mL (ref <50)
OXYMORPHONE, URINE: 11196 ng/mL (ref <50)
Oxycodone, ur: 9336 ng/mL (ref <50)

## 2014-04-07 LAB — OXYCODONE, URINE (LC/MS-MS)
Noroxycodone, Ur: 16764 ng/mL (ref <50)
Oxycodone, ur: 9336 ng/mL (ref <50)
Oxymorphone: 11196 ng/mL (ref <50)

## 2014-04-07 LAB — MEPERIDINE (GC/LC/MS), URINE
Meperidine (GC/LC/MS), ur confirm: NEGATIVE ng/mL (ref <100)
Normeperidine (GC/LC/MS), ur confirm: NEGATIVE ng/mL (ref <100)

## 2014-04-08 LAB — PRESCRIPTION MONITORING PROFILE (SOLSTAS)
Amphetamine/Meth: NEGATIVE ng/mL
BUPRENORPHINE, URINE: NEGATIVE ng/mL
Barbiturate Screen, Urine: NEGATIVE ng/mL
CANNABINOID SCRN UR: NEGATIVE ng/mL
CARISOPRODOL, URINE: NEGATIVE ng/mL
CREATININE, URINE: 433.36 mg/dL (ref >20.0)
Cocaine Metabolites: NEGATIVE ng/mL
Fentanyl, Ur: NEGATIVE ng/mL
MDMA URINE: NEGATIVE ng/mL
Methadone Screen, Urine: NEGATIVE ng/mL
NITRITES URINE, INITIAL: NEGATIVE ug/mL
PH URINE, INITIAL: 5.4 pH (ref 4.5–8.9)
Propoxyphene: NEGATIVE ng/mL
Tapentadol, urine: NEGATIVE ng/mL
Tramadol Scrn, Ur: NEGATIVE ng/mL
ZOLPIDEM, URINE: NEGATIVE ng/mL

## 2014-04-28 NOTE — Progress Notes (Signed)
Urine drug screen for this encounter is consistent for prescribed medication.  Has rx for clonazepam

## 2014-05-02 ENCOUNTER — Encounter: Payer: Medicare Other | Admitting: Registered Nurse

## 2014-05-08 ENCOUNTER — Encounter: Payer: Medicare Other | Attending: Registered Nurse | Admitting: Registered Nurse

## 2014-05-08 DIAGNOSIS — Z79899 Other long term (current) drug therapy: Secondary | ICD-10-CM | POA: Insufficient documentation

## 2014-05-08 DIAGNOSIS — G894 Chronic pain syndrome: Secondary | ICD-10-CM | POA: Insufficient documentation

## 2014-05-08 DIAGNOSIS — Z5181 Encounter for therapeutic drug level monitoring: Secondary | ICD-10-CM | POA: Insufficient documentation

## 2014-05-12 ENCOUNTER — Encounter (HOSPITAL_BASED_OUTPATIENT_CLINIC_OR_DEPARTMENT_OTHER): Payer: Medicare Other | Admitting: Registered Nurse

## 2014-05-12 ENCOUNTER — Encounter: Payer: Self-pay | Admitting: Registered Nurse

## 2014-05-12 ENCOUNTER — Telehealth: Payer: Self-pay | Admitting: Registered Nurse

## 2014-05-12 VITALS — BP 123/61 | HR 84 | Resp 14

## 2014-05-12 DIAGNOSIS — R4189 Other symptoms and signs involving cognitive functions and awareness: Secondary | ICD-10-CM

## 2014-05-12 DIAGNOSIS — R600 Localized edema: Secondary | ICD-10-CM

## 2014-05-12 DIAGNOSIS — M1711 Unilateral primary osteoarthritis, right knee: Secondary | ICD-10-CM

## 2014-05-12 DIAGNOSIS — M179 Osteoarthritis of knee, unspecified: Secondary | ICD-10-CM

## 2014-05-12 DIAGNOSIS — Z79899 Other long term (current) drug therapy: Secondary | ICD-10-CM | POA: Diagnosis present

## 2014-05-12 DIAGNOSIS — Z5181 Encounter for therapeutic drug level monitoring: Secondary | ICD-10-CM | POA: Diagnosis present

## 2014-05-12 DIAGNOSIS — G894 Chronic pain syndrome: Secondary | ICD-10-CM

## 2014-05-12 DIAGNOSIS — S8412XS Injury of peroneal nerve at lower leg level, left leg, sequela: Secondary | ICD-10-CM

## 2014-05-12 MED ORDER — OXYCODONE HCL 10 MG PO TABS: 10.0000 mg | ORAL_TABLET | Freq: Four times a day (QID) | ORAL | Status: DC | PRN

## 2014-05-12 MED ORDER — OXYCODONE HCL ER 20 MG PO T12A: 20.0000 mg | EXTENDED_RELEASE_TABLET | Freq: Two times a day (BID) | ORAL | Status: DC
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
## 2014-05-16 ENCOUNTER — Telehealth: Payer: Self-pay | Admitting: *Deleted

## 2014-05-16 NOTE — Telephone Encounter (Signed)
error 

## 2014-05-16 NOTE — Telephone Encounter (Signed)
Calling to speak to Katherine [name] about Katherine [name] medication.  Needs a prior auth on Katherine [name] pain medication. PA submitted via Cover My Meds for Katherine [name] oxycontin today Notified Katherine [name]

## 2014-05-17 ENCOUNTER — Telehealth: Payer: Self-pay | Admitting: *Deleted

## 2014-05-17 ENCOUNTER — Telehealth: Payer: Self-pay | Admitting: Registered Nurse

## 2014-05-17 MED ORDER — MORPHINE SULFATE ER 15 MG PO TBCR: 15.0000 mg | EXTENDED_RELEASE_TABLET | Freq: Two times a day (BID) | ORAL | Status: DC

## 2014-05-17 NOTE — Telephone Encounter (Signed)
I notified Katherine [name] of her denial by insurance for the oxycontin and that we would be writing rx for MS CONTIN 15 mg q 12 hrs.  I spoke with pharmacy and told them to destroy the oxycontin rx they were holding. She will pick up the new rx.

## 2014-05-17 NOTE — Telephone Encounter (Signed)
[name] was denied , a prior authorization was performed and she still was denied. We will change her to MS Contin 15 mg one tablet every 12 hours. This has been discussed with Dr. Naaman Plummer and agrees with the plan. [name] was notified. She will pick up the prescription the pharmacy was called and instructed to discard the oxycontin prescription.

## 2014-06-02 ENCOUNTER — Ambulatory Visit: Payer: Medicare Other | Admitting: Registered Nurse

## 2014-06-02 ENCOUNTER — Ambulatory Visit: Payer: Medicare Other | Admitting: Physical Medicine & Rehabilitation

## 2014-06-06 ENCOUNTER — Telehealth: Payer: Self-pay | Admitting: *Deleted

## 2014-06-06 NOTE — Telephone Encounter (Signed)
Have her contact and go back to the orthotist, (?[facility]) and then [facility] can send me the formal script for the replacement.

## 2014-06-06 NOTE — Telephone Encounter (Signed)
Katherine [name] is calling because she needs an Rx for the plate that goes in her shoe.  Her brace is broken.  I sent this to you as opposed to [facility] since I was uncertain she would know what this is.  Do you?

## 2014-06-07 NOTE — Telephone Encounter (Signed)
Notified [name].  She got it from [facility].

## 2014-06-08 ENCOUNTER — Telehealth: Payer: Self-pay | Admitting: *Deleted

## 2014-06-08 DIAGNOSIS — G5730 Lesion of lateral popliteal nerve, unspecified lower limb: Secondary | ICD-10-CM

## 2014-06-08 NOTE — Telephone Encounter (Signed)
Ercel called back and she got her brace from [facility].  They told her that they do not send orders or call orders to MD offices that we have to send an order to them. She is needing the caliper plate put back in her shoe.

## 2014-06-09 NOTE — Telephone Encounter (Signed)
Order written

## 2014-06-09 NOTE — Telephone Encounter (Signed)
Orders faxed to [facility] notified

## 2014-06-13 ENCOUNTER — Encounter: Payer: Self-pay | Admitting: Registered Nurse

## 2014-06-13 ENCOUNTER — Encounter: Payer: Medicare Other | Attending: Registered Nurse | Admitting: Registered Nurse

## 2014-06-13 VITALS — BP 136/72 | HR 70 | Resp 14

## 2014-06-13 DIAGNOSIS — M1711 Unilateral primary osteoarthritis, right knee: Secondary | ICD-10-CM

## 2014-06-13 DIAGNOSIS — R600 Localized edema: Secondary | ICD-10-CM | POA: Diagnosis not present

## 2014-06-13 DIAGNOSIS — Z79899 Other long term (current) drug therapy: Secondary | ICD-10-CM | POA: Diagnosis present

## 2014-06-13 DIAGNOSIS — Z5181 Encounter for therapeutic drug level monitoring: Secondary | ICD-10-CM | POA: Insufficient documentation

## 2014-06-13 DIAGNOSIS — G894 Chronic pain syndrome: Secondary | ICD-10-CM | POA: Insufficient documentation

## 2014-06-13 DIAGNOSIS — R4189 Other symptoms and signs involving cognitive functions and awareness: Secondary | ICD-10-CM | POA: Diagnosis not present

## 2014-06-13 DIAGNOSIS — M179 Osteoarthritis of knee, unspecified: Secondary | ICD-10-CM

## 2014-06-13 DIAGNOSIS — G5730 Lesion of lateral popliteal nerve, unspecified lower limb: Secondary | ICD-10-CM

## 2014-06-13 DIAGNOSIS — S8412XS Injury of peroneal nerve at lower leg level, left leg, sequela: Secondary | ICD-10-CM

## 2014-06-13 MED ORDER — OXYCODONE HCL 10 MG PO TABS: 10.0000 mg | ORAL_TABLET | Freq: Three times a day (TID) | ORAL | Status: DC | PRN

## 2014-06-13 MED ORDER — MORPHINE SULFATE ER 30 MG PO TBCR: 30.0000 mg | EXTENDED_RELEASE_TABLET | Freq: Two times a day (BID) | ORAL | Status: DC

## 2014-06-13 NOTE — Patient Instructions (Signed)
Start the 30 mg Morphine on Friday and over the weekend. Call office on Monday  [phone]
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
## 2014-06-20 ENCOUNTER — Telehealth: Payer: Self-pay | Admitting: *Deleted

## 2014-06-20 ENCOUNTER — Telehealth: Payer: Self-pay | Admitting: Registered Nurse

## 2014-06-20 NOTE — Telephone Encounter (Signed)
Called and left message that the new medication which we RX'd is too strong.  Please call her back

## 2014-06-20 NOTE — Telephone Encounter (Signed)
Attempted to return call Katherine [name] call, no answer left message to return my call.

## 2014-06-20 NOTE — Telephone Encounter (Signed)
Return Ms. Delima call no answer left message to call office

## 2014-06-21 ENCOUNTER — Telehealth: Payer: Self-pay | Admitting: Registered Nurse

## 2014-06-21 ENCOUNTER — Other Ambulatory Visit: Payer: Self-pay | Admitting: Neurosurgery

## 2014-06-21 DIAGNOSIS — M5416 Radiculopathy, lumbar region: Secondary | ICD-10-CM

## 2014-06-21 DIAGNOSIS — R4701 Aphasia: Secondary | ICD-10-CM

## 2014-06-21 NOTE — Telephone Encounter (Signed)
Attempted to call Ms. Geer left message for her to return the call.

## 2014-06-21 NOTE — Telephone Encounter (Signed)
I spoke with Katherine [name] regarding her medication. I spoke with Dr. Naaman Plummer he's in agreement with MS Contin 15 mg in the morning and 30 mg in the evening.  Katherine [name] will call the office on Monday 06/26/14 with her decision. Instructed to call if she has any more questions or concerns she verbalizes understanding.

## 2014-06-27 ENCOUNTER — Telehealth: Payer: Self-pay | Admitting: *Deleted

## 2014-06-27 NOTE — Telephone Encounter (Signed)
Spoke with Katherine [name] she started (06/26/14) taking her Morphine at [facility] due to lethargy. The office staff will start the application process for prior approval with the insurance company for oxycontin. She verbalizes understanding. Initially we started her on MS Contin 15 mg BID she was having increased intensity of pain. After speaking with Dr. Naaman Plummer her MS Contin was increased to 30 mg every 12 hours. Due to her adverse effects we will await on her insurance company decision. She verbalizes understanding.

## 2014-06-27 NOTE — Telephone Encounter (Signed)
Please call.  Katherine [name] saw Dr Brigitte Pulse wants her taken off Morphine Sulfate because whe she was seen by MD she was suffering from delirium.  Her sister Katherine [name] called and wanted a call back from [place].

## 2014-06-27 NOTE — Telephone Encounter (Signed)
I spoke to Katherine [name] sister regarding Katherine [name] delerium. She states" the PCP feels  Katherine [name] is overmedicated.Our office will see what other medication   Her insurance will cover as well as trying to do a prior authorization for oxycontin. I called  Katherine [name] as well waiting on a call back.

## 2014-06-27 NOTE — Telephone Encounter (Signed)
Pt called for Katherine [name] asking for a call back...Marland Kitchenno reason specified

## 2014-06-28 ENCOUNTER — Telehealth: Payer: Self-pay | Admitting: *Deleted

## 2014-06-29 NOTE — Telephone Encounter (Signed)
Sent urgent appeal to medicare part D - faxed. Recd conformation.  Waiting approval

## 2014-06-30 NOTE — Telephone Encounter (Signed)
Shinora from [facility] called to notify that Ramla Martire's appeal has been approved.  Any questions call her back at [phone]

## 2014-07-03 ENCOUNTER — Ambulatory Visit: Payer: Medicare Other | Admitting: Physical Medicine & Rehabilitation

## 2014-07-03 ENCOUNTER — Telehealth: Payer: Self-pay | Admitting: *Deleted

## 2014-07-03 MED ORDER — OXYCODONE HCL ER 20 MG PO T12A: 20.0000 mg | EXTENDED_RELEASE_TABLET | Freq: Two times a day (BID) | ORAL | Status: DC

## 2014-07-03 NOTE — Telephone Encounter (Signed)
Called patient and told her that her old meds were approved and she needed to bring the morphine in to have it destroyed.  Printing out RX for the OxyContin 20 mg tablet q12h

## 2014-07-03 NOTE — Telephone Encounter (Signed)
Katherine [name] received word that her oxycontin was approved.  I left note for [name] and this has been addressed.

## 2014-07-05 ENCOUNTER — Encounter: Payer: Self-pay | Admitting: *Deleted

## 2014-07-05 NOTE — Progress Notes (Signed)
Katherine [name] is here today to return her Morphine Sulfate and have it destroyed after reaction and receive new rx for her oxycontin which has been approved by her insurance after her trial of morphine sulfate ER.  See destroyed medication sheet that will be scanned into media file.

## 2014-07-11 ENCOUNTER — Ambulatory Visit
Admission: RE | Admit: 2014-07-11 | Discharge: 2014-07-11 | Disposition: A | Discharge status: Home / Self Care | Payer: Medicare Other | Source: Ambulatory Visit | Attending: Neurosurgery | Admitting: Neurosurgery

## 2014-07-11 ENCOUNTER — Encounter: Payer: Self-pay | Admitting: Physical Medicine & Rehabilitation

## 2014-07-11 ENCOUNTER — Other Ambulatory Visit: Payer: Self-pay | Admitting: Physical Medicine & Rehabilitation

## 2014-07-11 ENCOUNTER — Encounter: Payer: Medicare Other | Attending: Registered Nurse | Admitting: Physical Medicine & Rehabilitation

## 2014-07-11 VITALS — BP 124/80 | HR 68 | Resp 14

## 2014-07-11 DIAGNOSIS — S066X9S Traumatic subarachnoid hemorrhage with loss of consciousness of unspecified duration, sequela: Secondary | ICD-10-CM

## 2014-07-11 DIAGNOSIS — R4701 Aphasia: Secondary | ICD-10-CM

## 2014-07-11 DIAGNOSIS — Z5181 Encounter for therapeutic drug level monitoring: Secondary | ICD-10-CM | POA: Insufficient documentation

## 2014-07-11 DIAGNOSIS — M5416 Radiculopathy, lumbar region: Secondary | ICD-10-CM

## 2014-07-11 DIAGNOSIS — G894 Chronic pain syndrome: Secondary | ICD-10-CM | POA: Diagnosis not present

## 2014-07-11 DIAGNOSIS — Z79899 Other long term (current) drug therapy: Secondary | ICD-10-CM | POA: Diagnosis present

## 2014-07-11 DIAGNOSIS — S8412XS Injury of peroneal nerve at lower leg level, left leg, sequela: Secondary | ICD-10-CM

## 2014-07-11 MED ORDER — OXYCODONE HCL 10 MG PO TABS: 10.0000 mg | ORAL_TABLET | Freq: Three times a day (TID) | ORAL | Status: DC | PRN

## 2014-07-11 MED ORDER — OXYCODONE HCL ER 10 MG PO T12A: 10.0000 mg | EXTENDED_RELEASE_TABLET | Freq: Two times a day (BID) | ORAL | Status: DC

## 2014-07-11 NOTE — Patient Instructions (Addendum)
PLEASE CALL ME WITH ANY PROBLEMS OR QUESTIONS (#[phone]).      
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
## 2014-07-17 ENCOUNTER — Telehealth: Payer: Self-pay | Admitting: *Deleted

## 2014-07-17 NOTE — Telephone Encounter (Signed)
Calling to say her Voltaren was denied and wondering if we have received paperwork from [facility] on it.

## 2014-07-18 NOTE — Telephone Encounter (Signed)
Paperwork has been received and review in process.

## 2014-07-21 NOTE — Telephone Encounter (Signed)
Both Lidoderm patches and Voltaren Gel has been denied by insurance.

## 2014-08-01 ENCOUNTER — Telehealth: Payer: Self-pay | Admitting: *Deleted

## 2014-08-01 NOTE — Telephone Encounter (Signed)
Recd denial for Lidoderm patches for this patient because there is not diagnosis of Diabetic Neuropathy.  Medicare will only approve patches with the follow diagnoses:  E11.40 Diabetic Neuropathy or  Post-herpetic neuralgia G44.847

## 2014-08-08 ENCOUNTER — Other Ambulatory Visit: Payer: Self-pay | Admitting: Registered Nurse

## 2014-08-08 ENCOUNTER — Encounter: Payer: Medicare Other | Attending: Registered Nurse | Admitting: Registered Nurse

## 2014-08-08 ENCOUNTER — Encounter: Payer: Self-pay | Admitting: Registered Nurse

## 2014-08-08 VITALS — BP 150/88 | HR 79 | Resp 16

## 2014-08-08 DIAGNOSIS — M179 Osteoarthritis of knee, unspecified: Secondary | ICD-10-CM

## 2014-08-08 DIAGNOSIS — Z79899 Other long term (current) drug therapy: Secondary | ICD-10-CM | POA: Diagnosis not present

## 2014-08-08 DIAGNOSIS — G5730 Lesion of lateral popliteal nerve, unspecified lower limb: Secondary | ICD-10-CM | POA: Diagnosis not present

## 2014-08-08 DIAGNOSIS — G894 Chronic pain syndrome: Secondary | ICD-10-CM

## 2014-08-08 DIAGNOSIS — S8412XS Injury of peroneal nerve at lower leg level, left leg, sequela: Secondary | ICD-10-CM

## 2014-08-08 DIAGNOSIS — Z5181 Encounter for therapeutic drug level monitoring: Secondary | ICD-10-CM

## 2014-08-08 DIAGNOSIS — M1711 Unilateral primary osteoarthritis, right knee: Secondary | ICD-10-CM

## 2014-08-08 MED ORDER — DICLOFENAC SODIUM 1 % TD GEL: 2.0000 g | Freq: Four times a day (QID) | TRANSDERMAL | Status: DC

## 2014-08-08 MED ORDER — OXYCODONE HCL 10 MG PO TABS: 10.0000 mg | ORAL_TABLET | Freq: Three times a day (TID) | ORAL | Status: DC | PRN

## 2014-08-08 MED ORDER — OXYCODONE HCL ER 10 MG PO T12A: 10.0000 mg | EXTENDED_RELEASE_TABLET | Freq: Two times a day (BID) | ORAL | Status: DC
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
## 2014-08-09 LAB — PMP ALCOHOL METABOLITE (ETG): Ethyl Glucuronide (EtG): NEGATIVE ng/mL

## 2014-08-12 LAB — OXYCODONE, URINE (LC/MS-MS)
Noroxycodone, Ur: 9514 ng/mL (ref <50)
Oxycodone, ur: 3266 ng/mL (ref <50)
Oxymorphone: 3079 ng/mL (ref <50)

## 2014-08-12 LAB — OPIATES/OPIOIDS (LC/MS-MS)
Codeine Urine: NEGATIVE ng/mL (ref <50)
Hydrocodone: NEGATIVE ng/mL (ref <50)
Hydromorphone: NEGATIVE ng/mL — AB (ref <50)
MORPHINE: NEGATIVE ng/mL — AB (ref <50)
NOROXYCODONE, UR: 9514 ng/mL (ref <50)
Norhydrocodone, Ur: NEGATIVE ng/mL (ref <50)
OXYCODONE, UR: 3266 ng/mL (ref <50)
OXYMORPHONE, URINE: 3079 ng/mL (ref <50)

## 2014-08-12 LAB — BENZODIAZEPINES (GC/LC/MS), URINE
ALPRAZOLAMU: NEGATIVE ng/mL (ref <25)
Clonazepam metabolite (GC/LC/MS), ur confirm: 181 ng/mL — AB (ref <25)
FLURAZEPAMU: NEGATIVE ng/mL (ref <50)
Lorazepam (GC/LC/MS), ur confirm: NEGATIVE ng/mL (ref <50)
MIDAZOLAMU: NEGATIVE ng/mL (ref <50)
Nordiazepam (GC/LC/MS), ur confirm: NEGATIVE ng/mL (ref <50)
Oxazepam (GC/LC/MS), ur confirm: NEGATIVE ng/mL (ref <50)
TEMAZEPAMU: NEGATIVE ng/mL (ref <50)
Triazolam metabolite (GC/LC/MS), ur confirm: NEGATIVE ng/mL (ref <50)

## 2014-08-15 LAB — PRESCRIPTION MONITORING PROFILE (SOLSTAS)
AMPHETAMINE/METH: NEGATIVE ng/mL
Barbiturate Screen, Urine: NEGATIVE ng/mL
Buprenorphine, Urine: NEGATIVE ng/mL
CANNABINOID SCRN UR: NEGATIVE ng/mL
CREATININE, URINE: 133.93 mg/dL (ref >20.0)
Carisoprodol, Urine: NEGATIVE ng/mL
Cocaine Metabolites: NEGATIVE ng/mL
ECSTASY: NEGATIVE ng/mL
Fentanyl, Ur: NEGATIVE ng/mL
MEPERIDINE UR: NEGATIVE ng/mL
METHADONE SCREEN, URINE: NEGATIVE ng/mL
Nitrites, Initial: NEGATIVE ug/mL
Propoxyphene: NEGATIVE ng/mL
TAPENTADOLUR: NEGATIVE ng/mL
Tramadol Scrn, Ur: NEGATIVE ng/mL
Zolpidem, Urine: NEGATIVE ng/mL
pH, Initial: 6 pH (ref 4.5–8.9)

## 2014-08-16 NOTE — Progress Notes (Signed)
Urine drug screen for this encounter is consistent for prescribed medication 

## 2014-08-17 ENCOUNTER — Telehealth: Payer: Self-pay | Admitting: *Deleted

## 2014-08-17 NOTE — Telephone Encounter (Signed)
Pt called for update on Prior auth for diclofenac sodium gel (voltaren generic).

## 2014-08-17 NOTE — Telephone Encounter (Signed)
Called pt and informed that prior auth for generic voltaren should be submitted sometime today or early tomorrow

## 2014-08-30 ENCOUNTER — Other Ambulatory Visit: Payer: Self-pay | Admitting: Registered Nurse

## 2014-09-12 ENCOUNTER — Telehealth: Payer: Self-pay | Admitting: *Deleted

## 2014-09-12 NOTE — Telephone Encounter (Signed)
Diclofenac Sodium (Voltaren) has been approved from 08/17/2014 thru 08/16/2015 GPI/NDC: [id number]

## 2014-09-19 ENCOUNTER — Encounter: Payer: Medicare Other | Admitting: Registered Nurse

## 2014-09-25 ENCOUNTER — Encounter: Payer: Self-pay | Admitting: Registered Nurse

## 2014-09-25 ENCOUNTER — Encounter: Payer: Medicare Other | Attending: Registered Nurse | Admitting: Registered Nurse

## 2014-09-25 VITALS — BP 138/90 | HR 88 | Resp 14

## 2014-09-25 DIAGNOSIS — S8412XS Injury of peroneal nerve at lower leg level, left leg, sequela: Secondary | ICD-10-CM | POA: Diagnosis not present

## 2014-09-25 DIAGNOSIS — G5730 Lesion of lateral popliteal nerve, unspecified lower limb: Secondary | ICD-10-CM

## 2014-09-25 DIAGNOSIS — Z5181 Encounter for therapeutic drug level monitoring: Secondary | ICD-10-CM | POA: Diagnosis present

## 2014-09-25 DIAGNOSIS — Z79899 Other long term (current) drug therapy: Secondary | ICD-10-CM | POA: Insufficient documentation

## 2014-09-25 DIAGNOSIS — G894 Chronic pain syndrome: Secondary | ICD-10-CM | POA: Insufficient documentation

## 2014-09-25 MED ORDER — OXYCODONE HCL 10 MG PO TABS: 10.0000 mg | ORAL_TABLET | Freq: Three times a day (TID) | ORAL | Status: DC | PRN

## 2014-09-25 MED ORDER — OXYCODONE HCL ER 20 MG PO T12A: 20.0000 mg | EXTENDED_RELEASE_TABLET | Freq: Two times a day (BID) | ORAL | Status: DC
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
## 2014-10-23 ENCOUNTER — Encounter: Payer: Self-pay | Admitting: Registered Nurse

## 2014-10-23 ENCOUNTER — Encounter: Payer: Medicare Other | Attending: Registered Nurse | Admitting: Registered Nurse

## 2014-10-23 VITALS — BP 129/70 | HR 88

## 2014-10-23 DIAGNOSIS — Z5181 Encounter for therapeutic drug level monitoring: Secondary | ICD-10-CM

## 2014-10-23 DIAGNOSIS — Z79899 Other long term (current) drug therapy: Secondary | ICD-10-CM | POA: Diagnosis present

## 2014-10-23 DIAGNOSIS — S8412XS Injury of peroneal nerve at lower leg level, left leg, sequela: Secondary | ICD-10-CM

## 2014-10-23 DIAGNOSIS — G5730 Lesion of lateral popliteal nerve, unspecified lower limb: Secondary | ICD-10-CM | POA: Diagnosis not present

## 2014-10-23 DIAGNOSIS — G894 Chronic pain syndrome: Secondary | ICD-10-CM | POA: Diagnosis not present

## 2014-10-23 MED ORDER — OXYCODONE HCL 10 MG PO TABS: 10.0000 mg | ORAL_TABLET | Freq: Three times a day (TID) | ORAL | Status: DC | PRN

## 2014-10-23 MED ORDER — OXYCODONE HCL ER 20 MG PO T12A: 20.0000 mg | EXTENDED_RELEASE_TABLET | Freq: Two times a day (BID) | ORAL | Status: DC
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
## 2014-11-02 ENCOUNTER — Other Ambulatory Visit: Payer: Self-pay | Admitting: Registered Nurse

## 2014-11-21 ENCOUNTER — Emergency Department (HOSPITAL_BASED_OUTPATIENT_CLINIC_OR_DEPARTMENT_OTHER): Payer: Medicare Other

## 2014-11-21 ENCOUNTER — Encounter (HOSPITAL_BASED_OUTPATIENT_CLINIC_OR_DEPARTMENT_OTHER): Payer: Self-pay | Admitting: Emergency Medicine

## 2014-11-21 ENCOUNTER — Other Ambulatory Visit: Payer: Self-pay | Admitting: Physical Medicine & Rehabilitation

## 2014-11-21 ENCOUNTER — Emergency Department (HOSPITAL_BASED_OUTPATIENT_CLINIC_OR_DEPARTMENT_OTHER)
Admission: EM | Admit: 2014-11-21 | Discharge: 2014-11-21 | Disposition: A | Discharge status: Home / Self Care | Payer: Medicare Other | Attending: Emergency Medicine | Admitting: Emergency Medicine

## 2014-11-21 DIAGNOSIS — Z794 Long term (current) use of insulin: Secondary | ICD-10-CM | POA: Insufficient documentation

## 2014-11-21 DIAGNOSIS — R112 Nausea with vomiting, unspecified: Secondary | ICD-10-CM | POA: Diagnosis present

## 2014-11-21 DIAGNOSIS — F419 Anxiety disorder, unspecified: Secondary | ICD-10-CM | POA: Insufficient documentation

## 2014-11-21 DIAGNOSIS — F329 Major depressive disorder, single episode, unspecified: Secondary | ICD-10-CM | POA: Insufficient documentation

## 2014-11-21 DIAGNOSIS — Z79899 Other long term (current) drug therapy: Secondary | ICD-10-CM | POA: Insufficient documentation

## 2014-11-21 DIAGNOSIS — Z87442 Personal history of urinary calculi: Secondary | ICD-10-CM | POA: Insufficient documentation

## 2014-11-21 DIAGNOSIS — R300 Dysuria: Secondary | ICD-10-CM | POA: Diagnosis not present

## 2014-11-21 DIAGNOSIS — Z87828 Personal history of other (healed) physical injury and trauma: Secondary | ICD-10-CM | POA: Insufficient documentation

## 2014-11-21 DIAGNOSIS — I10 Essential (primary) hypertension: Secondary | ICD-10-CM | POA: Diagnosis not present

## 2014-11-21 DIAGNOSIS — Z87891 Personal history of nicotine dependence: Secondary | ICD-10-CM | POA: Diagnosis not present

## 2014-11-21 DIAGNOSIS — Z792 Long term (current) use of antibiotics: Secondary | ICD-10-CM | POA: Diagnosis not present

## 2014-11-21 DIAGNOSIS — G43909 Migraine, unspecified, not intractable, without status migrainosus: Secondary | ICD-10-CM | POA: Insufficient documentation

## 2014-11-21 DIAGNOSIS — R197 Diarrhea, unspecified: Secondary | ICD-10-CM | POA: Insufficient documentation

## 2014-11-21 DIAGNOSIS — Z8614 Personal history of Methicillin resistant Staphylococcus aureus infection: Secondary | ICD-10-CM | POA: Insufficient documentation

## 2014-11-21 DIAGNOSIS — E785 Hyperlipidemia, unspecified: Secondary | ICD-10-CM | POA: Insufficient documentation

## 2014-11-21 DIAGNOSIS — K219 Gastro-esophageal reflux disease without esophagitis: Secondary | ICD-10-CM | POA: Insufficient documentation

## 2014-11-21 DIAGNOSIS — E119 Type 2 diabetes mellitus without complications: Secondary | ICD-10-CM | POA: Insufficient documentation

## 2014-11-21 LAB — BASIC METABOLIC PANEL
Anion gap: 8 (ref 5–15)
BUN: 13 mg/dL (ref 6–20)
CHLORIDE: 102 mmol/L (ref 101–111)
CO2: 27 mmol/L (ref 22–32)
Calcium: 8.7 mg/dL — ABNORMAL LOW (ref 8.9–10.3)
Creatinine, Ser: 0.74 mg/dL (ref 0.44–1.00)
GFR calc Af Amer: >60 mL/min (ref >60)
GFR calc non Af Amer: >60 mL/min (ref >60)
Glucose, Bld: 139 mg/dL — ABNORMAL HIGH (ref 65–99)
POTASSIUM: 4 mmol/L (ref 3.5–5.1)
SODIUM: 137 mmol/L (ref 135–145)

## 2014-11-21 LAB — CBC WITH DIFFERENTIAL/PLATELET
Basophils Absolute: 0 10*3/uL (ref 0.0–0.1)
Basophils Relative: 0 %
EOS ABS: 0.1 10*3/uL (ref 0.0–0.7)
Eosinophils Relative: 2 %
HEMATOCRIT: 43 % (ref 36.0–46.0)
HEMOGLOBIN: 13.9 g/dL (ref 12.0–15.0)
Lymphocytes Relative: 31 %
Lymphs Abs: 2.2 10*3/uL (ref 0.7–4.0)
MCH: 26.1 pg (ref 26.0–34.0)
MCHC: 32.3 g/dL (ref 30.0–36.0)
MCV: 80.7 fL (ref 78.0–100.0)
Monocytes Absolute: 0.9 10*3/uL (ref 0.1–1.0)
Monocytes Relative: 13 %
Neutro Abs: 3.8 10*3/uL (ref 1.7–7.7)
Neutrophils Relative %: 54 %
Platelets: 179 10*3/uL (ref 150–400)
RBC: 5.33 MIL/uL — AB (ref 3.87–5.11)
RDW: 13.8 % (ref 11.5–15.5)
WBC: 7 10*3/uL (ref 4.0–10.5)

## 2014-11-21 LAB — URINALYSIS, ROUTINE W REFLEX MICROSCOPIC
BILIRUBIN URINE: NEGATIVE
Bilirubin Urine: NEGATIVE
GLUCOSE, UA: NEGATIVE mg/dL
GLUCOSE, UA: NEGATIVE mg/dL
HGB URINE DIPSTICK: NEGATIVE
Hgb urine dipstick: NEGATIVE
Ketones, ur: NEGATIVE mg/dL
Ketones, ur: NEGATIVE mg/dL
LEUKOCYTES UA: NEGATIVE
Leukocytes, UA: NEGATIVE
Nitrite: NEGATIVE
Nitrite: NEGATIVE
PROTEIN: NEGATIVE mg/dL
PROTEIN: NEGATIVE mg/dL
Specific Gravity, Urine: 1.023 (ref 1.005–1.030)
Specific Gravity, Urine: 1.024 (ref 1.005–1.030)
UROBILINOGEN UA: 1 mg/dL (ref 0.0–1.0)
UROBILINOGEN UA: 1 mg/dL (ref 0.0–1.0)
pH: 5 (ref 5.0–8.0)
pH: 5.5 (ref 5.0–8.0)

## 2014-11-21 LAB — OCCULT BLOOD X 1 CARD TO LAB, STOOL: FECAL OCCULT BLD: NEGATIVE

## 2014-11-21 MED ORDER — SODIUM CHLORIDE 0.9 % IV BOLUS (SEPSIS)
1000.0000 mL | Freq: Once | INTRAVENOUS | Status: AC
  Administered 2014-11-21: 1000 mL via INTRAVENOUS

## 2014-11-21 MED ORDER — METOCLOPRAMIDE HCL 5 MG/ML IJ SOLN
10.0000 mg | Freq: Once | INTRAMUSCULAR | Status: AC
  Administered 2014-11-21: 10 mg via INTRAVENOUS
  Filled 2014-11-21: qty 2

## 2014-11-21 MED ORDER — METOCLOPRAMIDE HCL 10 MG PO TABS: 10.0000 mg | ORAL_TABLET | Freq: Four times a day (QID) | ORAL | Status: DC | PRN

## 2014-11-21 NOTE — Discharge Instructions (Signed)
Don't take any hydrochlorothiazide tomorrow .Take Imodium as directed for diarrhea. Drink at least six 8 ounce glasses of water tomorrow. Avoid milk or food food containing milk such as cheese or ice cream while having diarrhea. Contact Dr. Brigitte Pulse if you continue to have vomiting diarrhea or not improving in 48 hours. Return if you feel worse for any reason.
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
## 2014-11-21 NOTE — ED Notes (Signed)
MD at bedside. 

## 2014-11-21 NOTE — ED Notes (Signed)
Pt states that she has been having severe diarrhea for 10 days, pt was evaluated by pcp last tue, started on antibiotics but she has been unable to keep it down

## 2014-11-27 ENCOUNTER — Encounter: Payer: Medicare Other | Attending: Registered Nurse | Admitting: Registered Nurse

## 2014-11-27 ENCOUNTER — Encounter: Payer: Self-pay | Admitting: Registered Nurse

## 2014-11-27 VITALS — BP 119/54 | HR 94 | Resp 14

## 2014-11-27 DIAGNOSIS — Z5181 Encounter for therapeutic drug level monitoring: Secondary | ICD-10-CM | POA: Diagnosis present

## 2014-11-27 DIAGNOSIS — G894 Chronic pain syndrome: Secondary | ICD-10-CM | POA: Diagnosis not present

## 2014-11-27 DIAGNOSIS — S8412XS Injury of peroneal nerve at lower leg level, left leg, sequela: Secondary | ICD-10-CM

## 2014-11-27 DIAGNOSIS — G5730 Lesion of lateral popliteal nerve, unspecified lower limb: Secondary | ICD-10-CM | POA: Diagnosis not present

## 2014-11-27 DIAGNOSIS — Z79899 Other long term (current) drug therapy: Secondary | ICD-10-CM | POA: Diagnosis present

## 2014-11-27 MED ORDER — OXYCODONE HCL ER 20 MG PO T12A: 20.0000 mg | EXTENDED_RELEASE_TABLET | Freq: Two times a day (BID) | ORAL | Status: DC
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
## 2014-12-26 ENCOUNTER — Encounter: Payer: Medicare Other | Attending: Registered Nurse | Admitting: Registered Nurse

## 2014-12-26 ENCOUNTER — Encounter: Payer: Self-pay | Admitting: Registered Nurse

## 2014-12-26 VITALS — BP 147/85 | HR 88 | Resp 15

## 2014-12-26 DIAGNOSIS — Z5181 Encounter for therapeutic drug level monitoring: Secondary | ICD-10-CM | POA: Insufficient documentation

## 2014-12-26 DIAGNOSIS — S8412XS Injury of peroneal nerve at lower leg level, left leg, sequela: Secondary | ICD-10-CM | POA: Diagnosis not present

## 2014-12-26 DIAGNOSIS — Z79899 Other long term (current) drug therapy: Secondary | ICD-10-CM | POA: Diagnosis present

## 2014-12-26 DIAGNOSIS — G5730 Lesion of lateral popliteal nerve, unspecified lower limb: Secondary | ICD-10-CM

## 2014-12-26 DIAGNOSIS — G894 Chronic pain syndrome: Secondary | ICD-10-CM | POA: Insufficient documentation

## 2014-12-26 MED ORDER — OXYCODONE HCL ER 20 MG PO T12A: 20.0000 mg | EXTENDED_RELEASE_TABLET | Freq: Two times a day (BID) | ORAL | Status: DC

## 2014-12-26 MED ORDER — OXYCODONE HCL 10 MG PO TABS: 10.0000 mg | ORAL_TABLET | Freq: Three times a day (TID) | ORAL | Status: DC | PRN
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
## 2014-12-27 ENCOUNTER — Other Ambulatory Visit: Payer: Self-pay | Admitting: Registered Nurse

## 2014-12-28 LAB — PMP ALCOHOL METABOLITE (ETG): ETGU: NEGATIVE ng/mL

## 2014-12-31 LAB — BENZODIAZEPINES (GC/LC/MS), URINE
Alprazolam metabolite (GC/LC/MS), ur confirm: NEGATIVE ng/mL (ref <25)
Clonazepam metabolite (GC/LC/MS), ur confirm: 281 ng/mL — AB (ref <25)
Flurazepam metabolite (GC/LC/MS), ur confirm: NEGATIVE ng/mL (ref <50)
Lorazepam (GC/LC/MS), ur confirm: NEGATIVE ng/mL (ref <50)
MIDAZOLAMU: NEGATIVE ng/mL (ref <50)
Nordiazepam (GC/LC/MS), ur confirm: NEGATIVE ng/mL (ref <50)
Oxazepam (GC/LC/MS), ur confirm: NEGATIVE ng/mL (ref <50)
TEMAZEPAMU: NEGATIVE ng/mL (ref <50)
TRIAZOLAMU: NEGATIVE ng/mL (ref <50)

## 2014-12-31 LAB — OXYCODONE, URINE (LC/MS-MS)
Noroxycodone, Ur: >10000 ng/mL (ref <50)
OXYMORPHONE, URINE: 4228 ng/mL (ref <50)
Oxycodone, ur: 3291 ng/mL (ref <50)

## 2014-12-31 LAB — OPIATES/OPIOIDS (LC/MS-MS)
CODEINE URINE: NEGATIVE ng/mL (ref <50)
HYDROMORPHONE: NEGATIVE ng/mL (ref <50)
Hydrocodone: NEGATIVE ng/mL (ref <50)
MORPHINE: NEGATIVE ng/mL (ref <50)
Norhydrocodone, Ur: NEGATIVE ng/mL (ref <50)
Noroxycodone, Ur: >10000 ng/mL (ref <50)
OXYCODONE, UR: 3291 ng/mL (ref <50)
Oxymorphone: 4228 ng/mL (ref <50)

## 2015-01-02 ENCOUNTER — Ambulatory Visit: Payer: BC Managed Care – PPO | Admitting: Registered Nurse

## 2015-01-02 LAB — PRESCRIPTION MONITORING PROFILE (SOLSTAS)
Amphetamine/Meth: NEGATIVE ng/mL
Barbiturate Screen, Urine: NEGATIVE ng/mL
Buprenorphine, Urine: NEGATIVE ng/mL
CANNABINOID SCRN UR: NEGATIVE ng/mL
CARISOPRODOL, URINE: NEGATIVE ng/mL
COCAINE METABOLITES: NEGATIVE ng/mL
CREATININE, URINE: 177.02 mg/dL (ref >20.0)
ECSTASY: NEGATIVE ng/mL
FENTANYL URINE: NEGATIVE ng/mL
MEPERIDINE UR: NEGATIVE ng/mL
METHADONE SCREEN, URINE: NEGATIVE ng/mL
Nitrites, Initial: NEGATIVE ug/mL
PH URINE, INITIAL: 7 pH (ref 4.5–8.9)
Propoxyphene: NEGATIVE ng/mL
Tapentadol, urine: NEGATIVE ng/mL
Tramadol Scrn, Ur: NEGATIVE ng/mL
Zolpidem, Urine: NEGATIVE ng/mL

## 2015-01-09 ENCOUNTER — Other Ambulatory Visit: Payer: Self-pay | Admitting: Physical Medicine & Rehabilitation

## 2015-01-11 NOTE — Progress Notes (Signed)
Urine drug screen for this encounter is consistent for prescribed medication 

## 2015-01-22 ENCOUNTER — Encounter: Payer: Medicare Other | Attending: Registered Nurse | Admitting: Physical Medicine & Rehabilitation

## 2015-01-22 ENCOUNTER — Encounter: Payer: Self-pay | Admitting: Physical Medicine & Rehabilitation

## 2015-01-22 DIAGNOSIS — S8412XS Injury of peroneal nerve at lower leg level, left leg, sequela: Secondary | ICD-10-CM

## 2015-01-22 DIAGNOSIS — Z5181 Encounter for therapeutic drug level monitoring: Secondary | ICD-10-CM | POA: Diagnosis present

## 2015-01-22 DIAGNOSIS — S066X0S Traumatic subarachnoid hemorrhage without loss of consciousness, sequela: Secondary | ICD-10-CM | POA: Diagnosis not present

## 2015-01-22 DIAGNOSIS — G894 Chronic pain syndrome: Secondary | ICD-10-CM | POA: Insufficient documentation

## 2015-01-22 DIAGNOSIS — Z79899 Other long term (current) drug therapy: Secondary | ICD-10-CM | POA: Diagnosis present

## 2015-01-22 DIAGNOSIS — G5712 Meralgia paresthetica, left lower limb: Secondary | ICD-10-CM | POA: Diagnosis not present

## 2015-01-22 MED ORDER — OXYCODONE HCL ER 20 MG PO T12A: 20.0000 mg | EXTENDED_RELEASE_TABLET | Freq: Two times a day (BID) | ORAL | Status: DC

## 2015-01-22 MED ORDER — NORTRIPTYLINE HCL 10 MG PO CAPS: 10.0000 mg | ORAL_CAPSULE | Freq: Every day | ORAL | Status: DC

## 2015-01-22 MED ORDER — OXYCODONE HCL 10 MG PO TABS: 10.0000 mg | ORAL_TABLET | Freq: Three times a day (TID) | ORAL | Status: DC | PRN
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
## 2015-01-22 NOTE — Patient Instructions (Signed)
CONSIDER A FOOT UP AFO       PLEASE CALL ME WITH ANY PROBLEMS OR QUESTIONS (#[phone]). HAVE A HAPPY HOLIDAY SEASON!!!

## 2015-03-08 ENCOUNTER — Other Ambulatory Visit: Payer: Self-pay | Admitting: *Deleted

## 2015-03-08 DIAGNOSIS — S8412XS Injury of peroneal nerve at lower leg level, left leg, sequela: Secondary | ICD-10-CM

## 2015-03-08 DIAGNOSIS — G5712 Meralgia paresthetica, left lower limb: Secondary | ICD-10-CM

## 2015-03-08 DIAGNOSIS — S066X0S Traumatic subarachnoid hemorrhage without loss of consciousness, sequela: Secondary | ICD-10-CM

## 2015-03-08 MED ORDER — NORTRIPTYLINE HCL 10 MG PO CAPS: 10.0000 mg | ORAL_CAPSULE | Freq: Every day | ORAL | Status: DC

## 2015-03-15 ENCOUNTER — Other Ambulatory Visit: Payer: Self-pay | Admitting: *Deleted

## 2015-03-15 MED ORDER — GABAPENTIN 600 MG PO TABS: 600.0000 mg | ORAL_TABLET | Freq: Two times a day (BID) | ORAL | Status: DC

## 2015-03-16 ENCOUNTER — Other Ambulatory Visit: Payer: Self-pay | Admitting: Physical Medicine & Rehabilitation

## 2015-03-20 ENCOUNTER — Telehealth: Payer: Self-pay | Admitting: *Deleted

## 2015-03-20 NOTE — Telephone Encounter (Addendum)
Evonna called and wants to discuss changing pharmacy to mail order. Attempted to reach her. Had to leave message for her to call us back

## 2015-03-21 ENCOUNTER — Encounter: Payer: Self-pay | Admitting: Registered Nurse

## 2015-03-21 ENCOUNTER — Encounter: Payer: Self-pay | Admitting: *Deleted

## 2015-03-21 ENCOUNTER — Encounter: Payer: Medicare Other | Admitting: Registered Nurse

## 2015-03-21 ENCOUNTER — Encounter: Payer: Medicare Other | Attending: Registered Nurse | Admitting: Registered Nurse

## 2015-03-21 VITALS — BP 144/81 | HR 107

## 2015-03-21 DIAGNOSIS — Z79899 Other long term (current) drug therapy: Secondary | ICD-10-CM | POA: Diagnosis present

## 2015-03-21 DIAGNOSIS — S8412XS Injury of peroneal nerve at lower leg level, left leg, sequela: Secondary | ICD-10-CM | POA: Diagnosis not present

## 2015-03-21 DIAGNOSIS — G894 Chronic pain syndrome: Secondary | ICD-10-CM | POA: Diagnosis not present

## 2015-03-21 DIAGNOSIS — S066X0S Traumatic subarachnoid hemorrhage without loss of consciousness, sequela: Secondary | ICD-10-CM | POA: Diagnosis not present

## 2015-03-21 DIAGNOSIS — G5712 Meralgia paresthetica, left lower limb: Secondary | ICD-10-CM | POA: Diagnosis not present

## 2015-03-21 DIAGNOSIS — Z5181 Encounter for therapeutic drug level monitoring: Secondary | ICD-10-CM | POA: Insufficient documentation

## 2015-03-21 MED ORDER — OXYCODONE HCL 10 MG PO TABS: 10.0000 mg | ORAL_TABLET | Freq: Three times a day (TID) | ORAL | Status: DC | PRN

## 2015-03-21 MED ORDER — OXYCODONE HCL ER 20 MG PO T12A: 20.0000 mg | EXTENDED_RELEASE_TABLET | Freq: Two times a day (BID) | ORAL | Status: DC
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
## 2015-03-28 ENCOUNTER — Telehealth: Payer: Self-pay | Admitting: Physical Medicine & Rehabilitation

## 2015-03-28 NOTE — Telephone Encounter (Signed)
Patients primary doctor called her and told her that the new medication that Dr. Naaman Plummer put her on Nortriptyline was the same as her medication Mirtazapine and wanted to know if she should be taking both medications.  Please call patient back at [phone].

## 2015-03-28 NOTE — Telephone Encounter (Signed)
error 

## 2015-03-29 NOTE — Telephone Encounter (Signed)
Please advise on this.  

## 2015-03-30 NOTE — Telephone Encounter (Signed)
Pt advised. She will continue both medications.

## 2015-03-30 NOTE — Telephone Encounter (Signed)
They are NOT the same medications. There are some similarities between the two however. She should take both. If her PCP has an issue, he may contact me. thx

## 2015-05-17 ENCOUNTER — Other Ambulatory Visit: Payer: Self-pay | Admitting: Physical Medicine & Rehabilitation

## 2015-05-22 ENCOUNTER — Encounter: Payer: Self-pay | Admitting: Registered Nurse

## 2015-05-22 ENCOUNTER — Encounter: Payer: Medicare Other | Attending: Registered Nurse | Admitting: Registered Nurse

## 2015-05-22 VITALS — BP 129/41 | HR 76

## 2015-05-22 DIAGNOSIS — G894 Chronic pain syndrome: Secondary | ICD-10-CM | POA: Diagnosis not present

## 2015-05-22 DIAGNOSIS — G5730 Lesion of lateral popliteal nerve, unspecified lower limb: Secondary | ICD-10-CM | POA: Diagnosis not present

## 2015-05-22 DIAGNOSIS — S8412XS Injury of peroneal nerve at lower leg level, left leg, sequela: Secondary | ICD-10-CM | POA: Diagnosis not present

## 2015-05-22 DIAGNOSIS — Z79899 Other long term (current) drug therapy: Secondary | ICD-10-CM | POA: Diagnosis present

## 2015-05-22 DIAGNOSIS — G5712 Meralgia paresthetica, left lower limb: Secondary | ICD-10-CM | POA: Diagnosis not present

## 2015-05-22 DIAGNOSIS — S066X0S Traumatic subarachnoid hemorrhage without loss of consciousness, sequela: Secondary | ICD-10-CM

## 2015-05-22 DIAGNOSIS — Z5181 Encounter for therapeutic drug level monitoring: Secondary | ICD-10-CM | POA: Diagnosis present

## 2015-05-22 MED ORDER — OXYCODONE HCL ER 20 MG PO T12A: 20.0000 mg | EXTENDED_RELEASE_TABLET | Freq: Two times a day (BID) | ORAL | Status: DC

## 2015-05-22 MED ORDER — OXYCODONE HCL 10 MG PO TABS: 10.0000 mg | ORAL_TABLET | Freq: Three times a day (TID) | ORAL | Status: DC | PRN
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
## 2015-05-23 ENCOUNTER — Other Ambulatory Visit: Payer: Self-pay | Admitting: Family Medicine

## 2015-05-23 DIAGNOSIS — R4182 Altered mental status, unspecified: Secondary | ICD-10-CM

## 2015-05-28 ENCOUNTER — Ambulatory Visit
Admission: RE | Admit: 2015-05-28 | Discharge: 2015-05-28 | Disposition: A | Discharge status: Home / Self Care | Payer: Medicare Other | Source: Ambulatory Visit | Attending: Family Medicine | Admitting: Family Medicine

## 2015-05-28 DIAGNOSIS — R4182 Altered mental status, unspecified: Secondary | ICD-10-CM

## 2015-05-28 MED ORDER — IOPAMIDOL (ISOVUE-300) INJECTION 61%
75.0000 mL | Freq: Once | INTRAVENOUS | Status: AC | PRN
  Administered 2015-05-28: 75 mL via INTRAVENOUS

## 2015-07-23 ENCOUNTER — Encounter: Payer: Self-pay | Admitting: Registered Nurse

## 2015-07-23 ENCOUNTER — Encounter: Payer: Medicare Other | Attending: Registered Nurse | Admitting: Registered Nurse

## 2015-07-23 VITALS — BP 112/68 | HR 71 | Resp 14

## 2015-07-23 DIAGNOSIS — S8412XS Injury of peroneal nerve at lower leg level, left leg, sequela: Secondary | ICD-10-CM

## 2015-07-23 DIAGNOSIS — G5730 Lesion of lateral popliteal nerve, unspecified lower limb: Secondary | ICD-10-CM | POA: Diagnosis not present

## 2015-07-23 DIAGNOSIS — Z79899 Other long term (current) drug therapy: Secondary | ICD-10-CM | POA: Diagnosis present

## 2015-07-23 DIAGNOSIS — G894 Chronic pain syndrome: Secondary | ICD-10-CM | POA: Diagnosis not present

## 2015-07-23 DIAGNOSIS — M1711 Unilateral primary osteoarthritis, right knee: Secondary | ICD-10-CM

## 2015-07-23 DIAGNOSIS — G5712 Meralgia paresthetica, left lower limb: Secondary | ICD-10-CM

## 2015-07-23 DIAGNOSIS — Z5181 Encounter for therapeutic drug level monitoring: Secondary | ICD-10-CM | POA: Diagnosis present

## 2015-07-23 DIAGNOSIS — M179 Osteoarthritis of knee, unspecified: Secondary | ICD-10-CM

## 2015-07-23 MED ORDER — VOLTAREN 1 % TD GEL: 2.0000 g | Freq: Four times a day (QID) | TRANSDERMAL | Status: DC | PRN

## 2015-07-23 MED ORDER — OXYCODONE HCL ER 20 MG PO T12A: 20.0000 mg | EXTENDED_RELEASE_TABLET | Freq: Two times a day (BID) | ORAL | Status: DC

## 2015-07-23 MED ORDER — OXYCODONE HCL 10 MG PO TABS: 10.0000 mg | ORAL_TABLET | Freq: Three times a day (TID) | ORAL | Status: DC | PRN
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
## 2015-08-03 LAB — TOXASSURE SELECT,+ANTIDEPR,UR

## 2015-08-07 NOTE — Progress Notes (Signed)
Urine drug screen for this encounter is consistent for prescribed medications.   

## 2015-09-19 ENCOUNTER — Encounter: Payer: Self-pay | Admitting: Physical Medicine & Rehabilitation

## 2015-09-19 ENCOUNTER — Encounter: Payer: Medicare Other | Attending: Registered Nurse | Admitting: Physical Medicine & Rehabilitation

## 2015-09-19 VITALS — BP 110/76 | HR 80

## 2015-09-19 DIAGNOSIS — Z5181 Encounter for therapeutic drug level monitoring: Secondary | ICD-10-CM | POA: Diagnosis not present

## 2015-09-19 DIAGNOSIS — G5712 Meralgia paresthetica, left lower limb: Secondary | ICD-10-CM

## 2015-09-19 DIAGNOSIS — S8412XS Injury of peroneal nerve at lower leg level, left leg, sequela: Secondary | ICD-10-CM | POA: Diagnosis not present

## 2015-09-19 DIAGNOSIS — G894 Chronic pain syndrome: Secondary | ICD-10-CM | POA: Insufficient documentation

## 2015-09-19 DIAGNOSIS — Z79899 Other long term (current) drug therapy: Secondary | ICD-10-CM | POA: Diagnosis not present

## 2015-09-19 MED ORDER — OXYCODONE HCL ER 20 MG PO T12A: 20.0000 mg | EXTENDED_RELEASE_TABLET | Freq: Two times a day (BID) | ORAL | Status: DC

## 2015-09-19 MED ORDER — OXYCODONE HCL 10 MG PO TABS: 10.0000 mg | ORAL_TABLET | Freq: Three times a day (TID) | ORAL | Status: DC | PRN

## 2015-09-19 NOTE — Patient Instructions (Addendum)
      TRY AN 200-400MG OF IBUPROFEN PRIOR TO DAYS WHERE YOU WILL BE MORE ACTIVE AND ON YOUR FEET. YOU MAY TAKE IT EVERY 8 HOURS AS NEEDED. ICE YOUR KNEE/ANKLE AFTER THE PROLONGED ACTIVITY.  WORK ON YOUR WEIGHT/IMPROVE YOUR DIET.  HEAT MAY BE HELPFUL BEFORE ACTIVITIES.   SUPPLEMENTS FOR PAIN/ANTI-INFLAMMATORY USE: GLUCOSAMINE WITH CHONDROITIN, TART CHERRY EXTRACT, TURMERIC, GINGER
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
## 2015-10-19 ENCOUNTER — Telehealth: Payer: Self-pay | Admitting: Physical Medicine & Rehabilitation

## 2015-10-19 NOTE — Telephone Encounter (Signed)
[facility] needs to get a prior authorization on Voltaren Gell for patient.  They said they faxed a form over for patient.  Please call them at [phone] ref# [id number].

## 2015-10-22 ENCOUNTER — Encounter: Payer: Self-pay | Admitting: Physical Medicine & Rehabilitation

## 2015-10-22 ENCOUNTER — Encounter: Payer: Medicare Other | Attending: Registered Nurse | Admitting: Physical Medicine & Rehabilitation

## 2015-10-22 VITALS — BP 113/77 | HR 73

## 2015-10-22 DIAGNOSIS — G5712 Meralgia paresthetica, left lower limb: Secondary | ICD-10-CM | POA: Diagnosis not present

## 2015-10-22 DIAGNOSIS — G894 Chronic pain syndrome: Secondary | ICD-10-CM | POA: Diagnosis present

## 2015-10-22 DIAGNOSIS — S8412XS Injury of peroneal nerve at lower leg level, left leg, sequela: Secondary | ICD-10-CM

## 2015-10-22 DIAGNOSIS — M19172 Post-traumatic osteoarthritis, left ankle and foot: Secondary | ICD-10-CM

## 2015-10-22 DIAGNOSIS — Z5181 Encounter for therapeutic drug level monitoring: Secondary | ICD-10-CM | POA: Insufficient documentation

## 2015-10-22 DIAGNOSIS — M12572 Traumatic arthropathy, left ankle and foot: Secondary | ICD-10-CM

## 2015-10-22 DIAGNOSIS — Z79899 Other long term (current) drug therapy: Secondary | ICD-10-CM | POA: Insufficient documentation

## 2015-10-22 DIAGNOSIS — G5710 Meralgia paresthetica, unspecified lower limb: Secondary | ICD-10-CM | POA: Diagnosis not present

## 2015-10-22 MED ORDER — OXYCODONE HCL 10 MG PO TABS: 10.0000 mg | ORAL_TABLET | Freq: Three times a day (TID) | ORAL | 0 refills | Status: DC | PRN

## 2015-10-22 MED ORDER — OXYCODONE HCL ER 20 MG PO T12A: 20.0000 mg | EXTENDED_RELEASE_TABLET | Freq: Two times a day (BID) | ORAL | 0 refills | Status: DC

## 2015-10-22 MED ORDER — GABAPENTIN 600 MG PO TABS: 300.0000 mg | ORAL_TABLET | Freq: Three times a day (TID) | ORAL | 3 refills | Status: DC
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
## 2015-10-22 NOTE — Patient Instructions (Signed)
PLEASE CALL ME WITH ANY PROBLEMS OR QUESTIONS ([phone])  TRY TAKING 300-600MG OF GABAPENTIN AT NIGHT BEFORE BED. START WITH 300MG

## 2015-11-09 NOTE — Telephone Encounter (Signed)
PA submitted through cover my meds

## 2015-12-19 ENCOUNTER — Encounter: Payer: Medicare Other | Admitting: Physical Medicine & Rehabilitation

## 2015-12-31 ENCOUNTER — Encounter: Payer: Self-pay | Admitting: Physical Medicine & Rehabilitation

## 2015-12-31 ENCOUNTER — Encounter: Payer: Medicare Other | Attending: Registered Nurse | Admitting: Physical Medicine & Rehabilitation

## 2015-12-31 VITALS — BP 100/69 | HR 65

## 2015-12-31 DIAGNOSIS — Z5181 Encounter for therapeutic drug level monitoring: Secondary | ICD-10-CM | POA: Insufficient documentation

## 2015-12-31 DIAGNOSIS — G894 Chronic pain syndrome: Secondary | ICD-10-CM | POA: Diagnosis present

## 2015-12-31 DIAGNOSIS — S8410XS Injury of peroneal nerve at lower leg level, unspecified leg, sequela: Secondary | ICD-10-CM

## 2015-12-31 DIAGNOSIS — G5712 Meralgia paresthetica, left lower limb: Secondary | ICD-10-CM | POA: Diagnosis not present

## 2015-12-31 DIAGNOSIS — Z79899 Other long term (current) drug therapy: Secondary | ICD-10-CM | POA: Insufficient documentation

## 2015-12-31 DIAGNOSIS — M19172 Post-traumatic osteoarthritis, left ankle and foot: Secondary | ICD-10-CM

## 2015-12-31 MED ORDER — OXYCODONE HCL ER 10 MG PO T12A: 10.0000 mg | EXTENDED_RELEASE_TABLET | Freq: Two times a day (BID) | ORAL | Status: DC

## 2015-12-31 MED ORDER — OXYCODONE HCL ER 10 MG PO T12A: 10.0000 mg | EXTENDED_RELEASE_TABLET | Freq: Two times a day (BID) | ORAL | 0 refills | Status: DC

## 2015-12-31 MED ORDER — OXYCODONE HCL 10 MG PO TABS: 10.0000 mg | ORAL_TABLET | Freq: Three times a day (TID) | ORAL | 0 refills | Status: DC | PRN
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
## 2015-12-31 NOTE — Patient Instructions (Signed)
PLEASE CALL ME WITH ANY PROBLEMS OR QUESTIONS ([phone])  

## 2016-01-02 ENCOUNTER — Other Ambulatory Visit: Payer: Self-pay | Admitting: Physical Medicine & Rehabilitation

## 2016-01-02 DIAGNOSIS — G5710 Meralgia paresthetica, unspecified lower limb: Secondary | ICD-10-CM

## 2016-01-02 DIAGNOSIS — M19172 Post-traumatic osteoarthritis, left ankle and foot: Secondary | ICD-10-CM

## 2016-01-02 DIAGNOSIS — S8412XS Injury of peroneal nerve at lower leg level, left leg, sequela: Secondary | ICD-10-CM

## 2016-02-06 ENCOUNTER — Telehealth: Payer: Self-pay

## 2016-02-06 DIAGNOSIS — G5712 Meralgia paresthetica, left lower limb: Secondary | ICD-10-CM

## 2016-02-06 DIAGNOSIS — S8410XS Injury of peroneal nerve at lower leg level, unspecified leg, sequela: Secondary | ICD-10-CM

## 2016-02-06 DIAGNOSIS — M19172 Post-traumatic osteoarthritis, left ankle and foot: Secondary | ICD-10-CM

## 2016-02-06 MED ORDER — OXYCODONE HCL ER 10 MG PO T12A: 10.0000 mg | EXTENDED_RELEASE_TABLET | Freq: Two times a day (BID) | ORAL | 0 refills | Status: DC

## 2016-02-06 NOTE — Telephone Encounter (Signed)
Patient called stating was told to begin tapering off current pain medication, stated is in very large amounts of pain in legs and is asking to return back to dosage before tapering.

## 2016-02-06 NOTE — Telephone Encounter (Signed)
Can resume oxycontin at 23m q12. rx in box

## 2016-02-07 ENCOUNTER — Telehealth: Payer: Self-pay | Admitting: *Deleted

## 2016-02-07 NOTE — Telephone Encounter (Signed)
Left message on pt's VM, script is ready for pick up

## 2016-02-13 ENCOUNTER — Other Ambulatory Visit: Payer: Self-pay | Admitting: Registered Nurse

## 2016-02-13 ENCOUNTER — Other Ambulatory Visit: Payer: Self-pay | Admitting: Physical Medicine & Rehabilitation

## 2016-02-13 DIAGNOSIS — G5712 Meralgia paresthetica, left lower limb: Secondary | ICD-10-CM

## 2016-02-13 DIAGNOSIS — S066X0S Traumatic subarachnoid hemorrhage without loss of consciousness, sequela: Secondary | ICD-10-CM

## 2016-02-13 DIAGNOSIS — S8412XS Injury of peroneal nerve at lower leg level, left leg, sequela: Secondary | ICD-10-CM

## 2016-02-27 ENCOUNTER — Encounter: Payer: Self-pay | Admitting: Physical Medicine & Rehabilitation

## 2016-02-27 ENCOUNTER — Encounter: Payer: Medicare Other | Attending: Registered Nurse | Admitting: Physical Medicine & Rehabilitation

## 2016-02-27 VITALS — BP 115/83 | HR 75

## 2016-02-27 DIAGNOSIS — M19172 Post-traumatic osteoarthritis, left ankle and foot: Secondary | ICD-10-CM | POA: Diagnosis not present

## 2016-02-27 DIAGNOSIS — G5712 Meralgia paresthetica, left lower limb: Secondary | ICD-10-CM | POA: Diagnosis not present

## 2016-02-27 DIAGNOSIS — Z79899 Other long term (current) drug therapy: Secondary | ICD-10-CM | POA: Diagnosis present

## 2016-02-27 DIAGNOSIS — M170 Bilateral primary osteoarthritis of knee: Secondary | ICD-10-CM

## 2016-02-27 DIAGNOSIS — S8410XS Injury of peroneal nerve at lower leg level, unspecified leg, sequela: Secondary | ICD-10-CM | POA: Diagnosis not present

## 2016-02-27 DIAGNOSIS — G894 Chronic pain syndrome: Secondary | ICD-10-CM | POA: Diagnosis present

## 2016-02-27 DIAGNOSIS — Z5181 Encounter for therapeutic drug level monitoring: Secondary | ICD-10-CM | POA: Insufficient documentation

## 2016-02-27 MED ORDER — OXYCODONE HCL 10 MG PO TABS: 10.0000 mg | ORAL_TABLET | Freq: Three times a day (TID) | ORAL | 0 refills | Status: DC | PRN

## 2016-02-27 MED ORDER — OXYCODONE HCL ER 10 MG PO T12A: 10.0000 mg | EXTENDED_RELEASE_TABLET | Freq: Two times a day (BID) | ORAL | 0 refills | Status: DC

## 2016-02-27 MED ORDER — VOLTAREN 1 % TD GEL: TRANSDERMAL | 2 refills | Status: DC

## 2016-02-27 NOTE — Patient Instructions (Signed)
SUPPLEMENTS USEFUL FOR OSTEOARTHRITIS: OMEGA 3 FATTY ACIDS, TURMERIC, GINGER, TART CHERRY EXTRACT, CELERY SEED, GLUCOSAMINE WITH CHONDROITIN      PLEASE CALL ME WITH ANY PROBLEMS OR QUESTIONS ([phone])   HAPPY HOLIDAYS!!!!                    *                * *             *   *   *         *  *   *  *  *     *  *  *  *  *  *  * *  *  *  *  *  *  *  *  *  * *               *  *               *  *               *  *
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
## 2016-03-05 LAB — TOXASSURE SELECT,+ANTIDEPR,UR

## 2016-03-21 ENCOUNTER — Telehealth: Payer: Self-pay | Admitting: *Deleted

## 2016-03-21 NOTE — Telephone Encounter (Signed)
Katherine [name] called and her insurance will not cover her oxycontin.  She is needing an early refill on her short acting because she has been without her oxycontin and out of meds for 3 days.  I called [facility] and they will refill the oxycodone 10 mg today for her due to this.  We will find out what her insurance will cover and address Monday.  She has [employer]. Please do a prior auth or change of medication for the long acting.

## 2016-03-24 ENCOUNTER — Telehealth: Payer: Self-pay | Admitting: *Deleted

## 2016-03-24 MED ORDER — OXYCODONE ER 9 MG PO C12A: 9.0000 mg | EXTENDED_RELEASE_CAPSULE | Freq: Two times a day (BID) | ORAL | 0 refills | Status: DC

## 2016-03-24 NOTE — Telephone Encounter (Signed)
I let Mrs Beirne know that [name] has said that no Prior auth needed and this medication can be purchased.

## 2016-03-24 NOTE — Telephone Encounter (Signed)
Katherine [name] switched to [facility] ER 9 mg q 12 hours #60.  Prior Auth submitted to [facility] for this medication.  Noa notified that Rx available for pick up.

## 2016-04-22 ENCOUNTER — Encounter: Payer: Medicare Other | Attending: Registered Nurse | Admitting: Physical Medicine & Rehabilitation

## 2016-04-22 ENCOUNTER — Encounter: Payer: Self-pay | Admitting: Physical Medicine & Rehabilitation

## 2016-04-22 VITALS — BP 114/81 | HR 89 | Resp 16

## 2016-04-22 DIAGNOSIS — S8412XS Injury of peroneal nerve at lower leg level, left leg, sequela: Secondary | ICD-10-CM | POA: Diagnosis not present

## 2016-04-22 DIAGNOSIS — G5712 Meralgia paresthetica, left lower limb: Secondary | ICD-10-CM

## 2016-04-22 DIAGNOSIS — Z720 Tobacco use: Secondary | ICD-10-CM | POA: Diagnosis not present

## 2016-04-22 DIAGNOSIS — Z5181 Encounter for therapeutic drug level monitoring: Secondary | ICD-10-CM | POA: Diagnosis present

## 2016-04-22 DIAGNOSIS — S066X3S Traumatic subarachnoid hemorrhage with loss of consciousness of 1 hour to 5 hours 59 minutes, sequela: Secondary | ICD-10-CM | POA: Diagnosis not present

## 2016-04-22 DIAGNOSIS — G894 Chronic pain syndrome: Secondary | ICD-10-CM | POA: Diagnosis not present

## 2016-04-22 DIAGNOSIS — Z79899 Other long term (current) drug therapy: Secondary | ICD-10-CM | POA: Insufficient documentation

## 2016-04-22 MED ORDER — OXYCODONE ER 9 MG PO C12A: 9.0000 mg | EXTENDED_RELEASE_CAPSULE | Freq: Two times a day (BID) | ORAL | 0 refills | Status: DC

## 2016-04-22 MED ORDER — CHANTIX 0.5 MG PO TABS: 0.5000 mg | ORAL_TABLET | Freq: Two times a day (BID) | ORAL | 2 refills | Status: DC

## 2016-04-22 MED ORDER — OXYCODONE HCL 10 MG PO TABS: 10.0000 mg | ORAL_TABLET | Freq: Three times a day (TID) | ORAL | 0 refills | Status: DC | PRN
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
## 2016-04-22 NOTE — Patient Instructions (Addendum)
Create a schedule,routine filled with exercise, house chores, social activites, spiritual activities, ?volunteer time. Set short and long term goals.  Chantix: half tab once a day for one week then half tab twice daily thereafter. If you  Aren't having any tolerance issues you can try going to a full tab twice daily in the third week.

## 2016-04-28 ENCOUNTER — Ambulatory Visit: Payer: Medicare Other | Admitting: Physical Medicine & Rehabilitation

## 2016-06-23 ENCOUNTER — Encounter: Payer: Medicare Other | Attending: Registered Nurse | Admitting: Physical Medicine & Rehabilitation

## 2016-06-23 ENCOUNTER — Encounter: Payer: Self-pay | Admitting: Physical Medicine & Rehabilitation

## 2016-06-23 VITALS — BP 97/67 | HR 83 | Resp 14

## 2016-06-23 DIAGNOSIS — G894 Chronic pain syndrome: Secondary | ICD-10-CM | POA: Insufficient documentation

## 2016-06-23 DIAGNOSIS — S066X3S Traumatic subarachnoid hemorrhage with loss of consciousness of 1 hour to 5 hours 59 minutes, sequela: Secondary | ICD-10-CM | POA: Diagnosis not present

## 2016-06-23 DIAGNOSIS — Z5181 Encounter for therapeutic drug level monitoring: Secondary | ICD-10-CM | POA: Diagnosis present

## 2016-06-23 DIAGNOSIS — G5712 Meralgia paresthetica, left lower limb: Secondary | ICD-10-CM

## 2016-06-23 DIAGNOSIS — G5710 Meralgia paresthetica, unspecified lower limb: Secondary | ICD-10-CM

## 2016-06-23 DIAGNOSIS — R1310 Dysphagia, unspecified: Secondary | ICD-10-CM | POA: Diagnosis not present

## 2016-06-23 DIAGNOSIS — S066X2S Traumatic subarachnoid hemorrhage with loss of consciousness of 31 minutes to 59 minutes, sequela: Secondary | ICD-10-CM | POA: Diagnosis not present

## 2016-06-23 DIAGNOSIS — S8412XS Injury of peroneal nerve at lower leg level, left leg, sequela: Secondary | ICD-10-CM | POA: Diagnosis not present

## 2016-06-23 DIAGNOSIS — Z79899 Other long term (current) drug therapy: Secondary | ICD-10-CM | POA: Diagnosis present

## 2016-06-23 DIAGNOSIS — Z720 Tobacco use: Secondary | ICD-10-CM | POA: Diagnosis not present

## 2016-06-23 DIAGNOSIS — R1319 Other dysphagia: Secondary | ICD-10-CM

## 2016-06-23 MED ORDER — OXYCODONE ER 9 MG PO C12A: 9.0000 mg | EXTENDED_RELEASE_CAPSULE | Freq: Two times a day (BID) | ORAL | 0 refills | Status: DC

## 2016-06-23 MED ORDER — DULOXETINE HCL 30 MG PO CPEP: 90.0000 mg | ORAL_CAPSULE | Freq: Every day | ORAL | 5 refills | Status: DC

## 2016-06-23 MED ORDER — OXYCODONE HCL 10 MG PO TABS: 10.0000 mg | ORAL_TABLET | Freq: Three times a day (TID) | ORAL | 0 refills | Status: DC | PRN

## 2016-06-23 NOTE — Patient Instructions (Addendum)
      PLENTY OF ICE,REST TO LEFT SHOULDER, AVOID SLEEPING ON LEFT SHOULDER.    MAKE SURE YOU'RE DRINKING ENOUGH AND GETTING ENOUGH CALCIUM, POTASSIUM, AND MAGNESIUM.
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
## 2016-06-30 ENCOUNTER — Ambulatory Visit
Admission: RE | Admit: 2016-06-30 | Discharge: 2016-06-30 | Disposition: A | Discharge status: Home / Self Care | Payer: Medicare Other | Source: Ambulatory Visit | Attending: Physical Medicine & Rehabilitation | Admitting: Physical Medicine & Rehabilitation

## 2016-06-30 DIAGNOSIS — R1310 Dysphagia, unspecified: Secondary | ICD-10-CM

## 2016-06-30 DIAGNOSIS — R1319 Other dysphagia: Secondary | ICD-10-CM

## 2016-07-02 ENCOUNTER — Other Ambulatory Visit: Payer: Self-pay | Admitting: Physical Medicine & Rehabilitation

## 2016-07-02 DIAGNOSIS — G5710 Meralgia paresthetica, unspecified lower limb: Secondary | ICD-10-CM

## 2016-07-02 DIAGNOSIS — M19172 Post-traumatic osteoarthritis, left ankle and foot: Secondary | ICD-10-CM

## 2016-07-02 DIAGNOSIS — S8412XS Injury of peroneal nerve at lower leg level, left leg, sequela: Secondary | ICD-10-CM

## 2016-07-07 ENCOUNTER — Telehealth: Payer: Self-pay | Admitting: Physical Medicine & Rehabilitation

## 2016-07-07 NOTE — Telephone Encounter (Signed)
Barium swallow results  FINDINGS: Swallowing mechanism is normal there is poor primary esophageal peristalsis. No esophageal fold thickening, stricture or obstruction. A 13 mm barium tablet passed into the stomach without difficulty.  IMPRESSION: Esophageal dysmotility.   She may want to try a small dose of amlodipine to see if this helps with her swallowing. I would recommend 41m daily #30, 1RF

## 2016-07-10 MED ORDER — AMLODIPINE BESYLATE 5 MG PO TABS: 5.0000 mg | ORAL_TABLET | Freq: Every day | ORAL | 1 refills | Status: DC

## 2016-07-10 NOTE — Telephone Encounter (Signed)
Benson notified.  Medication sent to [facility].  I advised her to check BP prn

## 2016-07-23 ENCOUNTER — Encounter: Payer: Medicare Other | Attending: Registered Nurse | Admitting: Physical Medicine & Rehabilitation

## 2016-07-23 ENCOUNTER — Encounter: Payer: Self-pay | Admitting: Physical Medicine & Rehabilitation

## 2016-07-23 VITALS — BP 120/76 | HR 84 | Resp 14

## 2016-07-23 DIAGNOSIS — G5712 Meralgia paresthetica, left lower limb: Secondary | ICD-10-CM

## 2016-07-23 DIAGNOSIS — S8412XS Injury of peroneal nerve at lower leg level, left leg, sequela: Secondary | ICD-10-CM

## 2016-07-23 DIAGNOSIS — F329 Major depressive disorder, single episode, unspecified: Secondary | ICD-10-CM | POA: Diagnosis not present

## 2016-07-23 DIAGNOSIS — S32592S Other specified fracture of left pubis, sequela: Secondary | ICD-10-CM

## 2016-07-23 DIAGNOSIS — M19172 Post-traumatic osteoarthritis, left ankle and foot: Secondary | ICD-10-CM

## 2016-07-23 DIAGNOSIS — S32591S Other specified fracture of right pubis, sequela: Secondary | ICD-10-CM

## 2016-07-23 DIAGNOSIS — Z79899 Other long term (current) drug therapy: Secondary | ICD-10-CM | POA: Diagnosis present

## 2016-07-23 DIAGNOSIS — Z5181 Encounter for therapeutic drug level monitoring: Secondary | ICD-10-CM | POA: Diagnosis not present

## 2016-07-23 DIAGNOSIS — S066X3S Traumatic subarachnoid hemorrhage with loss of consciousness of 1 hour to 5 hours 59 minutes, sequela: Secondary | ICD-10-CM

## 2016-07-23 DIAGNOSIS — Z720 Tobacco use: Secondary | ICD-10-CM

## 2016-07-23 DIAGNOSIS — M7522 Bicipital tendinitis, left shoulder: Secondary | ICD-10-CM | POA: Insufficient documentation

## 2016-07-23 DIAGNOSIS — G894 Chronic pain syndrome: Secondary | ICD-10-CM

## 2016-07-23 MED ORDER — OXYCODONE ER 9 MG PO C12A: 9.0000 mg | EXTENDED_RELEASE_CAPSULE | Freq: Two times a day (BID) | ORAL | 0 refills | Status: DC

## 2016-07-23 MED ORDER — OXYCODONE HCL 10 MG PO TABS: 10.0000 mg | ORAL_TABLET | Freq: Three times a day (TID) | ORAL | 0 refills | Status: DC | PRN

## 2016-07-23 MED ORDER — DULOXETINE HCL 60 MG PO CPEP: 60.0000 mg | ORAL_CAPSULE | Freq: Every day | ORAL | 2 refills | Status: DC

## 2016-07-23 MED ORDER — DULOXETINE HCL 60 MG PO CPEP: 120.0000 mg | ORAL_CAPSULE | Freq: Every day | ORAL | 3 refills | Status: DC
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
## 2016-07-23 NOTE — Patient Instructions (Signed)
CALL ME IF YOU HAVE ANY ABNORMAL INCREASES IN YOUR HEART RATE AND/OR BLOOD PRESSURE WITH THE INCREASED CYMBALTA.

## 2016-07-27 LAB — TOXASSURE SELECT,+ANTIDEPR,UR

## 2016-07-29 ENCOUNTER — Telehealth: Payer: Self-pay | Admitting: *Deleted

## 2016-07-29 NOTE — Telephone Encounter (Signed)
Urine drug screen for this encounter is consistent for prescribed medication. Mirtazapine has not been prescribed but on her med list.

## 2016-08-20 ENCOUNTER — Other Ambulatory Visit: Payer: Self-pay | Admitting: Student

## 2016-08-20 DIAGNOSIS — M545 Low back pain, unspecified: Secondary | ICD-10-CM

## 2016-09-03 ENCOUNTER — Other Ambulatory Visit: Payer: Self-pay | Admitting: Physical Medicine & Rehabilitation

## 2016-09-24 ENCOUNTER — Encounter: Payer: Self-pay | Admitting: Physical Medicine & Rehabilitation

## 2016-09-24 ENCOUNTER — Encounter: Payer: Medicare Other | Attending: Registered Nurse | Admitting: Physical Medicine & Rehabilitation

## 2016-09-24 VITALS — BP 129/84 | HR 83

## 2016-09-24 DIAGNOSIS — G5712 Meralgia paresthetica, left lower limb: Secondary | ICD-10-CM | POA: Diagnosis not present

## 2016-09-24 DIAGNOSIS — S8412XS Injury of peroneal nerve at lower leg level, left leg, sequela: Secondary | ICD-10-CM

## 2016-09-24 DIAGNOSIS — Z5181 Encounter for therapeutic drug level monitoring: Secondary | ICD-10-CM | POA: Diagnosis present

## 2016-09-24 DIAGNOSIS — S066X3S Traumatic subarachnoid hemorrhage with loss of consciousness of 1 hour to 5 hours 59 minutes, sequela: Secondary | ICD-10-CM | POA: Diagnosis not present

## 2016-09-24 DIAGNOSIS — M19172 Post-traumatic osteoarthritis, left ankle and foot: Secondary | ICD-10-CM

## 2016-09-24 DIAGNOSIS — Z79899 Other long term (current) drug therapy: Secondary | ICD-10-CM | POA: Diagnosis present

## 2016-09-24 DIAGNOSIS — G894 Chronic pain syndrome: Secondary | ICD-10-CM | POA: Insufficient documentation

## 2016-09-24 DIAGNOSIS — R5382 Chronic fatigue, unspecified: Secondary | ICD-10-CM

## 2016-09-24 DIAGNOSIS — Z720 Tobacco use: Secondary | ICD-10-CM

## 2016-09-24 DIAGNOSIS — M170 Bilateral primary osteoarthritis of knee: Secondary | ICD-10-CM | POA: Diagnosis not present

## 2016-09-24 DIAGNOSIS — F329 Major depressive disorder, single episode, unspecified: Secondary | ICD-10-CM | POA: Diagnosis not present

## 2016-09-24 MED ORDER — VOLTAREN 1 % TD GEL: TRANSDERMAL | 2 refills | Status: DC

## 2016-09-24 MED ORDER — OXYCODONE HCL 10 MG PO TABS: 10.0000 mg | ORAL_TABLET | Freq: Three times a day (TID) | ORAL | 0 refills | Status: DC | PRN

## 2016-09-24 MED ORDER — DULOXETINE HCL 60 MG PO CPEP: 60.0000 mg | ORAL_CAPSULE | Freq: Every day | ORAL | 3 refills | Status: DC

## 2016-09-24 MED ORDER — CLONAZEPAM 0.5 MG PO TABS: 0.5000 mg | ORAL_TABLET | Freq: Two times a day (BID) | ORAL | 0 refills | Status: DC

## 2016-09-24 MED ORDER — OXYCODONE ER 9 MG PO C12A: 9.0000 mg | EXTENDED_RELEASE_CAPSULE | Freq: Two times a day (BID) | ORAL | 0 refills | Status: DC

## 2016-09-24 MED ORDER — MIRTAZAPINE 15 MG PO TABS: 15.0000 mg | ORAL_TABLET | Freq: Every day | ORAL | 3 refills | Status: DC

## 2016-09-24 NOTE — Patient Instructions (Signed)
PLEASE FEEL FREE TO CALL OUR OFFICE WITH ANY PROBLEMS OR QUESTIONS ([phone])      
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
## 2016-09-25 LAB — TSH: TSH: 3.71 u[IU]/mL (ref 0.450–4.500)

## 2016-09-25 LAB — T3, FREE: T3, Free: 3 pg/mL (ref 2.0–4.4)

## 2016-09-25 LAB — T3: T3, Total: 130 ng/dL (ref 71–180)

## 2016-09-25 LAB — T4, FREE: Free T4: 0.76 ng/dL — ABNORMAL LOW (ref 0.82–1.77)

## 2016-09-25 LAB — T4: T4, Total: 5.8 ug/dL (ref 4.5–12.0)

## 2016-09-26 ENCOUNTER — Telehealth: Payer: Self-pay | Admitting: Physical Medicine & Rehabilitation

## 2016-09-26 MED ORDER — LEVOTHYROXINE SODIUM 25 MCG PO TABS: 25.0000 ug | ORAL_TABLET | Freq: Every day | ORAL | 4 refills | Status: DC

## 2016-09-26 NOTE — Telephone Encounter (Signed)
Free T4 low. Called pt. Have rx'ed synthroid 5mg. Will d/w her at follow up re: response.

## 2016-11-02 ENCOUNTER — Other Ambulatory Visit: Payer: Medicare Other

## 2016-11-05 ENCOUNTER — Encounter: Payer: Medicare Other | Attending: Registered Nurse | Admitting: Physical Medicine & Rehabilitation

## 2016-11-05 DIAGNOSIS — Z5181 Encounter for therapeutic drug level monitoring: Secondary | ICD-10-CM | POA: Insufficient documentation

## 2016-11-05 DIAGNOSIS — Z79899 Other long term (current) drug therapy: Secondary | ICD-10-CM | POA: Insufficient documentation

## 2016-11-05 DIAGNOSIS — G894 Chronic pain syndrome: Secondary | ICD-10-CM | POA: Insufficient documentation

## 2016-11-09 ENCOUNTER — Other Ambulatory Visit: Payer: Self-pay | Admitting: Physical Medicine & Rehabilitation

## 2016-11-10 ENCOUNTER — Encounter (HOSPITAL_BASED_OUTPATIENT_CLINIC_OR_DEPARTMENT_OTHER): Payer: Medicare Other | Admitting: Psychology

## 2016-11-10 DIAGNOSIS — F331 Major depressive disorder, recurrent, moderate: Secondary | ICD-10-CM | POA: Diagnosis not present

## 2016-11-10 DIAGNOSIS — G894 Chronic pain syndrome: Secondary | ICD-10-CM | POA: Diagnosis present

## 2016-11-10 DIAGNOSIS — Z79899 Other long term (current) drug therapy: Secondary | ICD-10-CM | POA: Diagnosis present

## 2016-11-10 DIAGNOSIS — Z5181 Encounter for therapeutic drug level monitoring: Secondary | ICD-10-CM | POA: Diagnosis present

## 2016-11-10 DIAGNOSIS — R5382 Chronic fatigue, unspecified: Secondary | ICD-10-CM | POA: Diagnosis not present

## 2016-11-10 DIAGNOSIS — F411 Generalized anxiety disorder: Secondary | ICD-10-CM | POA: Diagnosis not present

## 2016-11-11 ENCOUNTER — Encounter: Payer: Self-pay | Admitting: Registered Nurse

## 2016-11-11 ENCOUNTER — Other Ambulatory Visit: Payer: Medicare Other

## 2016-11-11 ENCOUNTER — Encounter (HOSPITAL_BASED_OUTPATIENT_CLINIC_OR_DEPARTMENT_OTHER): Payer: Medicare Other | Admitting: Registered Nurse

## 2016-11-11 VITALS — BP 113/54 | HR 82

## 2016-11-11 DIAGNOSIS — F411 Generalized anxiety disorder: Secondary | ICD-10-CM | POA: Diagnosis not present

## 2016-11-11 DIAGNOSIS — S066X3S Traumatic subarachnoid hemorrhage with loss of consciousness of 1 hour to 5 hours 59 minutes, sequela: Secondary | ICD-10-CM | POA: Diagnosis not present

## 2016-11-11 DIAGNOSIS — G894 Chronic pain syndrome: Secondary | ICD-10-CM

## 2016-11-11 DIAGNOSIS — Z5181 Encounter for therapeutic drug level monitoring: Secondary | ICD-10-CM | POA: Diagnosis not present

## 2016-11-11 DIAGNOSIS — F329 Major depressive disorder, single episode, unspecified: Secondary | ICD-10-CM

## 2016-11-11 DIAGNOSIS — M19172 Post-traumatic osteoarthritis, left ankle and foot: Secondary | ICD-10-CM

## 2016-11-11 DIAGNOSIS — G5712 Meralgia paresthetica, left lower limb: Secondary | ICD-10-CM | POA: Diagnosis not present

## 2016-11-11 DIAGNOSIS — S8412XS Injury of peroneal nerve at lower leg level, left leg, sequela: Secondary | ICD-10-CM | POA: Diagnosis not present

## 2016-11-11 DIAGNOSIS — Z79899 Other long term (current) drug therapy: Secondary | ICD-10-CM | POA: Diagnosis not present

## 2016-11-11 MED ORDER — OXYCODONE ER 9 MG PO C12A: 9.0000 mg | EXTENDED_RELEASE_CAPSULE | Freq: Two times a day (BID) | ORAL | 0 refills | Status: DC

## 2016-11-11 MED ORDER — OXYCODONE HCL 10 MG PO TABS: 10.0000 mg | ORAL_TABLET | Freq: Three times a day (TID) | ORAL | 0 refills | Status: DC | PRN
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
## 2016-11-13 ENCOUNTER — Ambulatory Visit
Admission: RE | Admit: 2016-11-13 | Discharge: 2016-11-13 | Disposition: A | Discharge status: Home / Self Care | Payer: Medicare Other | Source: Ambulatory Visit | Attending: Student | Admitting: Student

## 2016-11-13 ENCOUNTER — Telehealth: Payer: Self-pay | Admitting: Registered Nurse

## 2016-11-13 DIAGNOSIS — M545 Low back pain, unspecified: Secondary | ICD-10-CM

## 2016-11-13 MED ORDER — GADOBENATE DIMEGLUMINE 529 MG/ML IV SOLN
20.0000 mL | Freq: Once | INTRAVENOUS | Status: AC | PRN
  Administered 2016-11-13: 20 mL via INTRAVENOUS

## 2016-11-13 NOTE — Telephone Encounter (Signed)
On 11/13/2016 the [name] was reviewed no conflict was seen on the [place] with multiple prescribers. Katherine [name] has a signed narcotic contract with our office. If there were any discrepancies this would have been reported to her physician.

## 2016-11-26 ENCOUNTER — Encounter: Payer: Self-pay | Admitting: Psychology
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
## 2016-11-29 IMAGING — MR MR LUMBAR SPINE W/O CM
4 of 5 series · 26 of 48 positions shown · non-contrast
Comparison: CT of the abdomen and pelvis 10/28/2011.

CLINICAL DATA: Low back pain and bilateral lower extremity pain.
Lumbar radiculopathy.

EXAM:
MRI LUMBAR SPINE WITHOUT CONTRAST
TECHNIQUE: Multiplanar, multisequence MR imaging of the lumbar spine was
performed. No intravenous contrast was administered.

[Series 3: T1 · sagittal · 4.0mm · 0.55mm/px · 5 of 13 slices shown (1 of 2)]
[im 1/13]
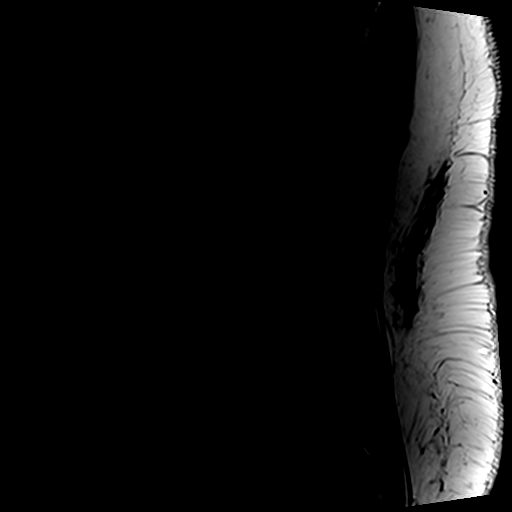
[im 4/13]
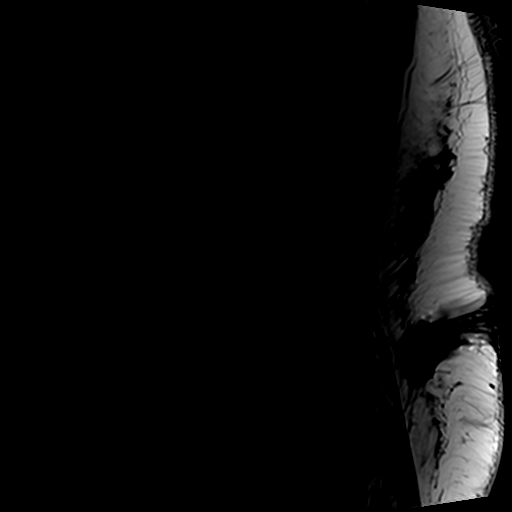
[im 7/13]
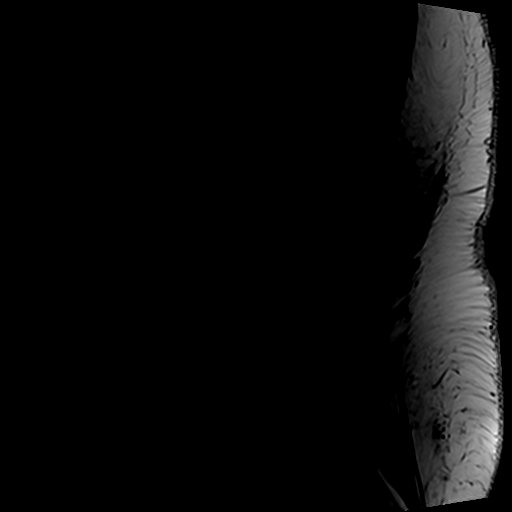
[im 10/13]
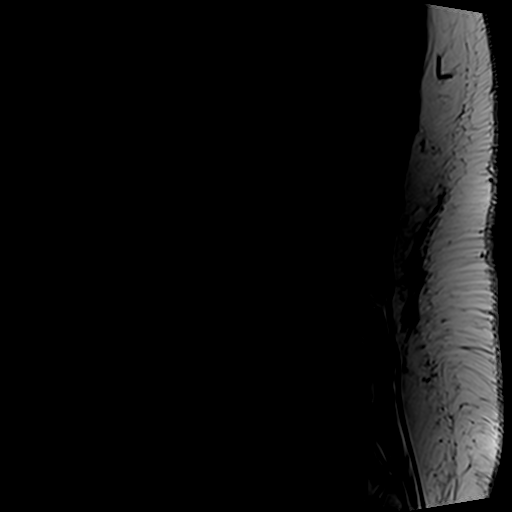
[im 13/13]
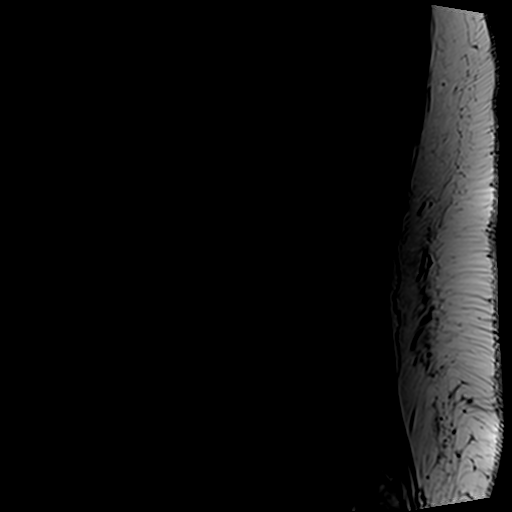

[Series 5: T2 post-contrast · sagittal · 4.0mm · 0.55mm/px · 6 of 13 slices shown]
[im 1/13]
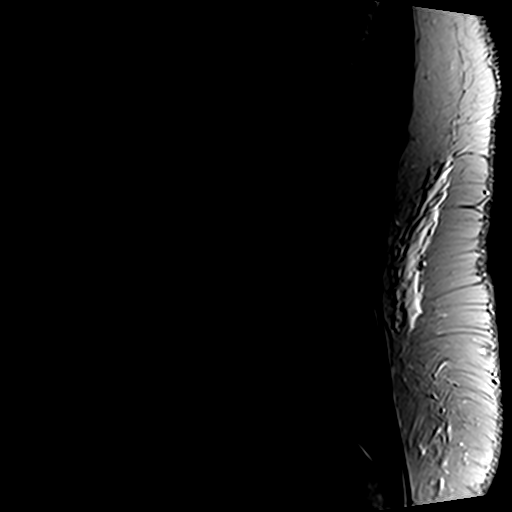
[im 3/13]
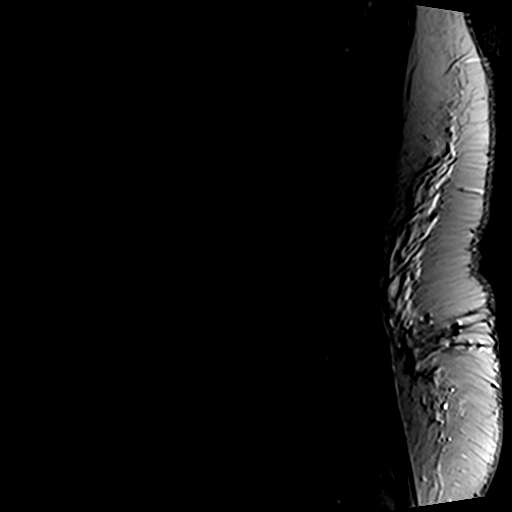
[im 5/13]
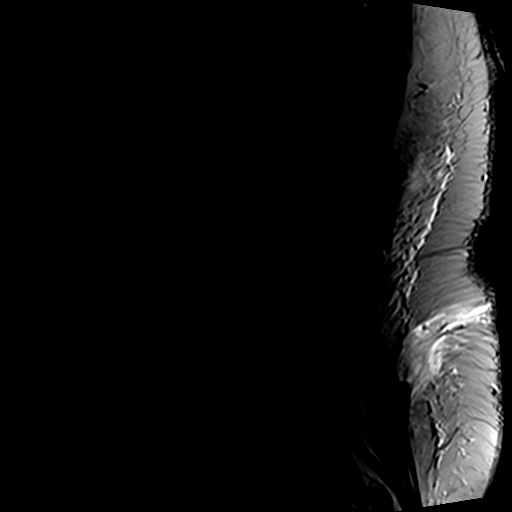
[im 8/13]
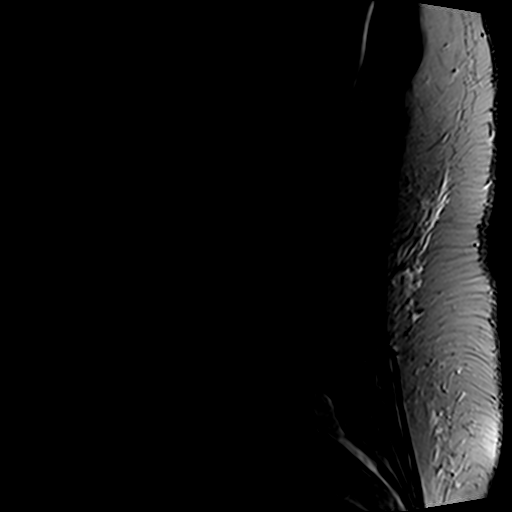
[im 10/13]
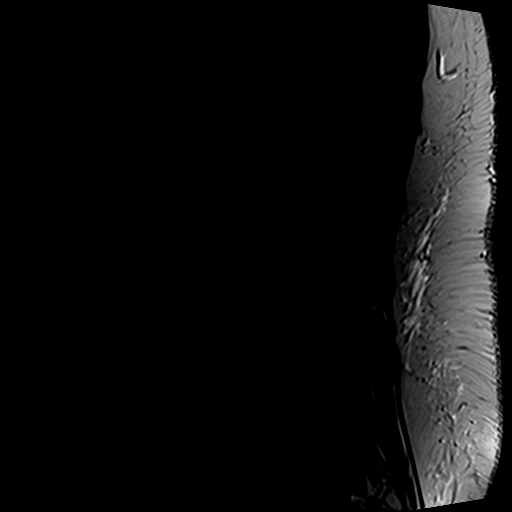
[im 13/13]
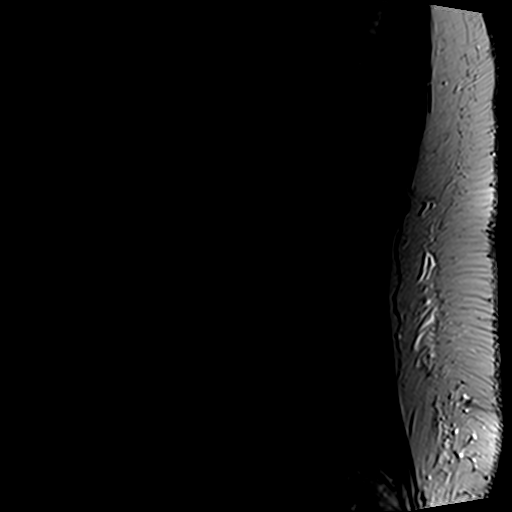

[Series 6: T2 · axial · 4.0mm · 0.70mm/px · z∈[-119,+93]mm · 10 of 36 slices shown]
[im 3/36]
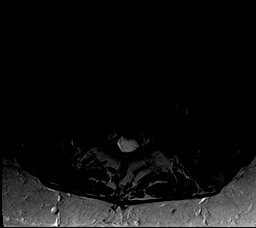
[im 5/36]
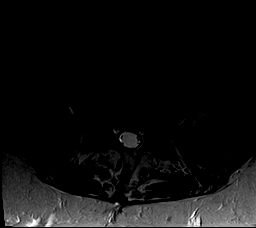
[im 8/36]
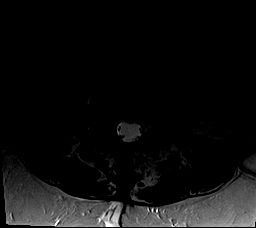
[im 12/36]
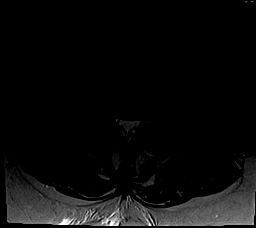
[im 17/36]
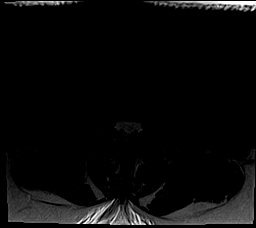
[im 19/36]
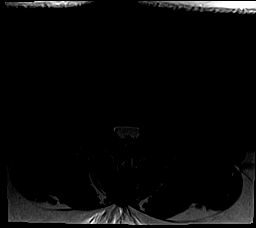
[im 22/36]
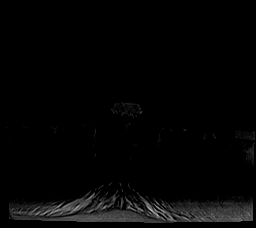
[im 26/36]
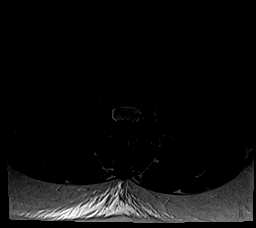
[im 31/36]
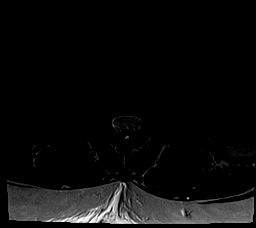
[im 36/36]
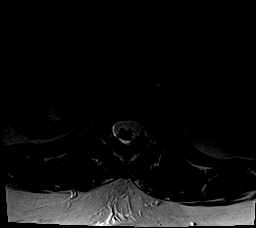

[Series 7: T1 · axial · 4.0mm · 0.35mm/px · z∈[-121,+50]mm · 5 of 36 slices shown (2 of 2)]
[im 3/36]
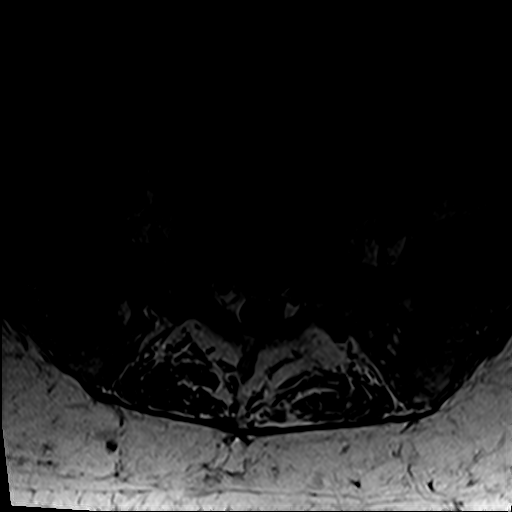
[im 5/36]
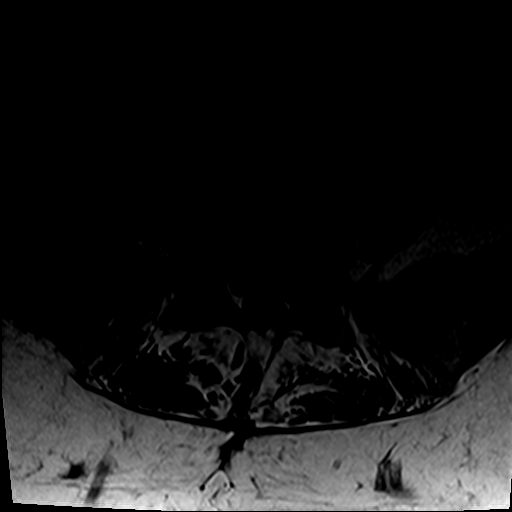
[im 8/36]
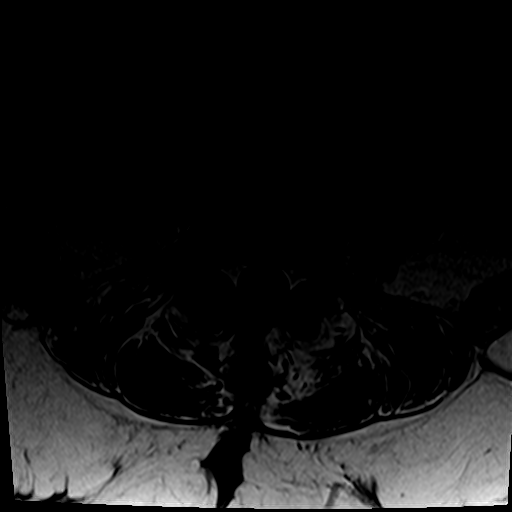
[im 19/36]
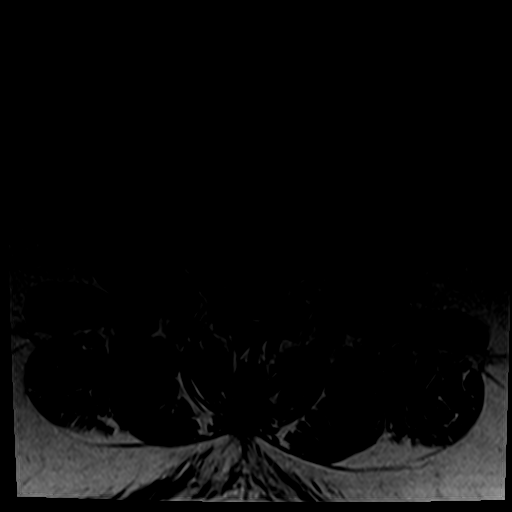
[im 31/36]
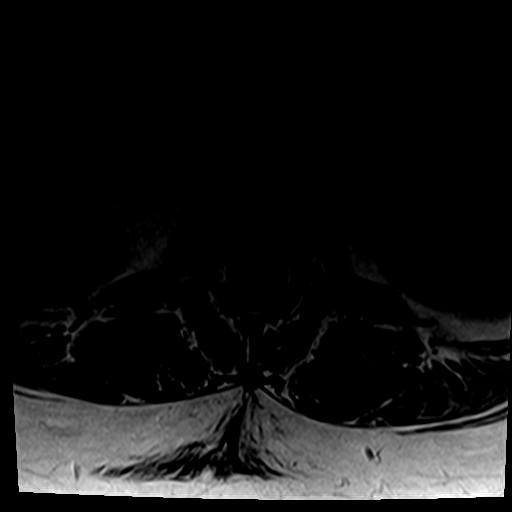

[26 of 48 positions shown; findings below may reference images not displayed]

FINDINGS: Normal signal is present in the conus medullaris which terminates at
L1-2. Mild endplate marrow changes are present at L5-S1. Marrow
signal, vertebral body heights, and alignment are otherwise normal.

T12-L1: Minimal disc bulging is present without significant
stenosis.

L1-2:  Negative.

L2-3:  Negative.

L3-4: A mild disc bulge and facet hypertrophy is present. There is
no significant stenosis.

L4-5: A mild broad-based disc protrusion is present. Mild facet
hypertrophy is evident. There some distortion of the central canal.
No significant stenosis is present.

L5-S1: Chronic loss of disc height is present. A broad-based disc
protrusion is noted. Mild facet hypertrophy is worse on the right.
Mild subarticular narrowing is present. There is mild right
foraminal narrowing.
IMPRESSION: 1. Mild subarticular and right foraminal stenosis at L5-S1 with
chronic loss of disc height.
2. Broad-based disc protrusion and mild facet hypertrophy at L4-5
without significant stenosis.

## 2016-12-01 ENCOUNTER — Ambulatory Visit
Admission: RE | Admit: 2016-12-01 | Discharge: 2016-12-01 | Disposition: A | Discharge status: Home / Self Care | Payer: Medicare Other | Source: Ambulatory Visit | Attending: Family Medicine | Admitting: Family Medicine

## 2016-12-01 ENCOUNTER — Other Ambulatory Visit: Payer: Self-pay | Admitting: Family Medicine

## 2016-12-01 DIAGNOSIS — R05 Cough: Secondary | ICD-10-CM

## 2016-12-01 DIAGNOSIS — R059 Cough, unspecified: Secondary | ICD-10-CM

## 2016-12-15 ENCOUNTER — Encounter: Payer: Medicare Other | Admitting: Psychology

## 2017-01-06 ENCOUNTER — Other Ambulatory Visit: Payer: Self-pay | Admitting: Physical Medicine & Rehabilitation

## 2017-01-06 NOTE — Telephone Encounter (Signed)
Recieved electronic medication refill request for levothyroxine, since this is a thyroid medication, should the patent be seeing her primary for refills and monitoring of this medication, unsure to refill for this clinic, please advise

## 2017-01-07 ENCOUNTER — Encounter: Payer: Medicare Other | Attending: Registered Nurse | Admitting: Physical Medicine & Rehabilitation

## 2017-01-07 ENCOUNTER — Encounter: Payer: Self-pay | Admitting: Physical Medicine & Rehabilitation

## 2017-01-07 DIAGNOSIS — S8412XS Injury of peroneal nerve at lower leg level, left leg, sequela: Secondary | ICD-10-CM | POA: Diagnosis not present

## 2017-01-07 DIAGNOSIS — Z5181 Encounter for therapeutic drug level monitoring: Secondary | ICD-10-CM | POA: Insufficient documentation

## 2017-01-07 DIAGNOSIS — G894 Chronic pain syndrome: Secondary | ICD-10-CM | POA: Insufficient documentation

## 2017-01-07 DIAGNOSIS — Z79899 Other long term (current) drug therapy: Secondary | ICD-10-CM | POA: Insufficient documentation

## 2017-01-07 DIAGNOSIS — G5712 Meralgia paresthetica, left lower limb: Secondary | ICD-10-CM

## 2017-01-07 MED ORDER — OXYCODONE HCL 10 MG PO TABS: 10.0000 mg | ORAL_TABLET | Freq: Three times a day (TID) | ORAL | 0 refills | Status: DC | PRN

## 2017-01-07 MED ORDER — OXYCODONE ER 9 MG PO C12A: 9.0000 mg | EXTENDED_RELEASE_CAPSULE | Freq: Two times a day (BID) | ORAL | 0 refills | Status: DC
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
## 2017-01-07 NOTE — Patient Instructions (Signed)
PLEASE FEEL FREE TO CALL OUR OFFICE WITH ANY PROBLEMS OR QUESTIONS ([phone])      

## 2017-01-17 ENCOUNTER — Other Ambulatory Visit: Payer: Self-pay | Admitting: Physical Medicine & Rehabilitation

## 2017-02-03 ENCOUNTER — Ambulatory Visit: Payer: Medicare Other | Admitting: Registered Nurse

## 2017-02-04 ENCOUNTER — Ambulatory Visit: Payer: Medicare Other | Admitting: Registered Nurse

## 2017-02-11 ENCOUNTER — Encounter: Payer: Medicare Other | Admitting: Registered Nurse

## 2017-02-16 ENCOUNTER — Encounter: Payer: Self-pay | Admitting: Registered Nurse

## 2017-02-16 ENCOUNTER — Encounter: Payer: Medicare Other | Attending: Registered Nurse | Admitting: Registered Nurse

## 2017-02-16 ENCOUNTER — Ambulatory Visit: Payer: Medicare Other | Admitting: Psychology

## 2017-02-16 ENCOUNTER — Other Ambulatory Visit: Payer: Self-pay

## 2017-02-16 VITALS — BP 107/71 | HR 84

## 2017-02-16 DIAGNOSIS — M19172 Post-traumatic osteoarthritis, left ankle and foot: Secondary | ICD-10-CM

## 2017-02-16 DIAGNOSIS — F329 Major depressive disorder, single episode, unspecified: Secondary | ICD-10-CM

## 2017-02-16 DIAGNOSIS — F411 Generalized anxiety disorder: Secondary | ICD-10-CM | POA: Diagnosis not present

## 2017-02-16 DIAGNOSIS — G5712 Meralgia paresthetica, left lower limb: Secondary | ICD-10-CM | POA: Diagnosis not present

## 2017-02-16 DIAGNOSIS — S8412XS Injury of peroneal nerve at lower leg level, left leg, sequela: Secondary | ICD-10-CM

## 2017-02-16 DIAGNOSIS — Z5181 Encounter for therapeutic drug level monitoring: Secondary | ICD-10-CM | POA: Diagnosis present

## 2017-02-16 DIAGNOSIS — Z79899 Other long term (current) drug therapy: Secondary | ICD-10-CM

## 2017-02-16 DIAGNOSIS — G894 Chronic pain syndrome: Secondary | ICD-10-CM | POA: Diagnosis not present

## 2017-02-16 MED ORDER — OXYCODONE HCL 10 MG PO TABS: 10.0000 mg | ORAL_TABLET | Freq: Three times a day (TID) | ORAL | 0 refills | Status: DC | PRN

## 2017-02-16 MED ORDER — OXYCODONE ER 9 MG PO C12A: 9.0000 mg | EXTENDED_RELEASE_CAPSULE | Freq: Two times a day (BID) | ORAL | 0 refills | Status: DC
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
## 2017-02-23 ENCOUNTER — Other Ambulatory Visit: Payer: Self-pay | Admitting: Physical Medicine & Rehabilitation

## 2017-02-23 DIAGNOSIS — M19172 Post-traumatic osteoarthritis, left ankle and foot: Secondary | ICD-10-CM

## 2017-02-23 DIAGNOSIS — S8412XS Injury of peroneal nerve at lower leg level, left leg, sequela: Secondary | ICD-10-CM

## 2017-02-23 DIAGNOSIS — G5710 Meralgia paresthetica, unspecified lower limb: Secondary | ICD-10-CM

## 2017-03-12 ENCOUNTER — Encounter: Payer: Self-pay | Admitting: Registered Nurse

## 2017-03-12 ENCOUNTER — Encounter: Payer: Medicare Other | Attending: Registered Nurse | Admitting: Registered Nurse

## 2017-03-12 ENCOUNTER — Other Ambulatory Visit: Payer: Self-pay

## 2017-03-12 VITALS — BP 101/69 | HR 87

## 2017-03-12 DIAGNOSIS — G5712 Meralgia paresthetica, left lower limb: Secondary | ICD-10-CM | POA: Diagnosis not present

## 2017-03-12 DIAGNOSIS — M1711 Unilateral primary osteoarthritis, right knee: Secondary | ICD-10-CM

## 2017-03-12 DIAGNOSIS — F411 Generalized anxiety disorder: Secondary | ICD-10-CM

## 2017-03-12 DIAGNOSIS — G894 Chronic pain syndrome: Secondary | ICD-10-CM

## 2017-03-12 DIAGNOSIS — S8412XS Injury of peroneal nerve at lower leg level, left leg, sequela: Secondary | ICD-10-CM

## 2017-03-12 DIAGNOSIS — M19172 Post-traumatic osteoarthritis, left ankle and foot: Secondary | ICD-10-CM | POA: Diagnosis not present

## 2017-03-12 DIAGNOSIS — Z79899 Other long term (current) drug therapy: Secondary | ICD-10-CM | POA: Diagnosis present

## 2017-03-12 DIAGNOSIS — G8929 Other chronic pain: Secondary | ICD-10-CM | POA: Diagnosis not present

## 2017-03-12 DIAGNOSIS — Z5181 Encounter for therapeutic drug level monitoring: Secondary | ICD-10-CM | POA: Diagnosis present

## 2017-03-12 DIAGNOSIS — M545 Low back pain, unspecified: Secondary | ICD-10-CM

## 2017-03-12 DIAGNOSIS — F329 Major depressive disorder, single episode, unspecified: Secondary | ICD-10-CM

## 2017-03-12 MED ORDER — OXYCODONE ER 9 MG PO C12A: 9.0000 mg | EXTENDED_RELEASE_CAPSULE | Freq: Two times a day (BID) | ORAL | 0 refills | Status: DC

## 2017-03-12 MED ORDER — OXYCODONE HCL 10 MG PO TABS: 10.0000 mg | ORAL_TABLET | Freq: Three times a day (TID) | ORAL | 0 refills | Status: DC | PRN
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
## 2017-04-09 ENCOUNTER — Other Ambulatory Visit: Payer: Self-pay | Admitting: Physical Medicine & Rehabilitation

## 2017-04-09 DIAGNOSIS — F329 Major depressive disorder, single episode, unspecified: Secondary | ICD-10-CM

## 2017-04-09 DIAGNOSIS — G894 Chronic pain syndrome: Secondary | ICD-10-CM

## 2017-04-15 ENCOUNTER — Encounter: Payer: Medicare Other | Attending: Registered Nurse | Admitting: Registered Nurse

## 2017-04-15 ENCOUNTER — Encounter: Payer: Self-pay | Admitting: Registered Nurse

## 2017-04-15 VITALS — BP 110/73 | HR 82

## 2017-04-15 DIAGNOSIS — F411 Generalized anxiety disorder: Secondary | ICD-10-CM | POA: Diagnosis not present

## 2017-04-15 DIAGNOSIS — Z5181 Encounter for therapeutic drug level monitoring: Secondary | ICD-10-CM | POA: Diagnosis not present

## 2017-04-15 DIAGNOSIS — M19172 Post-traumatic osteoarthritis, left ankle and foot: Secondary | ICD-10-CM | POA: Diagnosis not present

## 2017-04-15 DIAGNOSIS — S8412XS Injury of peroneal nerve at lower leg level, left leg, sequela: Secondary | ICD-10-CM | POA: Diagnosis not present

## 2017-04-15 DIAGNOSIS — M545 Low back pain: Secondary | ICD-10-CM

## 2017-04-15 DIAGNOSIS — G894 Chronic pain syndrome: Secondary | ICD-10-CM | POA: Insufficient documentation

## 2017-04-15 DIAGNOSIS — G8929 Other chronic pain: Secondary | ICD-10-CM | POA: Diagnosis not present

## 2017-04-15 DIAGNOSIS — F329 Major depressive disorder, single episode, unspecified: Secondary | ICD-10-CM | POA: Diagnosis not present

## 2017-04-15 DIAGNOSIS — Z79899 Other long term (current) drug therapy: Secondary | ICD-10-CM | POA: Insufficient documentation

## 2017-04-15 DIAGNOSIS — G5712 Meralgia paresthetica, left lower limb: Secondary | ICD-10-CM | POA: Diagnosis not present

## 2017-04-15 MED ORDER — OXYCODONE ER 9 MG PO C12A: 9.0000 mg | EXTENDED_RELEASE_CAPSULE | Freq: Two times a day (BID) | ORAL | 0 refills | Status: DC

## 2017-04-15 MED ORDER — OXYCODONE HCL 10 MG PO TABS: 10.0000 mg | ORAL_TABLET | Freq: Two times a day (BID) | ORAL | 0 refills | Status: DC | PRN
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
## 2017-04-20 ENCOUNTER — Telehealth: Payer: Self-pay | Admitting: *Deleted

## 2017-04-20 LAB — TOXASSURE SELECT,+ANTIDEPR,UR

## 2017-04-20 NOTE — Telephone Encounter (Signed)
Urine drug screen for this encounter is consistent for prescribed medication 

## 2017-05-13 ENCOUNTER — Encounter: Payer: Self-pay | Admitting: Registered Nurse

## 2017-05-13 ENCOUNTER — Other Ambulatory Visit: Payer: Self-pay | Admitting: Family Medicine

## 2017-05-13 ENCOUNTER — Ambulatory Visit
Admission: RE | Admit: 2017-05-13 | Discharge: 2017-05-13 | Disposition: A | Discharge status: Home / Self Care | Payer: Medicare Other | Source: Ambulatory Visit | Attending: Family Medicine | Admitting: Family Medicine

## 2017-05-13 ENCOUNTER — Encounter: Payer: Medicare Other | Attending: Registered Nurse | Admitting: Registered Nurse

## 2017-05-13 VITALS — BP 111/73 | HR 85

## 2017-05-13 DIAGNOSIS — G5712 Meralgia paresthetica, left lower limb: Secondary | ICD-10-CM

## 2017-05-13 DIAGNOSIS — M545 Low back pain, unspecified: Secondary | ICD-10-CM

## 2017-05-13 DIAGNOSIS — G8929 Other chronic pain: Secondary | ICD-10-CM | POA: Diagnosis not present

## 2017-05-13 DIAGNOSIS — Z79899 Other long term (current) drug therapy: Secondary | ICD-10-CM | POA: Diagnosis present

## 2017-05-13 DIAGNOSIS — R109 Unspecified abdominal pain: Secondary | ICD-10-CM

## 2017-05-13 DIAGNOSIS — Z5181 Encounter for therapeutic drug level monitoring: Secondary | ICD-10-CM | POA: Diagnosis present

## 2017-05-13 DIAGNOSIS — M19172 Post-traumatic osteoarthritis, left ankle and foot: Secondary | ICD-10-CM

## 2017-05-13 DIAGNOSIS — S8412XS Injury of peroneal nerve at lower leg level, left leg, sequela: Secondary | ICD-10-CM | POA: Diagnosis not present

## 2017-05-13 DIAGNOSIS — G894 Chronic pain syndrome: Secondary | ICD-10-CM | POA: Diagnosis not present

## 2017-05-13 MED ORDER — OXYCODONE ER 9 MG PO C12A: 9.0000 mg | EXTENDED_RELEASE_CAPSULE | Freq: Two times a day (BID) | ORAL | 0 refills | Status: DC

## 2017-05-13 MED ORDER — OXYCODONE HCL 10 MG PO TABS: 10.0000 mg | ORAL_TABLET | Freq: Two times a day (BID) | ORAL | 0 refills | Status: DC | PRN

## 2017-05-13 MED ORDER — LEVOTHYROXINE SODIUM 25 MCG PO TABS: 25.0000 ug | ORAL_TABLET | Freq: Every day | ORAL | 0 refills | Status: DC
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
## 2017-05-20 ENCOUNTER — Encounter: Payer: Self-pay | Admitting: Neurology

## 2017-05-20 ENCOUNTER — Telehealth: Payer: Self-pay | Admitting: Neurology

## 2017-05-20 ENCOUNTER — Ambulatory Visit: Payer: Medicare Other | Admitting: Neurology

## 2017-05-20 VITALS — BP 96/62 | HR 83 | Ht 66.0 in | Wt 278.5 lb

## 2017-05-20 DIAGNOSIS — G253 Myoclonus: Secondary | ICD-10-CM

## 2017-05-20 DIAGNOSIS — R404 Transient alteration of awareness: Secondary | ICD-10-CM | POA: Diagnosis not present

## 2017-05-20 NOTE — Telephone Encounter (Signed)
[facility] Medicare order sent to GI no auth they will reach out to the patient to schedule.
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
## 2017-05-20 NOTE — Patient Instructions (Signed)
   We will check MRI of the brain and get an EEG study. Do not drive a car until further notice.

## 2017-05-21 ENCOUNTER — Telehealth: Payer: Self-pay | Admitting: Neurology

## 2017-05-21 LAB — COMPREHENSIVE METABOLIC PANEL
A/G RATIO: 0.8 — AB (ref 1.2–2.2)
ALBUMIN: 2.8 g/dL — AB (ref 3.6–4.8)
ALT: 43 IU/L — ABNORMAL HIGH (ref 0–32)
AST: 53 IU/L — AB (ref 0–40)
Alkaline Phosphatase: 86 IU/L (ref 39–117)
BUN / CREAT RATIO: 15 (ref 12–28)
BUN: 21 mg/dL (ref 8–27)
Bilirubin Total: 0.2 mg/dL (ref 0.0–1.2)
CALCIUM: 8.5 mg/dL — AB (ref 8.7–10.3)
CO2: 23 mmol/L (ref 20–29)
CREATININE: 1.38 mg/dL — AB (ref 0.57–1.00)
Chloride: 103 mmol/L (ref 96–106)
GFR, EST AFRICAN AMERICAN: 48 mL/min/{1.73_m2} — AB (ref >59)
GFR, EST NON AFRICAN AMERICAN: 41 mL/min/{1.73_m2} — AB (ref >59)
GLOBULIN, TOTAL: 3.5 g/dL (ref 1.5–4.5)
Glucose: 108 mg/dL — ABNORMAL HIGH (ref 65–99)
POTASSIUM: 3.9 mmol/L (ref 3.5–5.2)
SODIUM: 140 mmol/L (ref 134–144)
TOTAL PROTEIN: 6.3 g/dL (ref 6.0–8.5)

## 2017-05-21 LAB — AMMONIA: AMMONIA: 123 ug/dL — AB (ref 19–87)

## 2017-05-21 LAB — SEDIMENTATION RATE: Sed Rate: 80 mm/hr — ABNORMAL HIGH (ref 0–40)

## 2017-05-21 NOTE — Telephone Encounter (Signed)
I called the patient.  The blood work is abnormal.  The patient appears to have evidence of developing renal insufficiency, the liver enzymes are elevated, the sedimentation rate is elevated at 80.  The patient appears to have an elevation in the ammonia level.  It appears that the patient does have hepatic dysfunction, the etiology is not clear.  I will send the blood work to the primary care physician, the patient will call her primary doctor concerning this.  The patient likely will need further workup for her hepatic dysfunction.

## 2017-05-30 ENCOUNTER — Ambulatory Visit
Admission: RE | Admit: 2017-05-30 | Discharge: 2017-05-30 | Disposition: A | Discharge status: Home / Self Care | Payer: Medicare Other | Source: Ambulatory Visit | Attending: Neurology | Admitting: Neurology

## 2017-05-30 DIAGNOSIS — G253 Myoclonus: Secondary | ICD-10-CM

## 2017-05-30 DIAGNOSIS — R404 Transient alteration of awareness: Secondary | ICD-10-CM

## 2017-06-01 ENCOUNTER — Telehealth: Payer: Self-pay | Admitting: Neurology

## 2017-06-01 DIAGNOSIS — R404 Transient alteration of awareness: Secondary | ICD-10-CM

## 2017-06-01 NOTE — Telephone Encounter (Signed)
I called the patient.  MRI of the brain is essentially normal.  The patient has had an occasional twitching episode, no episodes of altered mental status.  EEG apparently has not been scheduled until May, I will try to get this done at [facility] to get it done sooner.   MRI brain 06/01/17:  IMPRESSION: This MRI of the brain without contrast shows the following: 1.  The brain appears normal for age.  2.  Very mild left frontal and left ethmoid chronic sinusitis.   3.  There are no acute findings.Marland Kitchen

## 2017-06-04 ENCOUNTER — Other Ambulatory Visit: Payer: Self-pay | Admitting: Neurology

## 2017-06-04 DIAGNOSIS — R404 Transient alteration of awareness: Secondary | ICD-10-CM

## 2017-06-08 ENCOUNTER — Ambulatory Visit (HOSPITAL_COMMUNITY)
Admission: RE | Admit: 2017-06-08 | Discharge: 2017-06-08 | Disposition: A | Discharge status: Home / Self Care | Payer: Medicare Other | Source: Ambulatory Visit | Attending: Neurology | Admitting: Neurology

## 2017-06-08 ENCOUNTER — Telehealth: Payer: Self-pay | Admitting: Neurology

## 2017-06-08 DIAGNOSIS — Z79899 Other long term (current) drug therapy: Secondary | ICD-10-CM | POA: Insufficient documentation

## 2017-06-08 DIAGNOSIS — R404 Transient alteration of awareness: Secondary | ICD-10-CM | POA: Diagnosis not present

## 2017-06-08 NOTE — Procedures (Signed)
ELECTROENCEPHALOGRAM REPORT  Date of Study: 06/08/2017  Patient's Name: Katherine [name] MRN: [id number] Date of Birth: [birth year]  Referring Provider: Dr. Margette Fast  Clinical History: This is a 62 year old woman with body twitching/jerks.  Medications: Neurontin  Technical Summary: A multichannel digital EEG recording measured by the international 10-20 system with electrodes applied with paste and impedances below 5000 ohms performed in our laboratory with EKG monitoring in an awake and asleep patient.  Hyperventilation and photic stimulation were performed.  The digital EEG was referentially recorded, reformatted, and digitally filtered in a variety of bipolar and referential montages for optimal display.    Description: The patient is awake and asleep during the recording.  During maximal wakefulness, there is a symmetric, medium voltage 8 Hz posterior dominant rhythm that attenuates with eye opening.  The record is symmetric.  During drowsiness and sleep, there is an increase in theta slowing of the background with shifting asymmetry over the bilateral temporal regions, at times sharply contoured over the left temporal region without clear epileptogenic potential.  Vertex waves and symmetric sleep spindles were seen.  Hyperventilation and photic stimulation did not elicit any abnormalities.  There were no epileptiform discharges or electrographic seizures seen.    EKG lead was unremarkable.  Impression: This awake and asleep EEG is within normal limits for age.  Clinical Correlation: A normal EEG does not exclude a clinical diagnosis of epilepsy. Clinical correlation is advised.   Ellouise Newer, M.D.

## 2017-06-08 NOTE — Progress Notes (Signed)
EEG completed; results pending.    

## 2017-06-08 NOTE — Telephone Encounter (Signed)
I called the patient.  The EEG study was normal.  MRI of the brain was also unrevealing.  The patient has only had one myoclonic jerk recently.  She will follow-up in June.  EEG 06/08/17:  Impression: This awake and asleep EEG is within normal limits for age.  Clinical Correlation: A normal EEG does not exclude a clinical diagnosis of epilepsy.  Clinical correlation is advised.

## 2017-06-09 ENCOUNTER — Telehealth: Payer: Self-pay | Admitting: Neurology

## 2017-06-09 NOTE — Telephone Encounter (Signed)
I called the patient.  I indicated that myoclonic jerk is just a twitch, it does not indicate a seizure oftentimes.  The patient however has had episodes of staring spells, she has had problems with biting the tongue or inside of her cheek at night when she sleeping.  I would prefer to watch things for several more months, I will see her in June and we will make a decision about driving then.

## 2017-06-09 NOTE — Telephone Encounter (Signed)
Pt is wanting to know if she can drive, she has not driven since she was advised by provider.  She is wanting to know what the myoclonic jerk is.  Please call to advise

## 2017-06-10 ENCOUNTER — Other Ambulatory Visit: Payer: Self-pay | Admitting: Registered Nurse

## 2017-06-11 ENCOUNTER — Encounter: Payer: Self-pay | Admitting: Registered Nurse

## 2017-06-11 ENCOUNTER — Encounter: Payer: Medicare Other | Attending: Registered Nurse | Admitting: Registered Nurse

## 2017-06-11 VITALS — BP 100/63 | HR 91 | Resp 14 | Ht 66.0 in | Wt 274.0 lb

## 2017-06-11 DIAGNOSIS — Z79899 Other long term (current) drug therapy: Secondary | ICD-10-CM | POA: Diagnosis present

## 2017-06-11 DIAGNOSIS — G894 Chronic pain syndrome: Secondary | ICD-10-CM | POA: Diagnosis present

## 2017-06-11 DIAGNOSIS — S8412XS Injury of peroneal nerve at lower leg level, left leg, sequela: Secondary | ICD-10-CM | POA: Diagnosis not present

## 2017-06-11 DIAGNOSIS — M1711 Unilateral primary osteoarthritis, right knee: Secondary | ICD-10-CM

## 2017-06-11 DIAGNOSIS — Z5181 Encounter for therapeutic drug level monitoring: Secondary | ICD-10-CM | POA: Diagnosis present

## 2017-06-11 DIAGNOSIS — G5712 Meralgia paresthetica, left lower limb: Secondary | ICD-10-CM | POA: Diagnosis not present

## 2017-06-11 MED ORDER — OXYCODONE HCL 10 MG PO TABS
10.0000 mg | ORAL_TABLET | Freq: Two times a day (BID) | ORAL | 0 refills | Status: DC | PRN
Start: 2017-06-11 — End: 2017-07-13

## 2017-06-11 NOTE — Patient Instructions (Addendum)
Start the [employer] on 06/15/2017. On 06/15/2017 only take Xtampza one tablet per day  On 06/17/18/19 Take Xtampza 9 mg every 12 hours  On 06/17/2017 Take Xtampza one tablet per day   And continue to Alternate.Call office in two weeks to evaluate  Medication Change: Call on   06/22/2017.  If you have any questions call office [phone]
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
## 2017-06-23 ENCOUNTER — Telehealth: Payer: Self-pay | Admitting: *Deleted

## 2017-06-23 NOTE — Telephone Encounter (Signed)
Patient called back as instructed to get her next set of instructions for her wean down schedule regarding pain medication

## 2017-06-24 NOTE — Telephone Encounter (Signed)
Return Ms. Vankirk call, no answer. Left message to return the call.

## 2017-06-25 NOTE — Telephone Encounter (Signed)
Patient left a message, returned call, asking for [name] to call her back

## 2017-06-26 NOTE — Telephone Encounter (Addendum)
Return Katherine [name] call, she has tolerated the slow weaning of Xtampza, she has been alternating with BID/ Daily dosing. She reports her last Xtampza dose was on Tuesday 06/23/2017. She denies any nausea, she had one bout of diarrhea she states on Wednesday 06/24/2017. Also reports she has become more active and on 06/25/2017 her pain increased in intensity and admits to taking an  Extra dose of Oxycodone.  She was asked to count her Oxycodone tablets she states she has 41 tablets, she picked up her Oxycodone on 06/11/2017, per PMP Aware Web-site. The goal for Katherine [name] will be to take her Oxycodone twice a day, she was given permission to take an extra Oxycodone when pain is severe no more than three a day, she verbalizes understanding. Katherine [name] goal is to be able to be taper off the oxycodone as well she reports. She was instructed to call office next week to evaluate. Instructed to keep pain journal log, she verbalizes understanding.

## 2017-06-26 NOTE — Addendum Note (Signed)
Addended by: Bayard Hugger on: 06/26/2017 11:05 AM   Modules accepted: Orders

## 2017-07-01 ENCOUNTER — Ambulatory Visit: Payer: Medicare Other | Admitting: Neurology

## 2017-07-01 ENCOUNTER — Encounter

## 2017-07-01 ENCOUNTER — Telehealth: Payer: Self-pay

## 2017-07-01 NOTE — Telephone Encounter (Signed)
Return Katherine [name], she states on 06/30/17 and one day last week her pain was intense and she had an extra Oxycodone . We will allow her to take an extra Oxycodone when pain is severe no more than three tablets a day, she verbalizes understanding. Also reports at this time she has 28 tablets of Oxycodone.  She has weaned off the [place].  Placed a [name] to [place], she picked up her Oxycodone on 06/15/17. Ms. Langone instructed to [name] office on Monday 07/06/2017 with medication log, she verbalizes understanding.

## 2017-07-01 NOTE — Telephone Encounter (Signed)
Patient called, stated was instructed to call back today and inform you that she has taken 2 extra 8-hr pills of medication.

## 2017-07-07 ENCOUNTER — Telehealth: Payer: Self-pay | Admitting: *Deleted

## 2017-07-07 NOTE — Telephone Encounter (Signed)
Patient left a message reporting that she has only had to take an extra tablet twice.  She reports having 14 tablets left on 07/06/2017.  FYI

## 2017-07-08 ENCOUNTER — Other Ambulatory Visit: Payer: Medicare Other

## 2017-07-13 ENCOUNTER — Encounter: Payer: Medicare Other | Attending: Registered Nurse | Admitting: Physical Medicine & Rehabilitation

## 2017-07-13 ENCOUNTER — Other Ambulatory Visit: Payer: Self-pay

## 2017-07-13 ENCOUNTER — Encounter: Payer: Self-pay | Admitting: Physical Medicine & Rehabilitation

## 2017-07-13 VITALS — BP 111/68 | HR 98 | Ht 66.0 in | Wt 277.0 lb

## 2017-07-13 DIAGNOSIS — G894 Chronic pain syndrome: Secondary | ICD-10-CM | POA: Insufficient documentation

## 2017-07-13 DIAGNOSIS — S066X3S Traumatic subarachnoid hemorrhage with loss of consciousness of 1 hour to 5 hours 59 minutes, sequela: Secondary | ICD-10-CM

## 2017-07-13 DIAGNOSIS — Z5181 Encounter for therapeutic drug level monitoring: Secondary | ICD-10-CM | POA: Insufficient documentation

## 2017-07-13 DIAGNOSIS — Z79899 Other long term (current) drug therapy: Secondary | ICD-10-CM | POA: Insufficient documentation

## 2017-07-13 DIAGNOSIS — S8412XS Injury of peroneal nerve at lower leg level, left leg, sequela: Secondary | ICD-10-CM | POA: Diagnosis not present

## 2017-07-13 DIAGNOSIS — G5712 Meralgia paresthetica, left lower limb: Secondary | ICD-10-CM | POA: Diagnosis not present

## 2017-07-13 MED ORDER — GABAPENTIN 300 MG PO CAPS: 300.0000 mg | ORAL_CAPSULE | Freq: Two times a day (BID) | ORAL | 3 refills | Status: DC

## 2017-07-13 MED ORDER — OXYCODONE HCL 10 MG PO TABS: 10.0000 mg | ORAL_TABLET | Freq: Two times a day (BID) | ORAL | 0 refills | Status: DC | PRN

## 2017-07-13 NOTE — Patient Instructions (Addendum)
PLEASE FEEL FREE TO CALL OUR OFFICE WITH ANY PROBLEMS OR QUESTIONS ([phone])  Change your gabapentin to 325m twice daily.
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
## 2017-07-15 ENCOUNTER — Other Ambulatory Visit: Payer: Self-pay | Admitting: Physical Medicine & Rehabilitation

## 2017-07-15 DIAGNOSIS — M19172 Post-traumatic osteoarthritis, left ankle and foot: Secondary | ICD-10-CM

## 2017-07-15 DIAGNOSIS — M170 Bilateral primary osteoarthritis of knee: Secondary | ICD-10-CM

## 2017-07-15 DIAGNOSIS — G5712 Meralgia paresthetica, left lower limb: Secondary | ICD-10-CM

## 2017-07-23 ENCOUNTER — Telehealth: Payer: Self-pay

## 2017-07-23 NOTE — Telephone Encounter (Signed)
patient called today, stated recieved an ablasion on her back at Dr. Maryjean Ka office 1 month ago.  Stated since then her left side of her back is feeling better but her right side is now doing worse then when started.  Stated called her primary and Dr. Maryjean Ka office all has given her a run around and now is in large amounts of pain and wants to know if there is anything that can help her with that.  Please advise.

## 2017-07-28 ENCOUNTER — Other Ambulatory Visit: Payer: Self-pay | Admitting: Family Medicine

## 2017-07-28 ENCOUNTER — Ambulatory Visit
Admission: RE | Admit: 2017-07-28 | Discharge: 2017-07-28 | Disposition: A | Discharge status: Home / Self Care | Payer: Medicare Other | Source: Ambulatory Visit | Attending: Family Medicine | Admitting: Family Medicine

## 2017-07-28 DIAGNOSIS — R109 Unspecified abdominal pain: Secondary | ICD-10-CM

## 2017-07-28 NOTE — Telephone Encounter (Signed)
I am sorry that her back is hurting more after the ablation, but I am unable to help given that I don't know what was done nor did I request the procedure. Dr. Maryjean Ka should be helping her with her increased pain.

## 2017-07-29 NOTE — Telephone Encounter (Signed)
Contacted Katherine [name] and verbally relayed Dr. Charm Barges message.  Katherine [name] thought she had discussed back pain with Dr. Naaman Plummer.  She was seen by her PCP and a CT of her spine was ordered.  She has not heard about results yet.  Dr. Maryjean Ka told her that there was nothing else he could do for her. Katherine [name] advised to bring CT results to next appointment with Dr. Naaman Plummer 08/12/2017 or perhaps it will be available on EMR.  I advised Katherine [name] that if PCP recommends medication increase that she must contact our office first prior to filling any prescriptions.  She verbalized understanding

## 2017-07-30 ENCOUNTER — Other Ambulatory Visit: Payer: Self-pay | Admitting: Physical Medicine & Rehabilitation

## 2017-07-30 ENCOUNTER — Other Ambulatory Visit: Payer: Self-pay | Admitting: Family Medicine

## 2017-07-30 DIAGNOSIS — R9389 Abnormal findings on diagnostic imaging of other specified body structures: Secondary | ICD-10-CM

## 2017-07-30 DIAGNOSIS — F329 Major depressive disorder, single episode, unspecified: Secondary | ICD-10-CM

## 2017-07-30 DIAGNOSIS — G894 Chronic pain syndrome: Secondary | ICD-10-CM

## 2017-08-03 ENCOUNTER — Ambulatory Visit
Admission: RE | Admit: 2017-08-03 | Discharge: 2017-08-03 | Disposition: A | Discharge status: Home / Self Care | Payer: Medicare Other | Source: Ambulatory Visit | Attending: Family Medicine | Admitting: Family Medicine

## 2017-08-03 DIAGNOSIS — R9389 Abnormal findings on diagnostic imaging of other specified body structures: Secondary | ICD-10-CM

## 2017-08-04 ENCOUNTER — Other Ambulatory Visit: Payer: Self-pay | Admitting: Physical Medicine & Rehabilitation

## 2017-08-04 DIAGNOSIS — S8412XS Injury of peroneal nerve at lower leg level, left leg, sequela: Secondary | ICD-10-CM

## 2017-08-07 ENCOUNTER — Other Ambulatory Visit: Payer: Medicare Other

## 2017-08-12 ENCOUNTER — Encounter: Payer: Self-pay | Admitting: Physical Medicine & Rehabilitation

## 2017-08-12 ENCOUNTER — Encounter: Payer: Medicare Other | Attending: Registered Nurse | Admitting: Physical Medicine & Rehabilitation

## 2017-08-12 VITALS — BP 99/63 | HR 83 | Resp 14 | Ht 66.0 in | Wt 276.0 lb

## 2017-08-12 DIAGNOSIS — S8412XS Injury of peroneal nerve at lower leg level, left leg, sequela: Secondary | ICD-10-CM | POA: Diagnosis not present

## 2017-08-12 DIAGNOSIS — Z79899 Other long term (current) drug therapy: Secondary | ICD-10-CM | POA: Insufficient documentation

## 2017-08-12 DIAGNOSIS — M47816 Spondylosis without myelopathy or radiculopathy, lumbar region: Secondary | ICD-10-CM | POA: Diagnosis not present

## 2017-08-12 DIAGNOSIS — G5712 Meralgia paresthetica, left lower limb: Secondary | ICD-10-CM | POA: Diagnosis not present

## 2017-08-12 DIAGNOSIS — M1711 Unilateral primary osteoarthritis, right knee: Secondary | ICD-10-CM | POA: Diagnosis not present

## 2017-08-12 DIAGNOSIS — Z5181 Encounter for therapeutic drug level monitoring: Secondary | ICD-10-CM | POA: Insufficient documentation

## 2017-08-12 DIAGNOSIS — G894 Chronic pain syndrome: Secondary | ICD-10-CM | POA: Insufficient documentation

## 2017-08-12 MED ORDER — OXYCODONE HCL 10 MG PO TABS: 10.0000 mg | ORAL_TABLET | Freq: Two times a day (BID) | ORAL | 0 refills | Status: DC | PRN

## 2017-08-12 MED ORDER — METHOCARBAMOL 500 MG PO TABS: 500.0000 mg | ORAL_TABLET | Freq: Four times a day (QID) | ORAL | 2 refills | Status: DC | PRN
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
## 2017-08-12 NOTE — Patient Instructions (Addendum)
1. STOP TIZANIDINE AND REMERON  2. I NEED IMAGINE AND PROCEDURE REPORTS FROM DR. HARKEN'S OFFICE  3. LET GI DOCTOR KNOW THAT WE STOP THE MEDICINES ABOVE WHEN YOU GO SEE HIM/HER  4. YOU MAY NEED TO HOLD CYMBALTA  AS WELL. DISCUSS WITH YOUR GI DOCTOR.

## 2017-08-18 ENCOUNTER — Other Ambulatory Visit: Payer: Self-pay

## 2017-08-18 ENCOUNTER — Encounter (HOSPITAL_COMMUNITY): Payer: Self-pay

## 2017-08-18 ENCOUNTER — Inpatient Hospital Stay (HOSPITAL_COMMUNITY)
Admission: EM | Admit: 2017-08-18 | Discharge: 2017-08-20 | DRG: 378 | Disposition: A | Discharge status: Home / Self Care | Payer: Medicare Other | Attending: Internal Medicine | Admitting: Internal Medicine

## 2017-08-18 ENCOUNTER — Emergency Department (HOSPITAL_COMMUNITY): Payer: Medicare Other

## 2017-08-18 DIAGNOSIS — K7581 Nonalcoholic steatohepatitis (NASH): Secondary | ICD-10-CM | POA: Diagnosis present

## 2017-08-18 DIAGNOSIS — R195 Other fecal abnormalities: Secondary | ICD-10-CM

## 2017-08-18 DIAGNOSIS — Z79899 Other long term (current) drug therapy: Secondary | ICD-10-CM

## 2017-08-18 DIAGNOSIS — K219 Gastro-esophageal reflux disease without esophagitis: Secondary | ICD-10-CM | POA: Diagnosis present

## 2017-08-18 DIAGNOSIS — Z87891 Personal history of nicotine dependence: Secondary | ICD-10-CM

## 2017-08-18 DIAGNOSIS — Z87442 Personal history of urinary calculi: Secondary | ICD-10-CM

## 2017-08-18 DIAGNOSIS — E118 Type 2 diabetes mellitus with unspecified complications: Secondary | ICD-10-CM | POA: Diagnosis not present

## 2017-08-18 DIAGNOSIS — Z960 Presence of urogenital implants: Secondary | ICD-10-CM | POA: Diagnosis present

## 2017-08-18 DIAGNOSIS — Z8614 Personal history of Methicillin resistant Staphylococcus aureus infection: Secondary | ICD-10-CM

## 2017-08-18 DIAGNOSIS — K920 Hematemesis: Secondary | ICD-10-CM | POA: Diagnosis present

## 2017-08-18 DIAGNOSIS — K7460 Unspecified cirrhosis of liver: Secondary | ICD-10-CM | POA: Diagnosis present

## 2017-08-18 DIAGNOSIS — E1129 Type 2 diabetes mellitus with other diabetic kidney complication: Secondary | ICD-10-CM

## 2017-08-18 DIAGNOSIS — G4733 Obstructive sleep apnea (adult) (pediatric): Secondary | ICD-10-CM | POA: Diagnosis present

## 2017-08-18 DIAGNOSIS — E039 Hypothyroidism, unspecified: Secondary | ICD-10-CM | POA: Diagnosis present

## 2017-08-18 DIAGNOSIS — K922 Gastrointestinal hemorrhage, unspecified: Secondary | ICD-10-CM | POA: Diagnosis not present

## 2017-08-18 DIAGNOSIS — R1115 Cyclical vomiting syndrome unrelated to migraine: Secondary | ICD-10-CM

## 2017-08-18 DIAGNOSIS — I272 Pulmonary hypertension, unspecified: Secondary | ICD-10-CM | POA: Diagnosis present

## 2017-08-18 DIAGNOSIS — Z8249 Family history of ischemic heart disease and other diseases of the circulatory system: Secondary | ICD-10-CM

## 2017-08-18 DIAGNOSIS — G894 Chronic pain syndrome: Secondary | ICD-10-CM | POA: Diagnosis present

## 2017-08-18 DIAGNOSIS — E785 Hyperlipidemia, unspecified: Secondary | ICD-10-CM | POA: Diagnosis present

## 2017-08-18 DIAGNOSIS — G43A0 Cyclical vomiting, not intractable: Secondary | ICD-10-CM | POA: Diagnosis present

## 2017-08-18 DIAGNOSIS — M21372 Foot drop, left foot: Secondary | ICD-10-CM | POA: Diagnosis present

## 2017-08-18 DIAGNOSIS — E119 Type 2 diabetes mellitus without complications: Secondary | ICD-10-CM | POA: Diagnosis present

## 2017-08-18 DIAGNOSIS — Z8711 Personal history of peptic ulcer disease: Secondary | ICD-10-CM

## 2017-08-18 DIAGNOSIS — G8921 Chronic pain due to trauma: Secondary | ICD-10-CM | POA: Diagnosis present

## 2017-08-18 DIAGNOSIS — K224 Dyskinesia of esophagus: Secondary | ICD-10-CM | POA: Diagnosis present

## 2017-08-18 DIAGNOSIS — I82621 Acute embolism and thrombosis of deep veins of right upper extremity: Secondary | ICD-10-CM | POA: Diagnosis not present

## 2017-08-18 DIAGNOSIS — I10 Essential (primary) hypertension: Secondary | ICD-10-CM | POA: Diagnosis present

## 2017-08-18 DIAGNOSIS — Z90710 Acquired absence of both cervix and uterus: Secondary | ICD-10-CM | POA: Diagnosis not present

## 2017-08-18 DIAGNOSIS — Z885 Allergy status to narcotic agent status: Secondary | ICD-10-CM

## 2017-08-18 DIAGNOSIS — F418 Other specified anxiety disorders: Secondary | ICD-10-CM | POA: Diagnosis present

## 2017-08-18 DIAGNOSIS — Z91048 Other nonmedicinal substance allergy status: Secondary | ICD-10-CM

## 2017-08-18 DIAGNOSIS — F419 Anxiety disorder, unspecified: Secondary | ICD-10-CM | POA: Diagnosis present

## 2017-08-18 DIAGNOSIS — K921 Melena: Secondary | ICD-10-CM | POA: Diagnosis present

## 2017-08-18 DIAGNOSIS — D62 Acute posthemorrhagic anemia: Secondary | ICD-10-CM | POA: Diagnosis present

## 2017-08-18 DIAGNOSIS — Z9049 Acquired absence of other specified parts of digestive tract: Secondary | ICD-10-CM

## 2017-08-18 DIAGNOSIS — Z6841 Body Mass Index (BMI) 40.0 and over, adult: Secondary | ICD-10-CM | POA: Diagnosis not present

## 2017-08-18 DIAGNOSIS — Z794 Long term (current) use of insulin: Secondary | ICD-10-CM

## 2017-08-18 DIAGNOSIS — F329 Major depressive disorder, single episode, unspecified: Secondary | ICD-10-CM | POA: Diagnosis present

## 2017-08-18 DIAGNOSIS — R609 Edema, unspecified: Secondary | ICD-10-CM | POA: Diagnosis not present

## 2017-08-18 DIAGNOSIS — Z791 Long term (current) use of non-steroidal anti-inflammatories (NSAID): Secondary | ICD-10-CM

## 2017-08-18 DIAGNOSIS — Z79890 Hormone replacement therapy (postmenopausal): Secondary | ICD-10-CM

## 2017-08-18 LAB — TYPE AND SCREEN
ABO/RH(D): AB POS
Antibody Screen: NEGATIVE

## 2017-08-18 LAB — URINALYSIS, ROUTINE W REFLEX MICROSCOPIC
Bilirubin Urine: NEGATIVE
Glucose, UA: NEGATIVE mg/dL
Hgb urine dipstick: NEGATIVE
KETONES UR: NEGATIVE mg/dL
LEUKOCYTES UA: NEGATIVE
NITRITE: NEGATIVE
PH: 5 (ref 5.0–8.0)
PROTEIN: NEGATIVE mg/dL
Specific Gravity, Urine: 1.018 (ref 1.005–1.030)

## 2017-08-18 LAB — COMPREHENSIVE METABOLIC PANEL
ALT: 25 U/L (ref 14–54)
AST: 44 U/L — AB (ref 15–41)
Albumin: 2.9 g/dL — ABNORMAL LOW (ref 3.5–5.0)
Alkaline Phosphatase: 88 U/L (ref 38–126)
Anion gap: 7 (ref 5–15)
BUN: 23 mg/dL — ABNORMAL HIGH (ref 6–20)
CHLORIDE: 106 mmol/L (ref 101–111)
CO2: 28 mmol/L (ref 22–32)
CREATININE: 1.24 mg/dL — AB (ref 0.44–1.00)
Calcium: 9.1 mg/dL (ref 8.9–10.3)
GFR calc non Af Amer: 46 mL/min — ABNORMAL LOW (ref >60)
GFR, EST AFRICAN AMERICAN: 53 mL/min — AB (ref >60)
Glucose, Bld: 159 mg/dL — ABNORMAL HIGH (ref 65–99)
POTASSIUM: 3.6 mmol/L (ref 3.5–5.1)
SODIUM: 141 mmol/L (ref 135–145)
Total Bilirubin: 0.6 mg/dL (ref 0.3–1.2)
Total Protein: 7.2 g/dL (ref 6.5–8.1)

## 2017-08-18 LAB — CBC
HEMATOCRIT: 39.9 % (ref 36.0–46.0)
Hemoglobin: 12.4 g/dL (ref 12.0–15.0)
MCH: 26.6 pg (ref 26.0–34.0)
MCHC: 31.1 g/dL (ref 30.0–36.0)
MCV: 85.4 fL (ref 78.0–100.0)
PLATELETS: 148 10*3/uL — AB (ref 150–400)
RBC: 4.67 MIL/uL (ref 3.87–5.11)
RDW: 15.2 % (ref 11.5–15.5)
WBC: 6.9 10*3/uL (ref 4.0–10.5)

## 2017-08-18 LAB — APTT: APTT: 31 s (ref 24–36)

## 2017-08-18 LAB — PROTIME-INR
INR: 1.21
Prothrombin Time: 15.2 seconds (ref 11.4–15.2)

## 2017-08-18 LAB — ABO/RH: ABO/RH(D): AB POS

## 2017-08-18 LAB — POC OCCULT BLOOD, ED: Fecal Occult Bld: POSITIVE — AB

## 2017-08-18 LAB — AMMONIA: AMMONIA: 45 umol/L — AB (ref 9–35)

## 2017-08-18 MED ORDER — PANTOPRAZOLE SODIUM 40 MG IV SOLR
40.0000 mg | Freq: Once | INTRAVENOUS | Status: AC
  Administered 2017-08-18: 40 mg via INTRAVENOUS
  Filled 2017-08-18: qty 40

## 2017-08-18 MED ORDER — ALBUTEROL SULFATE (2.5 MG/3ML) 0.083% IN NEBU: 5.0000 mg | INHALATION_SOLUTION | Freq: Once | RESPIRATORY_TRACT | Status: DC

## 2017-08-18 MED ORDER — DIPHENHYDRAMINE HCL 50 MG/ML IJ SOLN
12.5000 mg | Freq: Three times a day (TID) | INTRAMUSCULAR | Status: DC | PRN
  Administered 2017-08-19 (×3): 12.5 mg via INTRAVENOUS
  Filled 2017-08-18 (×3): qty 1

## 2017-08-18 MED ORDER — INSULIN ASPART 100 UNIT/ML ~~LOC~~ SOLN
0.0000 [IU] | Freq: Three times a day (TID) | SUBCUTANEOUS | Status: DC
  Administered 2017-08-19: 1 [IU] via SUBCUTANEOUS

## 2017-08-18 MED ORDER — ONDANSETRON HCL 4 MG PO TABS: 4.0000 mg | ORAL_TABLET | Freq: Four times a day (QID) | ORAL | Status: DC | PRN

## 2017-08-18 MED ORDER — PANTOPRAZOLE SODIUM 40 MG IV SOLR
40.0000 mg | Freq: Once | INTRAVENOUS | Status: DC
Start: 2017-08-18 — End: 2017-08-20

## 2017-08-18 MED ORDER — ONDANSETRON HCL 4 MG/2ML IJ SOLN: 4.0000 mg | Freq: Four times a day (QID) | INTRAMUSCULAR | Status: DC | PRN

## 2017-08-18 MED ORDER — SODIUM CHLORIDE 0.9 % IV SOLN
8.0000 mg/h | INTRAVENOUS | Status: DC
  Administered 2017-08-19 – 2017-08-20 (×4): 8 mg/h via INTRAVENOUS
  Filled 2017-08-18 (×8): qty 80

## 2017-08-18 MED ORDER — LIRAGLUTIDE 18 MG/3ML ~~LOC~~ SOPN: 1.2000 mg | PEN_INJECTOR | Freq: Every day | SUBCUTANEOUS | Status: DC

## 2017-08-18 MED ORDER — SODIUM CHLORIDE 0.9 % IV BOLUS
500.0000 mL | Freq: Once | INTRAVENOUS | Status: AC
  Administered 2017-08-18: 500 mL via INTRAVENOUS

## 2017-08-18 MED ORDER — MORPHINE SULFATE (PF) 2 MG/ML IV SOLN
2.0000 mg | Freq: Three times a day (TID) | INTRAVENOUS | Status: DC | PRN
  Administered 2017-08-19 – 2017-08-20 (×4): 2 mg via INTRAVENOUS
  Filled 2017-08-18 (×4): qty 1
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
## 2017-08-18 NOTE — ED Triage Notes (Signed)
Patient reports that she has been coughing  and vomiting bright red blood x 1 day days. Patient c/o right lower abdominal pain, SOB and right lower back pain, fatigue, and itching.. Patient states she went to her PCP today and was diagnosed with Cirrhosis. Patient was told to come to the ED.

## 2017-08-19 ENCOUNTER — Other Ambulatory Visit: Payer: Self-pay

## 2017-08-19 DIAGNOSIS — G4733 Obstructive sleep apnea (adult) (pediatric): Secondary | ICD-10-CM

## 2017-08-19 DIAGNOSIS — I10 Essential (primary) hypertension: Secondary | ICD-10-CM

## 2017-08-19 DIAGNOSIS — K922 Gastrointestinal hemorrhage, unspecified: Secondary | ICD-10-CM

## 2017-08-19 DIAGNOSIS — E118 Type 2 diabetes mellitus with unspecified complications: Secondary | ICD-10-CM

## 2017-08-19 DIAGNOSIS — I272 Pulmonary hypertension, unspecified: Secondary | ICD-10-CM

## 2017-08-19 LAB — LIPID PANEL
Cholesterol: 75 mg/dL (ref 0–200)
HDL: 27 mg/dL — ABNORMAL LOW (ref >40)
LDL Cholesterol: 28 mg/dL (ref 0–99)
Total CHOL/HDL Ratio: 2.8 RATIO
Triglycerides: 102 mg/dL (ref <150)
VLDL: 20 mg/dL (ref 0–40)

## 2017-08-19 LAB — CBC
HCT: 35.5 % — ABNORMAL LOW (ref 36.0–46.0)
Hemoglobin: 11.1 g/dL — ABNORMAL LOW (ref 12.0–15.0)
MCH: 26.4 pg (ref 26.0–34.0)
MCHC: 31.3 g/dL (ref 30.0–36.0)
MCV: 84.3 fL (ref 78.0–100.0)
Platelets: 116 10*3/uL — ABNORMAL LOW (ref 150–400)
RBC: 4.21 MIL/uL (ref 3.87–5.11)
RDW: 15 % (ref 11.5–15.5)
WBC: 5.1 10*3/uL (ref 4.0–10.5)

## 2017-08-19 LAB — IRON AND TIBC
IRON: 39 ug/dL (ref 28–170)
Saturation Ratios: 10 % — ABNORMAL LOW (ref 10.4–31.8)
TIBC: 404 ug/dL (ref 250–450)
UIBC: 365 ug/dL

## 2017-08-19 LAB — GLUCOSE, CAPILLARY
GLUCOSE-CAPILLARY: 124 mg/dL — AB (ref 65–99)
GLUCOSE-CAPILLARY: 119 mg/dL — AB (ref 65–99)
GLUCOSE-CAPILLARY: 216 mg/dL — AB (ref 65–99)
Glucose-Capillary: 272 mg/dL — ABNORMAL HIGH (ref 65–99)

## 2017-08-19 LAB — HEMOGLOBIN AND HEMATOCRIT, BLOOD
HCT: 38.1 % (ref 36.0–46.0)
Hemoglobin: 11.9 g/dL — ABNORMAL LOW (ref 12.0–15.0)

## 2017-08-19 MED ORDER — OCTREOTIDE LOAD VIA INFUSION
25.0000 ug | Freq: Once | INTRAVENOUS | Status: AC
  Administered 2017-08-19: 25 ug via INTRAVENOUS
  Filled 2017-08-19: qty 13

## 2017-08-19 MED ORDER — INSULIN ASPART 100 UNIT/ML ~~LOC~~ SOLN
0.0000 [IU] | Freq: Three times a day (TID) | SUBCUTANEOUS | Status: DC
  Administered 2017-08-19: 3 [IU] via SUBCUTANEOUS

## 2017-08-19 MED ORDER — OCTREOTIDE ACETATE 500 MCG/ML IJ SOLN
50.0000 ug/h | INTRAMUSCULAR | Status: DC
  Administered 2017-08-19 – 2017-08-20 (×2): 50 ug/h via INTRAVENOUS
  Filled 2017-08-19 (×5): qty 1
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
## 2017-08-19 NOTE — Progress Notes (Signed)
   08/19/17 1653  Clinical Encounter Type  Visited With Patient and family together;Health care provider  Visit Type Initial  Referral From Patient  Consult/Referral To Chaplain  Spiritual Encounters  Spiritual Needs Literature   Responded to a SCC for HCPA.  Patient was receptive and wanted to complete.  Patient's brother present.  Went over the form with the patient.  I encouraged the patient to ask her family before she listed them as healthcare agents.  They are coming tonight and will fill that part out.  We completed the living will portion except for the signature page.  Will pass on for completion tomorrow morning. Chaplain Katherene Ponto
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
## 2017-08-19 NOTE — Progress Notes (Signed)
CSW consulted for advance directives. CSW spoke with patient at bedside who confirmed she is interested in completing advance directives. CSW agreed to notify chaplain. CSW paged chaplain to notify of patient's request to complete advance directives. CSW signing off, no other needs identified at this time.  Abundio Miu, [place] Social Worker [facility] Cell#: [phone]
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
## 2017-08-20 ENCOUNTER — Inpatient Hospital Stay (HOSPITAL_COMMUNITY): Payer: Medicare Other

## 2017-08-20 ENCOUNTER — Inpatient Hospital Stay (HOSPITAL_COMMUNITY): Payer: Medicare Other | Admitting: Anesthesiology

## 2017-08-20 ENCOUNTER — Encounter (HOSPITAL_COMMUNITY)
Admission: EM | Disposition: A | Discharge status: Home / Self Care | Payer: Self-pay | Source: Home / Self Care | Attending: Internal Medicine

## 2017-08-20 ENCOUNTER — Encounter (HOSPITAL_COMMUNITY): Payer: Self-pay | Admitting: *Deleted

## 2017-08-20 DIAGNOSIS — R609 Edema, unspecified: Secondary | ICD-10-CM

## 2017-08-20 HISTORY — PX: ESOPHAGOGASTRODUODENOSCOPY (EGD) WITH PROPOFOL: SHX5813

## 2017-08-20 HISTORY — PX: BIOPSY: SHX5522

## 2017-08-20 LAB — GLUCOSE, CAPILLARY
GLUCOSE-CAPILLARY: 114 mg/dL — AB (ref 65–99)
GLUCOSE-CAPILLARY: 83 mg/dL (ref 65–99)
Glucose-Capillary: 119 mg/dL — ABNORMAL HIGH (ref 65–99)
Glucose-Capillary: 232 mg/dL — ABNORMAL HIGH (ref 65–99)
Glucose-Capillary: 98 mg/dL (ref 65–99)

## 2017-08-20 LAB — AFP TUMOR MARKER: AFP, Serum, Tumor Marker: 5.7 ng/mL (ref 0.0–8.3)

## 2017-08-20 LAB — CBC WITH DIFFERENTIAL/PLATELET
BASOS PCT: 1 %
Basophils Absolute: 0 10*3/uL (ref 0.0–0.1)
Eosinophils Absolute: 0.2 10*3/uL (ref 0.0–0.7)
Eosinophils Relative: 5 %
HEMATOCRIT: 38.4 % (ref 36.0–46.0)
HEMOGLOBIN: 12.2 g/dL (ref 12.0–15.0)
LYMPHS PCT: 32 %
Lymphs Abs: 1.2 10*3/uL (ref 0.7–4.0)
MCH: 26.3 pg (ref 26.0–34.0)
MCHC: 31.8 g/dL (ref 30.0–36.0)
MCV: 82.8 fL (ref 78.0–100.0)
MONOS PCT: 10 %
Monocytes Absolute: 0.4 10*3/uL (ref 0.1–1.0)
NEUTROS ABS: 2 10*3/uL (ref 1.7–7.7)
Neutrophils Relative %: 52 %
Platelets: 90 10*3/uL — ABNORMAL LOW (ref 150–400)
RBC: 4.64 MIL/uL (ref 3.87–5.11)
RDW: 15 % (ref 11.5–15.5)
WBC: 3.8 10*3/uL — ABNORMAL LOW (ref 4.0–10.5)

## 2017-08-20 LAB — CERULOPLASMIN: Ceruloplasmin: 25.6 mg/dL (ref 19.0–39.0)

## 2017-08-20 LAB — ANTINUCLEAR ANTIBODIES, IFA: ANA Ab, IFA: NEGATIVE

## 2017-08-20 LAB — ANTI-SMOOTH MUSCLE ANTIBODY, IGG: F-Actin IgG: 12 Units (ref 0–19)

## 2017-08-20 LAB — HEPATITIS B SURFACE ANTIGEN: Hepatitis B Surface Ag: NEGATIVE

## 2017-08-20 LAB — ALPHA-1-ANTITRYPSIN: A1 ANTITRYPSIN SER: 109 mg/dL (ref 90–200)

## 2017-08-20 LAB — HIV ANTIBODY (ROUTINE TESTING W REFLEX): HIV SCREEN 4TH GENERATION: NONREACTIVE

## 2017-08-20 LAB — MITOCHONDRIAL ANTIBODIES

## 2017-08-20 SURGERY — ESOPHAGOGASTRODUODENOSCOPY (EGD) WITH PROPOFOL
Anesthesia: General

## 2017-08-20 MED ORDER — DEXTROSE-NACL 5-0.45 % IV SOLN
INTRAVENOUS | Status: DC
  Administered 2017-08-20: 10:00:00 via INTRAVENOUS

## 2017-08-20 MED ORDER — PROPOFOL 10 MG/ML IV BOLUS
INTRAVENOUS | Status: DC | PRN
  Administered 2017-08-20: 20 mg via INTRAVENOUS

## 2017-08-20 MED ORDER — LACTATED RINGERS IV SOLN
INTRAVENOUS | Status: DC
Start: 2017-08-20 — End: 2017-08-20
  Administered 2017-08-20: 12:00:00 via INTRAVENOUS

## 2017-08-20 MED ORDER — PANTOPRAZOLE SODIUM 40 MG PO TBEC: 40.0000 mg | DELAYED_RELEASE_TABLET | Freq: Every day | ORAL | 0 refills | Status: DC

## 2017-08-20 MED ORDER — LIDOCAINE 2% (20 MG/ML) 5 ML SYRINGE
INTRAMUSCULAR | Status: DC | PRN
  Administered 2017-08-20: 100 mg via INTRAVENOUS

## 2017-08-20 MED ORDER — PROPOFOL 500 MG/50ML IV EMUL
INTRAVENOUS | Status: DC | PRN
  Administered 2017-08-20: 125 ug/kg/min via INTRAVENOUS

## 2017-08-20 MED ORDER — PANTOPRAZOLE SODIUM 40 MG PO TBEC: 40.0000 mg | DELAYED_RELEASE_TABLET | Freq: Every day | ORAL | Status: DC

## 2017-08-20 MED ORDER — PROPOFOL 10 MG/ML IV BOLUS
INTRAVENOUS | Status: AC
  Filled 2017-08-20: qty 60

## 2017-08-20 MED ORDER — SODIUM CHLORIDE 0.9 % IV SOLN: INTRAVENOUS | Status: DC

## 2017-08-20 SURGICAL SUPPLY — 15 items

## 2017-08-20 NOTE — Op Note (Signed)
EGD was performed for hematemesis and history of cirrhosis rule out varices.  Findings: The entire esophagus appeared unremarkable. Z line was regular at 35 cm from incisors. Moderately erythematous antrum noted, biopsies taken for H. Pylori. Normal-appearing cardia and fundus on retroflexion. Normal-appearing duodenal bulb and duodenum.   Recommendation: PPI once daily. DC octreotide. Regular diet. Okay to discharge home from GI standpoint,follow-up at [place] office with Dr. Oletta Lamas as previously scheduled- for cirrhosis(likely related to NASH, labs normal for HBsAg, HCV AB, ceruloplasmin, alpha 1 antitrypsin, Ferritin, AFP, pending labs for AMA and ASMA).   Ronnette Juniper, MD

## 2017-08-20 NOTE — Interval H&P Note (Signed)
History and Physical Interval Note: 61/female with hematemesis, NSAIDs use,cirrhosis for an EGD with possible banding.  08/20/2017 11:48 AM  Katherine [name]  has presented today for EGD with possible banding, with the diagnosis of Hematemesis  The various methods of treatment have been discussed with the patient and family. After consideration of risks, benefits and other options for treatment, the patient has consented to  Procedure(s): ESOPHAGOGASTRODUODENOSCOPY (EGD) WITH PROPOFOL (N/A) as a surgical intervention .  The patient's history has been reviewed, patient examined, no change in status, stable for surgery.  I have reviewed the patient's chart and labs.  Questions were answered to the patient's satisfaction.     Ronnette Juniper

## 2017-08-20 NOTE — Progress Notes (Signed)
Left upper extremity venous duplex completed. There is a very small chronic DVT noted in one of the paired radial veins at the wrist extending about 1/2 to 1 inch in length. All others segments and veins are free of DVT. There is no superficial thrombosis noted. [facility], [place] 08/20/2017 4:07 PM

## 2017-08-20 NOTE — Brief Op Note (Signed)
08/18/2017 - 08/20/2017  12:12 PM  PATIENT:  Katherine [name]  62 y.o. female  PRE-OPERATIVE DIAGNOSIS:  Hematemesis  POST-OPERATIVE DIAGNOSIS:  mild gastritis  PROCEDURE:  Procedure(s): ESOPHAGOGASTRODUODENOSCOPY (EGD) WITH PROPOFOL (N/A)  SURGEON:  Surgeon(s) and Role:    Ronnette Juniper, MD - Primary  PHYSICIAN ASSISTANT:   ASSISTANTS: Burtis Junes, RN, Tinnie Gens, Tech  ANESTHESIA:   MAC  EBL:  Minimal   BLOOD ADMINISTERED:none  DRAINS: none   LOCAL MEDICATIONS USED:  NONE  SPECIMEN:  Biopsy / Limited Resection  DISPOSITION OF SPECIMEN:  PATHOLOGY  COUNTS:  YES  TOURNIQUET:  * No tourniquets in log *  DICTATION: .Dragon Dictation  PLAN OF CARE: Admit to inpatient   PATIENT DISPOSITION:  PACU - hemodynamically stable.   Delay start of Pharmacological VTE agent (>24hrs) due to surgical blood loss or risk of bleeding: no

## 2017-08-20 NOTE — Progress Notes (Signed)
D/C to home instructions reviewed, acknowledged understanding. SRP, RN

## 2017-08-20 NOTE — Anesthesia Procedure Notes (Signed)
Procedure Name: MAC Date/Time: 08/20/2017 11:56 AM Performed by: Dione Booze, CRNA Pre-anesthesia Checklist: Patient identified, Emergency Drugs available, Suction available and Patient being monitored Patient Re-evaluated:Patient Re-evaluated prior to induction Oxygen Delivery Method: Nasal cannula Placement Confirmation: positive ETCO2

## 2017-08-20 NOTE — Transfer of Care (Signed)
Immediate Anesthesia Transfer of Care Note  Patient: Katherine [name]  Procedure(s) Performed: ESOPHAGOGASTRODUODENOSCOPY (EGD) WITH PROPOFOL (N/A )  Patient Location: PACU and Endoscopy Unit  Anesthesia Type:MAC  Level of Consciousness: awake and patient cooperative  Airway & Oxygen Therapy: Patient Spontanous Breathing and Patient connected to nasal cannula oxygen  Post-op Assessment: Report given to RN and Post -op Vital signs reviewed and stable  Post vital signs: Reviewed and stable  Last Vitals:  Vitals Value Taken Time  BP    Temp    Pulse    Resp    SpO2      Last Pain:  Vitals:   08/20/17 1119  TempSrc: Oral  PainSc: 0-No pain      Patients Stated Pain Goal: 3 (17/35/67 0141)  Complications: No apparent anesthesia complications

## 2017-08-20 NOTE — Discharge Summary (Addendum)
Physician Discharge Summary  Katherine [name] NUU:[id number] DOB: 1956/02/22 DOA: 08/18/2017  PCP: Mayra Neer, MD  Admit date: 08/18/2017 Discharge date: 08/20/2017  Admitted From: Home  Disposition:  Home   Recommendations for Outpatient Follow-up and new medication changes:  1. Follow up with Dr, Brigitte Pulse in one week 2. Patient placed on daily pantoprazole.  3. Follow up with Dr. Oletta Lamas from [place] 4. Pending AMA and ASMA.  5. Pending gastric biopsy results.  Home Health: no   Equipment/Devices: no    Discharge Condition: stable  CODE STATUS: full   Diet recommendation: Heart healthy and diabetic prudent.   Brief/Interim Summary: 62 year old female who presented with hematemesis. She does have the significant past medical history for type 2 diabetes mellitus, pulmonary hypertension, obstructive sleep apnea, and hypertension.  Patient reported 2 days of hematemesis, associated with melena.  Positive NSAID use due to musculoskeletal back pain.  On the initial physical examination blood pressure 127/78, heart rate 74, respiratory rate 18, oxygen saturation 97%. Mucous membranes were moist, lungs clear to auscultation bilaterally, no wheezing, rales or rhonchi, heart S1-S2 present rhythmic, the abdomen was soft nontender, no lower extremity edema.  Sodium 141, potassium 3.6, chloride 1 6, bicarb 20, glucose 159, BUN 23, creatinine 1.24, white count 6.9, hemoglobin 12.4, hematocrit 39.9, platelets 148, urine analysis negative for infection.  Chest x-ray negative for infiltrates.  EKG normal sinus rhythm, positive PVCs.  Patient was admitted to the hospital working diagnosis acute blood loss anemia due to upper GI bleed.  1.  Acute blood loss anemia due to acute upper GI bleed.  Patient was admitted to the medical ward, she was placed on a remote telemetry monitor, received IV fluids, intravenous pantoprazole and octreotide. Her hemoglobin and hematocrit remained stable, with no need for  PRBC transfusion.  Patient underwent upper endoscopy, which showed erythematous mucosa in the antrum, biopsies were taken. Patient will follow up with Dr. Oletta Lamas as outpatient.   2.  Liver cirrhosis due to suspected nonalcoholic steatohepatitis.  Patient did not show any signs of encephalopathy, upper endoscopy did not show esophageal varices, further work-up with alpha-1 antitrypsin and ceruloplasmin were negative.  Follow-up AMA ans ASMA.   3.  Left upper extremity non-pitting edema.  Worsening edema over the last few weeks on the left upper extremity, she does have history of a elbow injury about 5 years ago on the left side.  Patient will have a Doppler ultrasonography before discharge.   4.  Type 2 diabetes mellitus.  Paient was placed on insulin sliding scale for glucose coverage and monitoring.  Patient will resume long-acting insulin at discharge.  5.  Hypertension.  Antihypertensive agents were held during her hospitalization, she will resume her chlorothiazide, nebiovolol and lisinopril discharge.  6.  Hypothyroidism.  Continue levothyroxine.  7.  Dyslipidemia and obesity with BMI 44,5.  Continue Zetia and Crestor.  Late entry: Right upper extremity US with distal very small chronic DVT in one of the radial veins, that clinically does not explains patient's edema. Follow as outpatient.   Discharge Diagnoses:  Active Problems:   DM (diabetes mellitus) ([place])   Essential hypertension   Pulmonary hypertension (H[facility])   OSA (obstructive sleep apnea)   Upper GI bleed    Discharge Instructions   Allergies as of 08/20/2017      Reactions   Adhesive [tape] Other (See Comments)   BLISTERS   Morphine And Related       Medication List    TAKE these  medications   BD PEN NEEDLE NANO U/F 32G X 4 MM Misc Generic drug:  Insulin Pen Needle   cholecalciferol 1000 units tablet Commonly known as:  VITAMIN D Take 1,000 Units by mouth daily.   clonazePAM 0.5 MG tablet Commonly known  as:  KLONOPIN Take 1 tablet (0.5 mg total) by mouth 2 (two) times daily.   diclofenac sodium 1 % Gel Commonly known as:  VOLTAREN APPLY 2 G TOPICALLY 4 TIMES DAILY AS NEEDED FOR PAIN   ezetimibe 10 MG tablet Commonly known as:  ZETIA Take 10 mg by mouth daily.   gabapentin 300 MG capsule Commonly known as:  NEURONTIN Take 1 capsule (300 mg total) by mouth 2 (two) times daily.   hydrochlorothiazide 25 MG tablet Commonly known as:  HYDRODIURIL Take 25 mg by mouth daily.   levothyroxine 25 MCG tablet Commonly known as:  SYNTHROID Take 1 tablet (25 mcg total) by mouth daily before breakfast.   liraglutide 18 MG/3ML Sopn Commonly known as:  VICTOZA Inject 1.2 mg into the skin daily.   lisinopril 40 MG tablet Commonly known as:  PRINIVIL,ZESTRIL Take 40 mg by mouth daily.   methocarbamol 500 MG tablet Commonly known as:  ROBAXIN Take 1 tablet (500 mg total) by mouth every 6 (six) hours as needed for muscle spasms.   nebivolol 10 MG tablet Commonly known as:  BYSTOLIC Take 10 mg by mouth daily.   Oxycodone HCl 10 MG Tabs Take 1 tablet (10 mg total) by mouth 2 (two) times daily as needed.   pantoprazole 40 MG tablet Commonly known as:  PROTONIX Take 1 tablet (40 mg total) by mouth daily. Start taking on:  08/21/2017 What changed:  when to take this   rizatriptan 10 MG disintegrating tablet Commonly known as:  MAXALT-MLT Take 10 mg by mouth as needed for migraine.   rosuvastatin 40 MG tablet Commonly known as:  CRESTOR Take 40 mg by mouth daily.   [name] SOLOSTAR 300 UNIT/ML Sopn Generic drug:  Insulin Glargine 70 Units.   VITAMIN B-1 PO Take 1 tablet by mouth daily.   WOMENS MULTI PO Take 1 tablet by mouth daily.       Allergies  Allergen Reactions  . Adhesive [Tape] Other (See Comments)    BLISTERS  . Morphine And Related     Consultations:  GI   Procedures/Studies: Ct Abdomen Pelvis Wo Contrast  Result Date: 07/28/2017 CLINICAL DATA:  Right  flank pain for 3 weeks. EXAM: CT ABDOMEN AND PELVIS WITHOUT CONTRAST TECHNIQUE: Multidetector CT imaging of the abdomen and pelvis was performed following the standard protocol without IV contrast. COMPARISON:  None. FINDINGS: Lower chest: No acute abnormality. Hepatobiliary: No focal liver abnormality is seen. Mild nodular contour of the liver as can be seen with cirrhosis. Status post cholecystectomy. No biliary dilatation. Pancreas: Unremarkable. No pancreatic ductal dilatation or surrounding inflammatory changes. Spleen: Normal in size without focal abnormality. Adrenals/Urinary Tract: Adrenal glands are unremarkable. Kidneys are normal, without renal calculi, focal lesion, or hydronephrosis. Bladder is unremarkable. Stomach/Bowel: Stomach is within normal limits. Appendix appears normal. No evidence of bowel wall thickening, distention, or inflammatory changes. Vascular/Lymphatic: Normal caliber abdominal aorta. Mild abdominal aortic atherosclerosis. Small subcentimeter retroperitoneal para-aortic lymph nodes. Mild mesenteric haziness at the aortic bifurcation which is less pronounced compared with prior CT abdomen dated 10/28/2011. Reproductive: Status post hysterectomy. No adnexal masses. Other: No abdominal wall hernia or abnormality. No abdominopelvic ascites. Musculoskeletal: No acute or significant osseous findings. Degenerative disc disease disc  height loss at L5-S1. Broad-based disc osteophyte complex and facet arthropathy. IMPRESSION: 1. No acute abdominal or pelvic pathology. 2. Mild nodular contour of the liver as can be seen with cirrhosis. Electronically Signed   By: Kathreen Devoid   On: 07/28/2017 16:27   Dg Chest 2 View  Result Date: 08/18/2017 CLINICAL DATA:  Trouble breathing for 2 days with chest tightness. EXAM: CHEST - 2 VIEW COMPARISON:  12/01/2016 FINDINGS: Lungs are somewhat hypoinflated without consolidation or effusion. Cardiomediastinal silhouette and remainder of the exam is  unchanged. IMPRESSION: Hypoinflation without acute cardiopulmonary disease. Electronically Signed   By: Marin Olp M.D.   On: 08/18/2017 18:34   US Abdomen Limited Ruq  Result Date: 08/03/2017 CLINICAL DATA:  Cirrhotic appearing liver on recent CT examination EXAM: ULTRASOUND ABDOMEN LIMITED RIGHT UPPER QUADRANT COMPARISON:  CT abdomen and pelvis Jul 28, 2017 FINDINGS: Gallbladder: Surgically absent. Common bile duct: Diameter: 6 mm. No intrahepatic or extrahepatic biliary duct dilatation. Liver: No focal lesion identified. Liver as a nodular contour with an overall increase in liver echogenicity. Portal vein is patent on color Doppler imaging with normal direction of blood flow towards the liver. IMPRESSION: Liver as a nodular contour with increase in liver echogenicity. These are findings felt to be indicative of hepatic cirrhosis. While no focal liver lesions are evident on this study, it must be cautioned that the sensitivity of ultrasound for detection of focal liver lesions is diminished in this circumstance. Gallbladder absent. Electronically Signed   By: Lowella Grip III M.D.   On: 08/03/2017 09:51       Subjective: Patient is feeling better, no nausea or vomiting, no abdominal pain, no further melena or hematemesis.   Discharge Exam: Vitals:   08/20/17 1213 08/20/17 1223  BP:  108/84  Pulse: 79 69  Resp: 19 (!) 21  Temp: (!) 97.3 F (36.3 C)   SpO2: 99% 94%   Vitals:   08/20/17 0404 08/20/17 1119 08/20/17 1213 08/20/17 1223  BP: 132/83 123/79  108/84  Pulse: 75 70 79 69  Resp: 16 16 19  (!) 21  Temp: 98.1 F (36.7 C) 97.7 F (36.5 C) (!) 97.3 F (36.3 C)   TempSrc: Oral Oral Oral   SpO2: 94% 93% 99% 94%  Weight:  125.2 kg (276 lb)    Height:  5' 6"  (1.676 m)      General: Not in pain or dyspnea Neurology: Awake and alert, non focal  E ENT: mild pallor, no icterus, oral mucosa moist Cardiovascular: No JVD. S1-S2 present, rhythmic, no gallops, rubs, or murmurs.  No lower extremity edema. Left upper extremity with non pitting edema.  Pulmonary: vesicular breath sounds bilaterally, adequate air movement, no wheezing, rhonchi or rales. Gastrointestinal. Abdomen prtouberant no organomegaly, non tender, no rebound or guarding Skin. No rashes Musculoskeletal: no joint deformities   The results of significant diagnostics from this hospitalization (including imaging, microbiology, ancillary and laboratory) are listed below for reference.     Microbiology: No results found for this or any previous visit (from the past 240 hour(s)).   Labs: BNP (last 3 results) No results for input(s): BNP in the last 8760 hours. Basic Metabolic Panel: Recent Labs  Lab 08/18/17 1823  NA 141  K 3.6  CL 106  CO2 28  GLUCOSE 159*  BUN 23*  CREATININE 1.24*  CALCIUM 9.1   Liver Function Tests: Recent Labs  Lab 08/18/17 1823  AST 44*  ALT 25  ALKPHOS 88  BILITOT 0.6  PROT  7.2  ALBUMIN 2.9*   No results for input(s): LIPASE, AMYLASE in the last 168 hours. Recent Labs  Lab 08/18/17 2046  AMMONIA 45*   CBC: Recent Labs  Lab 08/18/17 1823 08/19/17 0149 08/19/17 0706 08/20/17 0439  WBC 6.9  --  5.1 3.8*  NEUTROABS  --   --   --  2.0  HGB 12.4 11.9* 11.1* 12.2  HCT 39.9 38.1 35.5* 38.4  MCV 85.4  --  84.3 82.8  PLT 148*  --  116* 90*   Cardiac Enzymes: No results for input(s): CKTOTAL, CKMB, CKMBINDEX, TROPONINI in the last 168 hours. BNP: Invalid input(s): POCBNP CBG: Recent Labs  Lab 08/20/17 0015 08/20/17 0400 08/20/17 0743 08/20/17 1146 08/20/17 1251  GLUCAP 232* 114* 83 119* 98   D-Dimer No results for input(s): DDIMER in the last 72 hours. Hgb A1c No results for input(s): HGBA1C in the last 72 hours. Lipid Profile Recent Labs    08/19/17 0706  CHOL 75  HDL 27*  LDLCALC 28  TRIG 102  CHOLHDL 2.8   Thyroid function studies No results for input(s): TSH, T4TOTAL, T3FREE, THYROIDAB in the last 72 hours.  Invalid  input(s): FREET3 Anemia work up Recent Labs    08/19/17 1257  TIBC 404  IRON 39   Urinalysis    Component Value Date/Time   COLORURINE YELLOW 08/18/2017 2136   APPEARANCEUR CLEAR 08/18/2017 2136   LABSPEC 1.018 08/18/2017 2136   PHURINE 5.0 08/18/2017 2136   GLUCOSEU NEGATIVE 08/18/2017 2136   HGBUR NEGATIVE 08/18/2017 2136   BILIRUBINUR NEGATIVE 08/18/2017 2136   KETONESUR NEGATIVE 08/18/2017 2136   PROTEINUR NEGATIVE 08/18/2017 2136   UROBILINOGEN 1.0 11/21/2014 1955   NITRITE NEGATIVE 08/18/2017 2136   LEUKOCYTESUR NEGATIVE 08/18/2017 2136   Sepsis Labs Invalid input(s): PROCALCITONIN,  WBC,  LACTICIDVEN Microbiology No results found for this or any previous visit (from the past 240 hour(s)).   Time coordinating discharge: 45 minutes  SIGNED:   Tawni Millers, MD  [facility] 08/20/2017, 1:13 PM Pager [phone]  If 7PM-7AM, please contact night-coverage [email or web address] Password [email or web address]

## 2017-08-20 NOTE — Progress Notes (Signed)
   08/20/17 0900  Clinical Encounter Type  Visited With Patient  Visit Type Initial;Psychological support;Spiritual support  Referral From Patient  Consult/Referral To Chaplain  Spiritual Encounters  Spiritual Needs Other (Comment) (Advance Directive Completion )  Stress Factors  Patient Stress Factors Other (Comment) (Advance Directive Completion )  Advance Directives (For Healthcare)  Does Patient Have a Medical Advance Directive? Yes  Type of Paramedic of [place];Living will (Copy in Chart )  [place]  Does Patient Have a Mental Health Advance Directive? No  Would patient like information on creating a mental health advance directive? No - Patient declined   I visited with the patient per referral from the nurse.  The patient wanted to complete her Advance Directive, Healthcare Power of Attorney and Living Will.  Two witnesses were procured and the document was notarized.  A copy was placed in the patient's chart and the original was given back to the patient.    Chaplain Shanon Ace M.Div., [facility]

## 2017-08-20 NOTE — Op Note (Signed)
[facility] Patient Name: Katherine [name] Procedure Date: 08/20/2017 MRN: [id number] Attending MD: Ronnette Juniper , MD Date of Birth: 09-02-1955 CSN: [id number] Age: 62 Admit Type: Inpatient Procedure:                Upper GI endoscopy Indications:              Hematemesis, Cirrhosis rule out esophageal varices Providers:                Ronnette Juniper, MD, Burtis Junes, RN, Tinnie Gens,                            Technician, Dione Booze, CRNA Referring MD:              Medicines:                Monitored Anesthesia Care Complications:            No immediate complications. Estimated Blood Loss:     Estimated blood loss: none. Procedure:                Pre-Anesthesia Assessment:                           - Prior to the procedure, a History and Physical                            was performed, and patient medications and                            allergies were reviewed. The patient's tolerance of                            previous anesthesia was also reviewed. The risks                            and benefits of the procedure and the sedation                            options and risks were discussed with the patient.                            All questions were answered, and informed consent                            was obtained. Prior Anticoagulants: The patient has                            taken no previous anticoagulant or antiplatelet                            agents. ASA Grade Assessment: III - A patient with                            severe systemic disease. After reviewing the risks  and benefits, the patient was deemed in                            satisfactory condition to undergo the procedure.                           After obtaining informed consent, the endoscope was                            passed under direct vision. Throughout the                            procedure, the patient's blood pressure, pulse, and                  oxygen saturations were monitored continuously. The                            [id number] ) was introduced through the                            mouth, and advanced to the second part of duodenum.                            The upper GI endoscopy was accomplished without                            difficulty. The patient tolerated the procedure                            well. Scope In: Scope Out: Findings:      The examined esophagus was normal.      The Z-line was regular and was found 35 cm from the incisors.      Localized moderately erythematous mucosa without bleeding was found in       the gastric antrum. Biopsies were taken with a cold forceps for       Helicobacter pylori testing.      The cardia and gastric fundus were normal on retroflexion.      The examined duodenum was normal. Impression:               - Normal esophagus.                           - Z-line regular, 35 cm from the incisors.                           - Erythematous mucosa in the antrum. Biopsied.                           - Normal examined duodenum. Moderate Sedation:      Patient did not receive moderate sedation for this procedure, but       instead received monitored anesthesia care. Recommendation:           - Resume regular diet.                           -  Continue present medications.                           - Await pathology results.                           - Return to GI office as previously scheduled. Procedure Code(s):        --- Professional ---                           [phone], Esophagogastroduodenoscopy, flexible,                            transoral; with biopsy, single or multiple Diagnosis Code(s):        --- Professional ---                           K31.89, Other diseases of stomach and duodenum                           K92.0, Hematemesis                           K74.60, Unspecified cirrhosis of liver CPT copyright 2017 American Medical Association. All  rights reserved. The codes documented in this report are preliminary and upon coder review may  be revised to meet current compliance requirements. Ronnette Juniper, MD 08/20/2017 12:11:52 PM This report has been signed electronically. Number of Addenda: 0

## 2017-08-20 NOTE — Anesthesia Postprocedure Evaluation (Signed)
Anesthesia Post Note  Patient: Katherine [name]  Procedure(s) Performed: ESOPHAGOGASTRODUODENOSCOPY (EGD) WITH PROPOFOL (N/A )     Patient location during evaluation: PACU Anesthesia Type: MAC Level of consciousness: awake and alert Pain management: pain level controlled Vital Signs Assessment: post-procedure vital signs reviewed and stable Respiratory status: spontaneous breathing, nonlabored ventilation, respiratory function stable and patient connected to nasal cannula oxygen Cardiovascular status: stable and blood pressure returned to baseline Postop Assessment: no apparent nausea or vomiting Anesthetic complications: no    Last Vitals:  Vitals:   08/20/17 1119 08/20/17 1213  BP: 123/79   Pulse: 70 79  Resp: 16 19  Temp: 36.5 C (!) 36.3 C  SpO2: 93% 99%    Last Pain:  Vitals:   08/20/17 1213  TempSrc: Oral  PainSc: 3                  Vincenzina Jagoda

## 2017-08-20 NOTE — Anesthesia Preprocedure Evaluation (Addendum)
Anesthesia Evaluation  Patient identified by MRN, date of birth, ID band Patient awake    Reviewed: Allergy & Precautions, H&P , NPO status , Patient's Chart, lab work & pertinent test results  Airway Mallampati: I  TM Distance: >3 FB Neck ROM: Full    Dental no notable dental hx. (+) Teeth Intact, Dental Advisory Given   Pulmonary sleep apnea , Current Smoker, former smoker,    Pulmonary exam normal breath sounds clear to auscultation       Cardiovascular hypertension, On Medications, Pt. on medications and Pt. on home beta blockers  Rhythm:Regular Rate:Normal     Neuro/Psych  Headaches, PSYCHIATRIC DISORDERS Anxiety Depression    GI/Hepatic GERD  Medicated and Controlled,  Endo/Other  diabetes, Type 1, Insulin DependentMorbid obesity  Renal/GU      Musculoskeletal   Abdominal   Peds  Hematology negative hematology ROS (+) anemia ,   Anesthesia Other Findings   Reproductive/Obstetrics negative OB ROS                             Anesthesia Physical  Anesthesia Plan  ASA: III  Anesthesia Plan: General   Post-op Pain Management:    Induction: Intravenous  PONV Risk Score and Plan: 3 and Treatment may vary due to age or medical condition and Ondansetron  Airway Management Planned: Oral ETT  Additional Equipment:   Intra-op Plan:   Post-operative Plan: Extubation in OR  Informed Consent: I have reviewed the patients History and Physical, chart, labs and discussed the procedure including the risks, benefits and alternatives for the proposed anesthesia with the patient or authorized representative who has indicated his/her understanding and acceptance.   Dental advisory given  Plan Discussed with: CRNA, Anesthesiologist and Surgeon  Anesthesia Plan Comments: (  )       Anesthesia Quick Evaluation

## 2017-08-21 ENCOUNTER — Encounter (HOSPITAL_COMMUNITY): Payer: Self-pay | Admitting: Gastroenterology

## 2017-09-01 ENCOUNTER — Encounter: Payer: Medicare Other | Attending: Registered Nurse | Admitting: Registered Nurse

## 2017-09-01 ENCOUNTER — Encounter: Payer: Self-pay | Admitting: Registered Nurse

## 2017-09-01 VITALS — BP 112/80 | HR 99 | Ht 66.0 in | Wt 286.0 lb

## 2017-09-01 DIAGNOSIS — Z79899 Other long term (current) drug therapy: Secondary | ICD-10-CM | POA: Insufficient documentation

## 2017-09-01 DIAGNOSIS — S8412XS Injury of peroneal nerve at lower leg level, left leg, sequela: Secondary | ICD-10-CM | POA: Diagnosis not present

## 2017-09-01 DIAGNOSIS — M1711 Unilateral primary osteoarthritis, right knee: Secondary | ICD-10-CM | POA: Diagnosis not present

## 2017-09-01 DIAGNOSIS — M47816 Spondylosis without myelopathy or radiculopathy, lumbar region: Secondary | ICD-10-CM | POA: Diagnosis not present

## 2017-09-01 DIAGNOSIS — G5712 Meralgia paresthetica, left lower limb: Secondary | ICD-10-CM

## 2017-09-01 DIAGNOSIS — Z5181 Encounter for therapeutic drug level monitoring: Secondary | ICD-10-CM

## 2017-09-01 DIAGNOSIS — G894 Chronic pain syndrome: Secondary | ICD-10-CM | POA: Diagnosis not present

## 2017-09-01 DIAGNOSIS — M546 Pain in thoracic spine: Secondary | ICD-10-CM

## 2017-09-01 NOTE — Patient Instructions (Addendum)
Call office next week to inquire if we received paperwork from Dr Maryjean Ka Office   May take an extra Oxycodone when pain is severe. NO MORE THAN 3 a day , for the next 10 days only.   Call office on Next Week Wednesday on the 10th   [phone]
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
## 2017-09-09 ENCOUNTER — Encounter: Payer: Medicare Other | Admitting: Physical Medicine & Rehabilitation

## 2017-09-10 ENCOUNTER — Telehealth: Payer: Self-pay | Admitting: *Deleted

## 2017-09-10 DIAGNOSIS — S8412XS Injury of peroneal nerve at lower leg level, left leg, sequela: Secondary | ICD-10-CM

## 2017-09-10 DIAGNOSIS — G5712 Meralgia paresthetica, left lower limb: Secondary | ICD-10-CM

## 2017-09-10 MED ORDER — OXYCODONE HCL 10 MG PO TABS: 10.0000 mg | ORAL_TABLET | Freq: Three times a day (TID) | ORAL | 0 refills | Status: DC | PRN

## 2017-09-10 NOTE — Telephone Encounter (Signed)
Patient left a voicemail asking for [facility], ANP.  Patient reports having to take more of her medication.  Patient states that Katherine [name] is aware.  Patient reports 5 tablets left of her pain medication.  Please call back

## 2017-09-10 NOTE — Telephone Encounter (Signed)
Return Katherine [name] call, she has been using her Oxycodone three times a day, last prescription was picked up on 08/12/2017. Oxycodone e-scribe. She verbalizes understanding.   Also states she was diagnosed with Cirrhosis.

## 2017-09-29 ENCOUNTER — Ambulatory Visit: Payer: Medicare Other | Admitting: Neurology

## 2017-09-29 ENCOUNTER — Encounter: Payer: Self-pay | Admitting: Neurology

## 2017-09-29 ENCOUNTER — Other Ambulatory Visit: Payer: Self-pay

## 2017-09-29 VITALS — BP 98/60 | HR 99 | Wt 285.5 lb

## 2017-09-29 DIAGNOSIS — G253 Myoclonus: Secondary | ICD-10-CM
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
## 2017-09-30 ENCOUNTER — Emergency Department (HOSPITAL_COMMUNITY)
Admission: EM | Admit: 2017-09-30 | Discharge: 2017-10-01 | Disposition: A | Discharge status: Home / Self Care | Payer: Medicare Other | Attending: Emergency Medicine | Admitting: Emergency Medicine

## 2017-09-30 ENCOUNTER — Other Ambulatory Visit: Payer: Self-pay

## 2017-09-30 ENCOUNTER — Encounter (HOSPITAL_COMMUNITY): Payer: Self-pay | Admitting: *Deleted

## 2017-09-30 ENCOUNTER — Emergency Department (HOSPITAL_COMMUNITY): Payer: Medicare Other

## 2017-09-30 DIAGNOSIS — E785 Hyperlipidemia, unspecified: Secondary | ICD-10-CM | POA: Insufficient documentation

## 2017-09-30 DIAGNOSIS — Z87891 Personal history of nicotine dependence: Secondary | ICD-10-CM | POA: Insufficient documentation

## 2017-09-30 DIAGNOSIS — E119 Type 2 diabetes mellitus without complications: Secondary | ICD-10-CM | POA: Diagnosis not present

## 2017-09-30 DIAGNOSIS — Z794 Long term (current) use of insulin: Secondary | ICD-10-CM | POA: Insufficient documentation

## 2017-09-30 DIAGNOSIS — I10 Essential (primary) hypertension: Secondary | ICD-10-CM | POA: Diagnosis not present

## 2017-09-30 DIAGNOSIS — Z79899 Other long term (current) drug therapy: Secondary | ICD-10-CM | POA: Insufficient documentation

## 2017-09-30 DIAGNOSIS — R1031 Right lower quadrant pain: Secondary | ICD-10-CM | POA: Diagnosis present

## 2017-09-30 DIAGNOSIS — R112 Nausea with vomiting, unspecified: Secondary | ICD-10-CM | POA: Diagnosis not present

## 2017-09-30 HISTORY — DX: Scarlet fever, uncomplicated: A38.9

## 2017-09-30 HISTORY — DX: Encounter for other specified aftercare: Z51.89

## 2017-09-30 LAB — CBC
HCT: 37.9 % (ref 36.0–46.0)
Hemoglobin: 11.9 g/dL — ABNORMAL LOW (ref 12.0–15.0)
MCH: 25.8 pg — AB (ref 26.0–34.0)
MCHC: 31.4 g/dL (ref 30.0–36.0)
MCV: 82.2 fL (ref 78.0–100.0)
PLATELETS: 153 10*3/uL (ref 150–400)
RBC: 4.61 MIL/uL (ref 3.87–5.11)
RDW: 13.7 % (ref 11.5–15.5)
WBC: 6.9 10*3/uL (ref 4.0–10.5)

## 2017-09-30 LAB — URINALYSIS, ROUTINE W REFLEX MICROSCOPIC
BILIRUBIN URINE: NEGATIVE
GLUCOSE, UA: 50 mg/dL — AB
HGB URINE DIPSTICK: NEGATIVE
Ketones, ur: NEGATIVE mg/dL
Leukocytes, UA: NEGATIVE
Nitrite: NEGATIVE
Protein, ur: NEGATIVE mg/dL
Specific Gravity, Urine: 1.019 (ref 1.005–1.030)
pH: 5 (ref 5.0–8.0)

## 2017-09-30 LAB — COMPREHENSIVE METABOLIC PANEL
ALBUMIN: 2.6 g/dL — AB (ref 3.5–5.0)
ALT: 24 U/L (ref 0–44)
AST: 46 U/L — AB (ref 15–41)
Alkaline Phosphatase: 95 U/L (ref 38–126)
Anion gap: 8 (ref 5–15)
BUN: 16 mg/dL (ref 8–23)
CALCIUM: 9.3 mg/dL (ref 8.9–10.3)
CO2: 28 mmol/L (ref 22–32)
CREATININE: 0.9 mg/dL (ref 0.44–1.00)
Chloride: 103 mmol/L (ref 98–111)
GFR calc Af Amer: >60 mL/min (ref >60)
GFR calc non Af Amer: >60 mL/min (ref >60)
GLUCOSE: 129 mg/dL — AB (ref 70–99)
Potassium: 3.7 mmol/L (ref 3.5–5.1)
SODIUM: 139 mmol/L (ref 135–145)
Total Bilirubin: 0.4 mg/dL (ref 0.3–1.2)
Total Protein: 7.1 g/dL (ref 6.5–8.1)

## 2017-09-30 LAB — PROTIME-INR
INR: 1.18
PROTHROMBIN TIME: 14.9 s (ref 11.4–15.2)

## 2017-09-30 LAB — LIPASE, BLOOD: Lipase: 23 U/L (ref 11–51)

## 2017-09-30 LAB — I-STAT CG4 LACTIC ACID, ED: Lactic Acid, Venous: 1.82 mmol/L (ref 0.5–1.9)

## 2017-09-30 LAB — CBG MONITORING, ED: Glucose-Capillary: 89 mg/dL (ref 70–99)

## 2017-09-30 MED ORDER — MORPHINE SULFATE (PF) 4 MG/ML IV SOLN
4.0000 mg | Freq: Once | INTRAVENOUS | Status: AC
  Administered 2017-09-30: 4 mg via INTRAVENOUS
  Filled 2017-09-30: qty 1

## 2017-09-30 MED ORDER — IOPAMIDOL (ISOVUE-370) INJECTION 76%
100.0000 mL | Freq: Once | INTRAVENOUS | Status: AC | PRN
  Administered 2017-09-30: 100 mL via INTRAVENOUS

## 2017-09-30 MED ORDER — IOPAMIDOL (ISOVUE-370) INJECTION 76%
INTRAVENOUS | Status: AC
  Filled 2017-09-30: qty 100

## 2017-09-30 MED ORDER — ONDANSETRON HCL 4 MG/2ML IJ SOLN
4.0000 mg | Freq: Once | INTRAMUSCULAR | Status: AC
  Administered 2017-09-30: 4 mg via INTRAVENOUS
  Filled 2017-09-30: qty 2

## 2017-09-30 NOTE — ED Provider Notes (Signed)
  Face-to-face evaluation   History: She presents for evaluation of weight gain, anorexia, white-colored stool, abdominal swelling, weakness, and general malaise.  She has weaned off all of her narcotic medications about a month ago and since that time has been worsening back pain.  Physical exam: Morbidly obese, alert and cooperative.  Abdomen is soft there is no focal tenderness.  Patient is lucid.  Medical screening examination/treatment/procedure(s) were conducted as a shared visit with non-physician practitioner(s) and myself.  I personally evaluated the patient during the encounter    Daleen Bo, MD 10/01/17 [id number]

## 2017-09-30 NOTE — ED Notes (Signed)
Pt is escorted  with her DTR in [place] and  Son
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
## 2017-09-30 NOTE — ED Triage Notes (Signed)
Pt presents with right lower abd pain x 2 days.  Hx Cirrhosis of the liver.  Pt reports white stools x 1 week but pt's stool today is orange.  Pt reports urine has been dark.  Pt also report n/v. Pt reports being to tolerate gingerale.  Pt a/o x 4 and ambulates with cane.   Hx: appendectomy, gall bladder removal.

## 2017-09-30 NOTE — ED Notes (Signed)
Pt reports that she has RLQ pain 2/10  Burning and stabbing pain. Pt reports take that she took an oxycodone prior to ED visit that helped to bring her pain down. Pt does reports that she does have pain and burning with urination. Pt reports that her Dr. Rockey Situ her to come in. Pt does report increased SOB with minimal exertion. Pt does have some LE edema Bilaterally, non pitting.

## 2017-10-01 ENCOUNTER — Encounter: Payer: Medicare Other | Admitting: Registered Nurse

## 2017-10-01 MED ORDER — DICYCLOMINE HCL 20 MG PO TABS: 20.0000 mg | ORAL_TABLET | Freq: Two times a day (BID) | ORAL | 0 refills | Status: DC

## 2017-10-01 MED ORDER — ONDANSETRON HCL 4 MG PO TABS: 4.0000 mg | ORAL_TABLET | Freq: Four times a day (QID) | ORAL | 0 refills | Status: DC

## 2017-10-01 NOTE — Discharge Instructions (Addendum)
Please see the information and instructions below regarding your visit.  Your diagnoses today include:  1. Right lower quadrant abdominal pain   2. Non-intractable vomiting with nausea, unspecified vomiting type     Your exam and testing today is reassuring that there is not a condition causing your abdominal pain that we immediately need to intervene on at this time.   Abdominal (belly) pain can be caused by many things. Your caregiver performed an examination and possibly ordered blood/urine tests and imaging (CT scan, x-rays, ultrasound). Many cases can be observed and treated at home after initial evaluation in the emergency department. Even though you are being discharged home, abdominal pain can be unpredictable. Therefore, you need a repeated exam if your pain does not resolve, returns, or worsens. Most patients with abdominal pain don't have to be admitted to the hospital or have surgery, but serious problems like appendicitis and gallbladder attacks can start out as nonspecific pain. Many abdominal conditions cannot be diagnosed in one visit, so follow-up evaluations are very important.  Tests performed today include: Blood counts and electrolytes Blood tests to check liver and kidney function Blood tests to check pancreas function Urine test to look for infection and pregnancy (in women) Vital signs. See below for your results today.   See side panel of your discharge paperwork for testing performed today. Vital signs are listed at the bottom of these instructions.   Medications prescribed:    Take any prescribed medications only as prescribed, and any over the counter medications only as directed on the packaging.  Please take Zofran, every 6-8 hours as needed for nausea vomiting.    Home care instructions:  Try eating, but start with foods that have a lot of fluid in them. Good examples are soup, Jell-O, and popsicles. If you do OK with those foods, you can try soft, bland  foods, such as plain yogurt. Foods that are high in carbohydrates ("carbs"), like bread or saltine crackers, can help settle your stomach. Some people also find that ginger helps with nausea. You should avoid foods that have a lot of fat in them. They can make nausea worse. Call your doctor if your symptoms come back when you try to eat.  Please follow any educational materials contained in this packet.   Follow-up instructions: Please follow-up closely with your PCP by the end of this week for further assessment of your nausea, as well as discussion of nutritional therapy.  Please follow up with gastroenterology.  Return instructions:  Please return to the Emergency Department if you experience worsening symptoms.  SEEK IMMEDIATE MEDICAL ATTENTION IF: The pain does not go away or becomes severe  A temperature above 101F develops  Repeated vomiting occurs (multiple episodes)  The pain becomes localized to portions of the abdomen. The right side could possibly be appendicitis. In an adult, the left lower portion of the abdomen could be colitis or diverticulitis.  Blood is being passed in stools or vomit (bright red or black tarry stools)  You develop chest pain, difficulty breathing, dizziness or fainting, or become confused, poorly responsive, or inconsolable (young children) If you have any other emergent concerns regarding your health  Additional Information:   Your vital signs today were: BP (!) 97/50 (BP Location: Right Arm) Comment (BP Location): Lower arm    Pulse 95    Temp 98.3 F (36.8 C) (Oral)    Resp 20    Ht 5' 6"  (1.676 m)    Wt 129.5 kg (  285 lb 8 oz)    SpO2 93%    BMI 46.08 kg/m  If your blood pressure (BP) was elevated on multiple readings during this visit above 130 for the top number or above 80 for the bottom number, please have this repeated by your primary care provider within one month. --------------  Thank you for allowing [name] to participate in your care  today.

## 2017-10-07 ENCOUNTER — Encounter: Payer: Self-pay | Admitting: Registered Nurse

## 2017-10-07 ENCOUNTER — Encounter: Payer: Medicare Other | Attending: Registered Nurse | Admitting: Registered Nurse

## 2017-10-07 VITALS — BP 97/71 | HR 82 | Resp 14 | Ht 66.0 in | Wt 281.0 lb

## 2017-10-07 DIAGNOSIS — G894 Chronic pain syndrome: Secondary | ICD-10-CM | POA: Diagnosis not present

## 2017-10-07 DIAGNOSIS — G5712 Meralgia paresthetica, left lower limb: Secondary | ICD-10-CM | POA: Diagnosis not present

## 2017-10-07 DIAGNOSIS — S8412XS Injury of peroneal nerve at lower leg level, left leg, sequela: Secondary | ICD-10-CM | POA: Diagnosis not present

## 2017-10-07 DIAGNOSIS — M1711 Unilateral primary osteoarthritis, right knee: Secondary | ICD-10-CM

## 2017-10-07 DIAGNOSIS — Z5181 Encounter for therapeutic drug level monitoring: Secondary | ICD-10-CM | POA: Insufficient documentation

## 2017-10-07 DIAGNOSIS — Z79899 Other long term (current) drug therapy: Secondary | ICD-10-CM | POA: Diagnosis present

## 2017-10-07 DIAGNOSIS — M47816 Spondylosis without myelopathy or radiculopathy, lumbar region: Secondary | ICD-10-CM

## 2017-10-07 MED ORDER — OXYCODONE HCL 10 MG PO TABS: 10.0000 mg | ORAL_TABLET | Freq: Three times a day (TID) | ORAL | 0 refills | Status: DC | PRN

## 2017-10-07 MED ORDER — METHOCARBAMOL 500 MG PO TABS: 500.0000 mg | ORAL_TABLET | Freq: Four times a day (QID) | ORAL | 2 refills | Status: DC | PRN
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
## 2017-10-08 ENCOUNTER — Encounter: Payer: Medicare Other | Admitting: Registered Nurse

## 2017-10-09 ENCOUNTER — Other Ambulatory Visit: Payer: Self-pay | Admitting: Physical Medicine & Rehabilitation

## 2017-10-09 DIAGNOSIS — M47816 Spondylosis without myelopathy or radiculopathy, lumbar region: Secondary | ICD-10-CM

## 2017-10-09 DIAGNOSIS — G894 Chronic pain syndrome: Secondary | ICD-10-CM

## 2017-11-10 ENCOUNTER — Other Ambulatory Visit: Payer: Self-pay

## 2017-11-10 DIAGNOSIS — S8412XS Injury of peroneal nerve at lower leg level, left leg, sequela: Secondary | ICD-10-CM

## 2017-11-10 MED ORDER — GABAPENTIN 300 MG PO CAPS: 300.0000 mg | ORAL_CAPSULE | Freq: Two times a day (BID) | ORAL | 3 refills | Status: DC

## 2017-11-13 ENCOUNTER — Encounter: Payer: Medicare Other | Admitting: Registered Nurse

## 2017-11-16 ENCOUNTER — Encounter: Payer: Medicare Other | Attending: Registered Nurse | Admitting: Registered Nurse

## 2017-11-16 ENCOUNTER — Encounter: Payer: Self-pay | Admitting: Registered Nurse

## 2017-11-16 VITALS — BP 96/68 | HR 87 | Ht 66.0 in | Wt 275.0 lb

## 2017-11-16 DIAGNOSIS — S8412XS Injury of peroneal nerve at lower leg level, left leg, sequela: Secondary | ICD-10-CM | POA: Diagnosis not present

## 2017-11-16 DIAGNOSIS — G5712 Meralgia paresthetica, left lower limb: Secondary | ICD-10-CM | POA: Diagnosis not present

## 2017-11-16 DIAGNOSIS — G894 Chronic pain syndrome: Secondary | ICD-10-CM | POA: Diagnosis not present

## 2017-11-16 DIAGNOSIS — Z5181 Encounter for therapeutic drug level monitoring: Secondary | ICD-10-CM

## 2017-11-16 DIAGNOSIS — M47816 Spondylosis without myelopathy or radiculopathy, lumbar region: Secondary | ICD-10-CM | POA: Diagnosis not present

## 2017-11-16 DIAGNOSIS — Z79899 Other long term (current) drug therapy: Secondary | ICD-10-CM | POA: Insufficient documentation

## 2017-11-16 DIAGNOSIS — M1711 Unilateral primary osteoarthritis, right knee: Secondary | ICD-10-CM

## 2017-11-16 MED ORDER — OXYCODONE HCL 10 MG PO TABS: 10.0000 mg | ORAL_TABLET | Freq: Three times a day (TID) | ORAL | 0 refills | Status: DC | PRN
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
## 2017-12-14 ENCOUNTER — Encounter: Payer: Self-pay | Admitting: Registered Nurse

## 2017-12-14 ENCOUNTER — Other Ambulatory Visit: Payer: Self-pay

## 2017-12-14 ENCOUNTER — Encounter: Payer: Medicare Other | Attending: Registered Nurse | Admitting: Registered Nurse

## 2017-12-14 VITALS — BP 93/70 | HR 84 | Ht 66.0 in | Wt 284.6 lb

## 2017-12-14 DIAGNOSIS — G5712 Meralgia paresthetica, left lower limb: Secondary | ICD-10-CM

## 2017-12-14 DIAGNOSIS — M47816 Spondylosis without myelopathy or radiculopathy, lumbar region: Secondary | ICD-10-CM | POA: Diagnosis not present

## 2017-12-14 DIAGNOSIS — Z79891 Long term (current) use of opiate analgesic: Secondary | ICD-10-CM

## 2017-12-14 DIAGNOSIS — Z79899 Other long term (current) drug therapy: Secondary | ICD-10-CM

## 2017-12-14 DIAGNOSIS — G894 Chronic pain syndrome: Secondary | ICD-10-CM | POA: Diagnosis not present

## 2017-12-14 DIAGNOSIS — F329 Major depressive disorder, single episode, unspecified: Secondary | ICD-10-CM

## 2017-12-14 DIAGNOSIS — Z5181 Encounter for therapeutic drug level monitoring: Secondary | ICD-10-CM | POA: Diagnosis not present

## 2017-12-14 DIAGNOSIS — S8412XS Injury of peroneal nerve at lower leg level, left leg, sequela: Secondary | ICD-10-CM

## 2017-12-14 DIAGNOSIS — M1711 Unilateral primary osteoarthritis, right knee: Secondary | ICD-10-CM

## 2017-12-14 DIAGNOSIS — F411 Generalized anxiety disorder: Secondary | ICD-10-CM

## 2017-12-14 MED ORDER — OXYCODONE HCL 10 MG PO TABS: 10.0000 mg | ORAL_TABLET | Freq: Three times a day (TID) | ORAL | 0 refills | Status: DC | PRN
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
## 2017-12-19 LAB — TOXASSURE SELECT,+ANTIDEPR,UR

## 2017-12-23 ENCOUNTER — Telehealth: Payer: Self-pay | Admitting: *Deleted

## 2017-12-23 NOTE — Telephone Encounter (Signed)
Urine drug screen for this encounter is consistent for prescribed medication 

## 2018-01-14 ENCOUNTER — Other Ambulatory Visit: Payer: Self-pay | Admitting: Physical Medicine & Rehabilitation

## 2018-01-14 ENCOUNTER — Encounter: Payer: Self-pay | Admitting: Registered Nurse

## 2018-01-14 ENCOUNTER — Encounter: Payer: Medicare Other | Attending: Registered Nurse | Admitting: Registered Nurse

## 2018-01-14 VITALS — BP 103/65 | HR 73 | Ht 66.0 in | Wt 277.0 lb

## 2018-01-14 DIAGNOSIS — G5712 Meralgia paresthetica, left lower limb: Secondary | ICD-10-CM | POA: Diagnosis not present

## 2018-01-14 DIAGNOSIS — Z5181 Encounter for therapeutic drug level monitoring: Secondary | ICD-10-CM | POA: Diagnosis present

## 2018-01-14 DIAGNOSIS — S8412XS Injury of peroneal nerve at lower leg level, left leg, sequela: Secondary | ICD-10-CM

## 2018-01-14 DIAGNOSIS — M47816 Spondylosis without myelopathy or radiculopathy, lumbar region: Secondary | ICD-10-CM | POA: Diagnosis not present

## 2018-01-14 DIAGNOSIS — Z79899 Other long term (current) drug therapy: Secondary | ICD-10-CM | POA: Diagnosis present

## 2018-01-14 DIAGNOSIS — Z79891 Long term (current) use of opiate analgesic: Secondary | ICD-10-CM

## 2018-01-14 DIAGNOSIS — M170 Bilateral primary osteoarthritis of knee: Secondary | ICD-10-CM

## 2018-01-14 DIAGNOSIS — G894 Chronic pain syndrome: Secondary | ICD-10-CM | POA: Diagnosis present

## 2018-01-14 DIAGNOSIS — M19172 Post-traumatic osteoarthritis, left ankle and foot: Secondary | ICD-10-CM

## 2018-01-14 DIAGNOSIS — M1711 Unilateral primary osteoarthritis, right knee: Secondary | ICD-10-CM

## 2018-01-14 MED ORDER — METHOCARBAMOL 500 MG PO TABS: 500.0000 mg | ORAL_TABLET | Freq: Four times a day (QID) | ORAL | 3 refills | Status: DC | PRN

## 2018-01-14 MED ORDER — DICLOFENAC SODIUM 1 % TD GEL: TRANSDERMAL | 2 refills | Status: DC
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
## 2018-01-18 ENCOUNTER — Telehealth: Payer: Self-pay | Admitting: *Deleted

## 2018-01-18 DIAGNOSIS — S8412XS Injury of peroneal nerve at lower leg level, left leg, sequela: Secondary | ICD-10-CM

## 2018-01-18 DIAGNOSIS — G5712 Meralgia paresthetica, left lower limb: Secondary | ICD-10-CM

## 2018-01-18 MED ORDER — OXYCODONE HCL 10 MG PO TABS: 10.0000 mg | ORAL_TABLET | Freq: Three times a day (TID) | ORAL | 0 refills | Status: DC | PRN

## 2018-01-18 NOTE — Telephone Encounter (Signed)
PMP Aware: reviewed, last Oxycodone  was filled on 12/14/2017. Oxycodone prescribe. Call placed to Ms. Espinoza she is aware of the above.

## 2018-01-18 NOTE — Telephone Encounter (Signed)
[name] called to let Katherine [name] know she did not send her oxycodone to pharmacy.  Please refill.

## 2018-02-15 ENCOUNTER — Encounter: Payer: Self-pay | Admitting: Registered Nurse

## 2018-02-15 ENCOUNTER — Encounter: Payer: Medicare Other | Attending: Registered Nurse | Admitting: Registered Nurse

## 2018-02-15 ENCOUNTER — Other Ambulatory Visit: Payer: Self-pay | Admitting: Physical Medicine & Rehabilitation

## 2018-02-15 VITALS — BP 115/71 | HR 82 | Resp 14 | Ht 66.0 in | Wt 279.0 lb

## 2018-02-15 DIAGNOSIS — G5712 Meralgia paresthetica, left lower limb: Secondary | ICD-10-CM

## 2018-02-15 DIAGNOSIS — M79661 Pain in right lower leg: Secondary | ICD-10-CM

## 2018-02-15 DIAGNOSIS — G894 Chronic pain syndrome: Secondary | ICD-10-CM | POA: Diagnosis not present

## 2018-02-15 DIAGNOSIS — Z5181 Encounter for therapeutic drug level monitoring: Secondary | ICD-10-CM | POA: Diagnosis present

## 2018-02-15 DIAGNOSIS — Z79891 Long term (current) use of opiate analgesic: Secondary | ICD-10-CM

## 2018-02-15 DIAGNOSIS — S066X3S Traumatic subarachnoid hemorrhage with loss of consciousness of 1 hour to 5 hours 59 minutes, sequela: Secondary | ICD-10-CM | POA: Diagnosis not present

## 2018-02-15 DIAGNOSIS — F329 Major depressive disorder, single episode, unspecified: Secondary | ICD-10-CM

## 2018-02-15 DIAGNOSIS — F411 Generalized anxiety disorder: Secondary | ICD-10-CM

## 2018-02-15 DIAGNOSIS — Z79899 Other long term (current) drug therapy: Secondary | ICD-10-CM | POA: Diagnosis present

## 2018-02-15 DIAGNOSIS — M170 Bilateral primary osteoarthritis of knee: Secondary | ICD-10-CM

## 2018-02-15 DIAGNOSIS — S8412XS Injury of peroneal nerve at lower leg level, left leg, sequela: Secondary | ICD-10-CM

## 2018-02-15 DIAGNOSIS — M19172 Post-traumatic osteoarthritis, left ankle and foot: Secondary | ICD-10-CM

## 2018-02-15 DIAGNOSIS — M47816 Spondylosis without myelopathy or radiculopathy, lumbar region: Secondary | ICD-10-CM

## 2018-02-15 DIAGNOSIS — M1711 Unilateral primary osteoarthritis, right knee: Secondary | ICD-10-CM

## 2018-02-15 MED ORDER — OXYCODONE HCL 10 MG PO TABS: 10.0000 mg | ORAL_TABLET | Freq: Three times a day (TID) | ORAL | 0 refills | Status: DC | PRN
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
## 2018-03-17 ENCOUNTER — Encounter: Payer: Medicare Other | Admitting: Registered Nurse

## 2018-03-24 ENCOUNTER — Encounter: Payer: Medicare Other | Admitting: Registered Nurse

## 2018-03-28 ENCOUNTER — Other Ambulatory Visit: Payer: Self-pay | Admitting: Physical Medicine & Rehabilitation

## 2018-03-29 NOTE — Telephone Encounter (Signed)
Med refilled.

## 2018-03-29 NOTE — Telephone Encounter (Signed)
Recieved electronic medication refill request for duloxetine.  Last mention of this medication was in august of 2019 then abruptly stopped being mentioned in notes.  Unsure if ok to refill this medication.  Please advise.

## 2018-03-30 ENCOUNTER — Telehealth: Payer: Self-pay

## 2018-03-30 DIAGNOSIS — G5712 Meralgia paresthetica, left lower limb: Secondary | ICD-10-CM

## 2018-03-30 DIAGNOSIS — S8412XS Injury of peroneal nerve at lower leg level, left leg, sequela: Secondary | ICD-10-CM

## 2018-03-30 MED ORDER — OXYCODONE HCL 10 MG PO TABS: 10.0000 mg | ORAL_TABLET | Freq: Three times a day (TID) | ORAL | 0 refills | Status: DC | PRN

## 2018-03-30 NOTE — Telephone Encounter (Signed)
Return Katherine [name] call, she states she  Has an  appointment with a Liver specialist tomorrow. Also having vision problems, her opthamologist is following. Last Oxycodone was filled on 02/26/2018, PMP was reviewed. Oxycodone was e-scribed. Her appointment for 03/31/2018 will be cancelled as she follows up with the various specialist at this time. She will keep her February appointment with Katherine [name], she verbalizes understanding.

## 2018-03-30 NOTE — Telephone Encounter (Signed)
Patient called requesting a call back from [facility] in regards to medication.

## 2018-03-31 ENCOUNTER — Encounter: Payer: Medicare Other | Admitting: Registered Nurse

## 2018-04-02 ENCOUNTER — Other Ambulatory Visit: Payer: Self-pay | Admitting: Nurse Practitioner

## 2018-04-02 DIAGNOSIS — K7581 Nonalcoholic steatohepatitis (NASH): Secondary | ICD-10-CM

## 2018-04-06 ENCOUNTER — Ambulatory Visit
Admission: RE | Admit: 2018-04-06 | Discharge: 2018-04-06 | Disposition: A | Discharge status: Home / Self Care | Payer: Medicare Other | Source: Ambulatory Visit | Attending: Nurse Practitioner | Admitting: Nurse Practitioner

## 2018-04-06 DIAGNOSIS — K7581 Nonalcoholic steatohepatitis (NASH): Secondary | ICD-10-CM

## 2018-04-19 ENCOUNTER — Encounter: Payer: Medicare Other | Admitting: Physical Medicine & Rehabilitation

## 2018-04-19 ENCOUNTER — Encounter: Payer: Self-pay | Admitting: Physical Medicine & Rehabilitation

## 2018-04-19 VITALS — BP 102/74 | HR 77 | Resp 14 | Ht 66.0 in | Wt 279.0 lb

## 2018-04-19 DIAGNOSIS — M7121 Synovial cyst of popliteal space [Baker], right knee: Secondary | ICD-10-CM

## 2018-04-19 DIAGNOSIS — Z5181 Encounter for therapeutic drug level monitoring: Secondary | ICD-10-CM

## 2018-04-19 DIAGNOSIS — G5712 Meralgia paresthetica, left lower limb: Secondary | ICD-10-CM

## 2018-04-19 DIAGNOSIS — S8412XS Injury of peroneal nerve at lower leg level, left leg, sequela: Secondary | ICD-10-CM

## 2018-04-19 DIAGNOSIS — G894 Chronic pain syndrome: Secondary | ICD-10-CM

## 2018-04-19 DIAGNOSIS — Z79899 Other long term (current) drug therapy: Secondary | ICD-10-CM | POA: Insufficient documentation

## 2018-04-19 DIAGNOSIS — S72142A Displaced intertrochanteric fracture of left femur, initial encounter for closed fracture: Secondary | ICD-10-CM | POA: Diagnosis not present

## 2018-04-19 DIAGNOSIS — M25552 Pain in left hip: Secondary | ICD-10-CM | POA: Diagnosis not present

## 2018-04-19 MED ORDER — OXYCODONE HCL 10 MG PO TABS
10.0000 mg | ORAL_TABLET | Freq: Three times a day (TID) | ORAL | 0 refills | Status: DC | PRN
Start: 2018-04-19 — End: 2018-04-19

## 2018-04-19 MED ORDER — OXYCODONE HCL 10 MG PO TABS: 10.0000 mg | ORAL_TABLET | Freq: Three times a day (TID) | ORAL | 0 refills | Status: DC | PRN

## 2018-04-19 NOTE — Patient Instructions (Signed)
PLEASE FEEL FREE TO CALL OUR OFFICE WITH ANY PROBLEMS OR QUESTIONS ([phone])      
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
## 2018-04-22 ENCOUNTER — Other Ambulatory Visit: Payer: Self-pay

## 2018-04-22 ENCOUNTER — Inpatient Hospital Stay (HOSPITAL_COMMUNITY)
Admission: EM | Admit: 2018-04-22 | Discharge: 2018-04-29 | DRG: 481 | Disposition: A | Discharge status: Skilled Nursing Facility | Payer: Medicare Other | Attending: Family Medicine | Admitting: Family Medicine

## 2018-04-22 ENCOUNTER — Emergency Department (HOSPITAL_COMMUNITY): Payer: Medicare Other

## 2018-04-22 DIAGNOSIS — E1122 Type 2 diabetes mellitus with diabetic chronic kidney disease: Secondary | ICD-10-CM | POA: Diagnosis present

## 2018-04-22 DIAGNOSIS — G8929 Other chronic pain: Secondary | ICD-10-CM | POA: Diagnosis present

## 2018-04-22 DIAGNOSIS — R52 Pain, unspecified: Secondary | ICD-10-CM

## 2018-04-22 DIAGNOSIS — Z419 Encounter for procedure for purposes other than remedying health state, unspecified: Secondary | ICD-10-CM

## 2018-04-22 DIAGNOSIS — K219 Gastro-esophageal reflux disease without esophagitis: Secondary | ICD-10-CM | POA: Diagnosis present

## 2018-04-22 DIAGNOSIS — Z801 Family history of malignant neoplasm of trachea, bronchus and lung: Secondary | ICD-10-CM

## 2018-04-22 DIAGNOSIS — S42351A Displaced comminuted fracture of shaft of humerus, right arm, initial encounter for closed fracture: Secondary | ICD-10-CM | POA: Diagnosis present

## 2018-04-22 DIAGNOSIS — K7460 Unspecified cirrhosis of liver: Secondary | ICD-10-CM | POA: Diagnosis present

## 2018-04-22 DIAGNOSIS — E039 Hypothyroidism, unspecified: Secondary | ICD-10-CM | POA: Diagnosis present

## 2018-04-22 DIAGNOSIS — W1830XA Fall on same level, unspecified, initial encounter: Secondary | ICD-10-CM | POA: Diagnosis present

## 2018-04-22 DIAGNOSIS — E785 Hyperlipidemia, unspecified: Secondary | ICD-10-CM | POA: Diagnosis present

## 2018-04-22 DIAGNOSIS — S72142A Displaced intertrochanteric fracture of left femur, initial encounter for closed fracture: Secondary | ICD-10-CM | POA: Diagnosis not present

## 2018-04-22 DIAGNOSIS — Z87891 Personal history of nicotine dependence: Secondary | ICD-10-CM

## 2018-04-22 DIAGNOSIS — D62 Acute posthemorrhagic anemia: Secondary | ICD-10-CM | POA: Diagnosis not present

## 2018-04-22 DIAGNOSIS — I10 Essential (primary) hypertension: Secondary | ICD-10-CM | POA: Diagnosis present

## 2018-04-22 DIAGNOSIS — S42354A Nondisplaced comminuted fracture of shaft of humerus, right arm, initial encounter for closed fracture: Secondary | ICD-10-CM

## 2018-04-22 DIAGNOSIS — Z841 Family history of disorders of kidney and ureter: Secondary | ICD-10-CM

## 2018-04-22 DIAGNOSIS — S42291A Other displaced fracture of upper end of right humerus, initial encounter for closed fracture: Secondary | ICD-10-CM | POA: Diagnosis present

## 2018-04-22 DIAGNOSIS — F418 Other specified anxiety disorders: Secondary | ICD-10-CM

## 2018-04-22 DIAGNOSIS — Z79899 Other long term (current) drug therapy: Secondary | ICD-10-CM

## 2018-04-22 DIAGNOSIS — Z87442 Personal history of urinary calculi: Secondary | ICD-10-CM | POA: Diagnosis not present

## 2018-04-22 DIAGNOSIS — I129 Hypertensive chronic kidney disease with stage 1 through stage 4 chronic kidney disease, or unspecified chronic kidney disease: Secondary | ICD-10-CM | POA: Diagnosis present

## 2018-04-22 DIAGNOSIS — K7581 Nonalcoholic steatohepatitis (NASH): Secondary | ICD-10-CM | POA: Diagnosis present

## 2018-04-22 DIAGNOSIS — Y92009 Unspecified place in unspecified non-institutional (private) residence as the place of occurrence of the external cause: Secondary | ICD-10-CM | POA: Diagnosis not present

## 2018-04-22 DIAGNOSIS — Z794 Long term (current) use of insulin: Secondary | ICD-10-CM

## 2018-04-22 DIAGNOSIS — D6959 Other secondary thrombocytopenia: Secondary | ICD-10-CM | POA: Diagnosis not present

## 2018-04-22 DIAGNOSIS — Z79890 Hormone replacement therapy (postmenopausal): Secondary | ICD-10-CM

## 2018-04-22 DIAGNOSIS — E1129 Type 2 diabetes mellitus with other diabetic kidney complication: Secondary | ICD-10-CM | POA: Diagnosis present

## 2018-04-22 DIAGNOSIS — Z91048 Other nonmedicinal substance allergy status: Secondary | ICD-10-CM | POA: Diagnosis not present

## 2018-04-22 DIAGNOSIS — Z8371 Family history of colonic polyps: Secondary | ICD-10-CM

## 2018-04-22 DIAGNOSIS — G5712 Meralgia paresthetica, left lower limb: Secondary | ICD-10-CM

## 2018-04-22 DIAGNOSIS — Z885 Allergy status to narcotic agent status: Secondary | ICD-10-CM | POA: Diagnosis not present

## 2018-04-22 DIAGNOSIS — W19XXXA Unspecified fall, initial encounter: Secondary | ICD-10-CM | POA: Diagnosis not present

## 2018-04-22 DIAGNOSIS — Z8614 Personal history of Methicillin resistant Staphylococcus aureus infection: Secondary | ICD-10-CM

## 2018-04-22 DIAGNOSIS — Z79891 Long term (current) use of opiate analgesic: Secondary | ICD-10-CM

## 2018-04-22 DIAGNOSIS — M21372 Foot drop, left foot: Secondary | ICD-10-CM | POA: Diagnosis present

## 2018-04-22 DIAGNOSIS — Z8249 Family history of ischemic heart disease and other diseases of the circulatory system: Secondary | ICD-10-CM

## 2018-04-22 DIAGNOSIS — N183 Chronic kidney disease, stage 3 unspecified: Secondary | ICD-10-CM | POA: Diagnosis present

## 2018-04-22 DIAGNOSIS — J449 Chronic obstructive pulmonary disease, unspecified: Secondary | ICD-10-CM | POA: Diagnosis present

## 2018-04-22 DIAGNOSIS — M25552 Pain in left hip: Secondary | ICD-10-CM | POA: Diagnosis present

## 2018-04-22 DIAGNOSIS — S8412XS Injury of peroneal nerve at lower leg level, left leg, sequela: Secondary | ICD-10-CM

## 2018-04-22 DIAGNOSIS — G4733 Obstructive sleep apnea (adult) (pediatric): Secondary | ICD-10-CM | POA: Diagnosis present

## 2018-04-22 DIAGNOSIS — Z96619 Presence of unspecified artificial shoulder joint: Secondary | ICD-10-CM

## 2018-04-22 DIAGNOSIS — D649 Anemia, unspecified: Secondary | ICD-10-CM | POA: Diagnosis not present

## 2018-04-22 DIAGNOSIS — S72002A Fracture of unspecified part of neck of left femur, initial encounter for closed fracture: Secondary | ICD-10-CM

## 2018-04-22 DIAGNOSIS — Z90710 Acquired absence of both cervix and uterus: Secondary | ICD-10-CM

## 2018-04-22 LAB — CBC WITH DIFFERENTIAL/PLATELET
Abs Immature Granulocytes: 0.05 10*3/uL (ref 0.00–0.07)
BASOS ABS: 0 10*3/uL (ref 0.0–0.1)
BASOS PCT: 0 %
EOS ABS: 0.1 10*3/uL (ref 0.0–0.5)
EOS PCT: 2 %
HEMATOCRIT: 40.9 % (ref 36.0–46.0)
Hemoglobin: 12.5 g/dL (ref 12.0–15.0)
IMMATURE GRANULOCYTES: 1 %
Lymphocytes Relative: 20 %
Lymphs Abs: 1.5 10*3/uL (ref 0.7–4.0)
MCH: 25.9 pg — ABNORMAL LOW (ref 26.0–34.0)
MCHC: 30.6 g/dL (ref 30.0–36.0)
MCV: 84.7 fL (ref 80.0–100.0)
Monocytes Absolute: 0.5 10*3/uL (ref 0.1–1.0)
Monocytes Relative: 7 %
NEUTROS PCT: 70 %
NRBC: 0 % (ref 0.0–0.2)
Neutro Abs: 5 10*3/uL (ref 1.7–7.7)
Platelets: DECREASED 10*3/uL (ref 150–400)
RBC: 4.83 MIL/uL (ref 3.87–5.11)
RDW: 13.4 % (ref 11.5–15.5)
WBC: 7.2 10*3/uL (ref 4.0–10.5)

## 2018-04-22 LAB — BASIC METABOLIC PANEL
Anion gap: 5 (ref 5–15)
BUN: 24 mg/dL — ABNORMAL HIGH (ref 8–23)
CALCIUM: 9.5 mg/dL (ref 8.9–10.3)
CHLORIDE: 101 mmol/L (ref 98–111)
CO2: 30 mmol/L (ref 22–32)
CREATININE: 1.19 mg/dL — AB (ref 0.44–1.00)
GFR calc Af Amer: 57 mL/min — ABNORMAL LOW (ref >60)
GFR, EST NON AFRICAN AMERICAN: 49 mL/min — AB (ref >60)
Glucose, Bld: 208 mg/dL — ABNORMAL HIGH (ref 70–99)
POTASSIUM: 3.7 mmol/L (ref 3.5–5.1)
Sodium: 136 mmol/L (ref 135–145)

## 2018-04-22 MED ORDER — METHOCARBAMOL 500 MG PO TABS
500.0000 mg | ORAL_TABLET | Freq: Three times a day (TID) | ORAL | Status: DC | PRN
  Administered 2018-04-24 – 2018-04-25 (×3): 500 mg via ORAL
  Filled 2018-04-22 (×4): qty 1

## 2018-04-22 MED ORDER — ROSUVASTATIN CALCIUM 20 MG PO TABS
40.0000 mg | ORAL_TABLET | Freq: Every day | ORAL | Status: DC
  Administered 2018-04-24 – 2018-04-28 (×5): 40 mg via ORAL
  Filled 2018-04-22 (×6): qty 2

## 2018-04-22 MED ORDER — FENTANYL CITRATE (PF) 100 MCG/2ML IJ SOLN
100.0000 ug | INTRAMUSCULAR | Status: DC | PRN
  Administered 2018-04-22 – 2018-04-23 (×5): 100 ug via INTRAVENOUS
  Filled 2018-04-22 (×5): qty 2

## 2018-04-22 MED ORDER — LISINOPRIL 20 MG PO TABS: 20.0000 mg | ORAL_TABLET | Freq: Every day | ORAL | Status: DC

## 2018-04-22 MED ORDER — CLONAZEPAM 0.5 MG PO TABS
0.5000 mg | ORAL_TABLET | Freq: Two times a day (BID) | ORAL | Status: DC
  Administered 2018-04-23: 0.5 mg via ORAL
  Filled 2018-04-22: qty 1

## 2018-04-22 MED ORDER — ONDANSETRON HCL 4 MG/2ML IJ SOLN
INTRAMUSCULAR | Status: AC
  Administered 2018-04-22: 20:00:00
  Filled 2018-04-22: qty 2

## 2018-04-22 MED ORDER — LEVOTHYROXINE SODIUM 25 MCG PO TABS
25.0000 ug | ORAL_TABLET | Freq: Every day | ORAL | Status: DC
  Administered 2018-04-23 – 2018-04-29 (×6): 25 ug via ORAL
  Filled 2018-04-22 (×6): qty 1

## 2018-04-22 MED ORDER — ADULT MULTIVITAMIN W/MINERALS CH
1.0000 | ORAL_TABLET | Freq: Every day | ORAL | Status: DC
  Administered 2018-04-25 – 2018-04-29 (×5): 1 via ORAL
  Filled 2018-04-22 (×5): qty 1

## 2018-04-22 MED ORDER — SODIUM CHLORIDE 0.9 % IV BOLUS
1000.0000 mL | Freq: Once | INTRAVENOUS | Status: AC
  Administered 2018-04-22: 1000 mL via INTRAVENOUS

## 2018-04-22 MED ORDER — MORPHINE SULFATE (PF) 2 MG/ML IV SOLN
1.0000 mg | INTRAVENOUS | Status: DC | PRN
  Administered 2018-04-22 – 2018-04-25 (×6): 1 mg via INTRAVENOUS
  Filled 2018-04-22 (×6): qty 1

## 2018-04-22 MED ORDER — INSULIN GLARGINE 100 UNIT/ML ~~LOC~~ SOLN
60.0000 [IU] | Freq: Every day | SUBCUTANEOUS | Status: DC
  Administered 2018-04-23 – 2018-04-28 (×6): 60 [IU] via SUBCUTANEOUS
  Filled 2018-04-22 (×9): qty 0.6

## 2018-04-22 MED ORDER — DICLOFENAC SODIUM 1 % TD GEL: 2.0000 g | Freq: Four times a day (QID) | TRANSDERMAL | Status: DC | PRN

## 2018-04-22 MED ORDER — HYDROMORPHONE HCL 1 MG/ML IJ SOLN
INTRAMUSCULAR | Status: AC
  Administered 2018-04-22: 0.5 mg
  Filled 2018-04-22: qty 1

## 2018-04-22 MED ORDER — GABAPENTIN 300 MG PO CAPS
300.0000 mg | ORAL_CAPSULE | Freq: Two times a day (BID) | ORAL | Status: DC
  Administered 2018-04-23 (×2): 300 mg via ORAL
  Filled 2018-04-22 (×2): qty 1

## 2018-04-22 MED ORDER — NEBIVOLOL HCL 10 MG PO TABS
10.0000 mg | ORAL_TABLET | Freq: Every day | ORAL | Status: DC
  Administered 2018-04-23 – 2018-04-29 (×6): 10 mg via ORAL
  Filled 2018-04-22 (×7): qty 1

## 2018-04-22 MED ORDER — PANTOPRAZOLE SODIUM 40 MG PO TBEC
40.0000 mg | DELAYED_RELEASE_TABLET | Freq: Two times a day (BID) | ORAL | Status: DC
  Administered 2018-04-23 – 2018-04-29 (×12): 40 mg via ORAL
  Filled 2018-04-22 (×12): qty 1

## 2018-04-22 MED ORDER — HYDROMORPHONE HCL 1 MG/ML IJ SOLN: 0.5000 mg | INTRAMUSCULAR | Status: DC | PRN

## 2018-04-22 MED ORDER — OXYCODONE HCL 5 MG PO TABS
5.0000 mg | ORAL_TABLET | ORAL | Status: DC | PRN
  Administered 2018-04-22 – 2018-04-24 (×4): 5 mg via ORAL
  Filled 2018-04-22 (×5): qty 1

## 2018-04-22 MED ORDER — DULOXETINE HCL 60 MG PO CPEP
60.0000 mg | ORAL_CAPSULE | Freq: Every day | ORAL | Status: DC
  Administered 2018-04-23 – 2018-04-28 (×7): 60 mg via ORAL
  Filled 2018-04-22 (×7): qty 1
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
## 2018-04-22 NOTE — ED Notes (Signed)
Attempted to call report x1, no answer.
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
## 2018-04-22 NOTE — ED Triage Notes (Signed)
Patient states that she tripped on a pallet and fell. Denies LOC. Given 172mg of Fentanyl by EMS. C/o left leg pain.

## 2018-04-22 NOTE — Progress Notes (Signed)
Patient with pre-existing left foot drop, history of subarachnoid hemorrhage, with new onset fall, left subtrochanteric femur fracture, right comminuted proximal humerus fracture.  Plan for hospitalist admission, preoperative optimization, there is some question if she is on Plavix or not, and plan for surgery tomorrow if medically optimized.  Surgery will not likely be before noon, plan for left hip intramedullary nail fixation, and right shoulder open reduction internal fixation versus arthroplasty.  Full consult to follow, plan for type and screen, to have blood available if needed, CT scan of the right shoulder, sling, Buck's traction if desired.    Johnny Bridge, MD
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
## 2018-04-23 ENCOUNTER — Inpatient Hospital Stay (HOSPITAL_COMMUNITY): Payer: Medicare Other | Admitting: Anesthesiology

## 2018-04-23 ENCOUNTER — Encounter (HOSPITAL_COMMUNITY): Payer: Self-pay | Admitting: Certified Registered Nurse Anesthetist

## 2018-04-23 ENCOUNTER — Inpatient Hospital Stay (HOSPITAL_COMMUNITY): Payer: Medicare Other

## 2018-04-23 ENCOUNTER — Encounter (HOSPITAL_COMMUNITY)
Admission: EM | Disposition: A | Discharge status: Skilled Nursing Facility | Payer: Self-pay | Source: Home / Self Care | Attending: Internal Medicine

## 2018-04-23 DIAGNOSIS — S72142A Displaced intertrochanteric fracture of left femur, initial encounter for closed fracture: Principal | ICD-10-CM

## 2018-04-23 DIAGNOSIS — K7469 Other cirrhosis of liver: Secondary | ICD-10-CM | POA: Diagnosis present

## 2018-04-23 DIAGNOSIS — K7581 Nonalcoholic steatohepatitis (NASH): Secondary | ICD-10-CM | POA: Diagnosis present

## 2018-04-23 DIAGNOSIS — K7460 Unspecified cirrhosis of liver: Secondary | ICD-10-CM | POA: Diagnosis present

## 2018-04-23 HISTORY — PX: FEMUR IM NAIL: SHX1597

## 2018-04-23 LAB — BASIC METABOLIC PANEL
Anion gap: 10 (ref 5–15)
BUN: 26 mg/dL — AB (ref 8–23)
CO2: 29 mmol/L (ref 22–32)
Calcium: 8.8 mg/dL — ABNORMAL LOW (ref 8.9–10.3)
Chloride: 98 mmol/L (ref 98–111)
Creatinine, Ser: 1.24 mg/dL — ABNORMAL HIGH (ref 0.44–1.00)
GFR calc Af Amer: 54 mL/min — ABNORMAL LOW (ref >60)
GFR calc non Af Amer: 47 mL/min — ABNORMAL LOW (ref >60)
Glucose, Bld: 149 mg/dL — ABNORMAL HIGH (ref 70–99)
Potassium: 4 mmol/L (ref 3.5–5.1)
SODIUM: 137 mmol/L (ref 135–145)

## 2018-04-23 LAB — SURGICAL PCR SCREEN
MRSA, PCR: NEGATIVE
Staphylococcus aureus: NEGATIVE

## 2018-04-23 LAB — HEPATIC FUNCTION PANEL
ALT: 27 U/L (ref 0–44)
AST: 47 U/L — ABNORMAL HIGH (ref 15–41)
Albumin: 2.9 g/dL — ABNORMAL LOW (ref 3.5–5.0)
Alkaline Phosphatase: 66 U/L (ref 38–126)
Bilirubin, Direct: 0.1 mg/dL (ref 0.0–0.2)
Indirect Bilirubin: 0.7 mg/dL (ref 0.3–0.9)
Total Bilirubin: 0.8 mg/dL (ref 0.3–1.2)
Total Protein: 7 g/dL (ref 6.5–8.1)

## 2018-04-23 LAB — CBC
HCT: 35.3 % — ABNORMAL LOW (ref 36.0–46.0)
Hemoglobin: 11.2 g/dL — ABNORMAL LOW (ref 12.0–15.0)
MCH: 26.5 pg (ref 26.0–34.0)
MCHC: 31.7 g/dL (ref 30.0–36.0)
MCV: 83.5 fL (ref 80.0–100.0)
PLATELETS: 144 10*3/uL — AB (ref 150–400)
RBC: 4.23 MIL/uL (ref 3.87–5.11)
RDW: 13.7 % (ref 11.5–15.5)
WBC: 16.3 10*3/uL — ABNORMAL HIGH (ref 4.0–10.5)
nRBC: 0 % (ref 0.0–0.2)

## 2018-04-23 LAB — GLUCOSE, CAPILLARY
Glucose-Capillary: 113 mg/dL — ABNORMAL HIGH (ref 70–99)
Glucose-Capillary: 113 mg/dL — ABNORMAL HIGH (ref 70–99)
Glucose-Capillary: 156 mg/dL — ABNORMAL HIGH (ref 70–99)
Glucose-Capillary: 187 mg/dL — ABNORMAL HIGH (ref 70–99)
Glucose-Capillary: 188 mg/dL — ABNORMAL HIGH (ref 70–99)
Glucose-Capillary: 195 mg/dL — ABNORMAL HIGH (ref 70–99)

## 2018-04-23 LAB — BRAIN NATRIURETIC PEPTIDE: B Natriuretic Peptide: 26.7 pg/mL (ref 0.0–100.0)

## 2018-04-23 LAB — APTT: aPTT: 24 seconds (ref 24–36)

## 2018-04-23 LAB — PROTIME-INR
INR: 1.18
Prothrombin Time: 14.9 seconds (ref 11.4–15.2)

## 2018-04-23 LAB — AMMONIA: Ammonia: 28 umol/L (ref 9–35)

## 2018-04-23 SURGERY — INSERTION, INTRAMEDULLARY ROD, FEMUR
Anesthesia: General | Laterality: Left

## 2018-04-23 MED ORDER — MIDAZOLAM HCL 2 MG/2ML IJ SOLN
INTRAMUSCULAR | Status: AC
  Administered 2018-04-23: 2 mg
  Filled 2018-04-23: qty 2

## 2018-04-23 MED ORDER — PHENYLEPHRINE 40 MCG/ML (10ML) SYRINGE FOR IV PUSH (FOR BLOOD PRESSURE SUPPORT)
PREFILLED_SYRINGE | INTRAVENOUS | Status: AC
  Filled 2018-04-23: qty 10

## 2018-04-23 MED ORDER — TRANEXAMIC ACID-NACL 1000-0.7 MG/100ML-% IV SOLN
INTRAVENOUS | Status: AC
  Filled 2018-04-23: qty 100

## 2018-04-23 MED ORDER — INSULIN ASPART 100 UNIT/ML ~~LOC~~ SOLN
0.0000 [IU] | Freq: Three times a day (TID) | SUBCUTANEOUS | Status: DC
  Administered 2018-04-23 – 2018-04-26 (×5): 2 [IU] via SUBCUTANEOUS
  Administered 2018-04-27: 1 [IU] via SUBCUTANEOUS
  Administered 2018-04-27 – 2018-04-28 (×3): 2 [IU] via SUBCUTANEOUS

## 2018-04-23 MED ORDER — SODIUM CHLORIDE 0.9 % IV SOLN
INTRAVENOUS | Status: DC | PRN
  Administered 2018-04-23: 30 ug/min via INTRAVENOUS

## 2018-04-23 MED ORDER — DEXAMETHASONE SODIUM PHOSPHATE 10 MG/ML IJ SOLN
INTRAMUSCULAR | Status: DC | PRN
  Administered 2018-04-23: 5 mg via INTRAVENOUS

## 2018-04-23 MED ORDER — ROCURONIUM BROMIDE 50 MG/5ML IV SOSY
PREFILLED_SYRINGE | INTRAVENOUS | Status: AC
  Filled 2018-04-23: qty 10

## 2018-04-23 MED ORDER — KETOROLAC TROMETHAMINE 0.5 % OP SOLN
1.0000 [drp] | Freq: Three times a day (TID) | OPHTHALMIC | Status: AC | PRN
  Administered 2018-04-23 – 2018-04-24 (×2): 1 [drp] via OPHTHALMIC
  Filled 2018-04-23: qty 5

## 2018-04-23 MED ORDER — OXYCODONE HCL 5 MG PO TABS: 5.0000 mg | ORAL_TABLET | Freq: Once | ORAL | Status: DC | PRN

## 2018-04-23 MED ORDER — FENTANYL CITRATE (PF) 100 MCG/2ML IJ SOLN: 25.0000 ug | INTRAMUSCULAR | Status: DC | PRN

## 2018-04-23 MED ORDER — LACTATED RINGERS IV SOLN
INTRAVENOUS | Status: DC
  Administered 2018-04-23: 12:00:00 via INTRAVENOUS

## 2018-04-23 MED ORDER — FENTANYL CITRATE (PF) 100 MCG/2ML IJ SOLN
INTRAMUSCULAR | Status: AC
  Administered 2018-04-23: 50 ug
  Filled 2018-04-23: qty 2

## 2018-04-23 MED ORDER — BUPIVACAINE LIPOSOME 1.3 % IJ SUSP
INTRAMUSCULAR | Status: DC | PRN
  Administered 2018-04-23: 10 mL via PERINEURAL

## 2018-04-23 MED ORDER — PHENYLEPHRINE 40 MCG/ML (10ML) SYRINGE FOR IV PUSH (FOR BLOOD PRESSURE SUPPORT)
PREFILLED_SYRINGE | INTRAVENOUS | Status: DC | PRN
  Administered 2018-04-23: 120 ug via INTRAVENOUS
  Administered 2018-04-23: 160 ug via INTRAVENOUS
  Administered 2018-04-23: 120 ug via INTRAVENOUS
  Administered 2018-04-23 (×2): 80 ug via INTRAVENOUS

## 2018-04-23 MED ORDER — ONDANSETRON HCL 4 MG/2ML IJ SOLN
INTRAMUSCULAR | Status: DC | PRN
  Administered 2018-04-23: 4 mg via INTRAVENOUS

## 2018-04-23 MED ORDER — POVIDONE-IODINE 10 % EX SWAB: 2.0000 "application " | Freq: Once | CUTANEOUS | Status: DC

## 2018-04-23 MED ORDER — PROPOFOL 10 MG/ML IV BOLUS
INTRAVENOUS | Status: DC | PRN
  Administered 2018-04-23: 120 mg via INTRAVENOUS

## 2018-04-23 MED ORDER — LABETALOL HCL 5 MG/ML IV SOLN
INTRAVENOUS | Status: AC
  Filled 2018-04-23: qty 4

## 2018-04-23 MED ORDER — ONDANSETRON HCL 4 MG/2ML IJ SOLN
INTRAMUSCULAR | Status: AC
  Filled 2018-04-23: qty 2

## 2018-04-23 MED ORDER — TRANEXAMIC ACID-NACL 1000-0.7 MG/100ML-% IV SOLN
1000.0000 mg | INTRAVENOUS | Status: AC
  Administered 2018-04-23: 1000 mg via INTRAVENOUS

## 2018-04-23 MED ORDER — ROCURONIUM BROMIDE 50 MG/5ML IV SOSY
PREFILLED_SYRINGE | INTRAVENOUS | Status: DC | PRN
  Administered 2018-04-23: 20 mg via INTRAVENOUS
  Administered 2018-04-23 (×2): 50 mg via INTRAVENOUS

## 2018-04-23 MED ORDER — LACTATED RINGERS IV SOLN
INTRAVENOUS | Status: DC | PRN
  Administered 2018-04-23: 13:00:00 via INTRAVENOUS

## 2018-04-23 MED ORDER — CHLORHEXIDINE GLUCONATE 4 % EX LIQD
60.0000 mL | Freq: Once | CUTANEOUS | Status: AC
  Administered 2018-04-23: 4 via TOPICAL
  Filled 2018-04-23: qty 15

## 2018-04-23 MED ORDER — FENTANYL CITRATE (PF) 250 MCG/5ML IJ SOLN
INTRAMUSCULAR | Status: AC
  Filled 2018-04-23: qty 5

## 2018-04-23 MED ORDER — ONDANSETRON HCL 4 MG/2ML IJ SOLN: 4.0000 mg | Freq: Three times a day (TID) | INTRAMUSCULAR | Status: DC | PRN

## 2018-04-23 MED ORDER — SUGAMMADEX SODIUM 200 MG/2ML IV SOLN
INTRAVENOUS | Status: DC | PRN
  Administered 2018-04-23: 300 mg via INTRAVENOUS

## 2018-04-23 MED ORDER — GLUCERNA SHAKE PO LIQD
237.0000 mL | Freq: Two times a day (BID) | ORAL | Status: DC
  Administered 2018-04-25 – 2018-04-29 (×10): 237 mL via ORAL

## 2018-04-23 MED ORDER — BSS IO SOLN
15.0000 mL | Freq: Once | INTRAOCULAR | Status: AC
  Administered 2018-04-23: 15 mL
  Filled 2018-04-23: qty 15

## 2018-04-23 MED ORDER — CLONAZEPAM 0.5 MG PO TABS
0.5000 mg | ORAL_TABLET | Freq: Two times a day (BID) | ORAL | Status: DC | PRN
  Filled 2018-04-23: qty 1

## 2018-04-23 MED ORDER — LIDOCAINE 2% (20 MG/ML) 5 ML SYRINGE
INTRAMUSCULAR | Status: DC | PRN
  Administered 2018-04-23: 100 mg via INTRAVENOUS

## 2018-04-23 MED ORDER — OXYCODONE HCL 5 MG/5ML PO SOLN: 5.0000 mg | Freq: Once | ORAL | Status: DC | PRN

## 2018-04-23 MED ORDER — SUCCINYLCHOLINE CHLORIDE 200 MG/10ML IV SOSY
PREFILLED_SYRINGE | INTRAVENOUS | Status: DC | PRN
  Administered 2018-04-23: 120 mg via INTRAVENOUS

## 2018-04-23 MED ORDER — BUPIVACAINE HCL (PF) 0.5 % IJ SOLN
INTRAMUSCULAR | Status: DC | PRN
  Administered 2018-04-23: 20 mL via PERINEURAL

## 2018-04-23 MED ORDER — 0.9 % SODIUM CHLORIDE (POUR BTL) OPTIME
TOPICAL | Status: DC | PRN
  Administered 2018-04-23: 1000 mL

## 2018-04-23 MED ORDER — VANCOMYCIN HCL 10 G IV SOLR
1500.0000 mg | Freq: Once | INTRAVENOUS | Status: AC
  Administered 2018-04-23: 1500 mg via INTRAVENOUS
  Filled 2018-04-23: qty 1500

## 2018-04-23 MED ORDER — HYDROMORPHONE HCL 1 MG/ML IJ SOLN: 0.2500 mg | INTRAMUSCULAR | Status: DC | PRN

## 2018-04-23 MED ORDER — ALBUMIN HUMAN 5 % IV SOLN
INTRAVENOUS | Status: DC | PRN
  Administered 2018-04-23 (×2): via INTRAVENOUS

## 2018-04-23 MED ORDER — ROCURONIUM BROMIDE 50 MG/5ML IV SOSY
PREFILLED_SYRINGE | INTRAVENOUS | Status: AC
  Filled 2018-04-23: qty 5

## 2018-04-23 MED ORDER — LIDOCAINE 2% (20 MG/ML) 5 ML SYRINGE
INTRAMUSCULAR | Status: AC
  Filled 2018-04-23: qty 10

## 2018-04-23 MED ORDER — PROPOFOL 10 MG/ML IV BOLUS
INTRAVENOUS | Status: AC
  Filled 2018-04-23: qty 20

## 2018-04-23 MED ORDER — DEXTROSE 5 % IV SOLN
3.0000 g | INTRAVENOUS | Status: AC
  Administered 2018-04-23: 3 g via INTRAVENOUS
  Filled 2018-04-23 (×2): qty 3000

## 2018-04-23 MED ORDER — PROMETHAZINE HCL 25 MG/ML IJ SOLN: 6.2500 mg | INTRAMUSCULAR | Status: DC | PRN

## 2018-04-23 MED ORDER — DEXAMETHASONE SODIUM PHOSPHATE 10 MG/ML IJ SOLN
INTRAMUSCULAR | Status: AC
  Filled 2018-04-23: qty 1

## 2018-04-23 MED ORDER — HYDRALAZINE HCL 20 MG/ML IJ SOLN: 5.0000 mg | INTRAMUSCULAR | Status: DC | PRN

## 2018-04-23 MED ORDER — FENTANYL CITRATE (PF) 100 MCG/2ML IJ SOLN
INTRAMUSCULAR | Status: DC | PRN
  Administered 2018-04-23 (×2): 50 ug via INTRAVENOUS

## 2018-04-23 SURGICAL SUPPLY — 44 items
APL SKNCLS STERI-STRIP NONHPOA (GAUZE/BANDAGES/DRESSINGS)
BENZOIN TINCTURE PRP APPL 2/3 (GAUZE/BANDAGES/DRESSINGS) IMPLANT
BIT DRILL AO GAMMA 4.2X180 (BIT) ×2 IMPLANT
CLSR STERI-STRIP ANTIMIC 1/2X4 (GAUZE/BANDAGES/DRESSINGS) ×1 IMPLANT
COVER MAYO STAND STRL (DRAPES) ×3 IMPLANT
COVER PERINEAL POST (MISCELLANEOUS) ×3 IMPLANT
COVER SURGICAL LIGHT HANDLE (MISCELLANEOUS) ×3 IMPLANT
COVER WAND RF STERILE (DRAPES) ×1 IMPLANT
DRAPE INCISE IOBAN 66X45 STRL (DRAPES) ×2 IMPLANT
DRAPE STERI IOBAN 125X83 (DRAPES) ×3 IMPLANT
DRESSING AQUACEL AG SP 3.5X4 (GAUZE/BANDAGES/DRESSINGS) ×2 IMPLANT
DRSG AQUACEL AG SP 3.5X4 (GAUZE/BANDAGES/DRESSINGS) ×6
DRSG MEPILEX BORDER 4X4 (GAUZE/BANDAGES/DRESSINGS) ×3 IMPLANT
DURAPREP 26ML APPLICATOR (WOUND CARE) ×3 IMPLANT
ELECT REM PT RETURN 9FT ADLT (ELECTROSURGICAL) ×3
ELECTRODE REM PT RTRN 9FT ADLT (ELECTROSURGICAL) ×2 IMPLANT
GAUZE XEROFORM 1X8 LF (GAUZE/BANDAGES/DRESSINGS) IMPLANT
GLOVE BIO SURGEON STRL SZ7.5 (GLOVE) ×6 IMPLANT
GLOVE BIOGEL PI IND STRL 8 (GLOVE) ×4 IMPLANT
GLOVE BIOGEL PI INDICATOR 8 (GLOVE) ×2
GOWN STRL REUS W/ TWL LRG LVL3 (GOWN DISPOSABLE) ×4 IMPLANT
GOWN STRL REUS W/TWL LRG LVL3 (GOWN DISPOSABLE) ×6
GUIDEROD T2 3X1000 (ROD) ×2 IMPLANT
K-WIRE  3.2X450M STR (WIRE) ×1
K-WIRE 3.2X450M STR (WIRE) ×2
KIT BASIN OR (CUSTOM PROCEDURE TRAY) ×3 IMPLANT
KIT NAIL LONG 10X140X125 (Nail) ×2 IMPLANT
KIT TURNOVER KIT B (KITS) ×3 IMPLANT
KWIRE 3.2X450M STR (WIRE) ×1 IMPLANT
MANIFOLD NEPTUNE II (INSTRUMENTS) ×3 IMPLANT
NS IRRIG 1000ML POUR BTL (IV SOLUTION) ×1 IMPLANT
PACK GENERAL/GYN (CUSTOM PROCEDURE TRAY) ×3 IMPLANT
PAD ARMBOARD 7.5X6 YLW CONV (MISCELLANEOUS) ×6 IMPLANT
SCREW LAG GAMMA 3 95MM (Screw) ×2 IMPLANT
SCREW LOCKING T2 F/T  5MMX50MM (Screw) ×1 IMPLANT
SCREW LOCKING T2 F/T 5MMX50MM (Screw) ×1 IMPLANT
STRIP CLOSURE SKIN 1/2X4 (GAUZE/BANDAGES/DRESSINGS) ×1 IMPLANT
SUPPORT WRAP ARM LG (MISCELLANEOUS) IMPLANT
SUT MNCRL AB 4-0 PS2 18 (SUTURE) ×3 IMPLANT
SUT VIC AB 2-0 CT1 27 (SUTURE) ×3
SUT VIC AB 2-0 CT1 TAPERPNT 27 (SUTURE) ×2 IMPLANT
TOWEL OR 17X26 10 PK STRL BLUE (TOWEL DISPOSABLE) ×3 IMPLANT
TOWEL OR NON WOVEN STRL DISP B (DISPOSABLE) ×3 IMPLANT
WATER STERILE IRR 1000ML POUR (IV SOLUTION) ×1 IMPLANT

## 2018-04-23 NOTE — Anesthesia Preprocedure Evaluation (Signed)
Anesthesia Evaluation  Patient identified by MRN, date of birth, ID band Patient awake    Reviewed: Allergy & Precautions, H&P , NPO status , Patient's Chart, lab work & pertinent test results  Airway Mallampati: I  TM Distance: >3 FB Neck ROM: Full    Dental no notable dental hx. (+) Teeth Intact, Dental Advisory Given   Pulmonary sleep apnea , Current Smoker, former smoker,    Pulmonary exam normal breath sounds clear to auscultation       Cardiovascular hypertension, On Medications, Pt. on medications and Pt. on home beta blockers  Rhythm:Regular Rate:Normal     Neuro/Psych  Headaches, PSYCHIATRIC DISORDERS Anxiety Depression    GI/Hepatic GERD  Medicated and Controlled,  Endo/Other  diabetes, Type 1, Insulin DependentMorbid obesity  Renal/GU      Musculoskeletal  (+) Arthritis , Osteoarthritis,    Abdominal (+) + obese,   Peds  Hematology negative hematology ROS (+) anemia ,   Anesthesia Other Findings   Reproductive/Obstetrics negative OB ROS                             Anesthesia Physical  Anesthesia Plan  ASA: III  Anesthesia Plan: General   Post-op Pain Management:  Regional for Post-op pain   Induction: Intravenous  PONV Risk Score and Plan: 3 and Treatment may vary due to age or medical condition, Ondansetron, Dexamethasone and Midazolam  Airway Management Planned: Oral ETT  Additional Equipment:   Intra-op Plan:   Post-operative Plan: Extubation in OR  Informed Consent: I have reviewed the patients History and Physical, chart, labs and discussed the procedure including the risks, benefits and alternatives for the proposed anesthesia with the patient or authorized representative who has indicated his/her understanding and acceptance.     Dental advisory given  Plan Discussed with: CRNA, Anesthesiologist and Surgeon  Anesthesia Plan Comments: (  )         Anesthesia Quick Evaluation

## 2018-04-23 NOTE — Progress Notes (Addendum)
     Subjective:  Patient reports pain as moderate to severe.  Patient in bed and in some distress follows commands  Objective:   VITALS:   Vitals:   04/23/18 0257 04/23/18 0401 04/23/18 0522 04/23/18 0750  BP: 111/71 107/74 95/76 107/64  Pulse: (!) 110 95 (!) 107 95  Resp: 16 14 15    Temp: 98.7 F (37.1 C) 98 F (36.7 C) 98.4 F (36.9 C)   TempSrc: Oral Oral Oral   SpO2: 96% 98% 97% 97%  Weight:      Height:        ABD soft Sensation intact distally No motion tested Patient lethargic   Lab Results  Component Value Date   WBC 16.3 (H) 04/23/2018   HGB 11.2 (L) 04/23/2018   HCT 35.3 (L) 04/23/2018   MCV 83.5 04/23/2018   PLT 144 (L) 04/23/2018   BMET    Component Value Date/Time   NA 137 04/23/2018 0552   NA 140 05/20/2017 0821   K 4.0 04/23/2018 0552   CL 98 04/23/2018 0552   CO2 29 04/23/2018 0552   GLUCOSE 149 (H) 04/23/2018 0552   BUN 26 (H) 04/23/2018 0552   BUN 21 05/20/2017 0821   CREATININE 1.24 (H) 04/23/2018 0552   CALCIUM 8.8 (L) 04/23/2018 0552   GFRNONAA 47 (L) 04/23/2018 0552   GFRAA 54 (L) 04/23/2018 0552     Assessment/Plan:     Principal Problem:   Closed comminuted intertrochanteric fracture of proximal femur, left, initial encounter ([facility]) Active Problems:   Type II diabetes mellitus with renal manifestations (HCC)   HLD (hyperlipidemia)   Essential hypertension   Depression with anxiety   Hypothyroidism   GERD (gastroesophageal reflux disease)   CKD (chronic kidney disease), stage III (HCC)   Fall   Closed comminuted fracture of right humerus   NASH (nonalcoholic steatohepatitis)   Liver cirrhosis ([place])   Plan for OR later today if cleared by medicine by either Dr Ronney Lion by Dr Mardelle Matte.  Planning fo ORIF of right humerus vs Reverse total shoulder arthroplasty and IM nail left femur       [name] 04/23/2018, 8:28 AM  Discussed and agree with above.   [name], MD Cell [phone]

## 2018-04-23 NOTE — Progress Notes (Signed)
Pt. arrived at 0022, VSS, A&O x4. Pt. experienced a great amount of pain from sliding to bed from stretcher. Pt. rated pain 10/10 in both right arm and left leg. Administered PRN fentanyl. Family members came to the bedside after initial assessment. Will continue to monitor.

## 2018-04-23 NOTE — Anesthesia Procedure Notes (Signed)
Procedure Name: Intubation Date/Time: 04/23/2018 1:34 PM Performed by: Candis Shine, CRNA Pre-anesthesia Checklist: Patient identified, Emergency Drugs available, Suction available and Patient being monitored Patient Re-evaluated:Patient Re-evaluated prior to induction Oxygen Delivery Method: Circle System Utilized Preoxygenation: Pre-oxygenation with 100% oxygen Induction Type: IV induction Ventilation: Mask ventilation without difficulty Laryngoscope Size: Mac and 3 Grade View: Grade I Tube type: Oral Tube size: 7.0 mm Number of attempts: 1 Airway Equipment and Method: Stylet Placement Confirmation: ETT inserted through vocal cords under direct vision,  positive ETCO2 and breath sounds checked- equal and bilateral Secured at: 22 cm Tube secured with: Tape Dental Injury: Teeth and Oropharynx as per pre-operative assessment

## 2018-04-23 NOTE — Transfer of Care (Signed)
Immediate Anesthesia Transfer of Care Note  Patient: Katherine [name]  Procedure(s) Performed: INTRAMEDULLARY (IM) NAIL FEMORAL (Left )  Patient Location: PACU  Anesthesia Type:GA combined with regional for post-op pain  Level of Consciousness: awake, alert  and oriented  Airway & Oxygen Therapy: Patient Spontanous Breathing and Patient connected to face mask oxygen  Post-op Assessment: Report given to RN and Post -op Vital signs reviewed and stable  Post vital signs: Reviewed and stable  Last Vitals:  Vitals Value Taken Time  BP 99/69 04/23/2018  4:13 PM  Temp    Pulse 94 04/23/2018  4:21 PM  Resp 15 04/23/2018  4:21 PM  SpO2 96 % 04/23/2018  4:21 PM  Vitals shown include unvalidated device data.  Last Pain:  Vitals:   04/23/18 0541  TempSrc:   PainSc: 5          Complications: No apparent anesthesia complications
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
## 2018-04-23 NOTE — Plan of Care (Signed)
  Problem: Education: Goal: Knowledge of General Education information will improve Description Including pain rating scale, medication(s)/side effects and non-pharmacologic comfort measures Outcome: Progressing   Problem: Clinical Measurements: Goal: Ability to maintain clinical measurements within normal limits will improve Outcome: Progressing Goal: Will remain free from infection Outcome: Progressing   Problem: Activity: Goal: Risk for activity intolerance will decrease Outcome: Progressing   Problem: Nutrition: Goal: Adequate nutrition will be maintained Outcome: Progressing   Problem: Coping: Goal: Level of anxiety will decrease Outcome: Progressing   Problem: Elimination: Goal: Will not experience complications related to bowel motility Outcome: Progressing Goal: Will not experience complications related to urinary retention Outcome: Progressing   Problem: Pain Managment: Goal: General experience of comfort will improve Outcome: Progressing   Problem: Safety: Goal: Ability to remain free from injury will improve Outcome: Progressing   Problem: Skin Integrity: Goal: Risk for impaired skin integrity will decrease Outcome: Progressing

## 2018-04-23 NOTE — Progress Notes (Signed)
Initial Nutrition Assessment  DOCUMENTATION CODES:   Obesity unspecified  INTERVENTION:   -Once diet is advanced, add:  -MVI with minerals daily -Glucerna Shake po BID, each supplement provides 220 kcal and 10 grams of protein  NUTRITION DIAGNOSIS:   Increased nutrient needs related to post-op healing as evidenced by estimated needs.  GOAL:   Patient will meet greater than or equal to 90% of their needs  MONITOR:   PO intake, Supplement acceptance, Diet advancement, Weight trends, Skin, I & O's  REASON FOR ASSESSMENT:   Consult Assessment of nutrition requirement/status  ASSESSMENT:   Katherine [name] is a 63 y.o. female with medical history significant of hypertension, hyperlipidemia, diabetes mellitus, GERD, hypothyroidism, depression, anxiety, migraine headache, subdural hematoma, left foot drop, obesity, OSA not on CPAP, pseudoseizure, [place] and liver cirrhosis, GI bleeding, CKD stage III, who presents with fall, pain in left upper leg and right shoulder.  Pt admitted with closed comminuted intertochanteric fracture of lt proximal humerus and closed comminuted fracture of rt humerus.   Reviewed I/O's: +1 L x 24 hours  Pt currently NPO. Plan for IM nail left femur andORIF vs. reverse TSA right shoulder.    Spoke with pt and brother at bedside. Pt reports fair appetite PTA. She usually consumes 2-3 meals per day (Breakfast: Glucerna shake; Lunch: bagel and cream cheese; Dinner: meat, rice, and vegetable). Pt shares that she has been trying to transition to a healthier lifestyle over the past few weeks due to poorly controlled DM. Pt is followed by endocrinologist (Dr. [name]) and was concerned about results from last appointment. She shares that she started consuming more lean meat and vegetables and walking 5 minutes on the treadmill daily (pt reports physical activity if often difficult to her related to SOB).   Pt denies any wt loss; wt is generally stable within 5-7  pounds. Documented wt hx reveals wt stability over the past year.   Lab Results  Component Value Date   HGBA1C 10.1 (H) 09/08/2012   PTA DM medications are 1.8 mg liraglutide daily and 80 mg tuojeo daily. Per pt, medications were recently adjusted at last appointment and has seen improvements over the past several weeks. CBGS were running in the 400's, however, pt now achieves readings between 130-140#. Discussed importance of continued self-management and continued efforts toward a healthier lifestyle to improve glycemic control.   Discussed importance of good meal and supplement intake to promote healing. Pt amenable to Glucerna supplements.   Labs reviewed: CBGS: 113-156 (inpatient orders for glycemic control are 0-9 units insulin aspart TID with meals and 60 units insulin glargine q HS).   NUTRITION - FOCUSED PHYSICAL EXAM:    Most Recent Value  Orbital Region  No depletion  Upper Arm Region  No depletion  Thoracic and Lumbar Region  No depletion  Buccal Region  No depletion  Temple Region  No depletion  Clavicle Bone Region  No depletion  Clavicle and Acromion Bone Region  No depletion  Scapular Bone Region  No depletion  Dorsal Hand  No depletion  Patellar Region  No depletion  Anterior Thigh Region  No depletion  Posterior Calf Region  No depletion  Edema (RD Assessment)  Mild  Hair  Reviewed  Eyes  Reviewed  Mouth  Reviewed  Skin  Reviewed  Nails  Reviewed       Diet Order:   Diet Order            Diet NPO time specified Except for:  Ice Chips, Sips with Meds  Diet effective now              EDUCATION NEEDS:   Education needs have been addressed  Skin:  Skin Assessment: Reviewed RN Assessment  Last BM:  04/21/18  Height:   Ht Readings from Last 1 Encounters:  04/23/18 5' 6"  (1.676 m)    Weight:   Wt Readings from Last 1 Encounters:  04/23/18 130.4 kg    Ideal Body Weight:  59.1 kg  BMI:  Body mass index is 46.4 kg/m.  Estimated Nutritional  Needs:   Kcal:  1500-1700  Protein:  95-110 grams  Fluid:  > 1.5 L    Armend Hochstatter A. Jimmye Norman, RD, LDN, CDE Pager: [phone] After hours Pager: [phone]

## 2018-04-23 NOTE — Progress Notes (Addendum)
PROGRESS NOTE    Katherine [name]  FBP:[id number] DOB: 1955-09-11 DOA: 04/22/2018 PCP: Mayra Neer, MD  Brief Narrative: This is a 63 year old morbidly obese female with history of diabetes, hypertension, and dyslipidemia, subdural hematoma, left foot drop, gait disorder, Nash/cirrhosis sleep apnea, chronic kidney disease stage III presented following a mechanical fall to the ED with left thigh and right shoulder pain, the emergency room patient was noted to have a left intertrochanteric femur fracture and right comminuted proximal humerus fracture  Assessment & Plan:     Closed comminuted intertrochanteric fracture of proximal femur, left, and comminuted fracture of right humerus -Ortho consulting, plan for ORIF R humerus and IM nail of L femur today -high risk of VTE and will need DVT proph, post op -anticipate will need ST rehab -She is at moderate cardiopulmonary risk for cardiac and pulmonary complications due to multitude of medical problems especially morbid obesity and cirrhosis, no further work-up recommended -Continue home beta-blocker  Type 2 diabetes mellitus -Poorly controlled, on Victoza and [name] at baseline -Continue 60 units daily, sliding scale insulin, monitor  NASH/cirrhosis -Stable, at risk of fluid overload -Not on diuretics at baseline -Fortunately albumin is 2.9 and INR is 1.18  Hypertension -Continue Bystolic, hold lisinopril  Morbid obesity/OSA -Does not use CPAP bedtime, lifestyle modification needed -All signs symptoms of metabolic syndrome  Chronic kidney disease stage III -Stable, monitor  Depression/anxiety -Continue Klonopin and Cymbalta  Gait disorder left foot drop -Will need physical therapy and short-term rehab postop   DVT ppx: SCD Code Status: Full code Family Communication:  Yes, patient's son and daughter-in-law    at bed side Disposition Plan:  Anticipate discharge back to previous home environment Consults : Dr.  Levy Sjogren    Procedures:   Antimicrobials:    Subjective: -Feels okay, reports pain in her left thigh and right shoulder -Denies any dyspnea or chest pain  Objective: Vitals:   04/23/18 0401 04/23/18 0522 04/23/18 0750 04/23/18 0927  BP: 107/74 95/76 107/64 107/90  Pulse: 95 (!) 107 95 92  Resp: 14 15    Temp: 98 F (36.7 C) 98.4 F (36.9 C)    TempSrc: Oral Oral    SpO2: 98% 97% 97%   Weight:      Height:        Intake/Output Summary (Last 24 hours) at 04/23/2018 1050 Last data filed at 04/23/2018 0500 Gross per 24 hour  Intake 1000 ml  Output -  Net 1000 ml   Filed Weights   04/22/18 2008 04/23/18 0022  Weight: 125.2 kg 130.4 kg    Examination:  General exam: Morbidly obese chronically ill-appearing female, appears much older than older than stated age, AAO x3, no distress   Respiratory system: Poor effort, decreased breath sounds at both bases Cardiovascular system: S1 & S2 heard, RRR  Gastrointestinal system: Abdomen is nondistended, soft and nontender.Normal bowel sounds heard. Central nervous system: Alert and oriented. No focal neurological deficits. Extremities: Left foot is shortened and externally rotated Skin: No rashes, lesions or ulcers Psychiatry: Judgement and insight appear normal. Mood & affect appropriate.     Data Reviewed:   CBC: Recent Labs  Lab 04/22/18 2013 04/23/18 0552  WBC 7.2 16.3*  NEUTROABS 5.0  --   HGB 12.5 11.2*  HCT 40.9 35.3*  MCV 84.7 83.5  PLT PLATELET CLUMPS NOTED ON SMEAR, COUNT APPEARS DECREASED 824*   Basic Metabolic Panel: Recent Labs  Lab 04/22/18 2013 04/23/18 0552  NA 136 137  K 3.7 4.0  CL 101 98  CO2 30 29  GLUCOSE 208* 149*  BUN 24* 26*  CREATININE 1.19* 1.24*  CALCIUM 9.5 8.8*   GFR: Estimated Creatinine Clearance: 65.1 mL/min (A) (by C-G formula based on SCr of 1.24 mg/dL (H)). Liver Function Tests: Recent Labs  Lab 04/23/18 0552  AST 47*  ALT 27  ALKPHOS 66  BILITOT 0.8  PROT  7.0  ALBUMIN 2.9*   No results for input(s): LIPASE, AMYLASE in the last 168 hours. Recent Labs  Lab 04/23/18 0552  AMMONIA 28   Coagulation Profile: Recent Labs  Lab 04/23/18 0552  INR 1.18   Cardiac Enzymes: No results for input(s): CKTOTAL, CKMB, CKMBINDEX, TROPONINI in the last 168 hours. BNP (last 3 results) No results for input(s): PROBNP in the last 8760 hours. HbA1C: No results for input(s): HGBA1C in the last 72 hours. CBG: Recent Labs  Lab 04/23/18 0054 04/23/18 0747  GLUCAP 156* 113*   Lipid Profile: No results for input(s): CHOL, HDL, LDLCALC, TRIG, CHOLHDL, LDLDIRECT in the last 72 hours. Thyroid Function Tests: No results for input(s): TSH, T4TOTAL, FREET4, T3FREE, THYROIDAB in the last 72 hours. Anemia Panel: No results for input(s): VITAMINB12, FOLATE, FERRITIN, TIBC, IRON, RETICCTPCT in the last 72 hours. Urine analysis:    Component Value Date/Time   COLORURINE YELLOW 09/30/2017 1818   APPEARANCEUR CLEAR 09/30/2017 1818   LABSPEC 1.019 09/30/2017 1818   PHURINE 5.0 09/30/2017 1818   GLUCOSEU 50 (A) 09/30/2017 1818   HGBUR NEGATIVE 09/30/2017 1818   BILIRUBINUR NEGATIVE 09/30/2017 1818   KETONESUR NEGATIVE 09/30/2017 1818   PROTEINUR NEGATIVE 09/30/2017 1818   UROBILINOGEN 1.0 11/21/2014 1955   NITRITE NEGATIVE 09/30/2017 1818   LEUKOCYTESUR NEGATIVE 09/30/2017 1818   Sepsis Labs: @LABRCNTIP (procalcitonin:4,lacticidven:4)  ) Recent Results (from the past 240 hour(s))  Surgical pcr screen     Status: None   Collection Time: 04/23/18  2:21 AM  Result Value Ref Range Status   MRSA, PCR NEGATIVE NEGATIVE Final   Staphylococcus aureus NEGATIVE NEGATIVE Final    Comment: (NOTE) The Xpert SA Assay (FDA approved for NASAL specimens in patients 28 years of age and older), is one component of a comprehensive surveillance program. It is not intended to diagnose infection nor to guide or monitor treatment. Performed at [facility],  [place] [address] [place], [place] [address]          Radiology Studies: Dg Chest 1 View  Result Date: 04/22/2018 CLINICAL DATA:  63 year old female status post fall at home tonight. EXAM: CHEST  1 VIEW COMPARISON:  Chest radiograph 08/18/2017 and earlier. FINDINGS: AP supine view at 2106 hours. Stable mediastinal contours, cardiac size at the upper limits of normal. Visualized tracheal air column is within normal limits. Chronic bilateral pulmonary interstitial markings. Otherwise allowing for portable technique the lungs are clear. No pneumothorax or pleural effusion is evident. Chronic right lateral 4th rib fracture appears stable. There is probably also a chronic left lateral 7th rib fracture. Evidence of proximal right humerus fracture. IMPRESSION: 1.  No acute cardiopulmonary abnormality. 2. Proximal right humerus fracture suspected. 3. Occasional chronic rib fractures. Electronically Signed   By: Genevie Ann M.D.   On: 04/22/2018 21:48   Dg Pelvis 1-2 Views  Result Date: 04/22/2018 CLINICAL DATA:  63 y/o  F; fall with left femur pain. EXAM: PELVIS - 1-2 VIEW; LEFT FEMUR 2 VIEWS COMPARISON:  None. FINDINGS: Pelvis: There is no evidence of pelvic fracture or diastasis. No pelvic bone lesions are seen.  Left femur: Acute 3 part intertrochanteric fracture of the left proximal femur extending into the proximal diaphysis. The component of the fracture extending into the femoral shaft demonstrates mild lateral apex angulation and slight displacement. The component including the lesser trochanter is 1/2 shaft's with displaced medially. No hip joint dislocation. IMPRESSION: Acute comminuted intratrochanteric fracture of the left proximal femur extending into proximal diaphysis with angulation and displacement of fracture components. No hip dislocation. No pelvic fracture identified Electronically Signed   By: Kristine Garbe M.D.   On: 04/22/2018 21:51   Dg Shoulder Right  Result Date:  04/22/2018 CLINICAL DATA:  63 year old female status post fall at home tonight. EXAM: RIGHT SHOULDER - 2+ VIEW COMPARISON:  Prior chest radiographs 08/18/2017. FINDINGS: Comminuted and impacted proximal right humerus fracture. The humeral head appears comminuted, with an impacted transverse fracture through the humeral neck. Medial displacement of 1/2 shaft with. The humeral head appears to remain normally aligned with the glenoid. Right clavicle and scapula appear intact. Chronic right lateral 4th rib fracture. IMPRESSION: Comminuted, impacted and medially displaced proximal right humerus fracture. Electronically Signed   By: Genevie Ann M.D.   On: 04/22/2018 21:49   Ct Shoulder Right Wo Contrast  Result Date: 04/22/2018 CLINICAL DATA:  Right shoulder fracture EXAM: CT OF THE UPPER RIGHT EXTREMITY WITHOUT CONTRAST TECHNIQUE: Multidetector CT imaging of the upper right extremity was performed according to the standard protocol. COMPARISON:  Same day radiographs of the right shoulder FINDINGS: Bones/Joint/Cartilage Acute comminuted surgical neck and greater tuberosity fracture of the humeral head with angulation greater than 45 degrees of the humeral head relative to the anteriorly displaced shaft is identified. Fracture of the greater tuberosity also demonstrates displacement of the fracture fragment and angulation relative to the shaft. The acromioclavicular and glenohumeral joints maintain their articulations. No significant joint effusion. The included ribs appear intact. No scapular fracture. Ligaments Suboptimally assessed by CT. Muscles and Tendons Intramuscular hemorrhage or significant atrophy. Soft tissues No soft tissue hematoma. Mild soft tissue induration overlying the shoulder consistent soft tissue contusion. IMPRESSION: Acute comminuted surgical neck and greater tuberosity fracture of the humeral head with angulation greater than 45 degrees of the humeral head relative to the anteriorly displaced  shaft is identified. Fracture of the greater tuberosity also demonstrates displacement of the fracture fragment and angulation relative to the shaft. Findings would suggest a Neer category 3 part fracture the right proximal humerus. Electronically Signed   By: Ashley Royalty M.D.   On: 04/22/2018 23:25   Dg Femur Min 2 Views Left  Result Date: 04/22/2018 CLINICAL DATA:  63 y/o  F; fall with left femur pain. EXAM: PELVIS - 1-2 VIEW; LEFT FEMUR 2 VIEWS COMPARISON:  None. FINDINGS: Pelvis: There is no evidence of pelvic fracture or diastasis. No pelvic bone lesions are seen. Left femur: Acute 3 part intertrochanteric fracture of the left proximal femur extending into the proximal diaphysis. The component of the fracture extending into the femoral shaft demonstrates mild lateral apex angulation and slight displacement. The component including the lesser trochanter is 1/2 shaft's with displaced medially. No hip joint dislocation. IMPRESSION: Acute comminuted intratrochanteric fracture of the left proximal femur extending into proximal diaphysis with angulation and displacement of fracture components. No hip dislocation. No pelvic fracture identified Electronically Signed   By: Kristine Garbe M.D.   On: 04/22/2018 21:51        Scheduled Meds: . clonazePAM  0.5 mg Oral BID  . DULoxetine  60 mg Oral QHS  . [  START ON 04/24/2018] feeding supplement (GLUCERNA SHAKE)  237 mL Oral BID BM  . gabapentin  300 mg Oral BID  . insulin aspart  0-9 Units Subcutaneous TID WC  . insulin glargine  60 Units Subcutaneous QHS  . levothyroxine  25 mcg Oral Q0600  . lisinopril  20 mg Oral Daily  . multivitamin with minerals  1 tablet Oral Daily  . nebivolol  10 mg Oral Daily  . pantoprazole  40 mg Oral BID  . povidone-iodine  2 application Topical Once  . rosuvastatin  40 mg Oral q1800   Continuous Infusions: .  ceFAZolin (ANCEF) IV       LOS: 1 day    Time spent: 70mn    PDomenic Polite MD [facility]  04/23/2018, 10:50 AM

## 2018-04-23 NOTE — Anesthesia Procedure Notes (Signed)
Anesthesia Regional Block: Interscalene brachial plexus block   Pre-Anesthetic Checklist: ,, timeout performed, Correct Patient, Correct Site, Correct Laterality, Correct Procedure, Correct Position, site marked, Risks and benefits discussed,  Surgical consent,  Pre-op evaluation,  At surgeon's request and post-op pain management  Laterality: Right  Prep: chloraprep       Needles:  Injection technique: Single-shot  Needle Type: Stimiplex     Needle Length: 9cm  Needle Gauge: 21     Additional Needles:   Procedures:,,,, ultrasound used (permanent image in chart),,,,  Narrative:  Start time: 04/23/2018 1:15 PM End time: 04/23/2018 1:20 PM Injection made incrementally with aspirations every 5 mL.  Performed by: Personally  Anesthesiologist: Lynda Rainwater, MD

## 2018-04-23 NOTE — Care Management Note (Signed)
Case Management Note  Patient Details  Name: KAELA BEITZ MRN: [id number] Date of Birth: October 05, 1955  Subjective/Objective:                    Action/Plan:  Patient from home. Plan surgery today will folow for post op PT/OT evaluations  Expected Discharge Date:                  Expected Discharge Plan:  [place]  In-House Referral:     Discharge planning Services  CM Consult  Post Acute Care Choice:  Home Health, Durable Medical Equipment Choice offered to:     DME Arranged:    DME Agency:     HH Arranged:    [place] Agency:     Status of Service:  In process, will continue to follow  If discussed at Long Length of Stay Meetings, dates discussed:    Additional Comments:  Marilu Favre, RN 04/23/2018, 8:40 AM

## 2018-04-23 NOTE — Social Work (Signed)
CSW acknowledging consult for SNF placement. Will follow for therapy recommendations.   Jennene Downie, MSW, LCSWA [facility] [phone]   

## 2018-04-23 NOTE — Progress Notes (Signed)
Paged [facility] about pt. going into yellow ME[facility] but received no response.

## 2018-04-24 ENCOUNTER — Inpatient Hospital Stay (HOSPITAL_COMMUNITY): Payer: Medicare Other | Admitting: Certified Registered"

## 2018-04-24 ENCOUNTER — Inpatient Hospital Stay (HOSPITAL_COMMUNITY): Payer: Medicare Other

## 2018-04-24 ENCOUNTER — Encounter (HOSPITAL_COMMUNITY): Payer: Self-pay | Admitting: Certified Registered"

## 2018-04-24 ENCOUNTER — Encounter (HOSPITAL_COMMUNITY)
Admission: EM | Disposition: A | Discharge status: Skilled Nursing Facility | Payer: Self-pay | Source: Home / Self Care | Attending: Internal Medicine

## 2018-04-24 HISTORY — PX: ORIF HUMERUS FRACTURE: SHX2126

## 2018-04-24 LAB — CBC
HCT: 25.2 % — ABNORMAL LOW (ref 36.0–46.0)
HCT: 22.6 % — ABNORMAL LOW (ref 36.0–46.0)
HEMOGLOBIN: 8.1 g/dL — AB (ref 12.0–15.0)
Hemoglobin: 7.4 g/dL — ABNORMAL LOW (ref 12.0–15.0)
MCH: 26.7 pg (ref 26.0–34.0)
MCH: 27.8 pg (ref 26.0–34.0)
MCHC: 32.1 g/dL (ref 30.0–36.0)
MCHC: 32.7 g/dL (ref 30.0–36.0)
MCV: 83.2 fL (ref 80.0–100.0)
MCV: 85 fL (ref 80.0–100.0)
Platelets: 98 10*3/uL — ABNORMAL LOW (ref 150–400)
Platelets: DECREASED 10*3/uL (ref 150–400)
RBC: 3.03 MIL/uL — ABNORMAL LOW (ref 3.87–5.11)
RBC: 2.66 MIL/uL — AB (ref 3.87–5.11)
RDW: 14 % (ref 11.5–15.5)
RDW: 14.4 % (ref 11.5–15.5)
WBC: 13 10*3/uL — ABNORMAL HIGH (ref 4.0–10.5)
WBC: 12.4 10*3/uL — ABNORMAL HIGH (ref 4.0–10.5)
nRBC: 0 % (ref 0.0–0.2)
nRBC: 0 % (ref 0.0–0.2)

## 2018-04-24 LAB — COMPREHENSIVE METABOLIC PANEL
ALBUMIN: 2.9 g/dL — AB (ref 3.5–5.0)
ALT: 26 U/L (ref 0–44)
AST: 57 U/L — ABNORMAL HIGH (ref 15–41)
Alkaline Phosphatase: 51 U/L (ref 38–126)
Anion gap: 10 (ref 5–15)
BUN: 33 mg/dL — ABNORMAL HIGH (ref 8–23)
CO2: 24 mmol/L (ref 22–32)
Calcium: 8.2 mg/dL — ABNORMAL LOW (ref 8.9–10.3)
Chloride: 101 mmol/L (ref 98–111)
Creatinine, Ser: 1.34 mg/dL — ABNORMAL HIGH (ref 0.44–1.00)
GFR calc Af Amer: 49 mL/min — ABNORMAL LOW (ref >60)
GFR calc non Af Amer: 42 mL/min — ABNORMAL LOW (ref >60)
Glucose, Bld: 231 mg/dL — ABNORMAL HIGH (ref 70–99)
Potassium: 4.3 mmol/L (ref 3.5–5.1)
SODIUM: 135 mmol/L (ref 135–145)
Total Bilirubin: 0.8 mg/dL (ref 0.3–1.2)
Total Protein: 6.3 g/dL — ABNORMAL LOW (ref 6.5–8.1)

## 2018-04-24 LAB — GLUCOSE, CAPILLARY
Glucose-Capillary: 178 mg/dL — ABNORMAL HIGH (ref 70–99)
Glucose-Capillary: 140 mg/dL — ABNORMAL HIGH (ref 70–99)
Glucose-Capillary: 155 mg/dL — ABNORMAL HIGH (ref 70–99)
Glucose-Capillary: 180 mg/dL — ABNORMAL HIGH (ref 70–99)

## 2018-04-24 LAB — PREPARE RBC (CROSSMATCH)

## 2018-04-24 SURGERY — OPEN REDUCTION INTERNAL FIXATION (ORIF) PROXIMAL HUMERUS FRACTURE
Anesthesia: Regional | Site: Arm Upper | Laterality: Right

## 2018-04-24 MED ORDER — BUPIVACAINE HCL (PF) 0.25 % IJ SOLN
INTRAMUSCULAR | Status: AC
  Filled 2018-04-24: qty 30

## 2018-04-24 MED ORDER — SUGAMMADEX SODIUM 200 MG/2ML IV SOLN
INTRAVENOUS | Status: DC | PRN
  Administered 2018-04-24: 300 mg via INTRAVENOUS

## 2018-04-24 MED ORDER — PROPOFOL 10 MG/ML IV BOLUS
INTRAVENOUS | Status: DC | PRN
  Administered 2018-04-24: 80 mg via INTRAVENOUS
  Administered 2018-04-24: 20 mg via INTRAVENOUS
  Administered 2018-04-24: 30 mg via INTRAVENOUS
  Administered 2018-04-24: 20 mg via INTRAVENOUS

## 2018-04-24 MED ORDER — FENTANYL CITRATE (PF) 100 MCG/2ML IJ SOLN
INTRAMUSCULAR | Status: DC | PRN
  Administered 2018-04-24 (×3): 50 ug via INTRAVENOUS
  Administered 2018-04-24: 100 ug via INTRAVENOUS

## 2018-04-24 MED ORDER — GABAPENTIN 300 MG PO CAPS
300.0000 mg | ORAL_CAPSULE | Freq: Every day | ORAL | Status: DC
  Administered 2018-04-24 – 2018-04-28 (×5): 300 mg via ORAL
  Filled 2018-04-24 (×5): qty 1

## 2018-04-24 MED ORDER — EPHEDRINE SULFATE-NACL 50-0.9 MG/10ML-% IV SOSY
PREFILLED_SYRINGE | INTRAVENOUS | Status: DC | PRN
  Administered 2018-04-24 (×2): 5 mg via INTRAVENOUS

## 2018-04-24 MED ORDER — SODIUM CHLORIDE 0.9% IV SOLUTION
Freq: Once | INTRAVENOUS | Status: AC
  Administered 2018-04-24: via INTRAVENOUS

## 2018-04-24 MED ORDER — BUPIVACAINE HCL (PF) 0.25 % IJ SOLN
INTRAMUSCULAR | Status: DC | PRN
  Administered 2018-04-24: 20 mL

## 2018-04-24 MED ORDER — ALBUMIN HUMAN 5 % IV SOLN
INTRAVENOUS | Status: DC | PRN
  Administered 2018-04-24: 10:00:00 via INTRAVENOUS

## 2018-04-24 MED ORDER — SODIUM CHLORIDE 0.9% IV SOLUTION: Freq: Once | INTRAVENOUS | Status: AC

## 2018-04-24 MED ORDER — FENTANYL CITRATE (PF) 100 MCG/2ML IJ SOLN: 25.0000 ug | INTRAMUSCULAR | Status: DC | PRN

## 2018-04-24 MED ORDER — ACETAMINOPHEN 325 MG PO TABS
650.0000 mg | ORAL_TABLET | Freq: Once | ORAL | Status: AC
  Administered 2018-04-24: 650 mg via ORAL
  Filled 2018-04-24: qty 2

## 2018-04-24 MED ORDER — SODIUM CHLORIDE 0.9 % IV SOLN
INTRAVENOUS | Status: DC | PRN
  Administered 2018-04-24: 50 ug/min via INTRAVENOUS

## 2018-04-24 MED ORDER — PHENYLEPHRINE 40 MCG/ML (10ML) SYRINGE FOR IV PUSH (FOR BLOOD PRESSURE SUPPORT)
PREFILLED_SYRINGE | INTRAVENOUS | Status: DC | PRN
  Administered 2018-04-24: 80 ug via INTRAVENOUS
  Administered 2018-04-24: 120 ug via INTRAVENOUS

## 2018-04-24 MED ORDER — ROCURONIUM BROMIDE 50 MG/5ML IV SOSY
PREFILLED_SYRINGE | INTRAVENOUS | Status: DC | PRN
  Administered 2018-04-24 (×2): 50 mg via INTRAVENOUS

## 2018-04-24 MED ORDER — VANCOMYCIN HCL 10 G IV SOLR
1500.0000 mg | Freq: Once | INTRAVENOUS | Status: AC
  Administered 2018-04-24: 1500 mg via INTRAVENOUS
  Filled 2018-04-24: qty 1500

## 2018-04-24 MED ORDER — MIDAZOLAM HCL 2 MG/2ML IJ SOLN
INTRAMUSCULAR | Status: AC
  Filled 2018-04-24: qty 2

## 2018-04-24 MED ORDER — ACETAMINOPHEN 500 MG PO TABS: 1000.0000 mg | ORAL_TABLET | Freq: Once | ORAL | Status: DC | PRN

## 2018-04-24 MED ORDER — ACETAMINOPHEN 160 MG/5ML PO SOLN: 1000.0000 mg | Freq: Once | ORAL | Status: DC | PRN

## 2018-04-24 MED ORDER — CHLORHEXIDINE GLUCONATE 4 % EX LIQD: 60.0000 mL | Freq: Once | CUTANEOUS | Status: DC

## 2018-04-24 MED ORDER — SODIUM CHLORIDE 0.9 % IV SOLN
10.0000 mL/h | Freq: Once | INTRAVENOUS | Status: AC
  Administered 2018-04-24: 11:00:00 via INTRAVENOUS

## 2018-04-24 MED ORDER — LACTATED RINGERS IV SOLN
INTRAVENOUS | Status: DC
  Administered 2018-04-24: 09:00:00 via INTRAVENOUS

## 2018-04-24 MED ORDER — BUPIVACAINE-EPINEPHRINE 0.25% -1:200000 IJ SOLN: INTRAMUSCULAR | Status: DC | PRN

## 2018-04-24 MED ORDER — OXYCODONE HCL 5 MG/5ML PO SOLN: 5.0000 mg | Freq: Once | ORAL | Status: DC | PRN

## 2018-04-24 MED ORDER — DEXTROSE 5 % IV SOLN
3.0000 g | Freq: Once | INTRAVENOUS | Status: AC
  Administered 2018-04-24: 3 g via INTRAVENOUS
  Filled 2018-04-24: qty 3000

## 2018-04-24 MED ORDER — PROPOFOL 10 MG/ML IV BOLUS
INTRAVENOUS | Status: AC
  Filled 2018-04-24: qty 20

## 2018-04-24 MED ORDER — 0.9 % SODIUM CHLORIDE (POUR BTL) OPTIME
TOPICAL | Status: DC | PRN
  Administered 2018-04-24: 1000 mL

## 2018-04-24 MED ORDER — FENTANYL CITRATE (PF) 250 MCG/5ML IJ SOLN
INTRAMUSCULAR | Status: AC
  Filled 2018-04-24: qty 5

## 2018-04-24 MED ORDER — ACETAMINOPHEN 10 MG/ML IV SOLN: 1000.0000 mg | Freq: Once | INTRAVENOUS | Status: DC | PRN

## 2018-04-24 MED ORDER — OXYCODONE HCL 5 MG PO TABS: 5.0000 mg | ORAL_TABLET | Freq: Once | ORAL | Status: DC | PRN

## 2018-04-24 SURGICAL SUPPLY — 67 items
APL SKNCLS STERI-STRIP NONHPOA (GAUZE/BANDAGES/DRESSINGS) ×1
BENZOIN TINCTURE PRP APPL 2/3 (GAUZE/BANDAGES/DRESSINGS) ×2 IMPLANT
BIT DRILL 3.2 (BIT) ×2
BIT DRILL 3.2XCALB NS DISP (BIT) IMPLANT
BIT DRILL CALIBRATED 2.7 (BIT) ×1 IMPLANT
BIT DRL 3.2XCALB NS DISP (BIT) ×1
CLEANER TIP ELECTROSURG 2X2 (MISCELLANEOUS) IMPLANT
CLSR STERI-STRIP ANTIMIC 1/2X4 (GAUZE/BANDAGES/DRESSINGS) ×2 IMPLANT
COVER SURGICAL LIGHT HANDLE (MISCELLANEOUS) ×2 IMPLANT
COVER WAND RF STERILE (DRAPES) ×2 IMPLANT
DRAPE C-ARM 42X72 X-RAY (DRAPES) ×2 IMPLANT
DRAPE IMP U-DRAPE 54X76 (DRAPES) ×2 IMPLANT
DRAPE INCISE IOBAN 66X45 STRL (DRAPES) ×2 IMPLANT
DRAPE U-SHAPE 47X51 STRL (DRAPES) ×2 IMPLANT
DRSG AQUACEL AG ADV 3.5X10 (GAUZE/BANDAGES/DRESSINGS) ×2 IMPLANT
DRSG MEPILEX BORDER 4X8 (GAUZE/BANDAGES/DRESSINGS) ×1 IMPLANT
DURAPREP 26ML APPLICATOR (WOUND CARE) ×2 IMPLANT
ELECT REM PT RETURN 9FT ADLT (ELECTROSURGICAL) ×2
ELECTRODE REM PT RTRN 9FT ADLT (ELECTROSURGICAL) IMPLANT
GLOVE BIOGEL PI ORTHO PRO SZ8 (GLOVE) ×1
GLOVE ORTHO TXT STRL SZ7.5 (GLOVE) ×4 IMPLANT
GLOVE PI ORTHO PRO STRL SZ8 (GLOVE) ×1 IMPLANT
GLOVE SURG ORTHO 8.0 STRL STRW (GLOVE) ×4 IMPLANT
GOWN STRL REUS W/ TWL LRG LVL3 (GOWN DISPOSABLE) IMPLANT
GOWN STRL REUS W/TWL LRG LVL3 (GOWN DISPOSABLE) ×4
K-WIRE 2X5 SS THRDED S3 (WIRE) ×4
KIT BASIN OR (CUSTOM PROCEDURE TRAY) ×2 IMPLANT
KIT TURNOVER KIT B (KITS) ×2 IMPLANT
KWIRE 2X5 SS THRDED S3 (WIRE) IMPLANT
MANIFOLD NEPTUNE II (INSTRUMENTS) ×2 IMPLANT
NDL HYPO 25GX1X1/2 BEV (NEEDLE) IMPLANT
NEEDLE HYPO 25GX1X1/2 BEV (NEEDLE) ×2 IMPLANT
NS IRRIG 1000ML POUR BTL (IV SOLUTION) ×3 IMPLANT
PACK SHOULDER (CUSTOM PROCEDURE TRAY) ×2 IMPLANT
PACK UNIVERSAL I (CUSTOM PROCEDURE TRAY) ×2 IMPLANT
PAD ARMBOARD 7.5X6 YLW CONV (MISCELLANEOUS) ×4 IMPLANT
PEG LOCKING 3.2X 28MM (Peg) ×2 IMPLANT
PEG LOCKING 3.2X34 (Screw) ×2 IMPLANT
PEG LOCKING 3.2X50 (Screw) ×1 IMPLANT
PEG LOCKING 3.2X52 (Peg) ×1 IMPLANT
PLATE PROX HUM LO R 4H 83 (Plate) ×1 IMPLANT
SCREW LOCK CORT STAR 3.5X24 (Screw) ×1 IMPLANT
SCREW LP NL T15 3.5X24 (Screw) ×2 IMPLANT
SCREW PEG LOCK 3.2X30MM (Screw) ×1 IMPLANT
SLING ARM IMMOBILIZER LRG (SOFTGOODS) ×1 IMPLANT
SPONGE LAP 18X18 RF (DISPOSABLE) ×1 IMPLANT
SPONGE LAP 4X18 RFD (DISPOSABLE) ×1 IMPLANT
STAPLER VISISTAT 35W (STAPLE) IMPLANT
SUCTION FRAZIER HANDLE 10FR (MISCELLANEOUS) ×2
SUCTION TUBE FRAZIER 10FR DISP (MISCELLANEOUS) ×1 IMPLANT
SUPPORT WRAP ARM LG (MISCELLANEOUS) ×3 IMPLANT
SUT FIBERWIRE #2 38 REV NDL BL (SUTURE) ×6
SUT FIBERWIRE #2 38 T-5 BLUE (SUTURE)
SUT VIC AB 0 CT1 27 (SUTURE) ×2
SUT VIC AB 0 CT1 27XBRD ANBCTR (SUTURE) IMPLANT
SUT VIC AB 0 CTB1 27 (SUTURE) IMPLANT
SUT VIC AB 2-0 CT1 27 (SUTURE) ×4
SUT VIC AB 2-0 CT1 TAPERPNT 27 (SUTURE) ×1 IMPLANT
SUT VIC AB 3-0 FS2 27 (SUTURE) ×3 IMPLANT
SUTURE FIBERWR #2 38 T-5 BLUE (SUTURE) IMPLANT
SUTURE FIBERWR#2 38 REV NDL BL (SUTURE) IMPLANT
SYR BULB IRRIGATION 50ML (SYRINGE) ×2 IMPLANT
SYR CONTROL 10ML LL (SYRINGE) IMPLANT
TAPE STRIPS DRAPE STRL (GAUZE/BANDAGES/DRESSINGS) ×1 IMPLANT
TOWEL OR 17X24 6PK STRL BLUE (TOWEL DISPOSABLE) ×2 IMPLANT
TOWEL OR 17X26 10 PK STRL BLUE (TOWEL DISPOSABLE) ×2 IMPLANT
WATER STERILE IRR 1000ML POUR (IV SOLUTION) ×2 IMPLANT

## 2018-04-24 NOTE — Progress Notes (Signed)
PROGRESS NOTE    Katherine [name]  UQJ:[id number] DOB: 1956-02-03 DOA: 04/22/2018 PCP: Mayra Neer, MD  Brief Narrative: This is a 63 year old morbidly obese female with history of diabetes, hypertension, and dyslipidemia, subdural hematoma, left foot drop, gait disorder, Nash/cirrhosis sleep apnea, chronic kidney disease stage III presented following a mechanical fall to the ED with left thigh and right shoulder pain, the emergency room patient was noted to have a left intertrochanteric femur fracture and right comminuted proximal humerus fracture  Assessment & Plan:     Closed comminuted intertrochanteric fracture of proximal femur, left, and comminuted fracture of right humerus -Ortho consulting, underwent staged surgical intervention with the left femur repaired yesterday, and just underwent ORIF of R prox humerus now -high risk of VTE and will need DVT proph, post op from tonight or tomorrow -anticipate will need ST rehab -She is at moderate cardiopulmonary risk for cardiac and pulmonary complications due to multitude of medical problems especially morbid obesity and cirrhosis, no further work-up recommended, RISK OF HYPERCARBIA and OVERSEDATION: caution w/ sedating meds -Continue home beta-blocker -PT eval from tomorrow  Type 2 diabetes mellitus -Poorly controlled, on Victoza and [name] at baseline -now stable, continue 60 units daily, sliding scale insulin, monitor  NASH/cirrhosis -Stable, at risk of fluid overload -Not on diuretics at baseline -albumin is 2.9 and INR is 1.18  Hypertension -Continue Bystolic, hold lisinopril  Morbid obesity/OSA -Does not use CPAP bedtime, lifestyle modification needed  Chronic kidney disease stage III -Stable, monitor  Depression/anxiety -Continue Klonopin and Cymbalta  Gait disorder left foot drop -Will need physical therapy and short-term rehab postop  DVT ppx: SCD Code Status: Full code Family Communication: none at bedside,  seen in PACU Disposition Plan:  SNF in 2days if stable Consults : Dr. Levy Sjogren    Procedures: L femur repair 1/21  R humerus: orif 2/22  Antimicrobials:    Subjective: -seen in PACU, Drowsy, but stable overall  Objective: Vitals:   04/23/18 1818 04/23/18 2152 04/24/18 0625 04/24/18 0745  BP: 99/83 (!) 106/58 106/61   Pulse: 84 91 91   Resp:  17 16   Temp: 97.7 F (36.5 C) 98.2 F (36.8 C) 98.5 F (36.9 C)   TempSrc: Oral Oral Oral   SpO2: 93% 96% 94% 95%  Weight:      Height:        Intake/Output Summary (Last 24 hours) at 04/24/2018 1327 Last data filed at 04/24/2018 1300 Gross per 24 hour  Intake 3225 ml  Output 1810 ml  Net 1415 ml   Filed Weights   04/22/18 2008 04/23/18 0022 04/23/18 1223  Weight: 125.2 kg 130.4 kg 129.7 kg    Examination:  Gen: Morbidly obese, drowsy post op in PACU HEENT: PERRLA, Neck supple, no JVD Lungs: poor effort, decreased at both bases CVS: RRR,No Gallops,Rubs or new Murmurs Abd: soft, Non tender, non distended, BS present Extremities: L foot w/ dressing, R arm sling/dressing Skin: no new rashes Psychiatry: Judgement and insight appear normal. Mood & affect appropriate.     Data Reviewed:   CBC: Recent Labs  Lab 04/22/18 2013 04/23/18 0552 04/24/18 0114  WBC 7.2 16.3* 13.0*  NEUTROABS 5.0  --   --   HGB 12.5 11.2* 8.1*  HCT 40.9 35.3* 25.2*  MCV 84.7 83.5 83.2  PLT PLATELET CLUMPS NOTED ON SMEAR, COUNT APPEARS DECREASED 144* 98*   Basic Metabolic Panel: Recent Labs  Lab 04/22/18 2013 04/23/18 0552 04/24/18 0114  NA 136 137 135  K  3.7 4.0 4.3  CL 101 98 101  CO2 30 29 24   GLUCOSE 208* 149* 231*  BUN 24* 26* 33*  CREATININE 1.19* 1.24* 1.34*  CALCIUM 9.5 8.8* 8.2*   GFR: Estimated Creatinine Clearance: 60.1 mL/min (A) (by C-G formula based on SCr of 1.34 mg/dL (H)). Liver Function Tests: Recent Labs  Lab 04/23/18 0552 04/24/18 0114  AST 47* 57*  ALT 27 26  ALKPHOS 66 51  BILITOT 0.8 0.8    PROT 7.0 6.3*  ALBUMIN 2.9* 2.9*   No results for input(s): LIPASE, AMYLASE in the last 168 hours. Recent Labs  Lab 04/23/18 0552  AMMONIA 28   Coagulation Profile: Recent Labs  Lab 04/23/18 0552  INR 1.18   Cardiac Enzymes: No results for input(s): CKTOTAL, CKMB, CKMBINDEX, TROPONINI in the last 168 hours. BNP (last 3 results) No results for input(s): PROBNP in the last 8760 hours. HbA1C: No results for input(s): HGBA1C in the last 72 hours. CBG: Recent Labs  Lab 04/23/18 1221 04/23/18 1621 04/23/18 1828 04/23/18 2148 04/24/18 0742  GLUCAP 113* 195* 188* 187* 178*   Lipid Profile: No results for input(s): CHOL, HDL, LDLCALC, TRIG, CHOLHDL, LDLDIRECT in the last 72 hours. Thyroid Function Tests: No results for input(s): TSH, T4TOTAL, FREET4, T3FREE, THYROIDAB in the last 72 hours. Anemia Panel: No results for input(s): VITAMINB12, FOLATE, FERRITIN, TIBC, IRON, RETICCTPCT in the last 72 hours. Urine analysis:    Component Value Date/Time   COLORURINE YELLOW 09/30/2017 1818   APPEARANCEUR CLEAR 09/30/2017 1818   LABSPEC 1.019 09/30/2017 1818   PHURINE 5.0 09/30/2017 1818   GLUCOSEU 50 (A) 09/30/2017 1818   HGBUR NEGATIVE 09/30/2017 1818   BILIRUBINUR NEGATIVE 09/30/2017 1818   KETONESUR NEGATIVE 09/30/2017 1818   PROTEINUR NEGATIVE 09/30/2017 1818   UROBILINOGEN 1.0 11/21/2014 1955   NITRITE NEGATIVE 09/30/2017 1818   LEUKOCYTESUR NEGATIVE 09/30/2017 1818   Sepsis Labs: @LABRCNTIP (procalcitonin:4,lacticidven:4)  ) Recent Results (from the past 240 hour(s))  Surgical pcr screen     Status: None   Collection Time: 04/23/18  2:21 AM  Result Value Ref Range Status   MRSA, PCR NEGATIVE NEGATIVE Final   Staphylococcus aureus NEGATIVE NEGATIVE Final    Comment: (NOTE) The Xpert SA Assay (FDA approved for NASAL specimens in patients 54 years of age and older), is one component of a comprehensive surveillance program. It is not intended to diagnose infection  nor to guide or monitor treatment. Performed at [facility], [place] [address], [place], [place] [address]          Radiology Studies: Dg Chest 1 View  Result Date: 04/22/2018 CLINICAL DATA:  63 year old female status post fall at home tonight. EXAM: CHEST  1 VIEW COMPARISON:  Chest radiograph 08/18/2017 and earlier. FINDINGS: AP supine view at 2106 hours. Stable mediastinal contours, cardiac size at the upper limits of normal. Visualized tracheal air column is within normal limits. Chronic bilateral pulmonary interstitial markings. Otherwise allowing for portable technique the lungs are clear. No pneumothorax or pleural effusion is evident. Chronic right lateral 4th rib fracture appears stable. There is probably also a chronic left lateral 7th rib fracture. Evidence of proximal right humerus fracture. IMPRESSION: 1.  No acute cardiopulmonary abnormality. 2. Proximal right humerus fracture suspected. 3. Occasional chronic rib fractures. Electronically Signed   By: Genevie Ann M.D.   On: 04/22/2018 21:48   Dg Pelvis 1-2 Views  Result Date: 04/22/2018 CLINICAL DATA:  63 y/o  F; fall with left femur pain. EXAM:  PELVIS - 1-2 VIEW; LEFT FEMUR 2 VIEWS COMPARISON:  None. FINDINGS: Pelvis: There is no evidence of pelvic fracture or diastasis. No pelvic bone lesions are seen. Left femur: Acute 3 part intertrochanteric fracture of the left proximal femur extending into the proximal diaphysis. The component of the fracture extending into the femoral shaft demonstrates mild lateral apex angulation and slight displacement. The component including the lesser trochanter is 1/2 shaft's with displaced medially. No hip joint dislocation. IMPRESSION: Acute comminuted intratrochanteric fracture of the left proximal femur extending into proximal diaphysis with angulation and displacement of fracture components. No hip dislocation. No pelvic fracture identified Electronically Signed   By: Kristine Garbe M.D.    On: 04/22/2018 21:51   Dg Shoulder Right  Result Date: 04/22/2018 CLINICAL DATA:  63 year old female status post fall at home tonight. EXAM: RIGHT SHOULDER - 2+ VIEW COMPARISON:  Prior chest radiographs 08/18/2017. FINDINGS: Comminuted and impacted proximal right humerus fracture. The humeral head appears comminuted, with an impacted transverse fracture through the humeral neck. Medial displacement of 1/2 shaft with. The humeral head appears to remain normally aligned with the glenoid. Right clavicle and scapula appear intact. Chronic right lateral 4th rib fracture. IMPRESSION: Comminuted, impacted and medially displaced proximal right humerus fracture. Electronically Signed   By: Genevie Ann M.D.   On: 04/22/2018 21:49   Ct Shoulder Right Wo Contrast  Result Date: 04/22/2018 CLINICAL DATA:  Right shoulder fracture EXAM: CT OF THE UPPER RIGHT EXTREMITY WITHOUT CONTRAST TECHNIQUE: Multidetector CT imaging of the upper right extremity was performed according to the standard protocol. COMPARISON:  Same day radiographs of the right shoulder FINDINGS: Bones/Joint/Cartilage Acute comminuted surgical neck and greater tuberosity fracture of the humeral head with angulation greater than 45 degrees of the humeral head relative to the anteriorly displaced shaft is identified. Fracture of the greater tuberosity also demonstrates displacement of the fracture fragment and angulation relative to the shaft. The acromioclavicular and glenohumeral joints maintain their articulations. No significant joint effusion. The included ribs appear intact. No scapular fracture. Ligaments Suboptimally assessed by CT. Muscles and Tendons Intramuscular hemorrhage or significant atrophy. Soft tissues No soft tissue hematoma. Mild soft tissue induration overlying the shoulder consistent soft tissue contusion. IMPRESSION: Acute comminuted surgical neck and greater tuberosity fracture of the humeral head with angulation greater than 45 degrees of  the humeral head relative to the anteriorly displaced shaft is identified. Fracture of the greater tuberosity also demonstrates displacement of the fracture fragment and angulation relative to the shaft. Findings would suggest a Neer category 3 part fracture the right proximal humerus. Electronically Signed   By: Ashley Royalty M.D.   On: 04/22/2018 23:25   Dg C-arm 1-60 Min  Result Date: 04/23/2018 CLINICAL DATA:  ORIF left femur. EXAM: LEFT FEMUR 2 VIEWS; DG C-ARM 61-120 MIN COMPARISON:  04/22/2018. FINDINGS: ORIF proximal left femur. Hardware intact. Near anatomic alignment. Fluoro time 2 minutes 59.3 seconds. Seven images obtained. IMPRESSION: ORIF proximal left femur. Electronically Signed   By: Marcello Moores  Register   On: 04/23/2018 16:12   Dg Femur Min 2 Views Left  Result Date: 04/23/2018 CLINICAL DATA:  ORIF left femur. EXAM: LEFT FEMUR 2 VIEWS; DG C-ARM 61-120 MIN COMPARISON:  04/22/2018. FINDINGS: ORIF proximal left femur. Hardware intact. Near anatomic alignment. Fluoro time 2 minutes 59.3 seconds. Seven images obtained. IMPRESSION: ORIF proximal left femur. Electronically Signed   By: [place]   On: 04/23/2018 16:12   Dg Femur Min 2 Views Left  Result Date:  04/22/2018 CLINICAL DATA:  63 y/o  F; fall with left femur pain. EXAM: PELVIS - 1-2 VIEW; LEFT FEMUR 2 VIEWS COMPARISON:  None. FINDINGS: Pelvis: There is no evidence of pelvic fracture or diastasis. No pelvic bone lesions are seen. Left femur: Acute 3 part intertrochanteric fracture of the left proximal femur extending into the proximal diaphysis. The component of the fracture extending into the femoral shaft demonstrates mild lateral apex angulation and slight displacement. The component including the lesser trochanter is 1/2 shaft's with displaced medially. No hip joint dislocation. IMPRESSION: Acute comminuted intratrochanteric fracture of the left proximal femur extending into proximal diaphysis with angulation and displacement of  fracture components. No hip dislocation. No pelvic fracture identified Electronically Signed   By: Kristine Garbe M.D.   On: 04/22/2018 21:51        Scheduled Meds: . chlorhexidine  60 mL Topical Once  . [MAR Hold] DULoxetine  60 mg Oral QHS  . [MAR Hold] feeding supplement (GLUCERNA SHAKE)  237 mL Oral BID BM  . [MAR Hold] gabapentin  300 mg Oral BID  . [MAR Hold] insulin aspart  0-9 Units Subcutaneous TID WC  . [MAR Hold] insulin glargine  60 Units Subcutaneous QHS  . [MAR Hold] levothyroxine  25 mcg Oral Q0600  . [MAR Hold] multivitamin with minerals  1 tablet Oral Daily  . [MAR Hold] nebivolol  10 mg Oral Daily  . [MAR Hold] pantoprazole  40 mg Oral BID  . [MAR Hold] rosuvastatin  40 mg Oral q1800   Continuous Infusions: . acetaminophen    . lactated ringers 10 mL/hr at 04/24/18 0901     LOS: 2 days    Time spent: 13mn    PDomenic Polite MD [facility]  04/24/2018, 1:27 PM

## 2018-04-24 NOTE — Transfer of Care (Signed)
Immediate Anesthesia Transfer of Care Note  Patient: Katherine [name]  Procedure(s) Performed: OPEN REDUCTION INTERNAL FIXATION (ORIF) PROXIMAL HUMERUS FRACTURE (Right Arm Upper)  Patient Location: PACU  Anesthesia Type:General  Level of Consciousness: awake, alert , oriented and patient cooperative  Airway & Oxygen Therapy: Patient Spontanous Breathing and Patient connected to nasal cannula oxygen  Post-op Assessment: Report given to RN and Post -op Vital signs reviewed and stable  Post vital signs: Reviewed and stable  Last Vitals:  Vitals Value Taken Time  BP 135/50 04/24/2018  1:35 PM  Temp    Pulse 98 04/24/2018  1:38 PM  Resp 12 04/24/2018  1:38 PM  SpO2 99 % 04/24/2018  1:38 PM  Vitals shown include unvalidated device data.  Last Pain:  Vitals:   04/24/18 0745  TempSrc:   PainSc: 9       Patients Stated Pain Goal: 2 (65/53/74 8270)  Complications: No apparent anesthesia complications

## 2018-04-24 NOTE — Anesthesia Preprocedure Evaluation (Signed)
Anesthesia Evaluation  Patient identified by MRN, date of birth, ID band Patient awake    Reviewed: Allergy & Precautions, H&P , NPO status , Patient's Chart, lab work & pertinent test results  History of Anesthesia Complications Negative for: history of anesthetic complications  Airway Mallampati: II  TM Distance: >3 FB Neck ROM: Full    Dental  (+) Teeth Intact, Dental Advisory Given   Pulmonary shortness of breath, sleep apnea , Current Smoker, former smoker,    breath sounds clear to auscultation       Cardiovascular hypertension, On Medications, Pt. on medications and Pt. on home beta blockers  Rhythm:Regular Rate:Normal     Neuro/Psych  Headaches, PSYCHIATRIC DISORDERS Anxiety Depression  Neuromuscular disease    GI/Hepatic GERD  Medicated and Controlled,(+) Hepatitis -  Endo/Other  diabetes, Type 1, Insulin DependentHypothyroidism Morbid obesity  Renal/GU CRFRenal disease     Musculoskeletal  (+) Arthritis , Osteoarthritis,    Abdominal (+) + obese,   Peds  Hematology negative hematology ROS (+) anemia ,   Anesthesia Other Findings   Reproductive/Obstetrics negative OB ROS                             Anesthesia Physical Anesthesia Plan  ASA: III  Anesthesia Plan: General and Regional   Post-op Pain Management:    Induction: Intravenous  PONV Risk Score and Plan: 3 and Ondansetron and Dexamethasone  Airway Management Planned: Oral ETT  Additional Equipment: None  Intra-op Plan:   Post-operative Plan: Extubation in OR  Informed Consent: I have reviewed the patients History and Physical, chart, labs and discussed the procedure including the risks, benefits and alternatives for the proposed anesthesia with the patient or authorized representative who has indicated his/her understanding and acceptance.     Dental advisory given  Plan Discussed with: CRNA and  Surgeon  Anesthesia Plan Comments:         Anesthesia Quick Evaluation

## 2018-04-24 NOTE — Anesthesia Procedure Notes (Signed)
Procedure Name: Intubation Date/Time: 04/24/2018 10:11 AM Performed by: Cleda Daub, CRNA Pre-anesthesia Checklist: Patient identified, Emergency Drugs available, Suction available and Patient being monitored Patient Re-evaluated:Patient Re-evaluated prior to induction Oxygen Delivery Method: Circle system utilized Preoxygenation: Pre-oxygenation with 100% oxygen Induction Type: IV induction Ventilation: Mask ventilation without difficulty and Mask ventilation throughout procedure Laryngoscope Size: Mac and 3 Grade View: Grade I Tube type: Oral Tube size: 7.0 mm Number of attempts: 1 Airway Equipment and Method: Stylet Placement Confirmation: ETT inserted through vocal cords under direct vision,  positive ETCO2 and breath sounds checked- equal and bilateral Secured at: 21 cm Tube secured with: Tape Dental Injury: Teeth and Oropharynx as per pre-operative assessment  Future Recommendations: Recommend- induction with short-acting agent, and alternative techniques readily available

## 2018-04-24 NOTE — Progress Notes (Signed)
The patient has been re-examined, and the chart reviewed, and there have been no interval changes to the documented history and physical.    The risks, benefits, and alternatives have been discussed at length, and the patient is willing to proceed.    Still numb from exparel block.   Johnny Bridge, MD

## 2018-04-24 NOTE — Op Note (Signed)
04/22/2018 - 04/24/2018  12:59 PM  PATIENT:  Katherine [name]    PRE-OPERATIVE DIAGNOSIS: Right proximal humerus fracture with involvement of the tuberosities  POST-OPERATIVE DIAGNOSIS:  Same  PROCEDURE:  OPEN REDUCTION INTERNAL FIXATION (ORIF) PROXIMAL HUMERUS FRACTURE with tuberosity repair  SURGEON:  Johnny Bridge, MD  PHYSICIAN ASSISTANT: Joya Gaskins, OPA-C, present and scrubbed throughout the case, critical for completion in a timely fashion, and for retraction, instrumentation, and closure.  ANESTHESIA:   General with Exparel block placed yesterday  ESTIMATED BLOOD LOSS: 250 mL  PREOPERATIVE INDICATIONS:  Katherine [name] is a  63 y.o. female with a diagnosis of Right humerus fracture who elected for surgical management.  She had pre-existing traumatic brain injury, with some left-sided weakness, fell and broke her left femur, and right proximal humerus.  She underwent staged surgical intervention with the left femur repaired yesterday, and then was brought back for more surgery today for the right shoulder.  The risks benefits and alternatives were discussed with the patient including but not limited to the risks of nonoperative treatment, versus surgical intervention including infection, bleeding, nerve injury, malunion, nonunion, the need for revision surgery, hardware prominence, hardware failure, the need for hardware removal, blood clots, cardiopulmonary complications, conversion to arthroplasty, morbidity, mortality, among others, and they were willing to proceed.  Predicted outcome is good, although there will be at least a six to nine month expected recovery.   OPERATIVE IMPLANTS: Biomet ALPS proximal humerus locking plate.  OPERATIVE FINDINGS: Displaced proximal humerus fracture.  There was a small segmental nature to the anterior cortex, bone quality was mediocre.  UNIQUE ASPECTS OF THE CASE: Access was slightly challenging given her morbid obesity, however I was able  to achieve satisfactory reduction.  Ultimately I had excellent fixation in the head, I may have just been in a tiny bit of varus still, but given the challenges of reduction, and access, I was overall very satisfied with the position of the calcar screw, as well as the fixation in the head, everything moved as a single unit, and I was able to repair the tuberosities with 2 sutures in the posterior aspect, and 1 in the subscapularis.  The anterior and posterior drill was penetrating through the anterior and posterior aspect of the head I suspect because of the tuberosity fractures.  I had excellent containment in the midline with the central 3 pegs.  OPERATIVE PROCEDURE: The patient was brought to the operating room and placed in the supine position. General anesthesia was administered.  Foley was administered.  IV antibiotics were given.  She did get both vancomycin and Ancef given history of MRSA colonization in the nose, although her current MRSA swab was negative. She was placed in the beach chair position. All bony prominences were padded. The upper extremity was prepped and draped in usual sterile fashion. Deltopectoral incision was performed.  I exposed the fracture site, and placed deep retractors. I did not tenotomize the biceps tendon. This was left in place. I elevated a small portion of the deltoid off of the shaft, in order to gain access for the plate. I placed supraspinatus and subscapularis stitches, and then reduced the head onto the shaft. This was maintained in satisfactory position.  I applied the plate and secured it into the sliding hole first. I confirmed position of the reduction and the plate with C-arm, and I placed a total of 2 guidewires into the appropriate position in the head. I was satisfied that the plate was  distal appropriately, and then secured the plate proximally with smooth pegs, taking care to prevent penetration into the arch articular surface, using C-arm, as well as  manual feel using a hand drill.  I then secured the plate distally using another cortical screw. I then passed the FiberWire sutures from the subscapularis and supraspinatus through the plate and secured the tuberosities. Once complete fixation and reduction of been achieved, took final C-arm pictures, and irrigated the wounds copiously, and repaired the deltopectoral interval with Vicryl followed by Vicryl for the subcutaneous tissue with Monocryl and Steri-Strips for the skin. She was placed in a sling. She had a preoperative regional block as well. She tolerated the procedure well with no complications.

## 2018-04-25 ENCOUNTER — Encounter (HOSPITAL_COMMUNITY): Payer: Self-pay | Admitting: Orthopedic Surgery

## 2018-04-25 LAB — GLUCOSE, CAPILLARY
Glucose-Capillary: 115 mg/dL — ABNORMAL HIGH (ref 70–99)
Glucose-Capillary: 187 mg/dL — ABNORMAL HIGH (ref 70–99)
Glucose-Capillary: 180 mg/dL — ABNORMAL HIGH (ref 70–99)
Glucose-Capillary: 164 mg/dL — ABNORMAL HIGH (ref 70–99)
Glucose-Capillary: 145 mg/dL — ABNORMAL HIGH (ref 70–99)

## 2018-04-25 LAB — CBC
HCT: 23.9 % — ABNORMAL LOW (ref 36.0–46.0)
HCT: 24.1 % — ABNORMAL LOW (ref 36.0–46.0)
HEMOGLOBIN: 7.7 g/dL — AB (ref 12.0–15.0)
Hemoglobin: 7.7 g/dL — ABNORMAL LOW (ref 12.0–15.0)
MCH: 27.2 pg (ref 26.0–34.0)
MCH: 26.9 pg (ref 26.0–34.0)
MCHC: 32.2 g/dL (ref 30.0–36.0)
MCHC: 32 g/dL (ref 30.0–36.0)
MCV: 84.5 fL (ref 80.0–100.0)
MCV: 84.3 fL (ref 80.0–100.0)
Platelets: 76 10*3/uL — ABNORMAL LOW (ref 150–400)
Platelets: 85 10*3/uL — ABNORMAL LOW (ref 150–400)
RBC: 2.83 MIL/uL — ABNORMAL LOW (ref 3.87–5.11)
RBC: 2.86 MIL/uL — ABNORMAL LOW (ref 3.87–5.11)
RDW: 14.7 % (ref 11.5–15.5)
RDW: 14.8 % (ref 11.5–15.5)
WBC: 8.5 10*3/uL (ref 4.0–10.5)
WBC: 8.4 10*3/uL (ref 4.0–10.5)
nRBC: 0 % (ref 0.0–0.2)
nRBC: 0.4 % — ABNORMAL HIGH (ref 0.0–0.2)

## 2018-04-25 LAB — BASIC METABOLIC PANEL
ANION GAP: 6 (ref 5–15)
BUN: 31 mg/dL — ABNORMAL HIGH (ref 8–23)
CO2: 27 mmol/L (ref 22–32)
Calcium: 8.1 mg/dL — ABNORMAL LOW (ref 8.9–10.3)
Chloride: 102 mmol/L (ref 98–111)
Creatinine, Ser: 1.13 mg/dL — ABNORMAL HIGH (ref 0.44–1.00)
GFR calc Af Amer: >60 mL/min (ref >60)
GFR calc non Af Amer: 52 mL/min — ABNORMAL LOW (ref >60)
Glucose, Bld: 126 mg/dL — ABNORMAL HIGH (ref 70–99)
Potassium: 4 mmol/L (ref 3.5–5.1)
SODIUM: 135 mmol/L (ref 135–145)

## 2018-04-25 LAB — COMPREHENSIVE METABOLIC PANEL
ALT: 19 U/L (ref 0–44)
AST: 53 U/L — ABNORMAL HIGH (ref 15–41)
Albumin: 2.7 g/dL — ABNORMAL LOW (ref 3.5–5.0)
Alkaline Phosphatase: 40 U/L (ref 38–126)
Anion gap: 10 (ref 5–15)
BUN: 34 mg/dL — ABNORMAL HIGH (ref 8–23)
CO2: 22 mmol/L (ref 22–32)
CREATININE: 1.19 mg/dL — AB (ref 0.44–1.00)
Calcium: 7.8 mg/dL — ABNORMAL LOW (ref 8.9–10.3)
Chloride: 103 mmol/L (ref 98–111)
GFR calc Af Amer: 57 mL/min — ABNORMAL LOW (ref >60)
GFR calc non Af Amer: 49 mL/min — ABNORMAL LOW (ref >60)
Glucose, Bld: 129 mg/dL — ABNORMAL HIGH (ref 70–99)
Potassium: 4 mmol/L (ref 3.5–5.1)
Sodium: 135 mmol/L (ref 135–145)
Total Bilirubin: 0.9 mg/dL (ref 0.3–1.2)
Total Protein: 5.5 g/dL — ABNORMAL LOW (ref 6.5–8.1)

## 2018-04-25 LAB — PREPARE RBC (CROSSMATCH)

## 2018-04-25 MED ORDER — DOCUSATE SODIUM 100 MG PO CAPS
100.0000 mg | ORAL_CAPSULE | Freq: Two times a day (BID) | ORAL | Status: DC
  Administered 2018-04-25 – 2018-04-27 (×5): 100 mg via ORAL
  Filled 2018-04-25 (×4): qty 1

## 2018-04-25 MED ORDER — SODIUM CHLORIDE 0.9% IV SOLUTION
Freq: Once | INTRAVENOUS | Status: AC
  Administered 2018-04-25: 14:00:00 via INTRAVENOUS

## 2018-04-25 MED ORDER — METOCLOPRAMIDE HCL 5 MG/ML IJ SOLN: 5.0000 mg | Freq: Three times a day (TID) | INTRAMUSCULAR | Status: DC | PRN

## 2018-04-25 MED ORDER — OXYCODONE HCL 5 MG PO TABS
10.0000 mg | ORAL_TABLET | ORAL | Status: DC | PRN
  Administered 2018-04-25 – 2018-04-26 (×3): 15 mg via ORAL
  Administered 2018-04-26 – 2018-04-28 (×6): 10 mg via ORAL
  Administered 2018-04-28 (×2): 15 mg via ORAL
  Administered 2018-04-29 (×2): 10 mg via ORAL
  Filled 2018-04-25: qty 2
  Filled 2018-04-25: qty 3
  Filled 2018-04-25: qty 2
  Filled 2018-04-25 (×3): qty 3
  Filled 2018-04-25 (×3): qty 2
  Filled 2018-04-25: qty 3

## 2018-04-25 MED ORDER — OXYCODONE HCL 5 MG PO TABS
5.0000 mg | ORAL_TABLET | ORAL | Status: DC | PRN
  Administered 2018-04-25 – 2018-04-29 (×5): 10 mg via ORAL
  Filled 2018-04-25 (×9): qty 2

## 2018-04-25 MED ORDER — POLYETHYLENE GLYCOL 3350 17 G PO PACK: 17.0000 g | PACK | Freq: Every day | ORAL | Status: DC | PRN

## 2018-04-25 MED ORDER — POLYETHYLENE GLYCOL 3350 17 G PO PACK
17.0000 g | PACK | Freq: Every day | ORAL | Status: DC
  Administered 2018-04-27 – 2018-04-28 (×2): 17 g via ORAL
  Filled 2018-04-25 (×4): qty 1

## 2018-04-25 MED ORDER — ALUM & MAG HYDROXIDE-SIMETH 200-200-20 MG/5ML PO SUSP: 30.0000 mL | ORAL | Status: DC | PRN

## 2018-04-25 MED ORDER — ZOLPIDEM TARTRATE 5 MG PO TABS: 5.0000 mg | ORAL_TABLET | Freq: Every evening | ORAL | Status: DC | PRN

## 2018-04-25 MED ORDER — BISACODYL 10 MG RE SUPP: 10.0000 mg | Freq: Every day | RECTAL | Status: DC | PRN

## 2018-04-25 MED ORDER — POTASSIUM CHLORIDE IN NACL 20-0.45 MEQ/L-% IV SOLN
INTRAVENOUS | Status: DC
  Administered 2018-04-25: 09:00:00 via INTRAVENOUS
  Filled 2018-04-25: qty 1000

## 2018-04-25 MED ORDER — HYDROMORPHONE HCL 1 MG/ML IJ SOLN
0.5000 mg | INTRAMUSCULAR | Status: DC | PRN
  Administered 2018-04-29: 0.5 mg via INTRAVENOUS
  Filled 2018-04-25 (×2): qty 1

## 2018-04-25 MED ORDER — MENTHOL 3 MG MT LOZG: 1.0000 | LOZENGE | OROMUCOSAL | Status: DC | PRN

## 2018-04-25 MED ORDER — ACETAMINOPHEN 325 MG PO TABS
325.0000 mg | ORAL_TABLET | Freq: Four times a day (QID) | ORAL | Status: DC | PRN
  Administered 2018-04-25: 650 mg via ORAL
  Filled 2018-04-25: qty 2

## 2018-04-25 MED ORDER — METOCLOPRAMIDE HCL 5 MG PO TABS: 5.0000 mg | ORAL_TABLET | Freq: Three times a day (TID) | ORAL | Status: DC | PRN

## 2018-04-25 MED ORDER — PHENOL 1.4 % MT LIQD: 1.0000 | OROMUCOSAL | Status: DC | PRN

## 2018-04-25 MED ORDER — ACETAMINOPHEN 500 MG PO TABS
1000.0000 mg | ORAL_TABLET | Freq: Four times a day (QID) | ORAL | Status: AC
  Administered 2018-04-25 – 2018-04-26 (×3): 1000 mg via ORAL
  Filled 2018-04-25 (×4): qty 2

## 2018-04-25 MED ORDER — DIPHENHYDRAMINE HCL 12.5 MG/5ML PO ELIX: 12.5000 mg | ORAL_SOLUTION | ORAL | Status: DC | PRN

## 2018-04-25 MED ORDER — CYCLOBENZAPRINE HCL 5 MG PO TABS
5.0000 mg | ORAL_TABLET | Freq: Three times a day (TID) | ORAL | Status: DC | PRN
  Administered 2018-04-26 – 2018-04-29 (×10): 5 mg via ORAL
  Filled 2018-04-25 (×10): qty 1

## 2018-04-25 NOTE — Progress Notes (Signed)
Pt's temp 100 pre blood transfusion, notified MD. Tylenol given as ordered.

## 2018-04-25 NOTE — Progress Notes (Addendum)
Pt's surgical site to left hip is saturated in blood and with mild bleeding, reinforced with abd gauze and ice pack applied. Pt's BP is 99/44, notified MD. Pt asymptomatic, denies dizziness. Will monitor pt. Pack of RBC ordered.

## 2018-04-25 NOTE — Progress Notes (Addendum)
Patient ID: Katherine [name], female   DOB: 1955-11-03, 63 y.o.   MRN: [id number]     Subjective:  Patient reports pain as mild to moderate.  Patient in bed and in no acute distress she states that she is a patient of Dr Tessa Lerner.  Objective:   VITALS:   Vitals:   04/24/18 2346 04/25/18 0139 04/25/18 0516 04/25/18 0949  BP: (!) 96/48 (!) 100/59 (!) 111/52 (!) 74/53  Pulse: 99 94 82 75  Resp:   16 20  Temp: 99.4 F (37.4 C) 98 F (36.7 C) 98.2 F (36.8 C) 98.2 F (36.8 C)  TempSrc: Oral Oral Oral Oral  SpO2: 99% 97% 97% 99%  Weight:      Height:        ABD soft Sensation intact distally Dorsiflexion/Plantar flexion intact Incision: dressing C/D/I and moderate drainage Drainage at the hip but no drainage at the shoulder  Lab Results  Component Value Date   WBC 8.5 04/25/2018   HGB 7.7 (L) 04/25/2018   HCT 23.9 (L) 04/25/2018   MCV 84.5 04/25/2018   PLT 76 (L) 04/25/2018   BMET    Component Value Date/Time   NA 135 04/25/2018 0743   NA 140 05/20/2017 0821   K 4.0 04/25/2018 0743   CL 102 04/25/2018 0743   CO2 27 04/25/2018 0743   GLUCOSE 126 (H) 04/25/2018 0743   BUN 31 (H) 04/25/2018 0743   BUN 21 05/20/2017 0821   CREATININE 1.13 (H) 04/25/2018 0743   CALCIUM 8.1 (L) 04/25/2018 0743   GFRNONAA 52 (L) 04/25/2018 0743   GFRAA >60 04/25/2018 0743     Assessment/Plan: 1 Day Post-Op   Principal Problem:   Closed comminuted intertrochanteric fracture of proximal femur, left, initial encounter (HCC) Active Problems:   Type II diabetes mellitus with renal manifestations (HCC)   HLD (hyperlipidemia)   Essential hypertension   Depression with anxiety   Hypothyroidism   GERD (gastroesophageal reflux disease)   CKD (chronic kidney disease), stage III ([place])   Fall   Closed comminuted fracture of right humerus   NASH (nonalcoholic steatohepatitis)   Liver cirrhosis ([place])   Advance diet Up with therapy May benefit from CIR WBAT left lower ext NWB right  upper ext   [name] 04/25/2018, 10:34 AM  Acute blood loss anemia: She has received a total of 2 units so far, 1 in the operating room, I ordered one last night.  She appears stable, but may continue to drift down, will recheck tomorrow.  Nonweightbearing right upper extremity, weightbearing as tolerated left lower extremity.  [name], MD Cell [phone]

## 2018-04-25 NOTE — Progress Notes (Signed)
Orthopedic Tech Progress Note Patient Details:  Katherine [name] [birth year] [id number]  Ortho Devices Type of Ortho Device: Shoulder immobilizer Ortho Device/Splint Location: replacement Ortho Device/Splint Interventions: Application   Post Interventions Patient Tolerated: Well Instructions Provided: Care of device   [place] Pink 04/25/2018, 3:23 PM

## 2018-04-25 NOTE — Progress Notes (Signed)
PROGRESS NOTE    Katherine [name]  YTK:[id number] DOB: [birth year] DOA: 04/22/2018 PCP: Mayra Neer, MD  Brief Narrative: This is a 63 year old morbidly obese female with history of diabetes, hypertension, and dyslipidemia, subdural hematoma, left foot drop, gait disorder, Nash/cirrhosis sleep apnea, chronic kidney disease stage III presented following a mechanical fall to the ED with left thigh and right shoulder pain, the emergency room patient was noted to have a left intertrochanteric femur fracture and right comminuted proximal humerus fracture  Assessment & Plan:     Closed comminuted intertrochanteric fracture of proximal femur, left, and comminuted fracture of right humerus -Ortho consulting, underwent staged surgical intervention with the left femur repaired 2/21, and then underwent ORIF of R prox humerus 2/22 -high risk of VTE and will need DVT proph from tomorrow if plt count better -anticipate will need ST rehab -RISK OF HYPERCARBIA and OVERSEDATION: caution w/ sedating meds -Continue home beta-blocker -PT eval today  Acute Blood loss anemia -and bleeding from Hip site overnight -s/p 1 unit PRBC, Hb 7.7 and BP low will give another unit of PRBC  Thrombocytopenia -acute on chronic, at basline slightly low due to Cirrhosis -now worsened w/ bleeding and plt consumption -monitor  Type 2 diabetes mellitus -Poorly controlled, on Victoza and [name] at baseline -now stable, continue 60 units daily, sliding scale insulin, monitor  NASH/cirrhosis -Stable, at risk of fluid overload -Not on diuretics at baseline -albumin is 2.9 and INR is 1.18  Hypertension -Continue Bystolic, hold lisinopril  Morbid obesity/OSA -Does not use CPAP bedtime, lifestyle modification needed  Chronic kidney disease stage III -Stable, monitor  Depression/anxiety -Continue Klonopin and Cymbalta  Gait disorder left foot drop -Will need physical therapy and short-term rehab postop  DVT  ppx: SCDs due to low plts Code Status: Full code Family Communication: none at bedside, seen in PACU Disposition Plan:  SNF in 2days if stable Consults : Dr. Levy Sjogren    Procedures: L femur repair 1/21  R humerus: orif 2/22  Antimicrobials:    Subjective: -feels ok, having muscle spasm and pain at Op site -no dyspnea  Objective: Vitals:   04/25/18 0949 04/25/18 1148 04/25/18 1151 04/25/18 1157  BP: (!) 74/53 (!) 118/58 91/68 (!) 89/73  Pulse: 75 80 80   Resp: 20     Temp: 98.2 F (36.8 C)     TempSrc: Oral     SpO2: 99%     Weight:      Height:        Intake/Output Summary (Last 24 hours) at 04/25/2018 1225 Last data filed at 04/25/2018 1033 Gross per 24 hour  Intake 2300 ml  Output 1710 ml  Net 590 ml   Filed Weights   04/22/18 2008 04/23/18 0022 04/23/18 1223  Weight: 125.2 kg 130.4 kg 129.7 kg    Examination:  Gen: Awake, Alert, Oriented X 3, no distress, morbidly obese HEENT: PERRLA, Neck supple, no JVD Lungs: decreased at bases CVS: RRR,No Gallops,Rubs or new Murmurs Abd: soft, Non tender, non distended, BS present Extremities: L hip with dressing, some blood, R shoulder w/ dressing Skin: no new rashes Psychiatry: Judgement and insight appear normal. Mood & affect appropriate.     Data Reviewed:   CBC: Recent Labs  Lab 04/22/18 2013 04/23/18 0552 04/24/18 0114 04/24/18 1620 04/25/18 0245 04/25/18 1109  WBC 7.2 16.3* 13.0* 12.4* 8.5 8.4  NEUTROABS 5.0  --   --   --   --   --   HGB 12.5 11.2* 8.1*  7.4* 7.7* 7.7*  HCT 40.9 35.3* 25.2* 22.6* 23.9* 24.1*  MCV 84.7 83.5 83.2 85.0 84.5 84.3  PLT PLATELET CLUMPS NOTED ON SMEAR, COUNT APPEARS DECREASED 144* 98* PLATELET CLUMPS NOTED ON SMEAR, COUNT APPEARS DECREASED 76* 85*   Basic Metabolic Panel: Recent Labs  Lab 04/22/18 2013 04/23/18 0552 04/24/18 0114 04/25/18 0245 04/25/18 0743  NA 136 137 135 135 135  K 3.7 4.0 4.3 4.0 4.0  CL 101 98 101 103 102  CO2 30 29 24 22 27   GLUCOSE  208* 149* 231* 129* 126*  BUN 24* 26* 33* 34* 31*  CREATININE 1.19* 1.24* 1.34* 1.19* 1.13*  CALCIUM 9.5 8.8* 8.2* 7.8* 8.1*   GFR: Estimated Creatinine Clearance: 71.3 mL/min (A) (by C-G formula based on SCr of 1.13 mg/dL (H)). Liver Function Tests: Recent Labs  Lab 04/23/18 0552 04/24/18 0114 04/25/18 0245  AST 47* 57* 53*  ALT 27 26 19   ALKPHOS 66 51 40  BILITOT 0.8 0.8 0.9  PROT 7.0 6.3* 5.5*  ALBUMIN 2.9* 2.9* 2.7*   No results for input(s): LIPASE, AMYLASE in the last 168 hours. Recent Labs  Lab 04/23/18 0552  AMMONIA 28   Coagulation Profile: Recent Labs  Lab 04/23/18 0552  INR 1.18   Cardiac Enzymes: No results for input(s): CKTOTAL, CKMB, CKMBINDEX, TROPONINI in the last 168 hours. BNP (last 3 results) No results for input(s): PROBNP in the last 8760 hours. HbA1C: No results for input(s): HGBA1C in the last 72 hours. CBG: Recent Labs  Lab 04/24/18 1335 04/24/18 1556 04/24/18 2221 04/25/18 0817 04/25/18 1213  GLUCAP 140* 155* 180* 115* 187*   Lipid Profile: No results for input(s): CHOL, HDL, LDLCALC, TRIG, CHOLHDL, LDLDIRECT in the last 72 hours. Thyroid Function Tests: No results for input(s): TSH, T4TOTAL, FREET4, T3FREE, THYROIDAB in the last 72 hours. Anemia Panel: No results for input(s): VITAMINB12, FOLATE, FERRITIN, TIBC, IRON, RETICCTPCT in the last 72 hours. Urine analysis:    Component Value Date/Time   COLORURINE YELLOW 09/30/2017 1818   APPEARANCEUR CLEAR 09/30/2017 1818   LABSPEC 1.019 09/30/2017 1818   PHURINE 5.0 09/30/2017 1818   GLUCOSEU 50 (A) 09/30/2017 1818   HGBUR NEGATIVE 09/30/2017 1818   BILIRUBINUR NEGATIVE 09/30/2017 1818   KETONESUR NEGATIVE 09/30/2017 1818   PROTEINUR NEGATIVE 09/30/2017 1818   UROBILINOGEN 1.0 11/21/2014 1955   NITRITE NEGATIVE 09/30/2017 1818   LEUKOCYTESUR NEGATIVE 09/30/2017 1818   Sepsis Labs: @LABRCNTIP (procalcitonin:4,lacticidven:4)  ) Recent Results (from the past 240 hour(s))    Surgical pcr screen     Status: None   Collection Time: 04/23/18  2:21 AM  Result Value Ref Range Status   MRSA, PCR NEGATIVE NEGATIVE Final   Staphylococcus aureus NEGATIVE NEGATIVE Final    Comment: (NOTE) The Xpert SA Assay (FDA approved for NASAL specimens in patients 14 years of age and older), is one component of a comprehensive surveillance program. It is not intended to diagnose infection nor to guide or monitor treatment. Performed at [facility], [place] [address] [place], [place] [address]          Radiology Studies: Dg Shoulder Right Port  Result Date: 04/24/2018 CLINICAL DATA:  ORIF of the right shoulder.  Right humerus fracture. EXAM: PORTABLE RIGHT SHOULDER COMPARISON:  CT 04/22/2018 and radiographs 04/22/2018 FINDINGS: ORIF of the proximal right humerus with plate and screws. Again noted is a comminuted fracture involving the humeral head. Right humeral head appears to be located on these two views. Normal alignment at the  right AC joint. IMPRESSION: Surgical fixation of the comminuted proximal humeral fracture. Electronically Signed   By: Markus Daft M.D.   On: 04/24/2018 15:29   Dg Humerus Right  Result Date: 04/24/2018 CLINICAL DATA:  Internal fixation of fracture. EXAM: DG C-ARM 61-120 MIN; RIGHT HUMERUS - 2+ VIEW COMPARISON:  Two days prior FINDINGS: 2 intraoperative views demonstrate plate and screw fixation of the proximal humerus . Alignment is improved, without acute hardware complication. IMPRESSION: Intraoperative imaging of proximal right humerus fixation Electronically Signed   By: Abigail Miyamoto M.D.   On: 04/24/2018 14:55   Dg C-arm 1-60 Min  Result Date: 04/24/2018 CLINICAL DATA:  Internal fixation of fracture. EXAM: DG C-ARM 61-120 MIN; RIGHT HUMERUS - 2+ VIEW COMPARISON:  Two days prior FINDINGS: 2 intraoperative views demonstrate plate and screw fixation of the proximal humerus . Alignment is improved, without acute hardware complication.  IMPRESSION: Intraoperative imaging of proximal right humerus fixation Electronically Signed   By: Abigail Miyamoto M.D.   On: 04/24/2018 14:55   Dg C-arm 1-60 Min  Result Date: 04/23/2018 CLINICAL DATA:  ORIF left femur. EXAM: LEFT FEMUR 2 VIEWS; DG C-ARM 61-120 MIN COMPARISON:  04/22/2018. FINDINGS: ORIF proximal left femur. Hardware intact. Near anatomic alignment. Fluoro time 2 minutes 59.3 seconds. Seven images obtained. IMPRESSION: ORIF proximal left femur. Electronically Signed   By: Marcello Moores  Register   On: 04/23/2018 16:12   Dg Femur Min 2 Views Left  Result Date: 04/23/2018 CLINICAL DATA:  ORIF left femur. EXAM: LEFT FEMUR 2 VIEWS; DG C-ARM 61-120 MIN COMPARISON:  04/22/2018. FINDINGS: ORIF proximal left femur. Hardware intact. Near anatomic alignment. Fluoro time 2 minutes 59.3 seconds. Seven images obtained. IMPRESSION: ORIF proximal left femur. Electronically Signed   By: [place]   On: 04/23/2018 16:12        Scheduled Meds: . sodium chloride   Intravenous Once  . acetaminophen  1,000 mg Oral Q6H  . docusate sodium  100 mg Oral BID  . DULoxetine  60 mg Oral QHS  . feeding supplement (GLUCERNA SHAKE)  237 mL Oral BID BM  . gabapentin  300 mg Oral QHS  . insulin aspart  0-9 Units Subcutaneous TID WC  . insulin glargine  60 Units Subcutaneous QHS  . levothyroxine  25 mcg Oral Q0600  . multivitamin with minerals  1 tablet Oral Daily  . nebivolol  10 mg Oral Daily  . pantoprazole  40 mg Oral BID  . polyethylene glycol  17 g Oral Daily  . rosuvastatin  40 mg Oral q1800   Continuous Infusions:    LOS: 3 days    Time spent: 22mn    PDomenic Polite MD [facility]  04/25/2018, 12:25 PM

## 2018-04-26 ENCOUNTER — Inpatient Hospital Stay (HOSPITAL_COMMUNITY): Payer: Medicare Other

## 2018-04-26 LAB — COMPREHENSIVE METABOLIC PANEL
ALT: 18 U/L (ref 0–44)
ANION GAP: 6 (ref 5–15)
AST: 55 U/L — ABNORMAL HIGH (ref 15–41)
Albumin: 2.7 g/dL — ABNORMAL LOW (ref 3.5–5.0)
Alkaline Phosphatase: 52 U/L (ref 38–126)
BUN: 28 mg/dL — ABNORMAL HIGH (ref 8–23)
CO2: 24 mmol/L (ref 22–32)
Calcium: 8 mg/dL — ABNORMAL LOW (ref 8.9–10.3)
Chloride: 104 mmol/L (ref 98–111)
Creatinine, Ser: 0.99 mg/dL (ref 0.44–1.00)
GFR calc Af Amer: >60 mL/min (ref >60)
GFR calc non Af Amer: >60 mL/min (ref >60)
Glucose, Bld: 122 mg/dL — ABNORMAL HIGH (ref 70–99)
POTASSIUM: 4.1 mmol/L (ref 3.5–5.1)
Sodium: 134 mmol/L — ABNORMAL LOW (ref 135–145)
TOTAL PROTEIN: 5.6 g/dL — AB (ref 6.5–8.1)
Total Bilirubin: 1 mg/dL (ref 0.3–1.2)

## 2018-04-26 LAB — GLUCOSE, CAPILLARY
GLUCOSE-CAPILLARY: 115 mg/dL — AB (ref 70–99)
Glucose-Capillary: 113 mg/dL — ABNORMAL HIGH (ref 70–99)
Glucose-Capillary: 191 mg/dL — ABNORMAL HIGH (ref 70–99)
Glucose-Capillary: 125 mg/dL — ABNORMAL HIGH (ref 70–99)

## 2018-04-26 LAB — CBC
HCT: 27.7 % — ABNORMAL LOW (ref 36.0–46.0)
HEMOGLOBIN: 8.7 g/dL — AB (ref 12.0–15.0)
MCH: 26.9 pg (ref 26.0–34.0)
MCHC: 31.4 g/dL (ref 30.0–36.0)
MCV: 85.5 fL (ref 80.0–100.0)
Platelets: 97 10*3/uL — ABNORMAL LOW (ref 150–400)
RBC: 3.24 MIL/uL — ABNORMAL LOW (ref 3.87–5.11)
RDW: 14.9 % (ref 11.5–15.5)
WBC: 6.9 10*3/uL (ref 4.0–10.5)
nRBC: 0.4 % — ABNORMAL HIGH (ref 0.0–0.2)

## 2018-04-26 MED ORDER — APIXABAN 2.5 MG PO TABS
2.5000 mg | ORAL_TABLET | Freq: Two times a day (BID) | ORAL | Status: DC
  Administered 2018-04-26 – 2018-04-29 (×7): 2.5 mg via ORAL
  Filled 2018-04-26 (×7): qty 1

## 2018-04-26 NOTE — Progress Notes (Signed)
Inpatient Rehabilitation Admissions Coordinator  Inpatient acute rehab consult received. Therapy evaluations pending. Met with patient at bedside. Previous CIR admit 2013. I will contact her daughter in law to begin discussions of rehab venue options. Patient lives with son and daughter in law.  Danne Baxter, RN, MSN Rehab Admissions Coordinator [phone] 04/26/2018 11:18 AM

## 2018-04-26 NOTE — Progress Notes (Signed)
PROGRESS NOTE    Katherine [name]  QAS:[id number] DOB: [birth year] DOA: 04/22/2018 PCP: Mayra Neer, MD  Brief Narrative: This is a 63 year old morbidly obese female with history of diabetes, hypertension, and dyslipidemia, subdural hematoma, left foot drop, gait disorder, Nash/cirrhosis sleep apnea, chronic kidney disease stage III presented following a mechanical fall to the ED with left thigh and right shoulder pain, the emergency room patient was noted to have a left intertrochanteric femur fracture and right comminuted proximal humerus fracture  Assessment & Plan:     Closed comminuted intertrochanteric fracture of proximal femur, left, and comminuted fracture of right humerus -Ortho consulting, underwent staged surgical intervention with the left femur repaired 2/21, and then underwent ORIF of R prox humerus 2/22 -high risk of VTE, started low-dose Eliquis twice daily for DVT prophylaxis, could change to daily Lovenox if platelet count trends up -Ambulate, increase activity, PT OT eval today -Also x-ray of left ankle, due to pain, but has full range of motion here  Acute Blood loss anemia -From Intra-Op blood loss, hemodilution and had postop bleeding from surgical site -No further bleeding, status post 2 units of PRBC -Hemoglobin improved to 8.7, monitor  Thrombocytopenia -acute on chronic, at basline slightly low due to Cirrhosis -now worsened w/ bleeding and plt consumption -Improving, monitor  Type 2 diabetes mellitus -Poorly controlled, on Victoza and [name] at baseline -now stable, continue 60 units daily, sliding scale insulin, monitor  NASH/cirrhosis -Stable, at risk of fluid overload -Not on diuretics at baseline -albumin is 2.9 and INR is 1.18  Hypertension -Continue Bystolic, hold lisinopril  Morbid obesity/OSA -Does not use CPAP bedtime, lifestyle modification needed  Chronic kidney disease stage III -Stable, monitor  Depression/anxiety -Continue  Klonopin and Cymbalta  Gait disorder left foot drop -PT, will need rehab  DVT ppx: SCDs due to low plts Code Status: Full code Family Communication: No family at bedside Disposition Plan:   CIR versus SNF Consults : Dr. Levy Sjogren    Procedures: L femur repair 1/21  R humerus: orif 2/22  Antimicrobials:    Subjective: -Complains of some pain at the left ankle, otherwise improving and feels better today -Denies any dyspnea, no nausea vomiting  Objective: Vitals:   04/25/18 1630 04/25/18 2203 04/25/18 2249 04/26/18 0352  BP: (!) 102/55 (!) 80/67 (!) 103/58 (!) 102/48  Pulse: 77 72 81 79  Resp: 18 20  19   Temp: 98.5 F (36.9 C) 98.5 F (36.9 C)  97.9 F (36.6 C)  TempSrc: Oral Oral  Oral  SpO2: 95% 100%  98%  Weight:      Height:        Intake/Output Summary (Last 24 hours) at 04/26/2018 1414 Last data filed at 04/26/2018 0900 Gross per 24 hour  Intake 840 ml  Output 1725 ml  Net -885 ml   Filed Weights   04/22/18 2008 04/23/18 0022 04/23/18 1223  Weight: 125.2 kg 130.4 kg 129.7 kg    Examination:  Gen: Morbidly obese, chronically ill female awake, Alert, Oriented X 3, no distress HEENT: Pupils equal reactive, neck supple Lungs: Decreased breath sounds bases, rest clear CVS: RRR,No Gallops,Rubs or new Murmurs Abd: soft, Non tender, non distended, BS present Extremities: Left hip with dressing, right shoulder with dressing Skin: no new rashes Psychiatry: Judgement and insight appear normal. Mood & affect appropriate.     Data Reviewed:   CBC: Recent Labs  Lab 04/22/18 2013  04/24/18 0114 04/24/18 1620 04/25/18 0245 04/25/18 1109 04/26/18 0707  WBC 7.2   < >  13.0* 12.4* 8.5 8.4 6.9  NEUTROABS 5.0  --   --   --   --   --   --   HGB 12.5   < > 8.1* 7.4* 7.7* 7.7* 8.7*  HCT 40.9   < > 25.2* 22.6* 23.9* 24.1* 27.7*  MCV 84.7   < > 83.2 85.0 84.5 84.3 85.5  PLT PLATELET CLUMPS NOTED ON SMEAR, COUNT APPEARS DECREASED   < > 98* PLATELET CLUMPS  NOTED ON SMEAR, COUNT APPEARS DECREASED 76* 85* 97*   < > = values in this interval not displayed.   Basic Metabolic Panel: Recent Labs  Lab 04/23/18 0552 04/24/18 0114 04/25/18 0245 04/25/18 0743 04/26/18 0432  NA 137 135 135 135 134*  K 4.0 4.3 4.0 4.0 4.1  CL 98 101 103 102 104  CO2 29 24 22 27 24   GLUCOSE 149* 231* 129* 126* 122*  BUN 26* 33* 34* 31* 28*  CREATININE 1.24* 1.34* 1.19* 1.13* 0.99  CALCIUM 8.8* 8.2* 7.8* 8.1* 8.0*   GFR: Estimated Creatinine Clearance: 81.4 mL/min (by C-G formula based on SCr of 0.99 mg/dL). Liver Function Tests: Recent Labs  Lab 04/23/18 0552 04/24/18 0114 04/25/18 0245 04/26/18 0432  AST 47* 57* 53* 55*  ALT 27 26 19 18   ALKPHOS 66 51 40 52  BILITOT 0.8 0.8 0.9 1.0  PROT 7.0 6.3* 5.5* 5.6*  ALBUMIN 2.9* 2.9* 2.7* 2.7*   No results for input(s): LIPASE, AMYLASE in the last 168 hours. Recent Labs  Lab 04/23/18 0552  AMMONIA 28   Coagulation Profile: Recent Labs  Lab 04/23/18 0552  INR 1.18   Cardiac Enzymes: No results for input(s): CKTOTAL, CKMB, CKMBINDEX, TROPONINI in the last 168 hours. BNP (last 3 results) No results for input(s): PROBNP in the last 8760 hours. HbA1C: No results for input(s): HGBA1C in the last 72 hours. CBG: Recent Labs  Lab 04/25/18 1703 04/25/18 1854 04/25/18 2246 04/26/18 0758 04/26/18 1224  GLUCAP 180* 164* 145* 115* 113*   Lipid Profile: No results for input(s): CHOL, HDL, LDLCALC, TRIG, CHOLHDL, LDLDIRECT in the last 72 hours. Thyroid Function Tests: No results for input(s): TSH, T4TOTAL, FREET4, T3FREE, THYROIDAB in the last 72 hours. Anemia Panel: No results for input(s): VITAMINB12, FOLATE, FERRITIN, TIBC, IRON, RETICCTPCT in the last 72 hours. Urine analysis:    Component Value Date/Time   COLORURINE YELLOW 09/30/2017 1818   APPEARANCEUR CLEAR 09/30/2017 1818   LABSPEC 1.019 09/30/2017 1818   PHURINE 5.0 09/30/2017 1818   GLUCOSEU 50 (A) 09/30/2017 1818   HGBUR NEGATIVE  09/30/2017 1818   BILIRUBINUR NEGATIVE 09/30/2017 1818   KETONESUR NEGATIVE 09/30/2017 1818   PROTEINUR NEGATIVE 09/30/2017 1818   UROBILINOGEN 1.0 11/21/2014 1955   NITRITE NEGATIVE 09/30/2017 1818   LEUKOCYTESUR NEGATIVE 09/30/2017 1818   Sepsis Labs: @LABRCNTIP (procalcitonin:4,lacticidven:4)  ) Recent Results (from the past 240 hour(s))  Surgical pcr screen     Status: None   Collection Time: 04/23/18  2:21 AM  Result Value Ref Range Status   MRSA, PCR NEGATIVE NEGATIVE Final   Staphylococcus aureus NEGATIVE NEGATIVE Final    Comment: (NOTE) The Xpert SA Assay (FDA approved for NASAL specimens in patients 53 years of age and older), is one component of a comprehensive surveillance program. It is not intended to diagnose infection nor to guide or monitor treatment. Performed at [facility], [place] [address] [place], [place] [address]          Radiology Studies: Dg Ankle 2 Views  Left  Result Date: 04/26/2018 CLINICAL DATA:  Left ankle pain since a fall 04/22/2018. History of remote trauma also. EXAM: LEFT ANKLE - 2 VIEW COMPARISON:  Radiographs 01/23/2012. FINDINGS: There is a remote healed distal fibular shaft fracture. The ankle mortise is maintained. No acute ankle fracture is identified. The mid and hindfoot bony structures are intact. Mild degenerative changes. Moderate size calcaneal heel spur. IMPRESSION: Remote healed distal fibular shaft fracture. No acute ankle fracture. Electronically Signed   By: Marijo Sanes M.D.   On: 04/26/2018 10:36        Scheduled Meds: . apixaban  2.5 mg Oral BID  . docusate sodium  100 mg Oral BID  . DULoxetine  60 mg Oral QHS  . feeding supplement (GLUCERNA SHAKE)  237 mL Oral BID BM  . gabapentin  300 mg Oral QHS  . insulin aspart  0-9 Units Subcutaneous TID WC  . insulin glargine  60 Units Subcutaneous QHS  . levothyroxine  25 mcg Oral Q0600  . multivitamin with minerals  1 tablet Oral Daily  . nebivolol  10 mg Oral  Daily  . pantoprazole  40 mg Oral BID  . polyethylene glycol  17 g Oral Daily  . rosuvastatin  40 mg Oral q1800   Continuous Infusions:    LOS: 4 days    Time spent: 47mn    PDomenic Polite MD [facility]  04/26/2018, 2:14 PM

## 2018-04-26 NOTE — Evaluation (Signed)
Physical Therapy Evaluation Patient Details Name: Katherine [name] MRN: [id number] DOB: 30-Mar-1955 Today's Date: 04/26/2018   History of Present Illness  This is a 63 year old morbidly obese female with history of diabetes, hypertension, and dyslipidemia, subdural hematoma, left foot drop, gait disorder, Nash/cirrhosis sleep apnea, chronic kidney disease stage III presented following a mechanical fall to the ED with left thigh and right shoulder pain, the emergency room patient was noted to have a left intertrochanteric femur fracture and right comminuted proximal humerus fracture.IM nail left femur 2/21 and ORIF right proximal humerus fx on 2/22.  Clinical Impression  Pt admitted with above diagnosis. Pt currently with functional limitations due to the deficits listed below (see PT Problem List). Pt was able to sit EOB x 10 min with min guard assist.  Could not stand with +2 max assist due to unable to weight bear on left LE due to pain today. Will follow acutely.   Pt will benefit from skilled PT to increase their independence and safety with mobility to allow discharge to the venue listed below.    Follow Up Recommendations CIR;Supervision/Assistance - 24 hour    Equipment Recommendations  3in1 (PT);Wheelchair (measurements PT);Wheelchair cushion (measurements PT)    Recommendations for Other Services       Precautions / Restrictions Precautions Precautions: Fall;Shoulder Shoulder Interventions: Shoulder sling/immobilizer;At all times Restrictions Weight Bearing Restrictions: Yes RUE Weight Bearing: Non weight bearing LLE Weight Bearing: Weight bearing as tolerated      Mobility  Bed Mobility Overal bed mobility: Needs Assistance Bed Mobility: Rolling;Supine to Sit Rolling: Max assist;+2 for physical assistance   Supine to sit: Mod assist;+2 for physical assistance     General bed mobility comments: Pt needed mod assist with assist for left LE and trunk elevation. Took incr  time for pt to sit up fully.   Transfers Overall transfer level: Needs assistance   Transfers: Sit to/from Stand Sit to Stand: Max assist;+2 physical assistance;From elevated surface         General transfer comment: Attempted x 4 x with max assit given with pt unable to clear buttocks off bed even with left UE pulling on chair back in front of her, bed elevated and use of pad to stand.  Pt unable to bear sufficient weight on left LE.   Ambulation/Gait             General Gait Details: unable  Stairs            Wheelchair Mobility    Modified Rankin (Stroke Patients Only)       Balance Overall balance assessment: Needs assistance Sitting-balance support: Single extremity supported;Feet supported Sitting balance-Leahy Scale: Fair Sitting balance - Comments: Pt sat EOB x 10 min with min guard to min assist initially until she got her left LE comfortable. right lateral lean due to pain left LE                                     Pertinent Vitals/Pain Pain Assessment: 0-10 Pain Score: 8  Pain Location: left LE and right UE, back Pain Descriptors / Indicators: Aching;Grimacing;Guarding Pain Intervention(s): Limited activity within patient's tolerance;Monitored during session;Premedicated before session;Repositioned    Home Living Family/patient expects to be discharged to:: Private residence Living Arrangements: Children Available Help at Discharge: Family;Available 24 hours/day Type of Home: House Home Access: Stairs to enter Entrance Stairs-Rails: None Entrance Stairs-Number of Steps: 4 Home  Layout: Two level;Able to live on main level with bedroom/bathroom Home Equipment: Gilford Rile - 2 wheels;Cane - single point;Toilet riser;Bedside commode;Tub bench;Shower seat;Walker - 4 wheels(bench does not fit at sons house)      Prior Function Level of Independence: Independent with assistive device(s);Needs assistance   Gait / Transfers Assistance  Needed: states she used strt cane PTA  ADL's / Homemaking Assistance Needed: States she stepped into tub to bathe  Comments: states she has had falls.      Hand Dominance   Dominant Hand: Right    Extremity/Trunk Assessment   Upper Extremity Assessment Upper Extremity Assessment: Defer to OT evaluation    Lower Extremity Assessment Lower Extremity Assessment: RLE deficits/detail;LLE deficits/detail RLE Deficits / Details: grossly 3/5 LLE Deficits / Details: grossly 1/5       Communication   Communication: No difficulties  Cognition Arousal/Alertness: Awake/alert Behavior During Therapy: WFL for tasks assessed/performed Overall Cognitive Status: Within Functional Limits for tasks assessed                                        General Comments      Exercises General Exercises - Lower Extremity Ankle Circles/Pumps: AAROM;Both;Supine;10 reps Quad Sets: AAROM;Both;10 reps;Supine Heel Slides: AAROM;Both;5 reps;Supine   Assessment/Plan    PT Assessment Patient needs continued PT services  PT Problem List Decreased activity tolerance;Decreased balance;Decreased mobility;Decreased knowledge of use of DME;Decreased safety awareness;Decreased knowledge of precautions;Pain;Obesity       PT Treatment Interventions DME instruction;Gait training;Functional mobility training;Therapeutic activities;Therapeutic exercise;Balance training;Patient/family education;Wheelchair mobility training    PT Goals (Current goals can be found in the Care Plan section)  Acute Rehab PT Goals Patient Stated Goal: to go to therapy PT Goal Formulation: With patient Time For Goal Achievement: 05/10/18 Potential to Achieve Goals: Good    Frequency Min 4X/week   Barriers to discharge   Son to build ramp    Co-evaluation               AM-PAC PT "6 Clicks" Mobility  Outcome Measure Help needed turning from your back to your side while in a flat bed without using  bedrails?: A Lot Help needed moving from lying on your back to sitting on the side of a flat bed without using bedrails?: A Lot Help needed moving to and from a bed to a chair (including a wheelchair)?: Total Help needed standing up from a chair using your arms (e.g., wheelchair or bedside chair)?: Total Help needed to walk in hospital room?: Total Help needed climbing 3-5 steps with a railing? : Total 6 Click Score: 8    End of Session Equipment Utilized During Treatment: Gait belt Activity Tolerance: Patient limited by fatigue;Patient limited by pain Patient left: in bed;with call bell/phone within reach Nurse Communication: Mobility status;Need for lift equipment PT Visit Diagnosis: Unsteadiness on feet (R26.81);Muscle weakness (generalized) (M62.81);Pain Pain - part of body: Arm;Leg(right UE and left LE, back)    Time: 0737-1062 PT Time Calculation (min) (ACUTE ONLY): 34 min   Charges:   PT Evaluation $PT Eval Moderate Complexity: 1 Mod PT Treatments $Therapeutic Activity: 8-22 mins        Jaquin Coy,PT Acute Rehabilitation Services Pager:  [phone]  Office:  [phone]    Denice Paradise 04/26/2018, 1:19 PM
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
## 2018-04-26 NOTE — Social Work (Signed)
CSW acknowledging consult for SNF placement. Will follow for therapy recommendations.   Kortez Murtagh, MSW, LCSWA [place] Clinical Social Work [phone]   

## 2018-04-26 NOTE — Discharge Instructions (Signed)
Information on my medicine - ELIQUIS (apixaban)  This medication education was reviewed with me or my healthcare representative as part of my discharge preparation.   Why was Eliquis prescribed for you? Eliquis was prescribed for you to reduce the risk of blood clots forming after orthopedic surgery.    What do You need to know about Eliquis? Take your Eliquis TWICE DAILY - one tablet in the morning and one tablet in the evening with or without food.  It would be best to take the dose about the same time each day.  If you have difficulty swallowing the tablet whole please discuss with your pharmacist how to take the medication safely.  Take Eliquis exactly as prescribed by your doctor and DO NOT stop taking Eliquis without talking to the doctor who prescribed the medication.  Stopping without other medication to take the place of Eliquis may increase your risk of developing a clot.  After discharge, you should have regular check-up appointments with your healthcare provider that is prescribing your Eliquis.  What do you do if you miss a dose? If a dose of ELIQUIS is not taken at the scheduled time, take it as soon as possible on the same day and twice-daily administration should be resumed.  The dose should not be doubled to make up for a missed dose.  Do not take more than one tablet of ELIQUIS at the same time.  Important Safety Information A possible side effect of Eliquis is bleeding. You should call your healthcare provider right away if you experience any of the following: ? Bleeding from an injury or your nose that does not stop. ? Unusual colored urine (red or dark brown) or unusual colored stools (red or black). ? Unusual bruising for unknown reasons. ? A serious fall or if you hit your head (even if there is no bleeding).  Some medicines may interact with Eliquis and might increase your risk of bleeding or clotting while on Eliquis. To help avoid this, consult your  healthcare provider or pharmacist prior to using any new prescription or non-prescription medications, including herbals, vitamins, non-steroidal anti-inflammatory drugs (NSAIDs) and supplements.  This website has more information on Eliquis (apixaban): http://www.eliquis.com/eliquis/home

## 2018-04-26 NOTE — Progress Notes (Addendum)
Patient ID: Katherine [name], female   DOB: August 20, 1955, 63 y.o.   MRN: [id number]     Subjective:  Patient reports pain as mild to moderate.  Patient in bed and in no acute distress.    Objective:   VITALS:   Vitals:   04/25/18 1630 04/25/18 2203 04/25/18 2249 04/26/18 0352  BP: (!) 102/55 (!) 80/67 (!) 103/58 (!) 102/48  Pulse: 77 72 81 79  Resp: 18 20  19   Temp: 98.5 F (36.9 C) 98.5 F (36.9 C)  97.9 F (36.6 C)  TempSrc: Oral Oral  Oral  SpO2: 95% 100%  98%  Weight:      Height:        ABD soft Sensation intact distally Dorsiflexion/Plantar flexion intact Incision: no drainage Dressings changed and wound good.  Lab Results  Component Value Date   WBC 6.9 04/26/2018   HGB 8.7 (L) 04/26/2018   HCT 27.7 (L) 04/26/2018   MCV 85.5 04/26/2018   PLT 97 (L) 04/26/2018   BMET    Component Value Date/Time   NA 134 (L) 04/26/2018 0432   NA 140 05/20/2017 0821   K 4.1 04/26/2018 0432   CL 104 04/26/2018 0432   CO2 24 04/26/2018 0432   GLUCOSE 122 (H) 04/26/2018 0432   BUN 28 (H) 04/26/2018 0432   BUN 21 05/20/2017 0821   CREATININE 0.99 04/26/2018 0432   CALCIUM 8.0 (L) 04/26/2018 0432   GFRNONAA >60 04/26/2018 0432   GFRAA >60 04/26/2018 0432     Assessment/Plan: 2 Days Post-Op   Principal Problem:   Closed comminuted intertrochanteric fracture of proximal femur, left, initial encounter ([place]) Active Problems:   Type II diabetes mellitus with renal manifestations ([facility])   HLD (hyperlipidemia)   Essential hypertension   Depression with anxiety   Hypothyroidism   GERD (gastroesophageal reflux disease)   CKD (chronic kidney disease), stage III ([place])   Fall   Closed comminuted fracture of right humerus   NASH (nonalcoholic steatohepatitis)   Liver cirrhosis ([place])   Advance diet Up with therapy DC foley WBAT left lower ext NWB right upper ext   [name] 04/26/2018, 8:49 AM  Discussed and agree with above.  [name], MD Cell [phone]

## 2018-04-26 NOTE — Clinical Social Work Note (Signed)
Clinical Social Work Assessment  Patient Details  Name: Katherine [name] MRN: [id number] Date of Birth: [birth year]  Date of referral:  04/26/18               Reason for consult:  Facility Placement, Discharge Planning                Permission sought to share information with:  Family Supports, Customer service manager Permission granted to share information::  Yes, Verbal Permission Granted  Name::     Erick Oxendine  Agency::  SNFs  Relationship::  daughter in Financial trader Information:  [phone]  Housing/Transportation Living arrangements for the past 2 months:  [place] of Information:  Patient Patient Interpreter Needed:  None Criminal Activity/Legal Involvement Pertinent to Current Situation/Hospitalization:  No - Comment as needed Significant Relationships:  Adult Children, Other Family Members, Community Support Lives with:  Adult Children, Relatives Do you feel safe going back to the place where you live?  Yes Need for family participation in patient care:  Yes (Comment)  Care giving concerns:  Pt took a fall, has injuries to lower and upper extremities. Pt requires max assist with ADLs and IADLs. Pt lives with her children (son and daughter in Sports coach) and granddaughter. Both work during the day.   Social Worker assessment / plan: CSW met with pt at bedside, introduced self, role, and reason for visit. Pt states that she lives with her son and daughter in law. Her daughter in law works for [employer] at home. She also has a 80 year old granddaughter who she enjoys helping with. Pt was getting items from garage freezer and fell over item in garage. Pt went to CIR in the past and does have some interest in seeing if she can go to CIR instead of SNF. She wants to see if she is eligible before CSW sends referral out but is okay with CSW starting SNF process just in case.   CSW will continue to follow.  Employment status:  Disabled (Comment on whether or not  currently receiving Disability) Insurance information:  Managed Medicare PT Recommendations:  Not assessed at this time Information / Referral to community resources:  [place]  Patient/Family's Response to care:  Pt amenable to speaking with CSW, she is interested in CIR with SNF as a backup in case.  Patient/Family's Understanding of and Emotional Response to Diagnosis, Current Treatment, and Prognosis:  Pt states understanding of her diagnosis, current treatment and prognosis. She is motivated to get stronger and get home. Pt pleasant and emotionally appropriate throughout assessment with CSW.   Emotional Assessment Appearance:  Appears stated age Attitude/Demeanor/Rapport:  Engaged, Charismatic, Gracious Affect (typically observed):  Accepting, Adaptable, Appropriate, Pleasant Orientation:  Oriented to Self, Oriented to Place, Oriented to  Time, Oriented to Situation Alcohol / Substance use:  Not Applicable Psych involvement (Current and /or in the community):  Outpatient Provider  Discharge Needs  Concerns to be addressed:  Care Coordination Readmission within the last 30 days:  No Current discharge risk:  Dependent with Mobility, Physical Impairment Barriers to Discharge:  Ship broker, Continued Medical Work up   [employer], [place] 04/26/2018, 12:58 PM

## 2018-04-26 NOTE — Social Work (Signed)
FL2 completed, PASRR pending. Pt interested in CIR. If not able to d/c to CIR will fax out FL2.  Westley Hummer, MSW, [place] Work [phone]

## 2018-04-27 LAB — TYPE AND SCREEN
ABO/RH(D): AB POS
Antibody Screen: NEGATIVE
Unit division: 0
Unit division: 0
Unit division: 0

## 2018-04-27 LAB — BASIC METABOLIC PANEL
Anion gap: 6 (ref 5–15)
BUN: 22 mg/dL (ref 8–23)
CO2: 27 mmol/L (ref 22–32)
Calcium: 8.4 mg/dL — ABNORMAL LOW (ref 8.9–10.3)
Chloride: 102 mmol/L (ref 98–111)
Creatinine, Ser: 0.92 mg/dL (ref 0.44–1.00)
GFR calc Af Amer: >60 mL/min (ref >60)
GFR calc non Af Amer: >60 mL/min (ref >60)
Glucose, Bld: 106 mg/dL — ABNORMAL HIGH (ref 70–99)
Potassium: 3.8 mmol/L (ref 3.5–5.1)
Sodium: 135 mmol/L (ref 135–145)

## 2018-04-27 LAB — BPAM RBC
BLOOD PRODUCT EXPIRATION DATE: 202003182359
Blood Product Expiration Date: 202003122359
Blood Product Expiration Date: 202003182359
ISSUE DATE / TIME: 202002221006
ISSUE DATE / TIME: 202002222258
ISSUE DATE / TIME: 202002231328
UNIT TYPE AND RH: 8400
Unit Type and Rh: 6200
Unit Type and Rh: 6200

## 2018-04-27 LAB — GLUCOSE, CAPILLARY
Glucose-Capillary: 84 mg/dL (ref 70–99)
Glucose-Capillary: 143 mg/dL — ABNORMAL HIGH (ref 70–99)
Glucose-Capillary: 166 mg/dL — ABNORMAL HIGH (ref 70–99)
Glucose-Capillary: 157 mg/dL — ABNORMAL HIGH (ref 70–99)

## 2018-04-27 LAB — CBC
HCT: 29 % — ABNORMAL LOW (ref 36.0–46.0)
Hemoglobin: 9.1 g/dL — ABNORMAL LOW (ref 12.0–15.0)
MCH: 27.5 pg (ref 26.0–34.0)
MCHC: 31.4 g/dL (ref 30.0–36.0)
MCV: 87.6 fL (ref 80.0–100.0)
Platelets: 133 10*3/uL — ABNORMAL LOW (ref 150–400)
RBC: 3.31 MIL/uL — ABNORMAL LOW (ref 3.87–5.11)
RDW: 15.2 % (ref 11.5–15.5)
WBC: 7.4 10*3/uL (ref 4.0–10.5)
nRBC: 0.7 % — ABNORMAL HIGH (ref 0.0–0.2)

## 2018-04-27 MED ORDER — SENNOSIDES-DOCUSATE SODIUM 8.6-50 MG PO TABS
1.0000 | ORAL_TABLET | Freq: Two times a day (BID) | ORAL | Status: DC
  Administered 2018-04-27 – 2018-04-28 (×3): 1 via ORAL
  Filled 2018-04-27 (×5): qty 1

## 2018-04-27 MED ORDER — DIPHENHYDRAMINE-ZINC ACETATE 2-0.1 % EX CREA
TOPICAL_CREAM | Freq: Two times a day (BID) | CUTANEOUS | Status: DC | PRN
  Filled 2018-04-27: qty 28

## 2018-04-27 NOTE — Care Management Important Message (Deleted)
Important Message  Patient Details  Name: Katherine [name] MRN: [id number] Date of Birth: 01-Jan-1956   Medicare Important Message Given:  Yes  Patient is at end of life and out of respect for the family no IM was given Orbie Pyo 04/27/2018, 3:58 PM

## 2018-04-27 NOTE — Progress Notes (Signed)
Inpatient Rehabilitation Admissions Coordinator  I met with patient at bedside. She states she and her daughter in law, [place], spoke last night and they prefer SNF rehab at this time. I left a voicemail for [name] to contact me to confirm, I have updated RN CM and SW. We will signoff at this time.  Danne Baxter, RN, MSN Rehab Admissions Coordinator [phone] 04/27/2018 12:16 PM

## 2018-04-27 NOTE — Progress Notes (Signed)
Physical Therapy Treatment Patient Details Name: Katherine [name] MRN: [id number] DOB: 1956/01/13 Today's Date: 04/27/2018    History of Present Illness This is a 63 year old morbidly obese female with history of diabetes, hypertension, and dyslipidemia, subdural hematoma, left foot drop, gait disorder, Nash/cirrhosis sleep apnea, chronic kidney disease stage III presented following a mechanical fall to the ED with left thigh and right shoulder pain, the emergency room patient was noted to have a left intertrochanteric femur fracture and right comminuted proximal humerus fracture.IM nail left femur 2/21 and ORIF right proximal humerus fx on 2/22.    PT Comments    Continuing work on functional mobility and activity tolerance;  Able to perform sit<>stand and stand pivot transfer bed to chair (placed on pt's R side) with +2 Mod/Max assist; Very anxious at anticipation of pain, but participating well; noted at this time pt is wanting to pursue SNF for post-acute rehab; Will update dc plan  Follow Up Recommendations  SNF;Supervision/Assistance - 24 hour     Equipment Recommendations  3in1 (PT);Wheelchair (measurements PT);Wheelchair cushion (measurements PT)    Recommendations for Other Services       Precautions / Restrictions Precautions Precautions: Fall;Shoulder Type of Shoulder Precautions: elbow, wrist, hand exercies OK; NO ROM of shoulder Shoulder Interventions: Shoulder sling/immobilizer;Off for dressing/bathing/exercises Precaution Booklet Issued: No Required Braces or Orthoses: Sling Restrictions Weight Bearing Restrictions: Yes RUE Weight Bearing: Non weight bearing LLE Weight Bearing: Weight bearing as tolerated    Mobility  Bed Mobility Overal bed mobility: Needs Assistance Bed Mobility: Supine to Sit Rolling: Max assist;+2 for physical assistance         General bed mobility comments: Cues to use RLE to help move body to get up on L side of the bed; Max assist of  2 to support and elevate trunk and support LLE coming to the floor   Transfers Overall transfer level: Needs assistance Equipment used: 1 person hand held assist(with second person on right side providing support with gait belt ) Transfers: Sit to/from [employer] Transfers Sit to Stand: Mod assist;+2 physical assistance;From elevated surface Stand pivot transfers: Max assist;+2 safety/equipment       General transfer comment: pt able to pivot on RLE a little and then did take one small step with RLE at which point we had to pivot her hips towards chair to sit.  Ambulation/Gait                 Stairs             Wheelchair Mobility    Modified Rankin (Stroke Patients Only)       Balance Overall balance assessment: Needs assistance Sitting-balance support: Feet supported;No upper extremity supported Sitting balance-Leahy Scale: Fair     Standing balance support: Single extremity supported(with second person on right side providing support with gait belt) Standing balance-Leahy Scale: Zero                              Cognition Arousal/Alertness: Awake/alert Behavior During Therapy: Anxious Overall Cognitive Status: Within Functional Limits for tasks assessed                                        Exercises General Exercises - Lower Extremity Ankle Circles/Pumps: AAROM;Both;Supine;10 reps Quad Sets: AROM;Left;10 reps Heel Slides: AAROM;Left;10 reps(Moving very slowly) Hip ABduction/ADduction: AAROM;Left;10 reps  Other Exercises Other Exercises: Pt performed 2 reps of elbow AROM and 5 reps each of forearm, wrist, and hand at end of session with arm in sling. Educated her on doing these last 3 when commericals came on.    General Comments        Pertinent Vitals/Pain Pain Assessment: Faces Faces Pain Scale: Hurts even more Pain Location: left LE Pain Descriptors / Indicators: Grimacing;Guarding;Moaning Pain  Intervention(s): Monitored during session;Premedicated before session;Repositioned    Home Living Family/patient expects to be discharged to:: Inpatient rehab Living Arrangements: Children Available Help at Discharge: Family;Available 24 hours/day Type of Home: House Home Access: Stairs to enter Entrance Stairs-Rails: None Home Layout: Two level;Able to live on main level with bedroom/bathroom Home Equipment: Gilford Rile - 2 wheels;Cane - single point;Toilet riser;Bedside commode;Tub bench;Shower seat;Walker - 4 wheels      Prior Function Level of Independence: Independent with assistive device(s);Needs assistance  Gait / Transfers Assistance Needed: states she used strt cane PTA ADL's / Homemaking Assistance Needed: States she stepped into tub to bathe Comments: states she has had falls.    PT Goals (current goals can now be found in the care plan section) Acute Rehab PT Goals Patient Stated Goal: to go to rehab then home PT Goal Formulation: With patient Time For Goal Achievement: 05/10/18 Potential to Achieve Goals: Good Progress towards PT goals: Progressing toward goals    Frequency    Min 2X/week(per departmental protocol)      PT Plan Discharge plan needs to be updated;Frequency needs to be updated    Co-evaluation PT/OT/SLP Co-Evaluation/Treatment: Yes Reason for Co-Treatment: Complexity of the patient's impairments (multi-system involvement);For patient/therapist safety;To address functional/ADL transfers PT goals addressed during session: Mobility/safety with mobility OT goals addressed during session: ADL's and self-care;Strengthening/ROM      AM-PAC PT "6 Clicks" Mobility   Outcome Measure  Help needed turning from your back to your side while in a flat bed without using bedrails?: A Lot Help needed moving from lying on your back to sitting on the side of a flat bed without using bedrails?: A Lot Help needed moving to and from a bed to a chair (including a  wheelchair)?: A Lot Help needed standing up from a chair using your arms (e.g., wheelchair or bedside chair)?: A Lot Help needed to walk in hospital room?: Total Help needed climbing 3-5 steps with a railing? : Total 6 Click Score: 10    End of Session Equipment Utilized During Treatment: Gait belt(sling) Activity Tolerance: Patient tolerated treatment well Patient left: in chair;with call bell/phone within reach Nurse Communication: Mobility status;Need for lift equipment(Notified mobiltiy tech of need to use [employer] for back to b) PT Visit Diagnosis: Unsteadiness on feet (R26.81);Muscle weakness (generalized) (M62.81);Pain Pain - part of body: Arm;Leg(right UE and left LE, back)     Time: 2500-3704 PT Time Calculation (min) (ACUTE ONLY): 39 min  Charges:  $Therapeutic Exercise: 8-22 mins $Therapeutic Activity: 8-22 mins                     Roney Marion, PT  Acute Rehabilitation Services Pager [phone] Office [place] 04/27/2018, 1:26 PM

## 2018-04-27 NOTE — Op Note (Signed)
DATE OF SURGERY:  04/27/2018  TIME: 7:25 AM  PATIENT NAME:  Katherine [name]  AGE: 63 y.o.  PRE-OPERATIVE DIAGNOSIS:  Right humerus fracture  POST-OPERATIVE DIAGNOSIS:  SAME  PROCEDURE:  OPEN REDUCTION INTERNAL FIXATION (ORIF) PROXIMAL HUMERUS FRACTURE  SURGEON:  Renette Butters  ASSISTANT:  Roxan Hockey, PA-C, he was present and scrubbed throughout the case, critical for completion in a timely fashion, and for retraction, instrumentation, and closure.   OPERATIVE IMPLANTS: [employer] Gamma Nail  PREOPERATIVE INDICATIONS:  Katherine [name] is a 63 y.o. year old who fell and suffered a hip fracture. She was brought into the ER and then admitted and optimized and then elected for surgical intervention.    The risks benefits and alternatives were discussed with the patient including but not limited to the risks of nonoperative treatment, versus surgical intervention including infection, bleeding, nerve injury, malunion, nonunion, hardware prominence, hardware failure, need for hardware removal, blood clots, cardiopulmonary complications, morbidity, mortality, among others, and they were willing to proceed.    OPERATIVE PROCEDURE:  The patient was brought to the operating room and placed in the supine position. General anesthesia was administered. She was placed on the fracture table.  Closed reduction was performed under C-arm guidance. Time out was then performed after sterile prep and drape. She received preoperative antibiotics.  Incision was made proximal to the greater trochanter. A guidewire was placed in the appropriate position. Confirmation was made on AP and lateral views. The above-named nail was opened. I opened the proximal femur with a reamer.   I made a lateral incsion at her comminuted fracture site. I dissected through it band sharply. I was able to perform a reduction of her fracture and placed the guide wire.   I then placed the nail by hand easily down. I did not  need to ream the femur.  Once the nail was completely seated, I placed a guidepin into the femoral head into the center center position. I measured the length, and then reamed the lateral cortex and up into the head. I then placed the lag screw. Slight compression was applied. Anatomic fixation achieved. Bone quality was mediocre.  I then secured the proximal interlocking bolt, and took off a half a turn, and then removed the instruments, and took final C-arm pictures AP and lateral the entire length of the leg.  I then used perfect circles technique to place a distal interlock screw.   Anatomic reconstruction was achieved, and the wounds were irrigated copiously and closed with Vicryl followed by staples and sterile gauze for the skin. The patient was awakened and returned to PACU in stable and satisfactory condition. There no complications and the patient tolerated the procedure well.  She will be weightbearing as tolerated, and will be on chemical px  for a period of four weeks after discharge.   Edmonia Lynch, M.D.

## 2018-04-27 NOTE — Progress Notes (Addendum)
Patient ID: Katherine [name], female   DOB: 04/11/1955, 63 y.o.   MRN: [id number]     Subjective:  Patient reports pain as mild to moderate.  Patient in bed and about to work with PT She denies any CP or SOB  Objective:   VITALS:   Vitals:   04/26/18 0352 04/26/18 1500 04/26/18 2023 04/27/18 0530  BP: (!) 102/48 (!) 90/47 (!) 96/51 (!) 98/59  Pulse: 79 78 91 88  Resp: 19 18 19 17   Temp: 97.9 F (36.6 C) 98.3 F (36.8 C) 98.5 F (36.9 C) 98.7 F (37.1 C)  TempSrc: Oral Oral Oral Oral  SpO2: 98% 97% 94% 94%  Weight:      Height:        ABD soft Sensation intact distally Dorsiflexion/Plantar flexion intact Incision: dressing C/D/I and no drainage  Lab Results  Component Value Date   WBC 7.4 04/27/2018   HGB 9.1 (L) 04/27/2018   HCT 29.0 (L) 04/27/2018   MCV 87.6 04/27/2018   PLT 133 (L) 04/27/2018   BMET    Component Value Date/Time   NA 135 04/27/2018 0827   NA 140 05/20/2017 0821   K 3.8 04/27/2018 0827   CL 102 04/27/2018 0827   CO2 27 04/27/2018 0827   GLUCOSE 106 (H) 04/27/2018 0827   BUN 22 04/27/2018 0827   BUN 21 05/20/2017 0821   CREATININE 0.92 04/27/2018 0827   CALCIUM 8.4 (L) 04/27/2018 0827   GFRNONAA >60 04/27/2018 0827   GFRAA >60 04/27/2018 0827     Assessment/Plan: 3 Days Post-Op   Principal Problem:   Closed comminuted intertrochanteric fracture of proximal femur, left, initial encounter ([place]) Active Problems:   Type II diabetes mellitus with renal manifestations (H[facility])   HLD (hyperlipidemia)   Essential hypertension   Depression with anxiety   Hypothyroidism   GERD (gastroesophageal reflux disease)   CKD (chronic kidney disease), stage III ([place])   Fall   Closed comminuted fracture of right humerus   NASH (nonalcoholic steatohepatitis)   Liver cirrhosis ([place])   Advance diet Up with therapy Continue plan per medicine WBAT left lower ext NWB right upper ext Sling at all times except with PT/OT Plan for CIR   [name] 04/27/2018, 11:06 AM  Discussed and agree with above.   [name], MD Cell [phone]

## 2018-04-27 NOTE — Progress Notes (Signed)
PROGRESS NOTE    Katherine [name]  WUJ:[id number] DOB: 1955-09-15 DOA: 04/22/2018 PCP: Mayra Neer, MD  Brief Narrative: This is a 63 year old morbidly obese female with history of diabetes, hypertension, and dyslipidemia, subdural hematoma, left foot drop, gait disorder, Nash/cirrhosis sleep apnea, chronic kidney disease stage III presented following a mechanical fall to the ED with left thigh and right shoulder pain, the emergency room patient was noted to have a left intertrochanteric femur fracture and right comminuted proximal humerus fracture. -Subsequently underwent surgical repair of left femur followed by ORIF of right humerus the following day -Postop course notable for blood loss anemia and thrombocytopenia from consumption, status post transfusion -Now improving, CIR evaluating  Assessment & Plan:     Closed comminuted intertrochanteric fracture of proximal femur, left, and comminuted fracture of right humerus -Ortho consulting, underwent staged surgical intervention with the left femur repaired 2/21, and then underwent ORIF of R prox humerus 2/22 -high risk of VTE, started low-dose Eliquis twice daily for DVT prophylaxis, continue this for 4 weeks -caution with oversedation -Ambulate, increase activity, PT OT following -CIR evaluating as well, discharge planning  Acute Blood loss anemia -From Intra-Op blood loss, hemodilution and had postop bleeding from surgical site -No further bleeding, status post 2 units of PRBC -Hemoglobin improved and stable at this time  Thrombocytopenia -acute on chronic, at basline slightly low due to Cirrhosis -Worsened acutely postop and in the setting of bleeding and platelet consumption  -Now improving, monitor  Type 2 diabetes mellitus -Poorly controlled, on Victoza and [name] at baseline -now stable, continue 60 units daily, sliding scale insulin, monitor  NASH/cirrhosis -Stable, at risk of fluid overload -Not on diuretics at  baseline -albumin is 2.9 and INR is 1.18  Hypertension -Continue Bystolic, hold lisinopril  Morbid obesity/OSA -Does not use CPAP bedtime, lifestyle modification needed  Chronic kidney disease stage III -Stable, monitor  Depression/anxiety -Continue Klonopin and Cymbalta  Gait disorder left foot drop -PT, will need rehab  DVT ppx: Started on low-dose Eliquis for DVT prophylaxis Code Status: Full code Family Communication: No family at bedside Disposition Plan:   CIR versus SNF in 1-2days Consults : Dr. Levy Sjogren    Procedures: L femur repair 1/21  R humerus: orif 2/22  Antimicrobials:    Subjective: -Feels a little better today, tried to get up with PT yesterday -Complains of itching, no BMs yet  Objective: Vitals:   04/26/18 0352 04/26/18 1500 04/26/18 2023 04/27/18 0530  BP: (!) 102/48 (!) 90/47 (!) 96/51 (!) 98/59  Pulse: 79 78 91 88  Resp: 19 18 19 17   Temp: 97.9 F (36.6 C) 98.3 F (36.8 C) 98.5 F (36.9 C) 98.7 F (37.1 C)  TempSrc: Oral Oral Oral Oral  SpO2: 98% 97% 94% 94%  Weight:      Height:        Intake/Output Summary (Last 24 hours) at 04/27/2018 1321 Last data filed at 04/27/2018 1005 Gross per 24 hour  Intake 840 ml  Output 1200 ml  Net -360 ml   Filed Weights   04/22/18 2008 04/23/18 0022 04/23/18 1223  Weight: 125.2 kg 130.4 kg 129.7 kg    Examination:  Gen: Awake, Alert, Oriented X 3, morbidly obese female, no distress HEENT: PERRLA, Neck supple, no JVD Lungs: Decreased breath sounds at both bases, rest is clear CVS: RRR,No Gallops,Rubs or new Murmurs Abd: soft, Non tender, non distended, BS present Extremities: Left hip with dressing, right shoulder with dressing Skin: no new rashes Psychiatry: Judgement  and insight appear normal. Mood & affect appropriate.     Data Reviewed:   CBC: Recent Labs  Lab 04/22/18 2013  04/24/18 1620 04/25/18 0245 04/25/18 1109 04/26/18 0707 04/27/18 0827  WBC 7.2   < > 12.4*  8.5 8.4 6.9 7.4  NEUTROABS 5.0  --   --   --   --   --   --   HGB 12.5   < > 7.4* 7.7* 7.7* 8.7* 9.1*  HCT 40.9   < > 22.6* 23.9* 24.1* 27.7* 29.0*  MCV 84.7   < > 85.0 84.5 84.3 85.5 87.6  PLT PLATELET CLUMPS NOTED ON SMEAR, COUNT APPEARS DECREASED   < > PLATELET CLUMPS NOTED ON SMEAR, COUNT APPEARS DECREASED 76* 85* 97* 133*   < > = values in this interval not displayed.   Basic Metabolic Panel: Recent Labs  Lab 04/24/18 0114 04/25/18 0245 04/25/18 0743 04/26/18 0432 04/27/18 0827  NA 135 135 135 134* 135  K 4.3 4.0 4.0 4.1 3.8  CL 101 103 102 104 102  CO2 24 22 27 24 27   GLUCOSE 231* 129* 126* 122* 106*  BUN 33* 34* 31* 28* 22  CREATININE 1.34* 1.19* 1.13* 0.99 0.92  CALCIUM 8.2* 7.8* 8.1* 8.0* 8.4*   GFR: Estimated Creatinine Clearance: 87.6 mL/min (by C-G formula based on SCr of 0.92 mg/dL). Liver Function Tests: Recent Labs  Lab 04/23/18 0552 04/24/18 0114 04/25/18 0245 04/26/18 0432  AST 47* 57* 53* 55*  ALT 27 26 19 18   ALKPHOS 66 51 40 52  BILITOT 0.8 0.8 0.9 1.0  PROT 7.0 6.3* 5.5* 5.6*  ALBUMIN 2.9* 2.9* 2.7* 2.7*   No results for input(s): LIPASE, AMYLASE in the last 168 hours. Recent Labs  Lab 04/23/18 0552  AMMONIA 28   Coagulation Profile: Recent Labs  Lab 04/23/18 0552  INR 1.18   Cardiac Enzymes: No results for input(s): CKTOTAL, CKMB, CKMBINDEX, TROPONINI in the last 168 hours. BNP (last 3 results) No results for input(s): PROBNP in the last 8760 hours. HbA1C: No results for input(s): HGBA1C in the last 72 hours. CBG: Recent Labs  Lab 04/26/18 0758 04/26/18 1224 04/26/18 1715 04/26/18 2027 04/27/18 0822  GLUCAP 115* 113* 191* 125* 84   Lipid Profile: No results for input(s): CHOL, HDL, LDLCALC, TRIG, CHOLHDL, LDLDIRECT in the last 72 hours. Thyroid Function Tests: No results for input(s): TSH, T4TOTAL, FREET4, T3FREE, THYROIDAB in the last 72 hours. Anemia Panel: No results for input(s): VITAMINB12, FOLATE, FERRITIN, TIBC,  IRON, RETICCTPCT in the last 72 hours. Urine analysis:    Component Value Date/Time   COLORURINE YELLOW 09/30/2017 1818   APPEARANCEUR CLEAR 09/30/2017 1818   LABSPEC 1.019 09/30/2017 1818   PHURINE 5.0 09/30/2017 1818   GLUCOSEU 50 (A) 09/30/2017 1818   HGBUR NEGATIVE 09/30/2017 1818   BILIRUBINUR NEGATIVE 09/30/2017 1818   KETONESUR NEGATIVE 09/30/2017 1818   PROTEINUR NEGATIVE 09/30/2017 1818   UROBILINOGEN 1.0 11/21/2014 1955   NITRITE NEGATIVE 09/30/2017 1818   LEUKOCYTESUR NEGATIVE 09/30/2017 1818   Sepsis Labs: @LABRCNTIP (procalcitonin:4,lacticidven:4)  ) Recent Results (from the past 240 hour(s))  Surgical pcr screen     Status: None   Collection Time: 04/23/18  2:21 AM  Result Value Ref Range Status   MRSA, PCR NEGATIVE NEGATIVE Final   Staphylococcus aureus NEGATIVE NEGATIVE Final    Comment: (NOTE) The Xpert SA Assay (FDA approved for NASAL specimens in patients 40 years of age and older), is one component of a comprehensive surveillance program.  It is not intended to diagnose infection nor to guide or monitor treatment. Performed at [facility], [place] [address] [place], [place] [address]          Radiology Studies: Dg Ankle 2 Views Left  Result Date: 04/26/2018 CLINICAL DATA:  Left ankle pain since a fall 04/22/2018. History of remote trauma also. EXAM: LEFT ANKLE - 2 VIEW COMPARISON:  Radiographs 01/23/2012. FINDINGS: There is a remote healed distal fibular shaft fracture. The ankle mortise is maintained. No acute ankle fracture is identified. The mid and hindfoot bony structures are intact. Mild degenerative changes. Moderate size calcaneal heel spur. IMPRESSION: Remote healed distal fibular shaft fracture. No acute ankle fracture. Electronically Signed   By: Marijo Sanes M.D.   On: 04/26/2018 10:36        Scheduled Meds: . apixaban  2.5 mg Oral BID  . docusate sodium  100 mg Oral BID  . DULoxetine  60 mg Oral QHS  . feeding supplement  (GLUCERNA SHAKE)  237 mL Oral BID BM  . gabapentin  300 mg Oral QHS  . insulin aspart  0-9 Units Subcutaneous TID WC  . insulin glargine  60 Units Subcutaneous QHS  . levothyroxine  25 mcg Oral Q0600  . multivitamin with minerals  1 tablet Oral Daily  . nebivolol  10 mg Oral Daily  . pantoprazole  40 mg Oral BID  . polyethylene glycol  17 g Oral Daily  . rosuvastatin  40 mg Oral q1800   Continuous Infusions:    LOS: 5 days    Time spent: 61mn    PDomenic Polite MD [facility]  04/27/2018, 1:21 PM

## 2018-04-27 NOTE — Anesthesia Postprocedure Evaluation (Signed)
Anesthesia Post Note  Patient: Katherine [name]  Procedure(s) Performed: OPEN REDUCTION INTERNAL FIXATION (ORIF) PROXIMAL HUMERUS FRACTURE (Right Arm Upper)     Patient location during evaluation: PACU Anesthesia Type: Regional and General Level of consciousness: awake and patient cooperative Pain management: pain level controlled Vital Signs Assessment: post-procedure vital signs reviewed and stable Respiratory status: spontaneous breathing, nonlabored ventilation, respiratory function stable and patient connected to nasal cannula oxygen Cardiovascular status: blood pressure returned to baseline and stable Postop Assessment: no apparent nausea or vomiting Anesthetic complications: no    Last Vitals:  Vitals:   04/27/18 1536 04/27/18 2118  BP: (!) 107/50 (!) 110/43  Pulse: 82 84  Resp: 18 16  Temp: 36.9 C 36.7 C  SpO2: 95% 96%    Last Pain:  Vitals:   04/27/18 2222  TempSrc:   PainSc: 6                  Eulan Heyward

## 2018-04-27 NOTE — Anesthesia Postprocedure Evaluation (Signed)
Anesthesia Post Note  Patient: Katherine [name]  Procedure(s) Performed: INTRAMEDULLARY (IM) NAIL FEMORAL (Left )     Patient location during evaluation: PACU Anesthesia Type: General Level of consciousness: awake and alert Pain management: pain level controlled Vital Signs Assessment: post-procedure vital signs reviewed and stable Respiratory status: spontaneous breathing, nonlabored ventilation, respiratory function stable and patient connected to nasal cannula oxygen Cardiovascular status: blood pressure returned to baseline and stable Postop Assessment: no apparent nausea or vomiting Anesthetic complications: no    Last Vitals:  Vitals:   04/27/18 1536 04/27/18 2118  BP: (!) 107/50 (!) 110/43  Pulse: 82 84  Resp: 18 16  Temp: 36.9 C 36.7 C  SpO2: 95% 96%    Last Pain:  Vitals:   04/27/18 2222  TempSrc:   PainSc: 6                  Karolyne Timmons

## 2018-04-27 NOTE — Evaluation (Signed)
Occupational Therapy Evaluation Patient Details Name: Katherine [name] MRN: [id number] DOB: 27-Feb-1956 Today's Date: 04/27/2018    History of Present Illness This is a 63 year old morbidly obese female with history of diabetes, hypertension, and dyslipidemia, subdural hematoma, left foot drop, gait disorder, Nash/cirrhosis sleep apnea, chronic kidney disease stage III presented following a mechanical fall to the ED with left thigh and right shoulder pain, the emergency room patient was noted to have a left intertrochanteric femur fracture and right comminuted proximal humerus fracture.IM nail left femur 2/21 and ORIF right proximal humerus fx on 2/22.   Clinical Impression   This 63 yo female admitted and underwent above presents to acute OT with increased pain, decreased use of RUE, decreased movement of LLE, decreased mobility all affecting her PLOF of being independent to Mod I with all basic ADLs. She will benefit from acute OT with follow up OT on CIR to get to a min guard A or better for related ADL transfers as well as increasing balance for basic ADLs and independence with basic ADLs.    Follow Up Recommendations  CIR;Supervision/Assistance - 24 hour    Equipment Recommendations  Other (comment)(bariatric 3n1)       Precautions / Restrictions Precautions Precautions: Fall;Shoulder Type of Shoulder Precautions: elbow, wrist, hand exercies OK; NO ROM of shoulder Shoulder Interventions: Shoulder sling/immobilizer;Off for dressing/bathing/exercises Precaution Booklet Issued: No Required Braces or Orthoses: Sling Restrictions Weight Bearing Restrictions: Yes RUE Weight Bearing: Non weight bearing LLE Weight Bearing: Weight bearing as tolerated      Mobility Bed Mobility Overal bed mobility: Needs Assistance Bed Mobility: Supine to Sit Rolling: Max assist;+2 for physical assistance            Transfers Overall transfer level: Needs assistance Equipment used: 1 person  hand held assist(with second person on right side providing support with gait belt ) Transfers: Sit to/from [facility] Sit to Stand: Mod assist;+2 physical assistance;From elevated surface Stand pivot transfers: Max assist;+2 safety/equipment       General transfer comment: pt able to pivot on RLE a little and then did take one small step with RLE at which point we had to pivot her hips towards chair to sit.    Balance Overall balance assessment: Needs assistance Sitting-balance support: Feet supported;No upper extremity supported Sitting balance-Leahy Scale: Fair     Standing balance support: Single extremity supported(with second person on right side providing support with gait belt) Standing balance-Leahy Scale: Zero                             ADL either performed or assessed with clinical judgement   ADL Overall ADL's : Needs assistance/impaired Eating/Feeding: Set up;Sitting Eating/Feeding Details (indicate cue type and reason): in recliner Grooming: Moderate assistance;Sitting Grooming Details (indicate cue type and reason): in recliner Upper Body Bathing: Maximal assistance;Sitting Upper Body Bathing Details (indicate cue type and reason): in recliner Lower Body Bathing: Total assistance Lower Body Bathing Details (indicate cue type and reason): Mod A +2 sit<>stand Upper Body Dressing : Maximal assistance;Sitting Upper Body Dressing Details (indicate cue type and reason): in recliner Lower Body Dressing: Total assistance Lower Body Dressing Details (indicate cue type and reason): Mod A +2 sit<>stand Toilet Transfer: Maximal assistance;+2 for physical assistance;Stand-pivot Toilet Transfer Details (indicate cue type and reason): bed>recliner directly on pt's right side using gait belt, bed pad and LUE support Toileting- Clothing Manipulation and Hygiene: Total assistance Toileting - Clothing Manipulation  Details (indicate cue type and reason):  Mod A +2 sit<>stand             Vision Patient Visual Report: No change from baseline              Pertinent Vitals/Pain Pain Assessment: Faces Faces Pain Scale: Hurts even more Pain Location: left LE Pain Descriptors / Indicators: Grimacing;Guarding;Moaning Pain Intervention(s): Limited activity within patient's tolerance;Monitored during session;Repositioned     Hand Dominance Right   Extremity/Trunk Assessment Upper Extremity Assessment Upper Extremity Assessment: RUE deficits/detail RUE Deficits / Details: proximal humerus sx 04/24/2018. slight edema. close to full elbow extension and can do AAROM for elbow flexion to a little more than 90 degrees. forearm, wrist, and hand are stiff but she is able to move them           Communication Communication Communication: No difficulties   Cognition Arousal/Alertness: Awake/alert Behavior During Therapy: Anxious Overall Cognitive Status: Within Functional Limits for tasks assessed                                        Exercises Other Exercises Other Exercises: Pt performed 2 reps of elbow AROM and 5 reps each of forearm, wrist, and hand at end of session with arm in sling. Educated her on doing these last 3 when commericals came on.        Home Living Family/patient expects to be discharged to:: Inpatient rehab Living Arrangements: Children Available Help at Discharge: Family;Available 24 hours/day Type of Home: House Home Access: Stairs to enter [employer] of Steps: 4 Entrance Stairs-Rails: None Home Layout: Two level;Able to live on main level with bedroom/bathroom     Bathroom Shower/Tub: Teacher, early years/pre: Standard Bathroom Accessibility: Yes   Home Equipment: Walker - 2 wheels;Cane - single point;Toilet riser;Bedside commode;Tub bench;Shower seat;Walker - 4 wheels          Prior Functioning/Environment Level of Independence: Independent with assistive  device(s);Needs assistance  Gait / Transfers Assistance Needed: states she used strt cane PTA ADL's / Homemaking Assistance Needed: States she stepped into tub to bathe   Comments: states she has had falls.         OT Problem List: Decreased strength;Decreased range of motion;Impaired balance (sitting and/or standing);Pain;Impaired UE functional use;Decreased knowledge of precautions;Decreased coordination;Obesity;Increased edema;Decreased knowledge of use of DME or AE      OT Treatment/Interventions: Self-care/ADL training;Balance training;Therapeutic exercise;DME and/or AE instruction;Patient/family education    OT Goals(Current goals can be found in the care plan section) Acute Rehab OT Goals Patient Stated Goal: to go to rehab then home OT Goal Formulation: With patient Time For Goal Achievement: 05/11/18 Potential to Achieve Goals: Good  OT Frequency: Min 3X/week           Co-evaluation PT/OT/SLP Co-Evaluation/Treatment: Yes Reason for Co-Treatment: Complexity of the patient's impairments (multi-system involvement);For patient/therapist safety;To address functional/ADL transfers PT goals addressed during session: Mobility/safety with mobility;Balance;Strengthening/ROM OT goals addressed during session: ADL's and self-care;Strengthening/ROM      AM-PAC OT "6 Clicks" Daily Activity     Outcome Measure Help from another person eating meals?: A Little Help from another person taking care of personal grooming?: A Lot Help from another person toileting, which includes using toliet, bedpan, or urinal?: Total Help from another person bathing (including washing, rinsing, drying)?: A Lot Help from another person to put on and taking off regular  upper body clothing?: A Lot Help from another person to put on and taking off regular lower body clothing?: Total 6 Click Score: 11   End of Session Equipment Utilized During Treatment: Gait belt;Other (comment)(RUE sling) Nurse  Communication: Mobility status(NT and mobility tech, use of maxi move with lifting her from the side not the front)  Activity Tolerance: Patient tolerated treatment well Patient left: in chair;with call bell/phone within reach  OT Visit Diagnosis: Unsteadiness on feet (R26.81);Other abnormalities of gait and mobility (R26.89);Muscle weakness (generalized) (M62.81);History of falling (Z91.81);Pain Pain - Right/Left: Left Pain - part of body: Leg                Time: 1100-1135 OT Time Calculation (min): 35 min Charges:  OT General Charges $OT Visit: 1 Visit OT Evaluation $OT Eval Moderate Complexity: [place], OTR/L Acute [employer] Pager [phone] Office [phone]     De[place], [place] 04/27/2018, 12:00 PM

## 2018-04-27 NOTE — Care Management Important Message (Signed)
Important Message  Patient Details  Name: Katherine [name] MRN: [id number] Date of Birth: 11/09/1955   Medicare Important Message Given:  Yes    Orbie Pyo 04/27/2018, 4:01 PM

## 2018-04-27 NOTE — Social Work (Signed)
Acknowledging preference for SNF placement. Will fax out pt, she gave permission yesterday for this.  Westley Hummer, MSW, [place] [employer] [phone]

## 2018-04-28 LAB — GLUCOSE, CAPILLARY
Glucose-Capillary: 74 mg/dL (ref 70–99)
Glucose-Capillary: 169 mg/dL — ABNORMAL HIGH (ref 70–99)
Glucose-Capillary: 164 mg/dL — ABNORMAL HIGH (ref 70–99)
Glucose-Capillary: 162 mg/dL — ABNORMAL HIGH (ref 70–99)

## 2018-04-28 LAB — CBC
HCT: 28.3 % — ABNORMAL LOW (ref 36.0–46.0)
Hemoglobin: 9 g/dL — ABNORMAL LOW (ref 12.0–15.0)
MCH: 27.5 pg (ref 26.0–34.0)
MCHC: 31.8 g/dL (ref 30.0–36.0)
MCV: 86.5 fL (ref 80.0–100.0)
Platelets: 148 10*3/uL — ABNORMAL LOW (ref 150–400)
RBC: 3.27 MIL/uL — ABNORMAL LOW (ref 3.87–5.11)
RDW: 15.6 % — ABNORMAL HIGH (ref 11.5–15.5)
WBC: 6.8 10*3/uL (ref 4.0–10.5)
nRBC: 0.6 % — ABNORMAL HIGH (ref 0.0–0.2)

## 2018-04-28 MED ORDER — APIXABAN 2.5 MG PO TABS: 2.5000 mg | ORAL_TABLET | Freq: Two times a day (BID) | ORAL | 0 refills | Status: DC

## 2018-04-28 MED ORDER — GABAPENTIN 300 MG PO CAPS: 300.0000 mg | ORAL_CAPSULE | Freq: Every day | ORAL | Status: DC

## 2018-04-28 MED ORDER — TOUJEO SOLOSTAR 300 UNIT/ML ~~LOC~~ SOPN: 70.0000 [IU] | PEN_INJECTOR | Freq: Every day | SUBCUTANEOUS | Status: DC

## 2018-04-28 MED ORDER — SENNOSIDES-DOCUSATE SODIUM 8.6-50 MG PO TABS: 1.0000 | ORAL_TABLET | Freq: Two times a day (BID) | ORAL | Status: DC

## 2018-04-28 MED ORDER — OXYCODONE HCL 10 MG PO TABS: 10.0000 mg | ORAL_TABLET | Freq: Four times a day (QID) | ORAL | 0 refills | Status: DC | PRN

## 2018-04-28 MED ORDER — POLYETHYLENE GLYCOL 3350 17 G PO PACK: 17.0000 g | PACK | Freq: Every day | ORAL | 0 refills | Status: DC

## 2018-04-28 NOTE — Discharge Summary (Addendum)
Physician Discharge Summary  Katherine [name]:[id number] DOB: 1956-01-14 DOA: 04/22/2018  PCP: Mayra Neer, MD  Admit date: 04/22/2018 Discharge date: 04/28/2018  Time spent: 45 minutes  Recommendations for Outpatient Follow-up:  1.  Instructions per orthopedics: WBAT left lower ext, NWB right upper ext, Sling at all times except with PT/OT 2.  Please check bmet in 3 days 3.  Continue low-dose Eliquis for DVT prophylaxis for 30 days 4.  Orthopedics Dr. Mardelle Matte in 10 days  Discharge Diagnoses:  Principal Problem:   Closed comminuted intertrochanteric fracture of proximal femur, left, initial encounter ([place]) Postop acute blood loss anemia Thrombocytopenia   Type II diabetes mellitus with renal manifestations (HCC)   HLD (hyperlipidemia)   Essential hypertension   Depression with anxiety   Hypothyroidism   GERD (gastroesophageal reflux disease)   CKD (chronic kidney disease), stage III ([place])   Fall   Closed comminuted fracture of right humerus   NASH (nonalcoholic steatohepatitis)   Liver cirrhosis ([place])   Discharge Condition: Stable  Diet recommendation: Low-sodium, diabetic, heart healthy  Filed Weights   04/22/18 2008 04/23/18 0022 04/23/18 1223  Weight: 125.2 kg 130.4 kg 129.7 kg    History of present illness:   63 year old morbidly obese female with history of diabetes, hypertension, and dyslipidemia, subdural hematoma, left foot drop, gait disorder, Nash/cirrhosis sleep apnea, chronic kidney disease stage III presented following a mechanical fall to the ED with left thigh and right shoulder pain, the emergency room patient was noted to have a left intertrochanteric femur fracture and right comminuted proximal humerus fracture.  Hospital Course:   Closed comminuted intertrochanteric fracture of proximal femur, left, and comminuted fracture of right humerus -Ortho consulting, underwent staged surgical intervention with the left femur repaired 2/21 with  intramedullary nail, and then underwent ORIF of R prox humerus 2/22 -high risk of VTE, started low-dose Eliquis twice daily for DVT prophylaxis, continue this for 4 weeks -caution with oversedation -Postop course was complicated by bleeding blood loss anemia, now improved and stable  -PT OT evaluation completed, plan for SNF for rehab -Follow-up with orthopedics Dr. Mardelle Matte in 10 days -Weight bearing instructions as listed above  Postop acute Blood loss anemia -From Intra-Op blood loss, hemodilution and had postop bleeding from surgical site -No further bleeding, status post 2 units of PRBC -Hemoglobin improved and stable at this time  Thrombocytopenia -acute on chronic, at basline slightly low due to Cirrhosis -Worsened acutely postop and in the setting of bleeding and platelet consumption  -Now improving, stabilized and close to normal range now  Type 2 diabetes mellitus -Stable on Victoza and [name] at baseline -Continue [name] 70 units at discharge  NASH/cirrhosis -Stable, at risk of fluid overload -Not on diuretics at baseline -albumin is 2.9 and INR is 1.18  Hypertension -Continue Bystolic, hold lisinopril -restart HCTZ at discharge  Morbid obesity/OSA -Does not use CPAP bedtime, lifestyle modification needed  Chronic kidney disease stage III -Stable, monitor  Depression/anxiety -Continue Klonopin and Cymbalta  Gait disorder left foot drop -PT, rehab  Procedures:      Left intramedullary femoral IM nail: 2/21       R humerus: ORIF  2/22  Consultations:  Orthopedics Dr. [name]   Discharge Exam: Vitals:   04/27/18 2118 04/28/18 0510  BP: (!) 110/43 (!) 113/53  Pulse: 84 78  Resp: 16 18  Temp: 98.1 F (36.7 C) 98.3 F (36.8 C)  SpO2: 96% 93%    General: AAOx3, morbid obesity Cardiovascular: S1-S2/regular rate rhythm  Respiratory: Clear bilaterally  Discharge Instructions    Allergies as of 04/28/2018      Reactions   Adhesive  [tape] Other (See Comments)   BLISTERS   Morphine And Related Other (See Comments)   Hallucinations      Medication List    STOP taking these medications   dicyclomine 20 MG tablet Commonly known as:  BENTYL   lisinopril 40 MG tablet Commonly known as:  PRINIVIL,ZESTRIL   ondansetron 4 MG tablet Commonly known as:  ZOFRAN     TAKE these medications   apixaban 2.5 MG Tabs tablet Commonly known as:  ELIQUIS Take 1 tablet (2.5 mg total) by mouth 2 (two) times daily for 30 days.   BD PEN NEEDLE NANO U/F 32G X 4 MM Misc Generic drug:  Insulin Pen Needle   CENTRUM WOMEN Tabs Take 1 tablet by mouth daily.   clonazePAM 0.5 MG tablet Commonly known as:  KLONOPIN Take 1 tablet (0.5 mg total) by mouth 2 (two) times daily.   diclofenac sodium 1 % Gel Commonly known as:  VOLTAREN APPLY 2 GRAMS TO AFFECTED  AREA(S) TOPICALLY 4 TIMES  DAILY AS NEEDED FOR PAIN What changed:    how much to take  how to take this  when to take this  reasons to take this  additional instructions   DULoxetine 60 MG capsule Commonly known as:  CYMBALTA TAKE 2 CAPSULES BY MOUTH  DAILY What changed:    how much to take  when to take this   gabapentin 300 MG capsule Commonly known as:  NEURONTIN Take 1 capsule (300 mg total) by mouth at bedtime. What changed:  See the new instructions.   hydrochlorothiazide 25 MG tablet Commonly known as:  HYDRODIURIL Take 25 mg by mouth daily.   levothyroxine 25 MCG tablet Commonly known as:  SYNTHROID Take 1 tablet (25 mcg total) by mouth daily before breakfast.   liraglutide 18 MG/3ML Sopn Commonly known as:  VICTOZA Inject 1.8 mg into the skin at bedtime.   methocarbamol 500 MG tablet Commonly known as:  ROBAXIN Take 1 tablet (500 mg total) by mouth every 6 (six) hours as needed for muscle spasms.   nebivolol 10 MG tablet Commonly known as:  BYSTOLIC Take 10 mg by mouth daily.   Oxycodone HCl 10 MG Tabs Take 1 tablet (10 mg total) by  mouth every 6 (six) hours as needed (mod to severe pain). What changed:    when to take this  reasons to take this   pantoprazole 40 MG tablet Commonly known as:  PROTONIX Take 40 mg by mouth 2 (two) times daily. What changed:  Another medication with the same name was removed. Continue taking this medication, and follow the directions you see here.   polyethylene glycol packet Commonly known as:  MIRALAX / GLYCOLAX Take 17 g by mouth daily. Start taking on:  April 29, 2018   rizatriptan 10 MG disintegrating tablet Commonly known as:  MAXALT-MLT Take 10 mg by mouth as needed for migraine.   rosuvastatin 40 MG tablet Commonly known as:  CRESTOR Take 40 mg by mouth daily.   senna-docusate 8.6-50 MG tablet Commonly known as:  Senokot-S Take 1 tablet by mouth 2 (two) times daily.   [name] SOLOSTAR 300 UNIT/ML Sopn Generic drug:  Insulin Glargine (1 Unit Dial) Inject 70 Units into the skin at bedtime. What changed:  how much to take      Allergies  Allergen Reactions  . Adhesive [Tape]  Other (See Comments)    BLISTERS  . Morphine And Related Other (See Comments)    Hallucinations       The results of significant diagnostics from this hospitalization (including imaging, microbiology, ancillary and laboratory) are listed below for reference.    Significant Diagnostic Studies: Dg Chest 1 View  Result Date: 04/22/2018 CLINICAL DATA:  63 year old female status post fall at home tonight. EXAM: CHEST  1 VIEW COMPARISON:  Chest radiograph 08/18/2017 and earlier. FINDINGS: AP supine view at 2106 hours. Stable mediastinal contours, cardiac size at the upper limits of normal. Visualized tracheal air column is within normal limits. Chronic bilateral pulmonary interstitial markings. Otherwise allowing for portable technique the lungs are clear. No pneumothorax or pleural effusion is evident. Chronic right lateral 4th rib fracture appears stable. There is probably also a chronic  left lateral 7th rib fracture. Evidence of proximal right humerus fracture. IMPRESSION: 1.  No acute cardiopulmonary abnormality. 2. Proximal right humerus fracture suspected. 3. Occasional chronic rib fractures. Electronically Signed   By: Genevie Ann M.D.   On: 04/22/2018 21:48   Dg Pelvis 1-2 Views  Result Date: 04/22/2018 CLINICAL DATA:  63 y/o  F; fall with left femur pain. EXAM: PELVIS - 1-2 VIEW; LEFT FEMUR 2 VIEWS COMPARISON:  None. FINDINGS: Pelvis: There is no evidence of pelvic fracture or diastasis. No pelvic bone lesions are seen. Left femur: Acute 3 part intertrochanteric fracture of the left proximal femur extending into the proximal diaphysis. The component of the fracture extending into the femoral shaft demonstrates mild lateral apex angulation and slight displacement. The component including the lesser trochanter is 1/2 shaft's with displaced medially. No hip joint dislocation. IMPRESSION: Acute comminuted intratrochanteric fracture of the left proximal femur extending into proximal diaphysis with angulation and displacement of fracture components. No hip dislocation. No pelvic fracture identified Electronically Signed   By: Kristine Garbe M.D.   On: 04/22/2018 21:51   Dg Shoulder Right  Result Date: 04/22/2018 CLINICAL DATA:  63 year old female status post fall at home tonight. EXAM: RIGHT SHOULDER - 2+ VIEW COMPARISON:  Prior chest radiographs 08/18/2017. FINDINGS: Comminuted and impacted proximal right humerus fracture. The humeral head appears comminuted, with an impacted transverse fracture through the humeral neck. Medial displacement of 1/2 shaft with. The humeral head appears to remain normally aligned with the glenoid. Right clavicle and scapula appear intact. Chronic right lateral 4th rib fracture. IMPRESSION: Comminuted, impacted and medially displaced proximal right humerus fracture. Electronically Signed   By: Genevie Ann M.D.   On: 04/22/2018 21:49   Dg Ankle 2 Views  Left  Result Date: 04/26/2018 CLINICAL DATA:  Left ankle pain since a fall 04/22/2018. History of remote trauma also. EXAM: LEFT ANKLE - 2 VIEW COMPARISON:  Radiographs 01/23/2012. FINDINGS: There is a remote healed distal fibular shaft fracture. The ankle mortise is maintained. No acute ankle fracture is identified. The mid and hindfoot bony structures are intact. Mild degenerative changes. Moderate size calcaneal heel spur. IMPRESSION: Remote healed distal fibular shaft fracture. No acute ankle fracture. Electronically Signed   By: Marijo Sanes M.D.   On: 04/26/2018 10:36   Ct Shoulder Right Wo Contrast  Result Date: 04/22/2018 CLINICAL DATA:  Right shoulder fracture EXAM: CT OF THE UPPER RIGHT EXTREMITY WITHOUT CONTRAST TECHNIQUE: Multidetector CT imaging of the upper right extremity was performed according to the standard protocol. COMPARISON:  Same day radiographs of the right shoulder FINDINGS: Bones/Joint/Cartilage Acute comminuted surgical neck and greater tuberosity fracture of the humeral  head with angulation greater than 45 degrees of the humeral head relative to the anteriorly displaced shaft is identified. Fracture of the greater tuberosity also demonstrates displacement of the fracture fragment and angulation relative to the shaft. The acromioclavicular and glenohumeral joints maintain their articulations. No significant joint effusion. The included ribs appear intact. No scapular fracture. Ligaments Suboptimally assessed by CT. Muscles and Tendons Intramuscular hemorrhage or significant atrophy. Soft tissues No soft tissue hematoma. Mild soft tissue induration overlying the shoulder consistent soft tissue contusion. IMPRESSION: Acute comminuted surgical neck and greater tuberosity fracture of the humeral head with angulation greater than 45 degrees of the humeral head relative to the anteriorly displaced shaft is identified. Fracture of the greater tuberosity also demonstrates displacement of  the fracture fragment and angulation relative to the shaft. Findings would suggest a Neer category 3 part fracture the right proximal humerus. Electronically Signed   By: Ashley Royalty M.D.   On: 04/22/2018 23:25   Dg Shoulder Right Port  Result Date: 04/24/2018 CLINICAL DATA:  ORIF of the right shoulder.  Right humerus fracture. EXAM: PORTABLE RIGHT SHOULDER COMPARISON:  CT 04/22/2018 and radiographs 04/22/2018 FINDINGS: ORIF of the proximal right humerus with plate and screws. Again noted is a comminuted fracture involving the humeral head. Right humeral head appears to be located on these two views. Normal alignment at the right [facility] joint. IMPRESSION: Surgical fixation of the comminuted proximal humeral fracture. Electronically Signed   By: Markus Daft M.D.   On: 04/24/2018 15:29   Dg Humerus Right  Result Date: 04/24/2018 CLINICAL DATA:  Internal fixation of fracture. EXAM: DG C-ARM 61-120 MIN; RIGHT HUMERUS - 2+ VIEW COMPARISON:  Two days prior FINDINGS: 2 intraoperative views demonstrate plate and screw fixation of the proximal humerus . Alignment is improved, without acute hardware complication. IMPRESSION: Intraoperative imaging of proximal right humerus fixation Electronically Signed   By: Abigail Miyamoto M.D.   On: 04/24/2018 14:55   Dg C-arm 1-60 Min  Result Date: 04/24/2018 CLINICAL DATA:  Internal fixation of fracture. EXAM: DG C-ARM 61-120 MIN; RIGHT HUMERUS - 2+ VIEW COMPARISON:  Two days prior FINDINGS: 2 intraoperative views demonstrate plate and screw fixation of the proximal humerus . Alignment is improved, without acute hardware complication. IMPRESSION: Intraoperative imaging of proximal right humerus fixation Electronically Signed   By: Abigail Miyamoto M.D.   On: 04/24/2018 14:55   Dg C-arm 1-60 Min  Result Date: 04/23/2018 CLINICAL DATA:  ORIF left femur. EXAM: LEFT FEMUR 2 VIEWS; DG C-ARM 61-120 MIN COMPARISON:  04/22/2018. FINDINGS: ORIF proximal left femur. Hardware intact. Near  anatomic alignment. Fluoro time 2 minutes 59.3 seconds. Seven images obtained. IMPRESSION: ORIF proximal left femur. Electronically Signed   By: Marcello Moores  Register   On: 04/23/2018 16:12   Dg Femur Min 2 Views Left  Result Date: 04/23/2018 CLINICAL DATA:  ORIF left femur. EXAM: LEFT FEMUR 2 VIEWS; DG C-ARM 61-120 MIN COMPARISON:  04/22/2018. FINDINGS: ORIF proximal left femur. Hardware intact. Near anatomic alignment. Fluoro time 2 minutes 59.3 seconds. Seven images obtained. IMPRESSION: ORIF proximal left femur. Electronically Signed   By: Marcello Moores  Register   On: 04/23/2018 16:12   Dg Femur Min 2 Views Left  Result Date: 04/22/2018 CLINICAL DATA:  63 y/o  F; fall with left femur pain. EXAM: PELVIS - 1-2 VIEW; LEFT FEMUR 2 VIEWS COMPARISON:  None. FINDINGS: Pelvis: There is no evidence of pelvic fracture or diastasis. No pelvic bone lesions are seen. Left femur: Acute 3 part intertrochanteric  fracture of the left proximal femur extending into the proximal diaphysis. The component of the fracture extending into the femoral shaft demonstrates mild lateral apex angulation and slight displacement. The component including the lesser trochanter is 1/2 shaft's with displaced medially. No hip joint dislocation. IMPRESSION: Acute comminuted intratrochanteric fracture of the left proximal femur extending into proximal diaphysis with angulation and displacement of fracture components. No hip dislocation. No pelvic fracture identified Electronically Signed   By: Kristine Garbe M.D.   On: 04/22/2018 21:51   US Abdomen Limited Ruq  Result Date: 04/07/2018 CLINICAL DATA:  Nonalcoholic steatohepatitis EXAM: ULTRASOUND ABDOMEN LIMITED RIGHT UPPER QUADRANT COMPARISON:  CT abdomen and pelvis September 30, 2017 FINDINGS: Gallbladder: Surgically absent. Common bile duct: Diameter: 4 mm. No intrahepatic or extrahepatic biliary duct dilatation. Liver: No focal lesion identified. Liver contour is somewhat nodular. There is  diffuse increase in liver echogenicity. Portal vein is patent on color Doppler imaging with normal direction of blood flow towards the liver. IMPRESSION: 1. Diffuse increase in liver echogenicity, a finding indicative of hepatic steatosis. Underlying parenchymal liver disease can not be excluded. Liver has a somewhat nodular contour raising concern for underlying hepatic cirrhosis. While no focal liver lesions are evident on this study, it must be cautioned that the sensitivity of ultrasound for detection of focal liver lesions is diminished in this circumstance. 2.  Gallbladder absent. Electronically Signed   By: Lowella Grip III M.D.   On: 04/07/2018 08:13    Microbiology: Recent Results (from the past 240 hour(s))  Surgical pcr screen     Status: None   Collection Time: 04/23/18  2:21 AM  Result Value Ref Range Status   MRSA, PCR NEGATIVE NEGATIVE Final   Staphylococcus aureus NEGATIVE NEGATIVE Final    Comment: (NOTE) The Xpert SA Assay (FDA approved for NASAL specimens in patients 3 years of age and older), is one component of a comprehensive surveillance program. It is not intended to diagnose infection nor to guide or monitor treatment. Performed at [facility], [place] [address], [place], [place] [address]      Labs: Basic Metabolic Panel: Recent Labs  Lab 04/24/18 0114 04/25/18 0245 04/25/18 0743 04/26/18 0432 04/27/18 0827  NA 135 135 135 134* 135  K 4.3 4.0 4.0 4.1 3.8  CL 101 103 102 104 102  CO2 24 22 27 24 27   GLUCOSE 231* 129* 126* 122* 106*  BUN 33* 34* 31* 28* 22  CREATININE 1.34* 1.19* 1.13* 0.99 0.92  CALCIUM 8.2* 7.8* 8.1* 8.0* 8.4*   Liver Function Tests: Recent Labs  Lab 04/23/18 0552 04/24/18 0114 04/25/18 0245 04/26/18 0432  AST 47* 57* 53* 55*  ALT 27 26 19 18   ALKPHOS 66 51 40 52  BILITOT 0.8 0.8 0.9 1.0  PROT 7.0 6.3* 5.5* 5.6*  ALBUMIN 2.9* 2.9* 2.7* 2.7*   No results for input(s): LIPASE, AMYLASE in the last 168 hours. Recent  Labs  Lab 04/23/18 0552  AMMONIA 28   CBC: Recent Labs  Lab 04/22/18 2013  04/25/18 0245 04/25/18 1109 04/26/18 0707 04/27/18 0827 04/28/18 0855  WBC 7.2   < > 8.5 8.4 6.9 7.4 6.8  NEUTROABS 5.0  --   --   --   --   --   --   HGB 12.5   < > 7.7* 7.7* 8.7* 9.1* 9.0*  HCT 40.9   < > 23.9* 24.1* 27.7* 29.0* 28.3*  MCV 84.7   < > 84.5 84.3 85.5 87.6  86.5  PLT PLATELET CLUMPS NOTED ON SMEAR, COUNT APPEARS DECREASED   < > 76* 85* 97* 133* 148*   < > = values in this interval not displayed.   Cardiac Enzymes: No results for input(s): CKTOTAL, CKMB, CKMBINDEX, TROPONINI in the last 168 hours. BNP: BNP (last 3 results) Recent Labs    04/23/18 0552  BNP 26.7    ProBNP (last 3 results) No results for input(s): PROBNP in the last 8760 hours.  CBG: Recent Labs  Lab 04/27/18 0822 04/27/18 1344 04/27/18 1715 04/27/18 2117 04/28/18 0749  GLUCAP 84 143* 166* 157* 74       Signed:  Domenic Polite MD.  [facility] 04/28/2018, 12:01 PM

## 2018-04-28 NOTE — Progress Notes (Signed)
Occupational Therapy Treatment Patient Details Name: Katherine [name] MRN: [id number] DOB: 12-15-1955 Today's Date: 04/28/2018    History of present illness This is a 63 year old morbidly obese female with history of diabetes, hypertension, and dyslipidemia, subdural hematoma, left foot drop, gait disorder, Nash/cirrhosis sleep apnea, chronic kidney disease stage III presented following a mechanical fall to the ED with left thigh and right shoulder pain, the emergency room patient was noted to have a left intertrochanteric femur fracture and right comminuted proximal humerus fracture.IM nail left femur 2/21 and ORIF right proximal humerus fx on 2/22.   OT comments  Pt performing RUE sling management with totalA. Pt performing RUE elbow, wrist and hand exercises starting in AAROM and ending in AROM with cues for proper technique. Pt's pain currently uncontrolled and unable to assist pt to EOB. Pt requiring increased assist for ADL and mobility and cannot return home until increased independence. Pt requires continued OT skilled services for ADL, mobility and safety in CIR setting. OT to follow acutely and mobilize.  Follow Up Recommendations  CIR;Supervision/Assistance - 24 hour    Equipment Recommendations  Other (comment)(TBD)    Recommendations for Other Services      Precautions / Restrictions Precautions Precautions: Fall;Shoulder Type of Shoulder Precautions: elbow, wrist, hand exercies OK; NO ROM of shoulder Shoulder Interventions: Shoulder sling/immobilizer;Off for dressing/bathing/exercises Precaution Booklet Issued: No Required Braces or Orthoses: Sling Restrictions Weight Bearing Restrictions: Yes RUE Weight Bearing: Non weight bearing LLE Weight Bearing: Weight bearing as tolerated       Mobility Bed Mobility Overal bed mobility: Needs Assistance             General bed mobility comments: Pt in too severe of pain to mobilize in bed  Transfers Overall transfer  level: Needs assistance               General transfer comment: pain uncontrolled. unable to attempt    Balance Overall balance assessment: Needs assistance                                         ADL either performed or assessed with clinical judgement   ADL                                         General ADL Comments: requires assist as RUE in sling     Vision       Perception     Praxis      Cognition Arousal/Alertness: Awake/alert Behavior During Therapy: Anxious Overall Cognitive Status: Within Functional Limits for tasks assessed                                          Exercises Exercises: General Upper Extremity General Exercises - Upper Extremity Elbow Flexion: AAROM;10 reps;Supine Elbow Extension: AAROM;10 reps;Right;Supine Wrist Flexion: AROM;Right;10 reps;Supine Wrist Extension: AROM;Right;10 reps;Supine Digit Composite Flexion: AROM;10 reps;Supine   Shoulder Instructions       General Comments      Pertinent Vitals/ Pain       Pain Assessment: Faces Faces Pain Scale: Hurts whole lot Pain Location: RUE Pain Descriptors / Indicators: Grimacing;Guarding;Moaning Pain Intervention(s): Limited activity within patient's tolerance;RN gave pain meds during  session  Home Living                                          Prior Functioning/Environment              Frequency  Min 3X/week        Progress Toward Goals  OT Goals(current goals can now be found in the care plan section)  Progress towards OT goals: Progressing toward goals  Acute Rehab OT Goals Patient Stated Goal: to go to rehab then home OT Goal Formulation: With patient Time For Goal Achievement: 05/11/18 Potential to Achieve Goals: Good ADL Goals Pt Will Perform Grooming: with min guard assist;sitting Pt Will Transfer to Toilet: with min guard assist;stand pivot transfer;bedside commode Pt/caregiver  will Perform Home Exercise Program: Left upper extremity;With Supervision;With written HEP provided Additional ADL Goal #1: Pt will be S in and OOB for basic ADLs  Plan Discharge plan remains appropriate    Co-evaluation                 AM-PAC OT "6 Clicks" Daily Activity     Outcome Measure   Help from another person eating meals?: A Little Help from another person taking care of personal grooming?: A Lot Help from another person toileting, which includes using toliet, bedpan, or urinal?: Total Help from another person bathing (including washing, rinsing, drying)?: A Lot Help from another person to put on and taking off regular upper body clothing?: A Lot Help from another person to put on and taking off regular lower body clothing?: Total 6 Click Score: 11    End of Session    OT Visit Diagnosis: Unsteadiness on feet (R26.81);Other abnormalities of gait and mobility (R26.89);Muscle weakness (generalized) (M62.81);History of falling (Z91.81);Pain Pain - Right/Left: Left Pain - part of body: Arm   Activity Tolerance Patient limited by pain   Patient Left in bed;with call bell/phone within reach;with bed alarm set   Nurse Communication Mobility status        Time: 7564-3329 OT Time Calculation (min): 30 min  Charges: OT General Charges $OT Visit: 1 Visit OT Treatments $Self Care/Home Management : 8-22 mins $Therapeutic Exercise: 8-22 mins  Darryl Nestle) Marsa Aris OTR/L Acute Rehabilitation Services Pager: [phone] Office: [phone]    Fredda Hammed 04/28/2018, 6:15 PM

## 2018-04-28 NOTE — Plan of Care (Signed)
  Problem: Clinical Measurements: Goal: Respiratory complications will improve Outcome: Progressing Goal: Cardiovascular complication will be avoided Outcome: Progressing   Problem: Activity: Goal: Risk for activity intolerance will decrease Outcome: Progressing   Problem: Nutrition: Goal: Adequate nutrition will be maintained Outcome: Progressing   

## 2018-04-28 NOTE — Social Work (Addendum)
3:20pm- Pt family returned call, they have chosen [facility], requested Levada Dy with [employer] initiate insurance approval.  2:53pm- Have left voice message for pt daughter in law [name] in regards to pt SNF offers that were given and emailed today. Await return call to initiate insurance approval.  Westley Hummer, MSW, [place] [facility] [phone]

## 2018-04-28 NOTE — Social Work (Addendum)
11:13am- Pt daughter in law Melanie reached on cell phone, discussed options and SNF placement. She requested CSW send information to [email or web address] so she can review offers. We discussed placement coverage under [facility] Medicare and pt daughter in law will also look into any additional supplemental coverage pt may have that is not listed on our records.   10:22am- Pt provided with CMS list of offers.  Requested CSW call her daughter in law [name] to discuss them also.  CSW confirmed pt daughter in law number, will f/u with her.  Westley Hummer, MSW, [place] [employer] [phone]

## 2018-04-29 ENCOUNTER — Encounter (HOSPITAL_COMMUNITY): Payer: Self-pay | Admitting: Orthopedic Surgery

## 2018-04-29 DIAGNOSIS — D649 Anemia, unspecified: Secondary | ICD-10-CM

## 2018-04-29 LAB — CBC
HCT: 30.4 % — ABNORMAL LOW (ref 36.0–46.0)
Hemoglobin: 9.5 g/dL — ABNORMAL LOW (ref 12.0–15.0)
MCH: 26.9 pg (ref 26.0–34.0)
MCHC: 31.3 g/dL (ref 30.0–36.0)
MCV: 86.1 fL (ref 80.0–100.0)
NRBC: 0.5 % — AB (ref 0.0–0.2)
PLATELETS: 185 10*3/uL (ref 150–400)
RBC: 3.53 MIL/uL — ABNORMAL LOW (ref 3.87–5.11)
RDW: 15.6 % — ABNORMAL HIGH (ref 11.5–15.5)
WBC: 7.5 10*3/uL (ref 4.0–10.5)

## 2018-04-29 LAB — GLUCOSE, CAPILLARY
GLUCOSE-CAPILLARY: 74 mg/dL (ref 70–99)
Glucose-Capillary: 108 mg/dL — ABNORMAL HIGH (ref 70–99)

## 2018-04-29 MED ORDER — CLONAZEPAM 0.5 MG PO TABS: 0.2500 mg | ORAL_TABLET | Freq: Every day | ORAL | 0 refills | Status: DC

## 2018-04-29 MED ORDER — CLONAZEPAM 0.5 MG PO TABS
0.2500 mg | ORAL_TABLET | Freq: Every day | ORAL | Status: DC
  Administered 2018-04-29: 0.25 mg via ORAL

## 2018-04-29 NOTE — Discharge Summary (Signed)
Physician Discharge Summary  Katherine [name] WLN:[id number] DOB: 1955-10-06 DOA: 04/22/2018  PCP: Mayra Neer, MD  Admit date: 04/22/2018 Discharge date: 04/29/2018    Recommendations for Outpatient Follow-up:  1.  Instructions per orthopedics: WBAT left lower ext, NWB right upper ext, Sling at all times except with PT/OT 2.  Please check basic metabolic panel in 3 days 3.  Continue low-dose Eliquis for DVT prophylaxis for 30 days 4.  Orthopedics Dr. Mardelle Matte in 10 days       Discharge Diagnoses:  Principal Problem:   Closed comminuted intertrochanteric fracture of proximal femur, left, initial encounter ([place]) Postop acute blood loss anemia Thrombocytopenia   Type II diabetes mellitus with renal manifestations (HCC)   HLD (hyperlipidemia)   Essential hypertension   Depression with anxiety   Hypothyroidism   GERD (gastroesophageal reflux disease)   CKD (chronic kidney disease), stage III ([place])   Fall   Closed comminuted fracture of right humerus   NASH (nonalcoholic steatohepatitis)   Liver cirrhosis ([place])   Discharge Condition: Stable  Diet recommendation: Low-sodium, diabetic, heart healthy  Filed Weights   04/22/18 2008 04/23/18 0022 04/23/18 1223  Weight: 125.2 kg 130.4 kg 129.7 kg    History of present illness:   63 year old morbidly obese female with history of diabetes, hypertension, and dyslipidemia, subdural hematoma, left foot drop, gait disorder, Nash/cirrhosis sleep apnea, chronic kidney disease stage III presented following a mechanical fall to the ED with left thigh and right shoulder pain, the emergency room patient was noted to have a left intertrochanteric femur fracture and right comminuted proximal humerus fracture.  Hospital Course:   Closed comminuted intertrochanteric fracture of proximal femur, left, and comminuted fracture of right humerus -Ortho consulting, underwent staged surgical intervention with the left femur repaired 2/21 with  intramedullary nail, and then underwent ORIF of R prox humerus 2/22 -high risk of VTE, started low-dose Eliquis twice daily for DVT prophylaxis, continue this for 4 weeks -caution with oversedation -Postop course was complicated by blood loss anemia, now improved and stable  -PT OT evaluation completed, plan for SNF for rehab -Follow-up with orthopedics Dr. Mardelle Matte in 10 days -Weight bearing instructions as listed above  Acute blood loss anemia from post-op Blood loss -From Intra-Op blood loss, hemodilution and had postop bleeding from surgical site -No further bleeding, status post 2 units of PRBC -Hgb stable  Thrombocytopenia -acute on chronic, at basline slightly low due to Cirrhosis -Worsened acutely postop and in the setting of bleeding and platelet consumption  -Now improving, stabilized and close to normal range now  Type 2 diabetes mellitus -Stable on Victoza and [name] at baseline -Continue [name] 70 units at discharge  NASH/cirrhosis -Stable, at risk of fluid overload -Not on diuretics at baseline -albumin is 2.9 and INR is 1.18  Hypertension -Continue Bystolic, hold lisinopril -restart HCTZ at discharge  Morbid obesity/OSA -Does not use CPAP bedtime, lifestyle modification needed  Chronic kidney disease stage III -Stable, monitor  Depression/anxiety -Continue Cymbalta -Reduce clonazepam while on opiates for post-op pain, may increase dose back to 0.5 mg BID if needed -Over long term, this medicine is associated with falls and should be completely discontinued  Gait disorder left foot drop -PT, rehab  Procedures:      Left intramedullary femoral IM nail: 2/21       R humerus: ORIF  2/22  Consultations:  Orthopedics Dr. [name]   Subjective: Somewhat anxious today, but pain well controlled.  No fever, respiratory distress, cough.   Discharge  Exam: Vitals:   04/28/18 2124 04/29/18 0614  BP: (!) 100/48 (!) 117/57  Pulse: 88 80   Resp: 20 16  Temp: 98.6 F (37 C) 97.9 F (36.6 C)  SpO2: 94% 95%    General: Alert and oriented, no acute distress Cardiovascular: RRR, no murmurs, no LE edema Abdomen: No tendnerness to palpation, soft. Neuro: Moves left arm and right leg without difficulty, other limbs limited by pain.  Appears anxious, but mentating normally.  Discharge Instructions    Allergies as of 04/29/2018      Reactions   Adhesive [tape] Other (See Comments)   BLISTERS   Morphine And Related Other (See Comments)   Hallucinations      Medication List    STOP taking these medications   dicyclomine 20 MG tablet Commonly known as:  BENTYL   lisinopril 40 MG tablet Commonly known as:  PRINIVIL,ZESTRIL   ondansetron 4 MG tablet Commonly known as:  ZOFRAN     TAKE these medications   apixaban 2.5 MG Tabs tablet Commonly known as:  ELIQUIS Take 1 tablet (2.5 mg total) by mouth 2 (two) times daily for 30 days.   BD PEN NEEDLE NANO U/F 32G X 4 MM Misc Generic drug:  Insulin Pen Needle   CENTRUM WOMEN Tabs Take 1 tablet by mouth daily.   clonazePAM 0.5 MG tablet Commonly known as:  KLONOPIN Take 0.5 tablets (0.25 mg total) by mouth daily for 4 days. What changed:    how much to take  when to take this   diclofenac sodium 1 % Gel Commonly known as:  VOLTAREN APPLY 2 GRAMS TO AFFECTED  AREA(S) TOPICALLY 4 TIMES  DAILY AS NEEDED FOR PAIN What changed:    how much to take  how to take this  when to take this  reasons to take this  additional instructions   DULoxetine 60 MG capsule Commonly known as:  CYMBALTA TAKE 2 CAPSULES BY MOUTH  DAILY What changed:    how much to take  when to take this   gabapentin 300 MG capsule Commonly known as:  NEURONTIN Take 1 capsule (300 mg total) by mouth at bedtime. What changed:  See the new instructions.   hydrochlorothiazide 25 MG tablet Commonly known as:  HYDRODIURIL Take 25 mg by mouth daily.   levothyroxine 25 MCG  tablet Commonly known as:  SYNTHROID Take 1 tablet (25 mcg total) by mouth daily before breakfast.   liraglutide 18 MG/3ML Sopn Commonly known as:  VICTOZA Inject 1.8 mg into the skin at bedtime.   methocarbamol 500 MG tablet Commonly known as:  ROBAXIN Take 1 tablet (500 mg total) by mouth every 6 (six) hours as needed for muscle spasms.   nebivolol 10 MG tablet Commonly known as:  BYSTOLIC Take 10 mg by mouth daily.   Oxycodone HCl 10 MG Tabs Take 1 tablet (10 mg total) by mouth every 6 (six) hours as needed (mod to severe pain). What changed:    when to take this  reasons to take this   pantoprazole 40 MG tablet Commonly known as:  PROTONIX Take 40 mg by mouth 2 (two) times daily. What changed:  Another medication with the same name was removed. Continue taking this medication, and follow the directions you see here.   polyethylene glycol packet Commonly known as:  MIRALAX / GLYCOLAX Take 17 g by mouth daily.   rizatriptan 10 MG disintegrating tablet Commonly known as:  MAXALT-MLT Take 10 mg by mouth as  needed for migraine.   rosuvastatin 40 MG tablet Commonly known as:  CRESTOR Take 40 mg by mouth daily.   senna-docusate 8.6-50 MG tablet Commonly known as:  Senokot-S Take 1 tablet by mouth 2 (two) times daily.   [name] SOLOSTAR 300 UNIT/ML Sopn Generic drug:  Insulin Glargine (1 Unit Dial) Inject 70 Units into the skin at bedtime. What changed:  how much to take      Allergies  Allergen Reactions  . Adhesive [Tape] Other (See Comments)    BLISTERS  . Morphine And Related Other (See Comments)    Hallucinations    Contact information for after-discharge care    Destination    [facility] Preferred SNF .   Service:  Skilled Nursing Contact information: [place] [phone]               The results of significant diagnostics from this hospitalization (including imaging,  microbiology, ancillary and laboratory) are listed below for reference.    Significant Diagnostic Studies: Dg Chest 1 View  Result Date: 04/22/2018 CLINICAL DATA:  63 year old female status post fall at home tonight. EXAM: CHEST  1 VIEW COMPARISON:  Chest radiograph 08/18/2017 and earlier. FINDINGS: AP supine view at 2106 hours. Stable mediastinal contours, cardiac size at the upper limits of normal. Visualized tracheal air column is within normal limits. Chronic bilateral pulmonary interstitial markings. Otherwise allowing for portable technique the lungs are clear. No pneumothorax or pleural effusion is evident. Chronic right lateral 4th rib fracture appears stable. There is probably also a chronic left lateral 7th rib fracture. Evidence of proximal right humerus fracture. IMPRESSION: 1.  No acute cardiopulmonary abnormality. 2. Proximal right humerus fracture suspected. 3. Occasional chronic rib fractures. Electronically Signed   By: Genevie Ann M.D.   On: 04/22/2018 21:48   Dg Pelvis 1-2 Views  Result Date: 04/22/2018 CLINICAL DATA:  63 y/o  F; fall with left femur pain. EXAM: PELVIS - 1-2 VIEW; LEFT FEMUR 2 VIEWS COMPARISON:  None. FINDINGS: Pelvis: There is no evidence of pelvic fracture or diastasis. No pelvic bone lesions are seen. Left femur: Acute 3 part intertrochanteric fracture of the left proximal femur extending into the proximal diaphysis. The component of the fracture extending into the femoral shaft demonstrates mild lateral apex angulation and slight displacement. The component including the lesser trochanter is 1/2 shaft's with displaced medially. No hip joint dislocation. IMPRESSION: Acute comminuted intratrochanteric fracture of the left proximal femur extending into proximal diaphysis with angulation and displacement of fracture components. No hip dislocation. No pelvic fracture identified Electronically Signed   By: Kristine Garbe M.D.   On: 04/22/2018 21:51   Dg Shoulder  Right  Result Date: 04/22/2018 CLINICAL DATA:  63 year old female status post fall at home tonight. EXAM: RIGHT SHOULDER - 2+ VIEW COMPARISON:  Prior chest radiographs 08/18/2017. FINDINGS: Comminuted and impacted proximal right humerus fracture. The humeral head appears comminuted, with an impacted transverse fracture through the humeral neck. Medial displacement of 1/2 shaft with. The humeral head appears to remain normally aligned with the glenoid. Right clavicle and scapula appear intact. Chronic right lateral 4th rib fracture. IMPRESSION: Comminuted, impacted and medially displaced proximal right humerus fracture. Electronically Signed   By: Genevie Ann M.D.   On: 04/22/2018 21:49   Dg Ankle 2 Views Left  Result Date: 04/26/2018 CLINICAL DATA:  Left ankle pain since a fall 04/22/2018. History of remote trauma also. EXAM: LEFT ANKLE - 2 VIEW COMPARISON:  Radiographs 01/23/2012. FINDINGS: There is a remote healed distal fibular shaft fracture. The ankle mortise is maintained. No acute ankle fracture is identified. The mid and hindfoot bony structures are intact. Mild degenerative changes. Moderate size calcaneal heel spur. IMPRESSION: Remote healed distal fibular shaft fracture. No acute ankle fracture. Electronically Signed   By: Marijo Sanes M.D.   On: 04/26/2018 10:36   Ct Shoulder Right Wo Contrast  Result Date: 04/22/2018 CLINICAL DATA:  Right shoulder fracture EXAM: CT OF THE UPPER RIGHT EXTREMITY WITHOUT CONTRAST TECHNIQUE: Multidetector CT imaging of the upper right extremity was performed according to the standard protocol. COMPARISON:  Same day radiographs of the right shoulder FINDINGS: Bones/Joint/Cartilage Acute comminuted surgical neck and greater tuberosity fracture of the humeral head with angulation greater than 45 degrees of the humeral head relative to the anteriorly displaced shaft is identified. Fracture of the greater tuberosity also demonstrates displacement of the fracture fragment  and angulation relative to the shaft. The acromioclavicular and glenohumeral joints maintain their articulations. No significant joint effusion. The included ribs appear intact. No scapular fracture. Ligaments Suboptimally assessed by CT. Muscles and Tendons Intramuscular hemorrhage or significant atrophy. Soft tissues No soft tissue hematoma. Mild soft tissue induration overlying the shoulder consistent soft tissue contusion. IMPRESSION: Acute comminuted surgical neck and greater tuberosity fracture of the humeral head with angulation greater than 45 degrees of the humeral head relative to the anteriorly displaced shaft is identified. Fracture of the greater tuberosity also demonstrates displacement of the fracture fragment and angulation relative to the shaft. Findings would suggest a Neer category 3 part fracture the right proximal humerus. Electronically Signed   By: Ashley Royalty M.D.   On: 04/22/2018 23:25   Dg Shoulder Right Port  Result Date: 04/24/2018 CLINICAL DATA:  ORIF of the right shoulder.  Right humerus fracture. EXAM: PORTABLE RIGHT SHOULDER COMPARISON:  CT 04/22/2018 and radiographs 04/22/2018 FINDINGS: ORIF of the proximal right humerus with plate and screws. Again noted is a comminuted fracture involving the humeral head. Right humeral head appears to be located on these two views. Normal alignment at the right [facility] joint. IMPRESSION: Surgical fixation of the comminuted proximal humeral fracture. Electronically Signed   By: Markus Daft M.D.   On: 04/24/2018 15:29   Dg Humerus Right  Result Date: 04/24/2018 CLINICAL DATA:  Internal fixation of fracture. EXAM: DG C-ARM 61-120 MIN; RIGHT HUMERUS - 2+ VIEW COMPARISON:  Two days prior FINDINGS: 2 intraoperative views demonstrate plate and screw fixation of the proximal humerus . Alignment is improved, without acute hardware complication. IMPRESSION: Intraoperative imaging of proximal right humerus fixation Electronically Signed   By: Abigail Miyamoto  M.D.   On: 04/24/2018 14:55   Dg C-arm 1-60 Min  Result Date: 04/24/2018 CLINICAL DATA:  Internal fixation of fracture. EXAM: DG C-ARM 61-120 MIN; RIGHT HUMERUS - 2+ VIEW COMPARISON:  Two days prior FINDINGS: 2 intraoperative views demonstrate plate and screw fixation of the proximal humerus . Alignment is improved, without acute hardware complication. IMPRESSION: Intraoperative imaging of proximal right humerus fixation Electronically Signed   By: Abigail Miyamoto M.D.   On: 04/24/2018 14:55   Dg C-arm 1-60 Min  Result Date: 04/23/2018 CLINICAL DATA:  ORIF left femur. EXAM: LEFT FEMUR 2 VIEWS; DG C-ARM 61-120 MIN COMPARISON:  04/22/2018. FINDINGS: ORIF proximal left femur. Hardware intact. Near anatomic alignment. Fluoro time 2 minutes 59.3 seconds. Seven images obtained. IMPRESSION: ORIF proximal left femur. Electronically Signed   By: Marcello Moores  Register   On:  04/23/2018 16:12   Dg Femur Min 2 Views Left  Result Date: 04/23/2018 CLINICAL DATA:  ORIF left femur. EXAM: LEFT FEMUR 2 VIEWS; DG C-ARM 61-120 MIN COMPARISON:  04/22/2018. FINDINGS: ORIF proximal left femur. Hardware intact. Near anatomic alignment. Fluoro time 2 minutes 59.3 seconds. Seven images obtained. IMPRESSION: ORIF proximal left femur. Electronically Signed   By: Marcello Moores  Register   On: 04/23/2018 16:12   Dg Femur Min 2 Views Left  Result Date: 04/22/2018 CLINICAL DATA:  63 y/o  F; fall with left femur pain. EXAM: PELVIS - 1-2 VIEW; LEFT FEMUR 2 VIEWS COMPARISON:  None. FINDINGS: Pelvis: There is no evidence of pelvic fracture or diastasis. No pelvic bone lesions are seen. Left femur: Acute 3 part intertrochanteric fracture of the left proximal femur extending into the proximal diaphysis. The component of the fracture extending into the femoral shaft demonstrates mild lateral apex angulation and slight displacement. The component including the lesser trochanter is 1/2 shaft's with displaced medially. No hip joint dislocation. IMPRESSION:  Acute comminuted intratrochanteric fracture of the left proximal femur extending into proximal diaphysis with angulation and displacement of fracture components. No hip dislocation. No pelvic fracture identified Electronically Signed   By: Kristine Garbe M.D.   On: 04/22/2018 21:51   US Abdomen Limited Ruq  Result Date: 04/07/2018 CLINICAL DATA:  Nonalcoholic steatohepatitis EXAM: ULTRASOUND ABDOMEN LIMITED RIGHT UPPER QUADRANT COMPARISON:  CT abdomen and pelvis September 30, 2017 FINDINGS: Gallbladder: Surgically absent. Common bile duct: Diameter: 4 mm. No intrahepatic or extrahepatic biliary duct dilatation. Liver: No focal lesion identified. Liver contour is somewhat nodular. There is diffuse increase in liver echogenicity. Portal vein is patent on color Doppler imaging with normal direction of blood flow towards the liver. IMPRESSION: 1. Diffuse increase in liver echogenicity, a finding indicative of hepatic steatosis. Underlying parenchymal liver disease can not be excluded. Liver has a somewhat nodular contour raising concern for underlying hepatic cirrhosis. While no focal liver lesions are evident on this study, it must be cautioned that the sensitivity of ultrasound for detection of focal liver lesions is diminished in this circumstance. 2.  Gallbladder absent. Electronically Signed   By: Lowella Grip III M.D.   On: 04/07/2018 08:13    Microbiology: Recent Results (from the past 240 hour(s))  Surgical pcr screen     Status: None   Collection Time: 04/23/18  2:21 AM  Result Value Ref Range Status   MRSA, PCR NEGATIVE NEGATIVE Final   Staphylococcus aureus NEGATIVE NEGATIVE Final    Comment: (NOTE) The Xpert SA Assay (FDA approved for NASAL specimens in patients 48 years of age and older), is one component of a comprehensive surveillance program. It is not intended to diagnose infection nor to guide or monitor treatment. Performed at [facility], [place] [address],  [place], [place] [address]      Labs: Basic Metabolic Panel: Recent Labs  Lab 04/24/18 0114 04/25/18 0245 04/25/18 0743 04/26/18 0432 04/27/18 0827  NA 135 135 135 134* 135  K 4.3 4.0 4.0 4.1 3.8  CL 101 103 102 104 102  CO2 24 22 27 24 27   GLUCOSE 231* 129* 126* 122* 106*  BUN 33* 34* 31* 28* 22  CREATININE 1.34* 1.19* 1.13* 0.99 0.92  CALCIUM 8.2* 7.8* 8.1* 8.0* 8.4*   Liver Function Tests: Recent Labs  Lab 04/23/18 0552 04/24/18 0114 04/25/18 0245 04/26/18 0432  AST 47* 57* 53* 55*  ALT 27 26 19 18   ALKPHOS 66 51 40 52  BILITOT 0.8 0.8 0.9 1.0  PROT 7.0 6.3* 5.5* 5.6*  ALBUMIN 2.9* 2.9* 2.7* 2.7*   No results for input(s): LIPASE, AMYLASE in the last 168 hours. Recent Labs  Lab 04/23/18 0552  AMMONIA 28   CBC: Recent Labs  Lab 04/22/18 2013  04/25/18 1109 04/26/18 0707 04/27/18 0827 04/28/18 0855 04/29/18 0354  WBC 7.2   < > 8.4 6.9 7.4 6.8 7.5  NEUTROABS 5.0  --   --   --   --   --   --   HGB 12.5   < > 7.7* 8.7* 9.1* 9.0* 9.5*  HCT 40.9   < > 24.1* 27.7* 29.0* 28.3* 30.4*  MCV 84.7   < > 84.3 85.5 87.6 86.5 86.1  PLT PLATELET CLUMPS NOTED ON SMEAR, COUNT APPEARS DECREASED   < > 85* 97* 133* 148* 185   < > = values in this interval not displayed.   Cardiac Enzymes: No results for input(s): CKTOTAL, CKMB, CKMBINDEX, TROPONINI in the last 168 hours. BNP: BNP (last 3 results) Recent Labs    04/23/18 0552  BNP 26.7    ProBNP (last 3 results) No results for input(s): PROBNP in the last 8760 hours.  CBG: Recent Labs  Lab 04/28/18 0749 04/28/18 1204 04/28/18 1722 04/28/18 2118 04/29/18 0825  GLUCAP 74 169* 164* 162* 74       Signed:  Edwin Dada MD.  [facility] 04/29/2018, 9:54 AM

## 2018-04-29 NOTE — Progress Notes (Signed)
Report called to Miller at [facility].

## 2018-04-29 NOTE — Progress Notes (Addendum)
Nutrition Follow-up  DOCUMENTATION CODES:   Obesity unspecified  INTERVENTION:   -Continue MVI with minerals daily -Continue Glucerna Shake po BID, each supplement provides 220 kcal and 10 grams of protein  NUTRITION DIAGNOSIS:   Increased nutrient needs related to post-op healing as evidenced by estimated needs.  Ongoing  GOAL:   Patient will meet greater than or equal to 90% of their needs  Progressing  MONITOR:   PO intake, Supplement acceptance, Diet advancement, Weight trends, Skin, I & O's  REASON FOR ASSESSMENT:   Consult Assessment of nutrition requirement/status  ASSESSMENT:   Katherine [name] is a 63 y.o. female with medical history significant of hypertension, hyperlipidemia, diabetes mellitus, GERD, hypothyroidism, depression, anxiety, migraine headache, subdural hematoma, left foot drop, obesity, OSA not on CPAP, pseudoseizure, Nash and liver cirrhosis, GI bleeding, CKD stage III, who presents with fall, pain in left upper leg and right shoulder.  2/22- s/p PROCEDURE:  OPEN REDUCTION INTERNAL FIXATION (ORIF) PROXIMAL HUMERUS FRACTURE with tuberosity repair 2/25- s/p PROCEDURE:  OPEN REDUCTION INTERNAL FIXATION (ORIF) PROXIMAL HUMERUS FRACTURE  Reviewed I/O's: +40 ml x 24 hours and -1.2 L since admission  Pt resting quietlyl at time of visit; did not disturb. Pt remains with good appetite and meal completion verified at 50-100% per doc flowsheets. Pt is also compliant with MVI and Glucerna supplements.   Per CSW, pt medically ready for discharge, but awaiting insurance approval for d/c to SNF.   Labs reviewed: CBGS: 74-169 (inpatient orders for glycemic control are 0-9 units insulin aspart TID with meals and 60 units insulin glargine q HS).   Diet Order:   Diet Order            Diet Carb Modified Fluid consistency: Thin; Room service appropriate? Yes  Diet effective now              EDUCATION NEEDS:   Education needs have been addressed  Skin:   Skin Assessment: Reviewed RN Assessment  Last BM:  04/21/18  Height:   Ht Readings from Last 1 Encounters:  04/23/18 5' 6"  (1.676 m)    Weight:   Wt Readings from Last 1 Encounters:  04/23/18 129.7 kg    Ideal Body Weight:  59.1 kg  BMI:  Body mass index is 46.16 kg/m.  Estimated Nutritional Needs:   Kcal:  1500-1700  Protein:  95-110 grams  Fluid:  > 1.5 L    Katherine [name] A. Jimmye Norman, RD, LDN, CDE Pager: [phone] After hours Pager: [phone]

## 2018-04-29 NOTE — Clinical Social Work Placement (Signed)
   CLINICAL SOCIAL WORK PLACEMENT  NOTE [facility]- pick up at 3pm RN to call report to [phone]  Date:  04/29/2018  Patient Details  Name: Katherine [name] MRN: [id number] Date of Birth: February 03, 1956  Clinical Social Work is seeking post-discharge placement for this patient at the [place] level of care (*CSW will initial, date and re-position this form in  chart as items are completed):  Yes   Patient/family provided with [facility] Work Department's list of facilities offering this level of care within the geographic area requested by the patient (or if unable, by the patient's family).  Yes   Patient/family informed of their freedom to choose among providers that offer the needed level of care, that participate in Medicare, Medicaid or managed care program needed by the patient, have an available bed and are willing to accept the patient.  Yes   Patient/family informed of [facility]'s ownership interest in [facility] and [facility], as well as of the fact that they are under no obligation to receive care at these facilities.  PASRR submitted to EDS on 04/28/18     PASRR number received on 04/28/18     Existing PASRR number confirmed on       FL2 transmitted to all facilities in geographic area requested by pt/family on 04/27/18     FL2 transmitted to all facilities within larger geographic area on       Patient informed that his/her managed care company has contracts with or will negotiate with certain facilities, including the following:        Yes   Patient/family informed of bed offers received.  Patient chooses bed at [place] [place]     Physician recommends and patient chooses bed at      Patient to be transferred to [facility] on 04/29/18.  Patient to be transferred to facility by [facility]     Patient family notified on   of transfer.  Name of family member notified:  pt daughter in law  Melanie     PHYSICIAN Please prepare priority discharge summary, including medications, Please prepare prescriptions     Additional Comment:    _______________________________________________ Alexander Mt, LCSWA 04/29/2018, 1:21 PM
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
## 2018-04-29 NOTE — Social Work (Signed)
Clinical Social Worker facilitated patient discharge including contacting patient family and facility to confirm patient discharge plans.  Clinical information faxed to facility and family agreeable with plan.  CSW arranged ambulance transport via [facility] to [place] RN to call [phone]  with report prior to discharge.  Clinical Social Worker will sign off for now as social work intervention is no longer needed. Please consult [name] again if new need arises.  Westley Hummer, MSW, [place] Social Worker [phone]

## 2018-04-29 NOTE — Plan of Care (Signed)
  Problem: Health Behavior/Discharge Planning: Goal: Ability to manage health-related needs will improve Outcome: Progressing   Problem: Clinical Measurements: Goal: Diagnostic test results will improve Outcome: Progressing Goal: Respiratory complications will improve Outcome: Progressing Goal: Cardiovascular complication will be avoided Outcome: Progressing

## 2018-05-05 ENCOUNTER — Encounter (HOSPITAL_COMMUNITY): Payer: Self-pay | Admitting: Orthopedic Surgery

## 2018-05-27 ENCOUNTER — Other Ambulatory Visit: Payer: Self-pay | Admitting: Registered Nurse

## 2018-05-27 DIAGNOSIS — G5712 Meralgia paresthetica, left lower limb: Secondary | ICD-10-CM

## 2018-05-27 DIAGNOSIS — M19172 Post-traumatic osteoarthritis, left ankle and foot: Secondary | ICD-10-CM

## 2018-05-27 DIAGNOSIS — M170 Bilateral primary osteoarthritis of knee: Secondary | ICD-10-CM

## 2018-06-18 ENCOUNTER — Encounter: Payer: Self-pay | Admitting: Registered Nurse

## 2018-06-18 ENCOUNTER — Other Ambulatory Visit: Payer: Self-pay

## 2018-06-18 ENCOUNTER — Encounter: Payer: Medicare Other | Attending: Registered Nurse | Admitting: Registered Nurse

## 2018-06-18 DIAGNOSIS — G5712 Meralgia paresthetica, left lower limb: Secondary | ICD-10-CM | POA: Diagnosis not present

## 2018-06-18 DIAGNOSIS — S066X3S Traumatic subarachnoid hemorrhage with loss of consciousness of 1 hour to 5 hours 59 minutes, sequela: Secondary | ICD-10-CM | POA: Diagnosis not present

## 2018-06-18 DIAGNOSIS — Z79891 Long term (current) use of opiate analgesic: Secondary | ICD-10-CM

## 2018-06-18 DIAGNOSIS — Z5181 Encounter for therapeutic drug level monitoring: Secondary | ICD-10-CM

## 2018-06-18 DIAGNOSIS — S8412XS Injury of peroneal nerve at lower leg level, left leg, sequela: Secondary | ICD-10-CM | POA: Diagnosis not present

## 2018-06-18 DIAGNOSIS — G894 Chronic pain syndrome: Secondary | ICD-10-CM | POA: Diagnosis not present
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
## 2018-07-16 ENCOUNTER — Other Ambulatory Visit: Payer: Self-pay | Admitting: Physical Medicine & Rehabilitation

## 2018-07-16 DIAGNOSIS — S8412XS Injury of peroneal nerve at lower leg level, left leg, sequela: Secondary | ICD-10-CM

## 2018-07-27 ENCOUNTER — Telehealth: Payer: Self-pay | Admitting: *Deleted

## 2018-07-27 NOTE — Progress Notes (Signed)
SPOKE W/  _  patient      SCREENING SYMPTOMS OF COVID 19:   COUGH--n  RUNNY NOSE--- a little runny nose the last couple, report no discolored discharge  SORE THROAT---n  NASAL CONGESTION----n  SNEEZING----n  SHORTNESS OF BREATH---n  DIFFICULTY BREATHING---n  TEMP >100.0 -----n  UNEXPLAINED BODY ACHES------n  CHILLS -------- n  HEADACHES ---------n  LOSS OF SMELL/ TASTE --------n    HAVE YOU OR ANY FAMILY MEMBER TRAVELLED PAST 14 DAYS OUT OF THE   COUNTY---no STATE----no COUNTRY----no  HAVE YOU OR ANY FAMILY MEMBER BEEN EXPOSED TO ANYONE WITH COVID 19?   "I was around my grandson, he's 82 yo around feb 20th and he come down with it then. They said he had it but they never tested him for it. He is better now and I haven't been around him for 6 weeks"

## 2018-07-27 NOTE — Patient Instructions (Addendum)
YOU ARE REQUIRED TO BE TESTED FOR COVID-19 PRIOR TO YOUR SURGERY . YOUR TEST MUST BE COMPLETED ON Friday Jul 30, 2018. TESTING IS LOCATED AT [place] ENTRANCE FROM 9:00AM - 3:00PM. FAILURE TO COMPLETE TESTING MAY RESULT IN CANCELLATION OF YOUR SURGERY.                   Katherine [name]   Your procedure is scheduled on: 08-03-2018   Report to [facility] Main  Entrance     Report to admitting at 8:00am      Call this number if you have problems the morning of surgery [phone]      Remember: Do not eat food After Midnight. YOU MAY HAVE CLEAR LIQUIDS FROM MIDNIGHT UNTIL 4:30AM. At 4:30AM Please finish the prescribed Pre-Surgery Gatorade drink. Nothing by mouth after you finish the Gatorade drink !     CLEAR LIQUID DIET   Foods Allowed                                                                     Foods Excluded  Coffee and tea, regular and decaf                             liquids that you cannot  Plain Jell-O in any flavor                                             see through such as: Fruit ices (not with fruit pulp)                                     milk, soups, orange juice  Iced Popsicles                                    All solid food Carbonated beverages, regular and diet                                    Cranberry, grape and apple juices Sports drinks like Gatorade Lightly seasoned clear broth or consume(fat free) Sugar, honey syrup  Sample Menu Breakfast                                Lunch                                     Supper Cranberry juice                    Beef broth                            Chicken broth Jell-O  Grape juice                           Apple juice Coffee or tea                        Jell-O                                      Popsicle                                                Coffee or tea                        Coffee or  tea  _____________________________________________________________________     BRUSH YOUR TEETH MORNING OF SURGERY AND RINSE YOUR MOUTH OUT, NO CHEWING GUM CANDY OR MINTS.     Take these medicines the morning of surgery with A SIP OF WATER: LEVOTHYROXINE, NEBIVOLOL, GABAPENTIN, PANTOPRAZOLE, CLONAZEPMA IF NEEDED, OXYCODONE IF NEEDED, RIZATRIPTAN IF NEEDED,  EYE DROPS IF NEEDED    DIABETES MANAGEMENT FOR SURGERY THE NIGHT BEFORE SURGERY , ONLY TAKE HALF DOSE OF [name] DIABETES MEDICATION. DO NOT TAKE ANY DIABETES MEDICATION THE DAY OF SURGERY! PLEASE CHECK YOUR BLOOD SUGAR THE DAY OF SURGERY. REPORT TO YOUR NURSE ON ARRIVAL.  IF YOUR BLOOD SUGAR IS LESS THAN 70 THE MORNING SURGERY WHEN YOU CHECK IT AT HOME, YOU NEED TO CALL THE [phone] FOR FURTHER INSTRUCTIONS!                                  You may not have any metal on your body including hair pins and              piercings  Do not wear jewelry, make-up, lotions, powders or perfumes, deodorant             Do not wear nail polish.  Do not shave  48 hours prior to surgery.          Do not bring valuables to the hospital. [place].  Contacts, dentures or bridgework may not be worn into surgery.       Patients discharged the day of surgery will not be allowed to drive home. IF YOU ARE HAVING SURGERY AND GOING HOME THE SAME DAY, YOU MUST HAVE AN ADULT TO DRIVE YOU HOME AND BE WITH YOU FOR 24 HOURS. YOU MAY GO HOME BY TAXI OR UBER OR ORTHERWISE, BUT AN ADULT MUST ACCOMPANY YOU HOME AND STAY WITH YOU FOR 24 HOURS.  Name and phone number of your driver:  Special Instructions: N/A              Please read over the following fact sheets you were given: _____________________________________________________________________             [facility] - Preparing for Surgery Before surgery, you can play an important role.  Because skin is not sterile, your skin needs to  be as free of germs as possible.  You  can reduce the number of germs on your skin by washing with CHG (chlorahexidine gluconate) soap before surgery.  CHG is an antiseptic cleaner which kills germs and bonds with the skin to continue killing germs even after washing. Please DO NOT use if you have an allergy to CHG or antibacterial soaps.  If your skin becomes reddened/irritated stop using the CHG and inform your nurse when you arrive at Short Stay. Do not shave (including legs and underarms) for at least 48 hours prior to the first CHG shower.  You may shave your face/neck. Please follow these instructions carefully:  1.  Shower with CHG Soap the night before surgery and the  morning of Surgery.  2.  If you choose to wash your hair, wash your hair first as usual with your  normal  shampoo.  3.  After you shampoo, rinse your hair and body thoroughly to remove the  shampoo.                           4.  Use CHG as you would any other liquid soap.  You can apply chg directly  to the skin and wash                       Gently with a scrungie or clean washcloth.  5.  Apply the CHG Soap to your body ONLY FROM THE NECK DOWN.   Do not use on face/ open                           Wound or open sores. Avoid contact with eyes, ears mouth and genitals (private parts).                       Wash face,  Genitals (private parts) with your normal soap.             6.  Wash thoroughly, paying special attention to the area where your surgery  will be performed.  7.  Thoroughly rinse your body with warm water from the neck down.  8.  DO NOT shower/wash with your normal soap after using and rinsing off  the CHG Soap.                9.  Pat yourself dry with a clean towel.            10.  Wear clean pajamas.            11.  Place clean sheets on your bed the night of your first shower and do not  sleep with pets. Day of Surgery : Do not apply any lotions/deodorants the morning of surgery.  Please wear clean clothes to  the hospital/surgery center.  FAILURE TO FOLLOW THESE INSTRUCTIONS MAY RESULT IN THE CANCELLATION OF YOUR SURGERY PATIENT SIGNATURE_________________________________  NURSE SIGNATURE__________________________________  ________________________________________________________________________   Adam Phenix  An incentive spirometer is a tool that can help keep your lungs clear and active. This tool measures how well you are filling your lungs with each breath. Taking long deep breaths may help reverse or decrease the chance of developing breathing (pulmonary) problems (especially infection) following:  A long period of time when you are unable to move or be active. BEFORE THE PROCEDURE   If the spirometer includes an indicator to show your best effort, your nurse or respiratory therapist will set it  to a desired goal.  If possible, sit up straight or lean slightly forward. Try not to slouch.  Hold the incentive spirometer in an upright position. INSTRUCTIONS FOR USE  1. Sit on the edge of your bed if possible, or sit up as far as you can in bed or on a chair. 2. Hold the incentive spirometer in an upright position. 3. Breathe out normally. 4. Place the mouthpiece in your mouth and seal your lips tightly around it. 5. Breathe in slowly and as deeply as possible, raising the piston or the ball toward the top of the column. 6. Hold your breath for 3-5 seconds or for as long as possible. Allow the piston or ball to fall to the bottom of the column. 7. Remove the mouthpiece from your mouth and breathe out normally. 8. Rest for a few seconds and repeat Steps 1 through 7 at least 10 times every 1-2 hours when you are awake. Take your time and take a few normal breaths between deep breaths. 9. The spirometer may include an indicator to show your best effort. Use the indicator as a goal to work toward during each repetition. 10. After each set of 10 deep breaths, practice coughing to be  sure your lungs are clear. If you have an incision (the cut made at the time of surgery), support your incision when coughing by placing a pillow or rolled up towels firmly against it. Once you are able to get out of bed, walk around indoors and cough well. You may stop using the incentive spirometer when instructed by your caregiver.  RISKS AND COMPLICATIONS  Take your time so you do not get dizzy or light-headed.  If you are in pain, you may need to take or ask for pain medication before doing incentive spirometry. It is harder to take a deep breath if you are having pain. AFTER USE  Rest and breathe slowly and easily.  It can be helpful to keep track of a log of your progress. Your caregiver can provide you with a simple table to help with this. If you are using the spirometer at home, follow these instructions: [place] IF:   You are having difficultly using the spirometer.  You have trouble using the spirometer as often as instructed.  Your pain medication is not giving enough relief while using the spirometer.  You develop fever of 100.5 F (38.1 C) or higher. SEEK IMMEDIATE MEDICAL CARE IF:   You cough up bloody sputum that had not been present before.  You develop fever of 102 F (38.9 C) or greater.  You develop worsening pain at or near the incision site. MAKE SURE YOU:   Understand these instructions.  Will watch your condition.  Will get help right away if you are not doing well or get worse. Document Released: 06/30/2006 Document Revised: 05/12/2011 Document Reviewed: 08/31/2006 [facility] Patient Information 2014 [place], [place].   ________________________________________________________________________

## 2018-07-27 NOTE — Telephone Encounter (Signed)
Katherine [name] has had surgery and will be having more in the near future(08/03/18) after her fall. She is going to be out of pain meds in 5 days and uncertain who should refill her medications. She was in [facility] and they were prescribing while there.  When discharged she was given left over which now she has 4 days left (oxycodone 10 mg).  I will send message to [name] and she will address tomorrow.  The [place] narcotic is not on the PMP and the last Rx is [facility] on 03/30/18.  ([name] says Dr Naaman Plummer gave her meds in February but she never filled them because of the fall) [facility] please call her.

## 2018-07-27 NOTE — Progress Notes (Addendum)
LOV PCP 06-18-2018 Epic  LOV NEURO DR WILLIS 09-29-17 Epic  EKG 10-01-17 Epic  CXR 04-22-2018 Epic  ECHO 2014    Pre-op appt  Patient with hx sig for htn; chf , diabetes 2, osa no device n use, ckd.  patient mildly sob today but relates this to sedentary lifestyle and weight and still recovering from injuries as a result of a fall after tripping on steps . 02 sat 95 on room air. Denies chest pain nor any other acute cardiac symptoms.  Had cardiac cath in 2006 ; reports this was clean. Last cardiology visit was "several years ago with Dr Gerrit Friends". Patient states " I only see him if I need to and I haven't needed to ". Echo indication in 2014 was CHF, pulmonary hypertension. patient denies awareness of the pulmonary htn. Denies difficulty breathing. unable to use her right hand due to injury and endorses she becomes fatigues easily due to the increased difficulty in doing ADLs now.

## 2018-07-28 ENCOUNTER — Encounter (HOSPITAL_COMMUNITY)
Admission: RE | Admit: 2018-07-28 | Discharge: 2018-07-28 | Disposition: A | Discharge status: Home / Self Care | Payer: Medicare Other | Source: Ambulatory Visit | Attending: Orthopedic Surgery | Admitting: Orthopedic Surgery

## 2018-07-28 ENCOUNTER — Other Ambulatory Visit (HOSPITAL_COMMUNITY)
Admission: RE | Admit: 2018-07-28 | Discharge: 2018-07-28 | Disposition: A | Discharge status: Home / Self Care | Payer: Medicare Other | Source: Ambulatory Visit | Attending: Orthopedic Surgery | Admitting: Orthopedic Surgery

## 2018-07-28 ENCOUNTER — Telehealth: Payer: Self-pay

## 2018-07-28 ENCOUNTER — Other Ambulatory Visit: Payer: Self-pay

## 2018-07-28 ENCOUNTER — Encounter (HOSPITAL_COMMUNITY): Payer: Self-pay

## 2018-07-28 DIAGNOSIS — Z01812 Encounter for preprocedural laboratory examination: Secondary | ICD-10-CM | POA: Diagnosis not present

## 2018-07-28 DIAGNOSIS — Z1159 Encounter for screening for other viral diseases: Secondary | ICD-10-CM | POA: Insufficient documentation

## 2018-07-28 HISTORY — DX: Meralgia paresthetica, left lower limb: G57.12

## 2018-07-28 HISTORY — DX: Injury of peroneal nerve at lower leg level, left leg, initial encounter: S84.12XA

## 2018-07-28 HISTORY — DX: Esophageal varices without bleeding: I85.00

## 2018-07-28 HISTORY — DX: Pulmonary hypertension, unspecified: I27.20

## 2018-07-28 HISTORY — DX: Chronic kidney disease, unspecified: N18.9

## 2018-07-28 HISTORY — DX: Unspecified cirrhosis of liver: K74.60

## 2018-07-28 LAB — CBC
HCT: 43.2 % (ref 36.0–46.0)
Hemoglobin: 12.9 g/dL (ref 12.0–15.0)
MCH: 25.3 pg — ABNORMAL LOW (ref 26.0–34.0)
MCHC: 29.9 g/dL — ABNORMAL LOW (ref 30.0–36.0)
MCV: 84.7 fL (ref 80.0–100.0)
Platelets: 111 10*3/uL — ABNORMAL LOW (ref 150–400)
RBC: 5.1 MIL/uL (ref 3.87–5.11)
RDW: 13.9 % (ref 11.5–15.5)
WBC: 4.3 10*3/uL (ref 4.0–10.5)
nRBC: 0 % (ref 0.0–0.2)

## 2018-07-28 LAB — COMPREHENSIVE METABOLIC PANEL
ALT: 20 U/L (ref 0–44)
AST: 33 U/L (ref 15–41)
Albumin: 3 g/dL — ABNORMAL LOW (ref 3.5–5.0)
Alkaline Phosphatase: 253 U/L — ABNORMAL HIGH (ref 38–126)
Anion gap: 6 (ref 5–15)
BUN: 23 mg/dL (ref 8–23)
CO2: 32 mmol/L (ref 22–32)
Calcium: 8.9 mg/dL (ref 8.9–10.3)
Chloride: 102 mmol/L (ref 98–111)
Creatinine, Ser: 0.97 mg/dL (ref 0.44–1.00)
GFR calc Af Amer: >60 mL/min (ref >60)
GFR calc non Af Amer: >60 mL/min (ref >60)
Glucose, Bld: 135 mg/dL — ABNORMAL HIGH (ref 70–99)
Potassium: 3.6 mmol/L (ref 3.5–5.1)
Sodium: 140 mmol/L (ref 135–145)
Total Bilirubin: 0.4 mg/dL (ref 0.3–1.2)
Total Protein: 7.2 g/dL (ref 6.5–8.1)

## 2018-07-28 LAB — HEMOGLOBIN A1C
Hgb A1c MFr Bld: 6.5 % — ABNORMAL HIGH (ref 4.8–5.6)
Mean Plasma Glucose: 139.85 mg/dL

## 2018-07-28 LAB — GLUCOSE, CAPILLARY: Glucose-Capillary: 151 mg/dL — ABNORMAL HIGH (ref 70–99)

## 2018-07-28 NOTE — Telephone Encounter (Signed)
Patient called stating she was returning [place] call

## 2018-07-28 NOTE — Telephone Encounter (Signed)
Return Ms. Shaler call, no answer. Left message to return the call.

## 2018-07-28 NOTE — Telephone Encounter (Signed)
Return Ms. Economos call, no answer. Left message to return the call.

## 2018-07-29 ENCOUNTER — Telehealth: Payer: Self-pay | Admitting: Registered Nurse

## 2018-07-29 DIAGNOSIS — S8412XS Injury of peroneal nerve at lower leg level, left leg, sequela: Secondary | ICD-10-CM

## 2018-07-29 DIAGNOSIS — G5712 Meralgia paresthetica, left lower limb: Secondary | ICD-10-CM

## 2018-07-29 LAB — NOVEL CORONAVIRUS, NAA (HOSP ORDER, SEND-OUT TO REF LAB; TAT 18-24 HRS): SARS-CoV-2, NAA: NOT DETECTED

## 2018-07-29 MED ORDER — OXYCODONE HCL 10 MG PO TABS: 10.0000 mg | ORAL_TABLET | Freq: Three times a day (TID) | ORAL | 0 refills | Status: DC | PRN

## 2018-07-29 NOTE — Telephone Encounter (Signed)
[place] Ms. Manring call, she called for a refill on her oxycodone. Ms. Badertscher was hospitalized at [facility] on 04/22/2018 and discharged on 04/29/2018 for:  Closed comminuted intertrochanteric fracture of proximal femur, left, and comminuted fracture of right humerus -Ortho consulting, underwent staged surgical intervention with the left femur repaired 2/21 with intramedullary nail, and then underwent ORIF of R prox humerus 2/22. She was discharged [place]. The Nursing Home doesn't have to report to the PMP. [facility] was called and previous prescription was removed from her profile. New Oxycodone prescription sent today. Ms. Twersky is scheduled for Right Hardware removal by Dr. Mardelle Matte on 08/03/2018, Ms. Zima states she's scheduled for outpatient procedure.

## 2018-08-02 MED ORDER — DEXTROSE 5 % IV SOLN
3.0000 g | INTRAVENOUS | Status: AC
  Administered 2018-08-03: 11:00:00 3 g via INTRAVENOUS
  Filled 2018-08-02: qty 3

## 2018-08-02 NOTE — Progress Notes (Signed)
SPOKE W/  _ Katherine [name]    SCREENING SYMPTOMS OF COVID 19:   COUGH--no  RUNNY NOSE--- no  SORE THROAT---no  NASAL CONGESTION----no  SNEEZING----no  SHORTNESS OF BREATH---no  DIFFICULTY BREATHING---no  TEMP >100.0 -----no  UNEXPLAINED BODY ACHES------no  CHILLS -------- no  HEADACHES ---------no  LOSS OF SMELL/ TASTE --------no    HAVE YOU OR ANY FAMILY MEMBER TRAVELLED PAST 14 DAYS OUT OF THE   COUNTY---no STATE----no COUNTRY----no  HAVE YOU OR ANY FAMILY MEMBER BEEN EXPOSED TO ANYONE WITH COVID 19? no

## 2018-08-03 ENCOUNTER — Ambulatory Visit (HOSPITAL_COMMUNITY)
Admission: RE | Admit: 2018-08-03 | Discharge: 2018-08-03 | Disposition: A | Discharge status: Home / Self Care | Payer: Medicare Other | Attending: Orthopedic Surgery | Admitting: Orthopedic Surgery

## 2018-08-03 ENCOUNTER — Ambulatory Visit (HOSPITAL_COMMUNITY): Payer: Medicare Other

## 2018-08-03 ENCOUNTER — Ambulatory Visit (HOSPITAL_COMMUNITY): Payer: Medicare Other | Admitting: Physician Assistant

## 2018-08-03 ENCOUNTER — Encounter (HOSPITAL_COMMUNITY)
Admission: RE | Disposition: A | Discharge status: Home / Self Care | Payer: Self-pay | Source: Home / Self Care | Attending: Orthopedic Surgery

## 2018-08-03 ENCOUNTER — Other Ambulatory Visit: Payer: Self-pay

## 2018-08-03 ENCOUNTER — Encounter (HOSPITAL_COMMUNITY): Payer: Self-pay | Admitting: Emergency Medicine

## 2018-08-03 ENCOUNTER — Ambulatory Visit (HOSPITAL_COMMUNITY): Payer: Medicare Other | Admitting: Anesthesiology

## 2018-08-03 DIAGNOSIS — E1122 Type 2 diabetes mellitus with diabetic chronic kidney disease: Secondary | ICD-10-CM | POA: Diagnosis not present

## 2018-08-03 DIAGNOSIS — F329 Major depressive disorder, single episode, unspecified: Secondary | ICD-10-CM | POA: Diagnosis not present

## 2018-08-03 DIAGNOSIS — Z87891 Personal history of nicotine dependence: Secondary | ICD-10-CM | POA: Insufficient documentation

## 2018-08-03 DIAGNOSIS — T85698A Other mechanical complication of other specified internal prosthetic devices, implants and grafts, initial encounter: Secondary | ICD-10-CM | POA: Diagnosis present

## 2018-08-03 DIAGNOSIS — I272 Pulmonary hypertension, unspecified: Secondary | ICD-10-CM | POA: Diagnosis not present

## 2018-08-03 DIAGNOSIS — T84498A Other mechanical complication of other internal orthopedic devices, implants and grafts, initial encounter: Secondary | ICD-10-CM

## 2018-08-03 DIAGNOSIS — F419 Anxiety disorder, unspecified: Secondary | ICD-10-CM | POA: Insufficient documentation

## 2018-08-03 DIAGNOSIS — Z7982 Long term (current) use of aspirin: Secondary | ICD-10-CM | POA: Insufficient documentation

## 2018-08-03 DIAGNOSIS — Z885 Allergy status to narcotic agent status: Secondary | ICD-10-CM | POA: Insufficient documentation

## 2018-08-03 DIAGNOSIS — T8484XA Pain due to internal orthopedic prosthetic devices, implants and grafts, initial encounter: Secondary | ICD-10-CM | POA: Diagnosis not present

## 2018-08-03 DIAGNOSIS — S8412XS Injury of peroneal nerve at lower leg level, left leg, sequela: Secondary | ICD-10-CM

## 2018-08-03 DIAGNOSIS — K219 Gastro-esophageal reflux disease without esophagitis: Secondary | ICD-10-CM | POA: Diagnosis not present

## 2018-08-03 DIAGNOSIS — Z79899 Other long term (current) drug therapy: Secondary | ICD-10-CM | POA: Diagnosis not present

## 2018-08-03 DIAGNOSIS — Z6841 Body Mass Index (BMI) 40.0 and over, adult: Secondary | ICD-10-CM | POA: Diagnosis not present

## 2018-08-03 DIAGNOSIS — Z794 Long term (current) use of insulin: Secondary | ICD-10-CM | POA: Diagnosis not present

## 2018-08-03 DIAGNOSIS — Z888 Allergy status to other drugs, medicaments and biological substances status: Secondary | ICD-10-CM | POA: Insufficient documentation

## 2018-08-03 DIAGNOSIS — G4730 Sleep apnea, unspecified: Secondary | ICD-10-CM | POA: Diagnosis not present

## 2018-08-03 DIAGNOSIS — Z419 Encounter for procedure for purposes other than remedying health state, unspecified: Secondary | ICD-10-CM

## 2018-08-03 DIAGNOSIS — E785 Hyperlipidemia, unspecified: Secondary | ICD-10-CM | POA: Diagnosis not present

## 2018-08-03 DIAGNOSIS — N189 Chronic kidney disease, unspecified: Secondary | ICD-10-CM | POA: Insufficient documentation

## 2018-08-03 DIAGNOSIS — I129 Hypertensive chronic kidney disease with stage 1 through stage 4 chronic kidney disease, or unspecified chronic kidney disease: Secondary | ICD-10-CM | POA: Insufficient documentation

## 2018-08-03 DIAGNOSIS — G5712 Meralgia paresthetica, left lower limb: Secondary | ICD-10-CM

## 2018-08-03 DIAGNOSIS — Z8614 Personal history of Methicillin resistant Staphylococcus aureus infection: Secondary | ICD-10-CM | POA: Diagnosis not present

## 2018-08-03 DIAGNOSIS — Y793 Surgical instruments, materials and orthopedic devices (including sutures) associated with adverse incidents: Secondary | ICD-10-CM | POA: Diagnosis not present

## 2018-08-03 HISTORY — PX: HARDWARE REMOVAL: SHX979

## 2018-08-03 HISTORY — DX: Other mechanical complication of other internal orthopedic devices, implants and grafts, initial encounter: T84.498A

## 2018-08-03 LAB — GLUCOSE, CAPILLARY
Glucose-Capillary: 93 mg/dL (ref 70–99)
Glucose-Capillary: 86 mg/dL (ref 70–99)

## 2018-08-03 SURGERY — REMOVAL, HARDWARE
Anesthesia: General | Site: Shoulder | Laterality: Right

## 2018-08-03 MED ORDER — BUPIVACAINE-EPINEPHRINE (PF) 0.5% -1:200000 IJ SOLN
INTRAMUSCULAR | Status: DC | PRN
  Administered 2018-08-03 (×5): 3 mL via PERINEURAL

## 2018-08-03 MED ORDER — PROPOFOL 10 MG/ML IV BOLUS
INTRAVENOUS | Status: AC
  Filled 2018-08-03: qty 20

## 2018-08-03 MED ORDER — ROCURONIUM BROMIDE 10 MG/ML (PF) SYRINGE
PREFILLED_SYRINGE | INTRAVENOUS | Status: AC
  Filled 2018-08-03: qty 10

## 2018-08-03 MED ORDER — FENTANYL CITRATE (PF) 100 MCG/2ML IJ SOLN
50.0000 ug | INTRAMUSCULAR | Status: DC
  Administered 2018-08-03: 10:00:00 100 ug via INTRAVENOUS
  Filled 2018-08-03: qty 2

## 2018-08-03 MED ORDER — SUCCINYLCHOLINE CHLORIDE 200 MG/10ML IV SOSY
PREFILLED_SYRINGE | INTRAVENOUS | Status: AC
  Filled 2018-08-03: qty 10

## 2018-08-03 MED ORDER — DEXAMETHASONE SODIUM PHOSPHATE 10 MG/ML IJ SOLN
INTRAMUSCULAR | Status: DC | PRN
  Administered 2018-08-03: 5 mg via INTRAVENOUS

## 2018-08-03 MED ORDER — MEPERIDINE HCL 50 MG/ML IJ SOLN: 6.2500 mg | INTRAMUSCULAR | Status: DC | PRN

## 2018-08-03 MED ORDER — SUGAMMADEX SODIUM 200 MG/2ML IV SOLN
INTRAVENOUS | Status: DC | PRN
  Administered 2018-08-03: 500 mg via INTRAVENOUS

## 2018-08-03 MED ORDER — SODIUM CHLORIDE 0.9 % IV SOLN
INTRAVENOUS | Status: DC | PRN
  Administered 2018-08-03: 25 ug/min via INTRAVENOUS

## 2018-08-03 MED ORDER — LIDOCAINE 2% (20 MG/ML) 5 ML SYRINGE
INTRAMUSCULAR | Status: DC | PRN
  Administered 2018-08-03: 80 mg via INTRAVENOUS

## 2018-08-03 MED ORDER — ACETAMINOPHEN 325 MG PO TABS: 325.0000 mg | ORAL_TABLET | ORAL | Status: DC | PRN

## 2018-08-03 MED ORDER — SUCCINYLCHOLINE CHLORIDE 200 MG/10ML IV SOSY
PREFILLED_SYRINGE | INTRAVENOUS | Status: DC | PRN
  Administered 2018-08-03: 140 mg via INTRAVENOUS

## 2018-08-03 MED ORDER — MIDAZOLAM HCL 5 MG/5ML IJ SOLN
INTRAMUSCULAR | Status: DC | PRN
  Administered 2018-08-03: 2 mg via INTRAVENOUS

## 2018-08-03 MED ORDER — ACETAMINOPHEN 160 MG/5ML PO SOLN: 325.0000 mg | ORAL | Status: DC | PRN

## 2018-08-03 MED ORDER — FENTANYL CITRATE (PF) 100 MCG/2ML IJ SOLN: 25.0000 ug | INTRAMUSCULAR | Status: DC | PRN

## 2018-08-03 MED ORDER — FENTANYL CITRATE (PF) 250 MCG/5ML IJ SOLN
INTRAMUSCULAR | Status: DC | PRN
  Administered 2018-08-03 (×2): 50 ug via INTRAVENOUS

## 2018-08-03 MED ORDER — CHLORHEXIDINE GLUCONATE 4 % EX LIQD: 60.0000 mL | Freq: Once | CUTANEOUS | Status: DC

## 2018-08-03 MED ORDER — PROPOFOL 10 MG/ML IV BOLUS
INTRAVENOUS | Status: DC | PRN
  Administered 2018-08-03: 140 mg via INTRAVENOUS

## 2018-08-03 MED ORDER — BUPIVACAINE LIPOSOME 1.3 % IJ SUSP
INTRAMUSCULAR | Status: DC | PRN
  Administered 2018-08-03 (×5): 2 mL via PERINEURAL

## 2018-08-03 MED ORDER — SODIUM CHLORIDE 0.9 % IR SOLN
Status: DC | PRN
  Administered 2018-08-03: 1000 mL

## 2018-08-03 MED ORDER — KETOROLAC TROMETHAMINE 30 MG/ML IJ SOLN: 30.0000 mg | Freq: Once | INTRAMUSCULAR | Status: DC | PRN

## 2018-08-03 MED ORDER — DEXAMETHASONE SODIUM PHOSPHATE 10 MG/ML IJ SOLN
INTRAMUSCULAR | Status: AC
  Filled 2018-08-03: qty 1

## 2018-08-03 MED ORDER — LACTATED RINGERS IV SOLN
INTRAVENOUS | Status: DC
  Administered 2018-08-03: 09:00:00 via INTRAVENOUS

## 2018-08-03 MED ORDER — MIDAZOLAM HCL 2 MG/2ML IJ SOLN
INTRAMUSCULAR | Status: AC
  Filled 2018-08-03: qty 2

## 2018-08-03 MED ORDER — ACETAMINOPHEN 500 MG PO TABS
1000.0000 mg | ORAL_TABLET | Freq: Once | ORAL | Status: AC
  Administered 2018-08-03: 1000 mg via ORAL
  Filled 2018-08-03: qty 2

## 2018-08-03 MED ORDER — HYDROMORPHONE HCL 2 MG PO TABS: 2.0000 mg | ORAL_TABLET | ORAL | 0 refills | Status: DC | PRN

## 2018-08-03 MED ORDER — ONDANSETRON HCL 4 MG/2ML IJ SOLN
INTRAMUSCULAR | Status: AC
  Filled 2018-08-03: qty 2

## 2018-08-03 MED ORDER — ONDANSETRON HCL 4 MG/2ML IJ SOLN
INTRAMUSCULAR | Status: DC | PRN
  Administered 2018-08-03: 4 mg via INTRAVENOUS

## 2018-08-03 MED ORDER — LIDOCAINE 2% (20 MG/ML) 5 ML SYRINGE
INTRAMUSCULAR | Status: AC
  Filled 2018-08-03: qty 5

## 2018-08-03 MED ORDER — FENTANYL CITRATE (PF) 100 MCG/2ML IJ SOLN
INTRAMUSCULAR | Status: AC
  Filled 2018-08-03: qty 2

## 2018-08-03 MED ORDER — TRANEXAMIC ACID-NACL 1000-0.7 MG/100ML-% IV SOLN
1000.0000 mg | INTRAVENOUS | Status: AC
  Administered 2018-08-03: 11:00:00 1000 mg via INTRAVENOUS
  Filled 2018-08-03: qty 100

## 2018-08-03 MED ORDER — EPHEDRINE SULFATE-NACL 50-0.9 MG/10ML-% IV SOSY
PREFILLED_SYRINGE | INTRAVENOUS | Status: DC | PRN
  Administered 2018-08-03: 10 mg via INTRAVENOUS

## 2018-08-03 MED ORDER — ROCURONIUM BROMIDE 10 MG/ML (PF) SYRINGE
PREFILLED_SYRINGE | INTRAVENOUS | Status: DC | PRN
  Administered 2018-08-03: 50 mg via INTRAVENOUS

## 2018-08-03 MED ORDER — ONDANSETRON HCL 4 MG/2ML IJ SOLN: 4.0000 mg | Freq: Once | INTRAMUSCULAR | Status: DC | PRN

## 2018-08-03 MED ORDER — MIDAZOLAM HCL 2 MG/2ML IJ SOLN
1.0000 mg | INTRAMUSCULAR | Status: DC
  Administered 2018-08-03: 2 mg via INTRAVENOUS
  Filled 2018-08-03: qty 2

## 2018-08-03 SURGICAL SUPPLY — 45 items
APL SKNCLS STERI-STRIP NONHPOA (GAUZE/BANDAGES/DRESSINGS)
BENZOIN TINCTURE PRP APPL 2/3 (GAUZE/BANDAGES/DRESSINGS) ×2 IMPLANT
BNDG GAUZE ELAST 4 BULKY (GAUZE/BANDAGES/DRESSINGS) ×4 IMPLANT
COVER SURGICAL LIGHT HANDLE (MISCELLANEOUS) ×2 IMPLANT
COVER WAND RF STERILE (DRAPES) IMPLANT
DRAPE C-ARM 42X120 X-RAY (DRAPES) ×2 IMPLANT
DRAPE INCISE IOBAN 66X45 STRL (DRAPES) IMPLANT
DRAPE SURG 17X11 SM STRL (DRAPES) ×4 IMPLANT
DRAPE U-SHAPE 47X51 STRL (DRAPES) ×2 IMPLANT
DRSG ADAPTIC 3X8 NADH LF (GAUZE/BANDAGES/DRESSINGS) ×2 IMPLANT
DRSG MEPILEX BORDER 4X8 (GAUZE/BANDAGES/DRESSINGS) ×1 IMPLANT
DRSG PAD ABDOMINAL 8X10 ST (GAUZE/BANDAGES/DRESSINGS) ×4 IMPLANT
ELECT REM PT RETURN 15FT ADLT (MISCELLANEOUS) ×2 IMPLANT
EVACUATOR 1/8 PVC DRAIN (DRAIN) IMPLANT
GAUZE SPONGE 4X4 12PLY STRL (GAUZE/BANDAGES/DRESSINGS) ×4 IMPLANT
GLOVE BIO SURGEON STRL SZ7.5 (GLOVE) ×2 IMPLANT
GLOVE BIOGEL PI IND STRL 8 (GLOVE) ×1 IMPLANT
GLOVE BIOGEL PI INDICATOR 8 (GLOVE) ×1
GOWN STRL REUS W/TWL LRG LVL3 (GOWN DISPOSABLE) ×4 IMPLANT
KIT BASIN OR (CUSTOM PROCEDURE TRAY) ×2 IMPLANT
KIT TURNOVER KIT A (KITS) IMPLANT
MANIFOLD NEPTUNE II (INSTRUMENTS) ×2 IMPLANT
NS IRRIG 1000ML POUR BTL (IV SOLUTION) ×2 IMPLANT
PACK ORTHO EXTREMITY (CUSTOM PROCEDURE TRAY) ×2 IMPLANT
PEG LOCKING 3.2MMX44 (Peg) ×2 IMPLANT
PROTECTOR NERVE ULNAR (MISCELLANEOUS) ×2 IMPLANT
SLING ARM IMMOBILIZER LRG (SOFTGOODS) ×2 IMPLANT
SLING ARM IMMOBILIZER MED (SOFTGOODS) IMPLANT
SOL PREP POV-IOD 4OZ 10% (MISCELLANEOUS) ×2 IMPLANT
SOL PREP PROV IODINE SCRUB 4OZ (MISCELLANEOUS) ×2 IMPLANT
SPONGE LAP 18X18 RF (DISPOSABLE) IMPLANT
STAPLER VISISTAT 35W (STAPLE) IMPLANT
STOCKINETTE 8 INCH (MISCELLANEOUS) ×2 IMPLANT
STRIP CLOSURE SKIN 1/2X4 (GAUZE/BANDAGES/DRESSINGS) ×1 IMPLANT
SUCTION FRAZIER HANDLE 10FR (MISCELLANEOUS) ×1
SUCTION TUBE FRAZIER 10FR DISP (MISCELLANEOUS) ×1 IMPLANT
SUT ETHIBOND 2 0 SH (SUTURE) ×2
SUT ETHIBOND 2 0 SH 36X2 (SUTURE) ×1 IMPLANT
SUT MNCRL AB 4-0 PS2 18 (SUTURE) ×2 IMPLANT
SUT VIC AB 0 CT1 36 (SUTURE) ×2 IMPLANT
SUT VIC AB 3-0 SH 18 (SUTURE) ×2 IMPLANT
SUT VIC AB 4-0 PS2 27 (SUTURE) ×2 IMPLANT
TOWEL OR 17X26 10 PK STRL BLUE (TOWEL DISPOSABLE) ×4 IMPLANT
TRAY FOLEY MTR SLVR 16FR STAT (SET/KITS/TRAYS/PACK) IMPLANT
WATER STERILE IRR 1000ML POUR (IV SOLUTION) ×2 IMPLANT

## 2018-08-03 NOTE — Progress Notes (Signed)
Notified Dr. Jillyn Hidden that pt did not take her bystolic this morning.  Pt last dose was 0900 yesterday. Vital signs given to Dr. Jillyn Hidden and per Dr. Jillyn Hidden no dose to be given prior to surgery.

## 2018-08-03 NOTE — Op Note (Signed)
08/03/2018  11:22 AM  PATIENT:  Katherine [name]    PRE-OPERATIVE DIAGNOSIS:  right shoulder failed hardware with penetration of 2 proximal humeral pegs  POST-OPERATIVE DIAGNOSIS:  Same  PROCEDURE: Right shoulder removal of proximal humeral pegs with replacement of shorter pegs  SURGEON:  Johnny Bridge, MD  PHYSICIAN ASSISTANT: Joya Gaskins, OPA-C, present and scrubbed throughout the case, critical for completion in a timely fashion, and for retraction, instrumentation, and closure.  ANESTHESIA:   General with regional block  PREOPERATIVE INDICATIONS:  Katherine [name] is a  63 y.o. female who broke her right shoulder and left femur 3 months ago, and had some early subsidence of the humeral head with penetration of the pegs.  She elected for revision surgery.  The risks benefits and alternatives were discussed with the patient preoperatively including but not limited to the risks of infection, bleeding, nerve injury, cardiopulmonary complications, the need for revision surgery, among others, and the patient was willing to proceed.  We also discussed the possibility that this could represent infection, it also could potentially progress to a nonunion or malunion, and require conversion to arthroplasty.  ESTIMATED BLOOD LOSS: 75 mL  OPERATIVE IMPLANTS: We removed 2 proximal smooth locking pegs and replaced them with shorter pegs, six shorter on each.  OPERATIVE FINDINGS: No evidence for purulence, I did take specimens for Gram stain culture and sensitivity, but did not look like clinical infection.  OPERATIVE PROCEDURE: The patient was brought to the operating room and placed in supine position.  General anesthesia was administered.  She was placed in a beachchair position.  Right upper extremity was prepped and draped in usual sterile fashion.  Timeout was performed.  Incision was carried out through her previous incision, and the deltopectoral interval explored, and the plate was  exposed.  The pegs that were long were identified using fluoroscopy, and removed.  I exchange them for a second set of pegs that were 6 mm shorter, and then examined the proximal humerus under live fluoroscopy.  It appeared to move as a unit, with no prominence of the hardware.  The wounds were irrigated copiously, and the deltopectoral interval repaired with Vicryl followed by Vicryl for the subcutaneous tissue with routine closure for the skin.  Sterile gauze was applied.  She was awakened and returned to the PACU in stable and satisfactory condition.

## 2018-08-03 NOTE — Anesthesia Preprocedure Evaluation (Addendum)
Anesthesia Evaluation  Patient identified by MRN, date of birth, ID band Patient awake    Reviewed: Allergy & Precautions, NPO status , Patient's Chart, lab work & pertinent test results  Airway Mallampati: II       Dental  (+) Missing, Poor Dentition,    Pulmonary former smoker,    Pulmonary exam normal breath sounds clear to auscultation       Cardiovascular hypertension, Pt. on medications Normal cardiovascular exam Rhythm:Regular Rate:Normal     Neuro/Psych PSYCHIATRIC DISORDERS Anxiety Depression    GI/Hepatic GERD  Medicated,  Endo/Other  diabetes, Type 2Hypothyroidism   Renal/GU      Musculoskeletal  (+) Arthritis , Osteoarthritis,    Abdominal (+) + obese,   Peds  Hematology   Anesthesia Other Findings   Reproductive/Obstetrics                            Anesthesia Physical Anesthesia Plan  ASA: III  Anesthesia Plan: General   Post-op Pain Management:  Regional for Post-op pain   Induction: Intravenous  PONV Risk Score and Plan: 3 and Ondansetron and Midazolam  Airway Management Planned: Oral ETT  Additional Equipment:   Intra-op Plan:   Post-operative Plan: Extubation in OR  Informed Consent: I have reviewed the patients History and Physical, chart, labs and discussed the procedure including the risks, benefits and alternatives for the proposed anesthesia with the patient or authorized representative who has indicated his/her understanding and acceptance.     Dental advisory given  Plan Discussed with: CRNA and Surgeon  Anesthesia Plan Comments:        Anesthesia Quick Evaluation

## 2018-08-03 NOTE — Progress Notes (Signed)
Assisted Dr. Jillyn Hidden with right, ultrasound guided, interscalene  block. Side rails up, monitors on throughout procedure. See vital signs in flow sheet. Tolerated Procedure well.
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
## 2018-08-03 NOTE — Transfer of Care (Signed)
Immediate Anesthesia Transfer of Care Note  Patient: Katherine [name]  Procedure(s) Performed: HARDWARE REMOVAL (Right Shoulder)  Patient Location: PACU  Anesthesia Type:General  Level of Consciousness: awake, alert , oriented and patient cooperative  Airway & Oxygen Therapy: Patient Spontanous Breathing and Patient connected to face mask oxygen  Post-op Assessment: Report given to RN, Post -op Vital signs reviewed and stable and Patient moving all extremities  Post vital signs: Reviewed and stable  Last Vitals:  Vitals Value Taken Time  BP 156/134 08/03/2018 11:36 AM  Temp    Pulse 79 08/03/2018 11:36 AM  Resp 15 08/03/2018 11:36 AM  SpO2 86 % 08/03/2018 11:36 AM  Vitals shown include unvalidated device data.  Last Pain:  Vitals:   08/03/18 0953  TempSrc:   PainSc: 0-No pain      Patients Stated Pain Goal: 4 (24/29/98 0699)  Complications: No apparent anesthesia complications

## 2018-08-03 NOTE — Discharge Instructions (Signed)
Diet: As you were doing prior to hospitalization   Shower:  May shower but keep the wounds dry, use an occlusive plastic wrap, NO SOAKING IN TUB.  If the bandage gets wet, change with a clean dry gauze.  If you have a splint on, leave the splint in place and keep the splint dry with a plastic bag.  Dressing:  You may change your dressing 3-5 days after surgery, unless you have a splint.  If you have a splint, then just leave the splint in place and we will change your bandages during your first follow-up appointment.    If you had hand or foot surgery, we will plan to remove your stitches in about 2 weeks in the office.  For all other surgeries, there are sticky tapes (steri-strips) on your wounds and all the stitches are absorbable.  Leave the steri-strips in place when changing your dressings, they will peel off with time, usually 2-3 weeks.  Activity:  Increase activity slowly as tolerated, but follow the weight bearing instructions below.  The rules on driving is that you can not be taking narcotics while you drive, and you must feel in control of the vehicle.    Weight Bearing:   As tolerated, ok to use shoulder when comfortable.    To prevent constipation: you may use a stool softener such as -  Colace (over the counter) 100 mg by mouth twice a day  Drink plenty of fluids (prune juice may be helpful) and high fiber foods Miralax (over the counter) for constipation as needed.    Itching:  If you experience itching with your medications, try taking only a single pain pill, or even half a pain pill at a time.  You may take up to 10 pain pills per day, and you can also use benadryl over the counter for itching or also to help with sleep.   Precautions:  If you experience chest pain or shortness of breath - call 911 immediately for transfer to the hospital emergency department!!  If you develop a fever greater that 101 F, purulent drainage from wound, increased redness or drainage from wound,  or calf pain -- Call the office at [phone]                                                Follow- Up Appointment:  Please call for an appointment to be seen in 2 weeks [place] - [phone]    Interscalene Nerve Block, Care After This sheet gives you information about how to care for yourself after your procedure. Your health care provider may also give you more specific instructions. If you have problems or questions, contact your health care provider. What can I expect after the procedure? After the procedure, it is common to have:  Soreness or tenderness in your neck.  Numbness in your shoulder, upper arm, and some fingers.  Weakness in your shoulder and arm muscles. The feeling and strength in your shoulder, arm, and fingers should return to normal within hours after your procedure. Follow these instructions at home: For at least 24 hours after the procedure:  Do not: ? Participate in activities in which you could fall or become injured. ? Drive. ? Use heavy machinery. ? Drink alcohol. ? Take sleeping pills or medicines that cause drowsiness. ? Make important decisions or sign legal documents. ?  Take care of children on your own.  Rest. Eating and drinking  If you vomit, drink water, juice, or soup when you can drink without vomiting.  Make sure you have little or no nausea before eating solid foods.  Follow the diet that is recommended by your health care provider. If you have a sling:  Wear it as told by your health care provider. Remove it only as told by your health care provider.  Loosen the sling if your fingers tingle, become numb, or turn cold and blue.  Make sure that your entire arm, including your wrist, is supported. Do not allow your wrist to dangle over the end of the sling.  Do not let your sling get wet if it is not waterproof.  Keep the sling clean. Bathing  Do not take baths, swim, or use a hot tub until your health care provider  approves.   Injection site care   Wash your hands with soap and water before you change your bandage (dressing). If soap and water are not available, use hand sanitizer.  Change your dressing as told by your health care provider.  Keep your dressing dry.  Check your nerve block injection site every day for signs of infection. Check for: ? Redness, swelling, or pain. ? Fluid or blood. ? Warmth. Activity  Do not perform complex or risky activities while taking prescription pain medicine and until you have fully recovered.  Return to your normal activities as told by your health care provider and as you can tolerate them. Ask your health care provider what activities are safe for you.  Rest and take it easy. This will help you heal and recover more quickly and fully.  Be very cautious until you have regained strength and sensation. General instructions  Have a responsible adult stay with you until you are awake and alert.  Do not drive or use heavy machinery while taking prescription pain medicine and until you have fully recovered. Ask your health care provider when it is safe to drive.  Take over-the-counter and prescription medicines only as told by your health care provider.  If you smoke, do not smoke without supervision.  Do not expose your arm or shoulder to very cold or very hot temperatures until you have full feeling back.  If you have a nerve block catheter in place: ? Try to keep the catheter from getting kinked or pinched. ? Avoid pulling or tugging on the catheter.  Keep all follow-up visits as told by your health care provider. This is important. Contact a health care provider if:  You have chills or fever.  You have redness, swelling, or pain around your injection site.  You have fluid or blood coming from the injection site.  The skin around the injection site is warm to the touch.  There is a bad smell coming from your dressing.  You have  hoarseness or a drooping or dry eye that lasts more than a few days.  You have pain that is poorly controlled with the block or with pain medicine.  You have numbness, tingling, or weakness in your shoulder or arm that lasts for more than one week. Get help right away if:  You have severe pain.  You lose or do not regain strength and sensation in your arm even after the nerve block medicine has stopped.  You have trouble breathing.  You have a nerve block catheter still in place and you begin to shiver.  You have  a nerve block catheter still in place and you are getting more and more numb or weak. This information is not intended to replace advice given to you by your health care provider. Make sure you discuss any questions you have with your health care provider. Document Released: 02/09/2015 Document Revised: 10/19/2015 Document Reviewed: 10/19/2015 Elsevier Interactive Patient Education  2019 [employer].

## 2018-08-03 NOTE — Anesthesia Procedure Notes (Signed)
Anesthesia Regional Block: Interscalene brachial plexus block   Pre-Anesthetic Checklist: ,, timeout performed, Correct Patient, Correct Site, Correct Laterality, Correct Procedure, Correct Position, site marked, Risks and benefits discussed,  Surgical consent,  Pre-op evaluation,  At surgeon's request and post-op pain management  Laterality: Right and Upper  Prep: chloraprep       Needles:  Injection technique: Single-shot  Needle Type: Echogenic Stimulator Needle     Needle Length: 10cm  Needle Gauge: 21   Needle insertion depth: 2 cm   Additional Needles:   Procedures:,,,, ultrasound used (permanent image in chart),,,,  Narrative:  Start time: 08/03/2018 9:35 AM End time: 08/03/2018 9:50 AM Injection made incrementally with aspirations every 5 mL.  Performed by: Personally  Anesthesiologist: Lyn Hollingshead, MD

## 2018-08-03 NOTE — Anesthesia Postprocedure Evaluation (Signed)
Anesthesia Post Note  Patient: Katherine [name]  Procedure(s) Performed: HARDWARE REMOVAL, ORIF REVISION (Right Shoulder)     Patient location during evaluation: PACU Anesthesia Type: General and Regional Level of consciousness: lethargic Pain management: pain level controlled Vital Signs Assessment: post-procedure vital signs reviewed and stable Respiratory status: patient connected to nasal cannula oxygen Cardiovascular status: stable Anesthetic complications: no    Last Vitals:  Vitals:   08/03/18 1315 08/03/18 1345  BP: (!) 117/55 104/68  Pulse: 64 66  Resp: 11 12  Temp:    SpO2: 95% (!) 88%    Last Pain:  Vitals:   08/03/18 1345  TempSrc:   PainSc: Asleep   Pain Goal: Patients Stated Pain Goal: 4 (08/03/18 0846)                 Huston Foley

## 2018-08-03 NOTE — Anesthesia Procedure Notes (Signed)
Procedure Name: Intubation Date/Time: 08/03/2018 10:26 AM Performed by: Mitzie Na, CRNA Pre-anesthesia Checklist: Patient identified, Emergency Drugs available, Suction available, Patient being monitored and Timeout performed Patient Re-evaluated:Patient Re-evaluated prior to induction Oxygen Delivery Method: Circle system utilized Preoxygenation: Pre-oxygenation with 100% oxygen Induction Type: IV induction and Rapid sequence Laryngoscope Size: Mac and 3 Grade View: Grade I Tube type: Oral Tube size: 7.0 mm Number of attempts: 1 Airway Equipment and Method: Stylet Placement Confirmation: ETT inserted through vocal cords under direct vision,  positive ETCO2 and breath sounds checked- equal and bilateral Secured at: 25 cm Tube secured with: Tape Dental Injury: Teeth and Oropharynx as per pre-operative assessment

## 2018-08-04 ENCOUNTER — Encounter (HOSPITAL_COMMUNITY): Payer: Self-pay | Admitting: Orthopedic Surgery

## 2018-08-04 ENCOUNTER — Telehealth: Payer: Self-pay

## 2018-08-04 NOTE — Telephone Encounter (Signed)
Pt returned call and stated she no longer needs the appt.

## 2018-08-04 NOTE — Telephone Encounter (Signed)
I contacted the pt and left a vm and requested a call back. Pt was advised in the vm our office needs to reschedule her 08/06/18 visit due to our office not being opened on Fridays right now. Pt was provided with [facility] #.

## 2018-08-04 NOTE — Telephone Encounter (Signed)
Noted. Thanks.

## 2018-08-06 ENCOUNTER — Ambulatory Visit: Payer: Medicare Other | Admitting: Neurology

## 2018-08-08 LAB — AEROBIC/ANAEROBIC CULTURE W GRAM STAIN (SURGICAL/DEEP WOUND): Culture: NO GROWTH

## 2018-08-20 ENCOUNTER — Encounter: Payer: Self-pay | Admitting: Registered Nurse

## 2018-08-20 ENCOUNTER — Other Ambulatory Visit: Payer: Self-pay

## 2018-08-20 ENCOUNTER — Encounter: Payer: Medicare Other | Attending: Registered Nurse | Admitting: Registered Nurse

## 2018-08-20 VITALS — BP 113/77 | HR 69 | Temp 97.7°F | Resp 14 | Ht 66.0 in | Wt 261.0 lb

## 2018-08-20 DIAGNOSIS — G894 Chronic pain syndrome: Secondary | ICD-10-CM

## 2018-08-20 DIAGNOSIS — G8929 Other chronic pain: Secondary | ICD-10-CM

## 2018-08-20 DIAGNOSIS — Z79891 Long term (current) use of opiate analgesic: Secondary | ICD-10-CM | POA: Diagnosis present

## 2018-08-20 DIAGNOSIS — G5712 Meralgia paresthetica, left lower limb: Secondary | ICD-10-CM

## 2018-08-20 DIAGNOSIS — M47816 Spondylosis without myelopathy or radiculopathy, lumbar region: Secondary | ICD-10-CM

## 2018-08-20 DIAGNOSIS — Z5181 Encounter for therapeutic drug level monitoring: Secondary | ICD-10-CM

## 2018-08-20 DIAGNOSIS — S8412XS Injury of peroneal nerve at lower leg level, left leg, sequela: Secondary | ICD-10-CM | POA: Diagnosis present

## 2018-08-20 DIAGNOSIS — M1711 Unilateral primary osteoarthritis, right knee: Secondary | ICD-10-CM

## 2018-08-20 DIAGNOSIS — S066X3S Traumatic subarachnoid hemorrhage with loss of consciousness of 1 hour to 5 hours 59 minutes, sequela: Secondary | ICD-10-CM

## 2018-08-20 DIAGNOSIS — M25511 Pain in right shoulder: Secondary | ICD-10-CM

## 2018-08-20 MED ORDER — OXYCODONE HCL 10 MG PO TABS: 10.0000 mg | ORAL_TABLET | Freq: Three times a day (TID) | ORAL | 0 refills | Status: DC | PRN
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
## 2018-08-26 LAB — DRUG TOX MONITOR 1 W/CONF, ORAL FLD
Amphetamines: NEGATIVE ng/mL (ref <10)
Barbiturates: NEGATIVE ng/mL (ref <10)
Benzodiazepines: NEGATIVE ng/mL (ref <0.50)
Buprenorphine: NEGATIVE ng/mL (ref <0.10)
Cocaine: NEGATIVE ng/mL (ref <5.0)
Codeine: NEGATIVE ng/mL (ref <2.5)
Dihydrocodeine: NEGATIVE ng/mL (ref <2.5)
Fentanyl: NEGATIVE ng/mL (ref <0.10)
Heroin Metabolite: NEGATIVE ng/mL (ref <1.0)
Hydrocodone: NEGATIVE ng/mL (ref <2.5)
Hydromorphone: NEGATIVE ng/mL (ref <2.5)
MARIJUANA: NEGATIVE ng/mL (ref <2.5)
MDMA: NEGATIVE ng/mL (ref <10)
Meprobamate: NEGATIVE ng/mL (ref <2.5)
Methadone: NEGATIVE ng/mL (ref <5.0)
Morphine: NEGATIVE ng/mL (ref <2.5)
Nicotine Metabolite: NEGATIVE ng/mL (ref <5.0)
Norhydrocodone: NEGATIVE ng/mL (ref <2.5)
Noroxycodone: 37.7 ng/mL — ABNORMAL HIGH (ref <2.5)
Opiates: POSITIVE ng/mL — AB (ref <2.5)
Oxycodone: 125.4 ng/mL — ABNORMAL HIGH (ref <2.5)
Oxymorphone: NEGATIVE ng/mL (ref <2.5)
Phencyclidine: NEGATIVE ng/mL (ref <10)
Tapentadol: NEGATIVE ng/mL (ref <5.0)
Tramadol: NEGATIVE ng/mL (ref <5.0)
Zolpidem: NEGATIVE ng/mL (ref <5.0)

## 2018-08-26 LAB — DRUG TOX ALC METAB W/CON, ORAL FLD: Alcohol Metabolite: NEGATIVE ng/mL (ref <25)

## 2018-08-30 ENCOUNTER — Telehealth: Payer: Self-pay | Admitting: *Deleted

## 2018-08-30 NOTE — Telephone Encounter (Signed)
Oral swab drug screen was consistent for prescribed medications.  ?

## 2018-09-02 ENCOUNTER — Other Ambulatory Visit: Payer: Self-pay | Admitting: Registered Nurse

## 2018-09-02 DIAGNOSIS — M170 Bilateral primary osteoarthritis of knee: Secondary | ICD-10-CM

## 2018-09-02 DIAGNOSIS — M19172 Post-traumatic osteoarthritis, left ankle and foot: Secondary | ICD-10-CM

## 2018-09-02 DIAGNOSIS — G5712 Meralgia paresthetica, left lower limb: Secondary | ICD-10-CM

## 2018-10-05 ENCOUNTER — Other Ambulatory Visit: Payer: Self-pay | Admitting: Nurse Practitioner

## 2018-10-05 DIAGNOSIS — K7469 Other cirrhosis of liver: Secondary | ICD-10-CM

## 2018-10-12 ENCOUNTER — Other Ambulatory Visit: Payer: Medicare Other

## 2018-10-15 ENCOUNTER — Ambulatory Visit
Admission: RE | Admit: 2018-10-15 | Discharge: 2018-10-15 | Disposition: A | Discharge status: Home / Self Care | Payer: Medicare Other | Source: Ambulatory Visit | Attending: Nurse Practitioner | Admitting: Nurse Practitioner

## 2018-10-15 DIAGNOSIS — K7469 Other cirrhosis of liver: Secondary | ICD-10-CM

## 2018-10-25 ENCOUNTER — Other Ambulatory Visit: Payer: Self-pay

## 2018-10-25 ENCOUNTER — Encounter: Payer: Self-pay | Admitting: Registered Nurse

## 2018-10-25 ENCOUNTER — Encounter: Payer: Medicare Other | Attending: Registered Nurse | Admitting: Registered Nurse

## 2018-10-25 VITALS — BP 101/66 | HR 85 | Temp 98.8°F | Ht 66.0 in | Wt 286.0 lb

## 2018-10-25 DIAGNOSIS — G894 Chronic pain syndrome: Secondary | ICD-10-CM

## 2018-10-25 DIAGNOSIS — G5712 Meralgia paresthetica, left lower limb: Secondary | ICD-10-CM | POA: Insufficient documentation

## 2018-10-25 DIAGNOSIS — Z79891 Long term (current) use of opiate analgesic: Secondary | ICD-10-CM | POA: Diagnosis present

## 2018-10-25 DIAGNOSIS — S8412XS Injury of peroneal nerve at lower leg level, left leg, sequela: Secondary | ICD-10-CM | POA: Diagnosis not present

## 2018-10-25 DIAGNOSIS — Z5181 Encounter for therapeutic drug level monitoring: Secondary | ICD-10-CM

## 2018-10-25 DIAGNOSIS — G8929 Other chronic pain: Secondary | ICD-10-CM

## 2018-10-25 DIAGNOSIS — M25511 Pain in right shoulder: Secondary | ICD-10-CM

## 2018-10-25 MED ORDER — OXYCODONE HCL 10 MG PO TABS: 10.0000 mg | ORAL_TABLET | Freq: Three times a day (TID) | ORAL | 0 refills | Status: DC | PRN
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
## 2018-12-14 ENCOUNTER — Other Ambulatory Visit: Payer: Self-pay | Admitting: Registered Nurse

## 2018-12-14 DIAGNOSIS — G5712 Meralgia paresthetica, left lower limb: Secondary | ICD-10-CM

## 2018-12-14 DIAGNOSIS — M170 Bilateral primary osteoarthritis of knee: Secondary | ICD-10-CM

## 2018-12-14 DIAGNOSIS — M19172 Post-traumatic osteoarthritis, left ankle and foot: Secondary | ICD-10-CM

## 2018-12-22 ENCOUNTER — Encounter: Payer: Self-pay | Admitting: Physical Medicine & Rehabilitation

## 2018-12-22 ENCOUNTER — Other Ambulatory Visit: Payer: Self-pay

## 2018-12-22 ENCOUNTER — Encounter: Payer: Medicare Other | Attending: Registered Nurse | Admitting: Physical Medicine & Rehabilitation

## 2018-12-22 DIAGNOSIS — Z79891 Long term (current) use of opiate analgesic: Secondary | ICD-10-CM | POA: Diagnosis present

## 2018-12-22 DIAGNOSIS — S8412XS Injury of peroneal nerve at lower leg level, left leg, sequela: Secondary | ICD-10-CM | POA: Diagnosis not present

## 2018-12-22 DIAGNOSIS — Z5181 Encounter for therapeutic drug level monitoring: Secondary | ICD-10-CM | POA: Diagnosis present

## 2018-12-22 DIAGNOSIS — M7121 Synovial cyst of popliteal space [Baker], right knee: Secondary | ICD-10-CM | POA: Diagnosis not present

## 2018-12-22 DIAGNOSIS — G5712 Meralgia paresthetica, left lower limb: Secondary | ICD-10-CM | POA: Diagnosis present

## 2018-12-22 MED ORDER — OXYCODONE HCL 10 MG PO TABS: 10.0000 mg | ORAL_TABLET | Freq: Three times a day (TID) | ORAL | 0 refills | Status: DC | PRN

## 2018-12-22 MED ORDER — DICLOFENAC SODIUM 50 MG PO TBEC: 50.0000 mg | DELAYED_RELEASE_TABLET | Freq: Two times a day (BID) | ORAL | 3 refills | Status: DC
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
## 2018-12-22 NOTE — Patient Instructions (Signed)
PLEASE FEEL FREE TO CALL OUR OFFICE WITH ANY PROBLEMS OR QUESTIONS ([phone])      

## 2019-02-18 ENCOUNTER — Encounter: Payer: Self-pay | Admitting: Registered Nurse

## 2019-02-18 ENCOUNTER — Encounter: Payer: Medicare Other | Attending: Registered Nurse | Admitting: Registered Nurse

## 2019-02-18 ENCOUNTER — Other Ambulatory Visit: Payer: Self-pay

## 2019-02-18 VITALS — BP 113/71 | HR 65 | Temp 97.7°F | Ht 66.0 in | Wt 291.0 lb

## 2019-02-18 DIAGNOSIS — Z79891 Long term (current) use of opiate analgesic: Secondary | ICD-10-CM | POA: Diagnosis present

## 2019-02-18 DIAGNOSIS — M25511 Pain in right shoulder: Secondary | ICD-10-CM

## 2019-02-18 DIAGNOSIS — Z5181 Encounter for therapeutic drug level monitoring: Secondary | ICD-10-CM | POA: Diagnosis present

## 2019-02-18 DIAGNOSIS — G5712 Meralgia paresthetica, left lower limb: Secondary | ICD-10-CM | POA: Insufficient documentation

## 2019-02-18 DIAGNOSIS — S8412XS Injury of peroneal nerve at lower leg level, left leg, sequela: Secondary | ICD-10-CM

## 2019-02-18 DIAGNOSIS — G894 Chronic pain syndrome: Secondary | ICD-10-CM | POA: Diagnosis not present

## 2019-02-18 DIAGNOSIS — M542 Cervicalgia: Secondary | ICD-10-CM

## 2019-02-18 DIAGNOSIS — M1711 Unilateral primary osteoarthritis, right knee: Secondary | ICD-10-CM

## 2019-02-18 DIAGNOSIS — G8929 Other chronic pain: Secondary | ICD-10-CM

## 2019-02-18 DIAGNOSIS — M47816 Spondylosis without myelopathy or radiculopathy, lumbar region: Secondary | ICD-10-CM

## 2019-02-18 DIAGNOSIS — M7121 Synovial cyst of popliteal space [Baker], right knee: Secondary | ICD-10-CM

## 2019-02-18 MED ORDER — OXYCODONE HCL 10 MG PO TABS: 10.0000 mg | ORAL_TABLET | Freq: Three times a day (TID) | ORAL | 0 refills | Status: DC | PRN

## 2019-02-18 MED ORDER — METHOCARBAMOL 500 MG PO TABS: 500.0000 mg | ORAL_TABLET | Freq: Four times a day (QID) | ORAL | 3 refills | Status: DC | PRN
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
## 2019-02-22 LAB — DRUG TOX MONITOR 1 W/CONF, ORAL FLD
Amphetamines: NEGATIVE ng/mL (ref <10)
Barbiturates: NEGATIVE ng/mL (ref <10)
Benzodiazepines: NEGATIVE ng/mL (ref <0.50)
Buprenorphine: NEGATIVE ng/mL (ref <0.10)
Cocaine: NEGATIVE ng/mL (ref <5.0)
Codeine: NEGATIVE ng/mL (ref <2.5)
Dihydrocodeine: NEGATIVE ng/mL (ref <2.5)
Fentanyl: NEGATIVE ng/mL (ref <0.10)
Heroin Metabolite: NEGATIVE ng/mL (ref <1.0)
Hydrocodone: NEGATIVE ng/mL (ref <2.5)
Hydromorphone: NEGATIVE ng/mL (ref <2.5)
MARIJUANA: NEGATIVE ng/mL (ref <2.5)
MDMA: NEGATIVE ng/mL (ref <10)
Meprobamate: NEGATIVE ng/mL (ref <2.5)
Methadone: NEGATIVE ng/mL (ref <5.0)
Morphine: NEGATIVE ng/mL (ref <2.5)
Nicotine Metabolite: NEGATIVE ng/mL (ref <5.0)
Norhydrocodone: NEGATIVE ng/mL (ref <2.5)
Noroxycodone: 65.9 ng/mL — ABNORMAL HIGH (ref <2.5)
Opiates: POSITIVE ng/mL — AB (ref <2.5)
Oxycodone: 235.6 ng/mL — ABNORMAL HIGH (ref <2.5)
Oxymorphone: 2.8 ng/mL — ABNORMAL HIGH (ref <2.5)
Phencyclidine: NEGATIVE ng/mL (ref <10)
Tapentadol: NEGATIVE ng/mL (ref <5.0)
Tramadol: NEGATIVE ng/mL (ref <5.0)
Zolpidem: NEGATIVE ng/mL (ref <5.0)

## 2019-03-23 ENCOUNTER — Other Ambulatory Visit: Payer: Self-pay | Admitting: Physical Medicine & Rehabilitation

## 2019-03-23 DIAGNOSIS — M19172 Post-traumatic osteoarthritis, left ankle and foot: Secondary | ICD-10-CM

## 2019-03-23 DIAGNOSIS — G5712 Meralgia paresthetica, left lower limb: Secondary | ICD-10-CM

## 2019-03-23 DIAGNOSIS — M170 Bilateral primary osteoarthritis of knee: Secondary | ICD-10-CM

## 2019-03-25 ENCOUNTER — Telehealth: Payer: Self-pay

## 2019-03-25 DIAGNOSIS — S8412XS Injury of peroneal nerve at lower leg level, left leg, sequela: Secondary | ICD-10-CM

## 2019-03-25 DIAGNOSIS — G5712 Meralgia paresthetica, left lower limb: Secondary | ICD-10-CM

## 2019-03-25 MED ORDER — OXYCODONE HCL 10 MG PO TABS: 10.0000 mg | ORAL_TABLET | Freq: Three times a day (TID) | ORAL | 0 refills | Status: DC | PRN

## 2019-03-25 NOTE — Telephone Encounter (Signed)
PMP was Reviewed: Oxycodone e-scribed. Place a call Ms. Sliwa she is aware of the above and verbalizes understanding.

## 2019-03-25 NOTE — Telephone Encounter (Signed)
Patient called stating pharmacy doesn't have a second Rx of her Oxycodone for this month. Last filled 02/23/2019 next appt 04/22/2019. Called pharmacy and they confirmed they didn't have a script

## 2019-03-30 LAB — MICROALBUMIN, URINE: Microalb, Ur: 0.75

## 2019-04-01 ENCOUNTER — Other Ambulatory Visit: Payer: Self-pay | Admitting: Nurse Practitioner

## 2019-04-01 DIAGNOSIS — K7469 Other cirrhosis of liver: Secondary | ICD-10-CM

## 2019-04-07 ENCOUNTER — Ambulatory Visit
Admission: RE | Admit: 2019-04-07 | Discharge: 2019-04-07 | Disposition: A | Discharge status: Home / Self Care | Payer: Medicare Other | Source: Ambulatory Visit | Attending: Nurse Practitioner | Admitting: Nurse Practitioner

## 2019-04-07 DIAGNOSIS — K7469 Other cirrhosis of liver: Secondary | ICD-10-CM

## 2019-04-17 ENCOUNTER — Other Ambulatory Visit: Payer: Self-pay | Admitting: Registered Nurse

## 2019-04-17 ENCOUNTER — Other Ambulatory Visit: Payer: Self-pay | Admitting: Physical Medicine & Rehabilitation

## 2019-04-17 DIAGNOSIS — S8412XS Injury of peroneal nerve at lower leg level, left leg, sequela: Secondary | ICD-10-CM

## 2019-04-17 DIAGNOSIS — M7121 Synovial cyst of popliteal space [Baker], right knee: Secondary | ICD-10-CM

## 2019-04-22 ENCOUNTER — Other Ambulatory Visit: Payer: Self-pay

## 2019-04-22 ENCOUNTER — Encounter: Payer: Self-pay | Admitting: Registered Nurse

## 2019-04-22 ENCOUNTER — Encounter: Payer: Medicare Other | Attending: Registered Nurse | Admitting: Registered Nurse

## 2019-04-22 VITALS — BP 113/76 | HR 86 | Temp 97.0°F | Ht 66.0 in | Wt 291.0 lb

## 2019-04-22 DIAGNOSIS — M47816 Spondylosis without myelopathy or radiculopathy, lumbar region: Secondary | ICD-10-CM

## 2019-04-22 DIAGNOSIS — G5712 Meralgia paresthetica, left lower limb: Secondary | ICD-10-CM | POA: Diagnosis not present

## 2019-04-22 DIAGNOSIS — S8412XS Injury of peroneal nerve at lower leg level, left leg, sequela: Secondary | ICD-10-CM

## 2019-04-22 DIAGNOSIS — Z79891 Long term (current) use of opiate analgesic: Secondary | ICD-10-CM

## 2019-04-22 DIAGNOSIS — G894 Chronic pain syndrome: Secondary | ICD-10-CM | POA: Diagnosis present

## 2019-04-22 DIAGNOSIS — Z5181 Encounter for therapeutic drug level monitoring: Secondary | ICD-10-CM | POA: Diagnosis present

## 2019-04-22 MED ORDER — OXYCODONE HCL 10 MG PO TABS: 10.0000 mg | ORAL_TABLET | Freq: Three times a day (TID) | ORAL | 0 refills | Status: DC | PRN
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
## 2019-06-15 ENCOUNTER — Other Ambulatory Visit: Payer: Self-pay | Admitting: Registered Nurse

## 2019-06-15 DIAGNOSIS — M170 Bilateral primary osteoarthritis of knee: Secondary | ICD-10-CM

## 2019-06-15 DIAGNOSIS — M19172 Post-traumatic osteoarthritis, left ankle and foot: Secondary | ICD-10-CM

## 2019-06-15 DIAGNOSIS — G5712 Meralgia paresthetica, left lower limb: Secondary | ICD-10-CM

## 2019-06-17 ENCOUNTER — Encounter: Payer: Medicare Other | Admitting: Registered Nurse

## 2019-06-18 ENCOUNTER — Other Ambulatory Visit: Payer: Self-pay

## 2019-06-18 ENCOUNTER — Encounter (HOSPITAL_COMMUNITY): Payer: Self-pay | Admitting: Emergency Medicine

## 2019-06-18 ENCOUNTER — Emergency Department (HOSPITAL_COMMUNITY)
Admission: EM | Admit: 2019-06-18 | Discharge: 2019-06-19 | Disposition: A | Discharge status: Home / Self Care | Payer: Medicare Other | Attending: Emergency Medicine | Admitting: Emergency Medicine

## 2019-06-18 DIAGNOSIS — Z7982 Long term (current) use of aspirin: Secondary | ICD-10-CM | POA: Diagnosis not present

## 2019-06-18 DIAGNOSIS — Z794 Long term (current) use of insulin: Secondary | ICD-10-CM | POA: Insufficient documentation

## 2019-06-18 DIAGNOSIS — E039 Hypothyroidism, unspecified: Secondary | ICD-10-CM | POA: Insufficient documentation

## 2019-06-18 DIAGNOSIS — N183 Chronic kidney disease, stage 3 unspecified: Secondary | ICD-10-CM | POA: Insufficient documentation

## 2019-06-18 DIAGNOSIS — R1010 Upper abdominal pain, unspecified: Secondary | ICD-10-CM

## 2019-06-18 DIAGNOSIS — R112 Nausea with vomiting, unspecified: Secondary | ICD-10-CM | POA: Insufficient documentation

## 2019-06-18 DIAGNOSIS — Z960 Presence of urogenital implants: Secondary | ICD-10-CM | POA: Insufficient documentation

## 2019-06-18 DIAGNOSIS — R1013 Epigastric pain: Secondary | ICD-10-CM | POA: Diagnosis not present

## 2019-06-18 DIAGNOSIS — Z79899 Other long term (current) drug therapy: Secondary | ICD-10-CM | POA: Diagnosis not present

## 2019-06-18 DIAGNOSIS — Z87891 Personal history of nicotine dependence: Secondary | ICD-10-CM | POA: Insufficient documentation

## 2019-06-18 DIAGNOSIS — I129 Hypertensive chronic kidney disease with stage 1 through stage 4 chronic kidney disease, or unspecified chronic kidney disease: Secondary | ICD-10-CM | POA: Diagnosis not present

## 2019-06-18 DIAGNOSIS — E1122 Type 2 diabetes mellitus with diabetic chronic kidney disease: Secondary | ICD-10-CM | POA: Insufficient documentation

## 2019-06-18 LAB — URINALYSIS, ROUTINE W REFLEX MICROSCOPIC
Bacteria, UA: NONE SEEN
Bilirubin Urine: NEGATIVE
Glucose, UA: NEGATIVE mg/dL
Ketones, ur: NEGATIVE mg/dL
Nitrite: NEGATIVE
Protein, ur: 100 mg/dL — AB
Specific Gravity, Urine: 1.034 — ABNORMAL HIGH (ref 1.005–1.030)
pH: 6 (ref 5.0–8.0)

## 2019-06-18 LAB — COMPREHENSIVE METABOLIC PANEL
ALT: 31 U/L (ref 0–44)
AST: 59 U/L — ABNORMAL HIGH (ref 15–41)
Albumin: 2.9 g/dL — ABNORMAL LOW (ref 3.5–5.0)
Alkaline Phosphatase: 86 U/L (ref 38–126)
Anion gap: 9 (ref 5–15)
BUN: 15 mg/dL (ref 8–23)
CO2: 26 mmol/L (ref 22–32)
Calcium: 8.9 mg/dL (ref 8.9–10.3)
Chloride: 106 mmol/L (ref 98–111)
Creatinine, Ser: 1.07 mg/dL — ABNORMAL HIGH (ref 0.44–1.00)
GFR calc Af Amer: >60 mL/min (ref >60)
GFR calc non Af Amer: 55 mL/min — ABNORMAL LOW (ref >60)
Glucose, Bld: 152 mg/dL — ABNORMAL HIGH (ref 70–99)
Potassium: 3.9 mmol/L (ref 3.5–5.1)
Sodium: 141 mmol/L (ref 135–145)
Total Bilirubin: 0.8 mg/dL (ref 0.3–1.2)
Total Protein: 7.8 g/dL (ref 6.5–8.1)

## 2019-06-18 LAB — TROPONIN I (HIGH SENSITIVITY): Troponin I (High Sensitivity): 5 ng/L (ref <18)

## 2019-06-18 LAB — LIPASE, BLOOD: Lipase: 21 U/L (ref 11–51)

## 2019-06-18 MED ORDER — SODIUM CHLORIDE 0.9% FLUSH: 3.0000 mL | Freq: Once | INTRAVENOUS | Status: DC

## 2019-06-18 MED ORDER — ONDANSETRON 4 MG PO TBDP
4.0000 mg | ORAL_TABLET | Freq: Once | ORAL | Status: AC | PRN
  Administered 2019-06-18: 4 mg via ORAL
  Filled 2019-06-18: qty 1

## 2019-06-18 NOTE — ED Triage Notes (Signed)
Per EMS, pt from home w/ burping upper GI pain X3 days.  Pt reports black tarry stools and emesis.  Pt reports she has been taking BC powder for dental pain for over a week.    142/62 86 HR 98% RA CBG 139

## 2019-06-19 LAB — CBC
HCT: 43.8 % (ref 36.0–46.0)
Hemoglobin: 13.8 g/dL (ref 12.0–15.0)
MCH: 26.2 pg (ref 26.0–34.0)
MCHC: 31.5 g/dL (ref 30.0–36.0)
MCV: 83.3 fL (ref 80.0–100.0)
Platelets: UNDETERMINED 10*3/uL (ref 150–400)
RBC: 5.26 MIL/uL — ABNORMAL HIGH (ref 3.87–5.11)
RDW: 15.1 % (ref 11.5–15.5)
WBC: 4.8 10*3/uL (ref 4.0–10.5)
nRBC: 0 % (ref 0.0–0.2)

## 2019-06-19 LAB — TROPONIN I (HIGH SENSITIVITY): Troponin I (High Sensitivity): 6 ng/L (ref <18)

## 2019-06-19 MED ORDER — SUCRALFATE 1 GM/10ML PO SUSP: 1.0000 g | Freq: Three times a day (TID) | ORAL | 0 refills | Status: DC

## 2019-06-19 MED ORDER — ONDANSETRON HCL 4 MG/2ML IJ SOLN
4.0000 mg | Freq: Once | INTRAMUSCULAR | Status: AC
  Administered 2019-06-19: 4 mg via INTRAVENOUS
  Filled 2019-06-19: qty 2

## 2019-06-19 MED ORDER — ACETAMINOPHEN 160 MG/5ML PO SOLN
650.0000 mg | Freq: Once | ORAL | Status: AC
  Administered 2019-06-19: 650 mg via ORAL
  Filled 2019-06-19: qty 20.3

## 2019-06-19 MED ORDER — LIDOCAINE VISCOUS HCL 2 % MT SOLN
15.0000 mL | Freq: Once | OROMUCOSAL | Status: AC
  Administered 2019-06-19: 15 mL via ORAL
  Filled 2019-06-19: qty 15

## 2019-06-19 MED ORDER — ALUM & MAG HYDROXIDE-SIMETH 200-200-20 MG/5ML PO SUSP
30.0000 mL | Freq: Once | ORAL | Status: AC
  Administered 2019-06-19: 03:00:00 30 mL via ORAL
  Filled 2019-06-19: qty 30

## 2019-06-19 MED ORDER — SUCRALFATE 1 GM/10ML PO SUSP
1.0000 g | Freq: Once | ORAL | Status: AC
  Administered 2019-06-19: 1 g via ORAL
  Filled 2019-06-19: qty 10

## 2019-06-19 MED ORDER — SODIUM CHLORIDE 0.9 % IV SOLN
1000.0000 mL | INTRAVENOUS | Status: DC
  Administered 2019-06-19: 1000 mL via INTRAVENOUS

## 2019-06-19 MED ORDER — SODIUM CHLORIDE 0.9 % IV BOLUS (SEPSIS)
1000.0000 mL | Freq: Once | INTRAVENOUS | Status: AC
  Administered 2019-06-19: 1000 mL via INTRAVENOUS

## 2019-06-19 MED ORDER — LIDOCAINE VISCOUS HCL 2 % MT SOLN: 15.0000 mL | Freq: Four times a day (QID) | OROMUCOSAL | 0 refills | Status: DC | PRN

## 2019-06-19 NOTE — ED Notes (Signed)
This RN attempted to stick pt for IV, upon unsucessful attempt pt stated she didn't want IV and was refusing IV, fluids, and IV medication.
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
## 2019-06-24 ENCOUNTER — Encounter: Payer: Medicare Other | Attending: Registered Nurse | Admitting: Registered Nurse

## 2019-06-24 ENCOUNTER — Other Ambulatory Visit: Payer: Self-pay

## 2019-06-24 ENCOUNTER — Encounter: Payer: Self-pay | Admitting: Registered Nurse

## 2019-06-24 VITALS — BP 139/97 | HR 74 | Temp 98.2°F | Ht 66.0 in | Wt 294.0 lb

## 2019-06-24 DIAGNOSIS — G894 Chronic pain syndrome: Secondary | ICD-10-CM | POA: Diagnosis not present

## 2019-06-24 DIAGNOSIS — M47816 Spondylosis without myelopathy or radiculopathy, lumbar region: Secondary | ICD-10-CM | POA: Diagnosis present

## 2019-06-24 DIAGNOSIS — G5712 Meralgia paresthetica, left lower limb: Secondary | ICD-10-CM

## 2019-06-24 DIAGNOSIS — Z79891 Long term (current) use of opiate analgesic: Secondary | ICD-10-CM | POA: Insufficient documentation

## 2019-06-24 DIAGNOSIS — Z5181 Encounter for therapeutic drug level monitoring: Secondary | ICD-10-CM

## 2019-06-24 DIAGNOSIS — S8412XS Injury of peroneal nerve at lower leg level, left leg, sequela: Secondary | ICD-10-CM | POA: Diagnosis not present

## 2019-06-24 MED ORDER — OXYCODONE HCL 10 MG PO TABS: 10.0000 mg | ORAL_TABLET | Freq: Three times a day (TID) | ORAL | 0 refills | Status: DC | PRN
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
## 2019-08-17 ENCOUNTER — Other Ambulatory Visit: Payer: Self-pay | Admitting: Physical Medicine & Rehabilitation

## 2019-08-17 DIAGNOSIS — M7121 Synovial cyst of popliteal space [Baker], right knee: Secondary | ICD-10-CM

## 2019-08-24 ENCOUNTER — Other Ambulatory Visit: Payer: Self-pay

## 2019-08-24 ENCOUNTER — Encounter: Payer: Medicare Other | Attending: Registered Nurse | Admitting: Registered Nurse

## 2019-08-24 ENCOUNTER — Encounter: Payer: Self-pay | Admitting: Registered Nurse

## 2019-08-24 VITALS — BP 116/60 | HR 77 | Temp 97.1°F | Ht 66.0 in | Wt 305.0 lb

## 2019-08-24 DIAGNOSIS — S8412XS Injury of peroneal nerve at lower leg level, left leg, sequela: Secondary | ICD-10-CM | POA: Diagnosis present

## 2019-08-24 DIAGNOSIS — Z5181 Encounter for therapeutic drug level monitoring: Secondary | ICD-10-CM

## 2019-08-24 DIAGNOSIS — M62838 Other muscle spasm: Secondary | ICD-10-CM

## 2019-08-24 DIAGNOSIS — G894 Chronic pain syndrome: Secondary | ICD-10-CM

## 2019-08-24 DIAGNOSIS — M47816 Spondylosis without myelopathy or radiculopathy, lumbar region: Secondary | ICD-10-CM

## 2019-08-24 DIAGNOSIS — Z79891 Long term (current) use of opiate analgesic: Secondary | ICD-10-CM

## 2019-08-24 DIAGNOSIS — G5712 Meralgia paresthetica, left lower limb: Secondary | ICD-10-CM

## 2019-08-24 MED ORDER — OXYCODONE HCL 10 MG PO TABS: 10.0000 mg | ORAL_TABLET | Freq: Three times a day (TID) | ORAL | 0 refills | Status: DC | PRN

## 2019-08-24 MED ORDER — METHOCARBAMOL 500 MG PO TABS: 500.0000 mg | ORAL_TABLET | Freq: Four times a day (QID) | ORAL | 3 refills | Status: DC | PRN
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
## 2019-08-30 LAB — DRUG TOX MONITOR 1 W/CONF, ORAL FLD
Amphetamines: NEGATIVE ng/mL (ref <10)
Barbiturates: NEGATIVE ng/mL (ref <10)
Benzodiazepines: NEGATIVE ng/mL (ref <0.50)
Buprenorphine: NEGATIVE ng/mL (ref <0.10)
Cocaine: NEGATIVE ng/mL (ref <5.0)
Codeine: NEGATIVE ng/mL (ref <2.5)
Dihydrocodeine: NEGATIVE ng/mL (ref <2.5)
Fentanyl: NEGATIVE ng/mL (ref <0.10)
Heroin Metabolite: NEGATIVE ng/mL (ref <1.0)
Hydrocodone: NEGATIVE ng/mL (ref <2.5)
Hydromorphone: NEGATIVE ng/mL (ref <2.5)
MARIJUANA: NEGATIVE ng/mL (ref <2.5)
MDMA: NEGATIVE ng/mL (ref <10)
Meprobamate: NEGATIVE ng/mL (ref <2.5)
Methadone: NEGATIVE ng/mL (ref <5.0)
Morphine: NEGATIVE ng/mL (ref <2.5)
Nicotine Metabolite: NEGATIVE ng/mL (ref <5.0)
Norhydrocodone: NEGATIVE ng/mL (ref <2.5)
Noroxycodone: 34.3 ng/mL — ABNORMAL HIGH (ref <2.5)
Opiates: POSITIVE ng/mL — AB (ref <2.5)
Oxycodone: 174.4 ng/mL — ABNORMAL HIGH (ref <2.5)
Oxymorphone: NEGATIVE ng/mL (ref <2.5)
Phencyclidine: NEGATIVE ng/mL (ref <10)
Tapentadol: NEGATIVE ng/mL (ref <5.0)
Tramadol: NEGATIVE ng/mL (ref <5.0)
Zolpidem: NEGATIVE ng/mL (ref <5.0)

## 2019-08-30 LAB — DRUG TOX ALC METAB W/CON, ORAL FLD: Alcohol Metabolite: NEGATIVE ng/mL (ref <25)

## 2019-09-09 ENCOUNTER — Telehealth: Payer: Self-pay | Admitting: *Deleted

## 2019-09-09 NOTE — Telephone Encounter (Signed)
Oral swab drug screen was consistent for prescribed medications.  ?

## 2019-09-17 ENCOUNTER — Other Ambulatory Visit: Payer: Self-pay | Admitting: Registered Nurse

## 2019-09-17 DIAGNOSIS — M19172 Post-traumatic osteoarthritis, left ankle and foot: Secondary | ICD-10-CM

## 2019-09-17 DIAGNOSIS — M170 Bilateral primary osteoarthritis of knee: Secondary | ICD-10-CM

## 2019-09-17 DIAGNOSIS — G5712 Meralgia paresthetica, left lower limb: Secondary | ICD-10-CM

## 2019-09-22 LAB — BASIC METABOLIC PANEL
BUN: 24 — AB (ref 4–21)
Creatinine: 1.2 — AB (ref 0.5–1.1)
Glucose: 128

## 2019-09-22 LAB — LIPID PANEL
Cholesterol: 162 (ref 0–200)
HDL: 43 (ref 35–70)
LDL Cholesterol: 87
Triglycerides: 193 — AB (ref 40–160)

## 2019-09-22 LAB — TSH: TSH: 3.93 (ref 0.41–5.90)

## 2019-09-22 LAB — HEMOGLOBIN A1C: Hemoglobin A1C: 7.3

## 2019-09-26 ENCOUNTER — Other Ambulatory Visit: Payer: Self-pay | Admitting: Nurse Practitioner

## 2019-09-26 DIAGNOSIS — K7469 Other cirrhosis of liver: Secondary | ICD-10-CM

## 2019-10-04 ENCOUNTER — Ambulatory Visit
Admission: RE | Admit: 2019-10-04 | Discharge: 2019-10-04 | Disposition: A | Discharge status: Home / Self Care | Payer: Medicare Other | Source: Ambulatory Visit | Attending: Nurse Practitioner | Admitting: Nurse Practitioner

## 2019-10-04 DIAGNOSIS — K7469 Other cirrhosis of liver: Secondary | ICD-10-CM

## 2019-10-13 ENCOUNTER — Other Ambulatory Visit (INDEPENDENT_AMBULATORY_CARE_PROVIDER_SITE_OTHER): Payer: Self-pay | Admitting: Family Medicine

## 2019-10-13 ENCOUNTER — Ambulatory Visit (INDEPENDENT_AMBULATORY_CARE_PROVIDER_SITE_OTHER): Payer: Medicare Other | Admitting: Family Medicine

## 2019-10-13 ENCOUNTER — Encounter (INDEPENDENT_AMBULATORY_CARE_PROVIDER_SITE_OTHER): Payer: Self-pay | Admitting: Family Medicine

## 2019-10-13 ENCOUNTER — Other Ambulatory Visit: Payer: Self-pay

## 2019-10-13 VITALS — BP 113/79 | HR 77 | Temp 97.7°F | Ht 66.0 in | Wt 293.0 lb

## 2019-10-13 DIAGNOSIS — R0602 Shortness of breath: Secondary | ICD-10-CM | POA: Diagnosis not present

## 2019-10-13 DIAGNOSIS — Z6841 Body Mass Index (BMI) 40.0 and over, adult: Secondary | ICD-10-CM

## 2019-10-13 DIAGNOSIS — G8929 Other chronic pain: Secondary | ICD-10-CM

## 2019-10-13 DIAGNOSIS — R5383 Other fatigue: Secondary | ICD-10-CM | POA: Diagnosis not present

## 2019-10-13 DIAGNOSIS — E119 Type 2 diabetes mellitus without complications: Secondary | ICD-10-CM | POA: Diagnosis not present

## 2019-10-13 DIAGNOSIS — E038 Other specified hypothyroidism: Secondary | ICD-10-CM

## 2019-10-13 DIAGNOSIS — G4709 Other insomnia: Secondary | ICD-10-CM

## 2019-10-13 DIAGNOSIS — Z0289 Encounter for other administrative examinations: Secondary | ICD-10-CM

## 2019-10-13 DIAGNOSIS — Z1331 Encounter for screening for depression: Secondary | ICD-10-CM | POA: Diagnosis not present

## 2019-10-13 DIAGNOSIS — K7469 Other cirrhosis of liver: Secondary | ICD-10-CM

## 2019-10-13 DIAGNOSIS — Z794 Long term (current) use of insulin: Secondary | ICD-10-CM

## 2019-10-13 NOTE — Progress Notes (Signed)
Dear Dr. Brigitte Pulse,   Thank you for referring Katherine [name] to our clinic. The following note includes my evaluation and treatment recommendations.  Chief Complaint:   OBESITY Katherine [name] (MR# [id number]) is a 64 y.o. female who presents for evaluation and treatment of obesity and related comorbidities. Current BMI is Body mass index is 47.29 kg/m. Katherine [name] has been struggling with her weight for many years and has been unsuccessful in either losing weight, maintaining weight loss, or reaching her healthy weight goal.  Katherine [name] is currently in the action stage of change and ready to dedicate time achieving and maintaining a healthier weight. Katherine [name] is interested in becoming our patient and working on intensive lifestyle modifications including (but not limited to) diet and exercise for weight loss.  Katherine [name] was referred by her PCP, Dr. Derrill Memo.  She lives with her son, daughter-in-law, and granddaughter.  She does not want them to know that she is seeing [name] or working on weight loss.  She only eats supper and will drink Crystal Lite or diet sodas.  Katherine [name]'s habits were reviewed today and are as follows: Her family eats meals together, she thinks her family will eat healthier with her, her desired weight loss is 140 pounds, she has been heavy most of her life, she started gaining excessive weight over the last 3 years, her heaviest weight ever was 303 pounds, she craves bread, crackers, sweets, and alfredo, she snacks frequently in the evenings, she skips breakfast and lunch frequently, she is frequently drinking liquids with calories, she frequently makes poor food choices, she has problems with excessive hunger, she frequently eats larger portions than normal and she struggles with emotional eating.  Depression Screen [name]'s Food and Mood (modified PHQ-9) score was 16.  Depression screen [facility] 2/9 10/13/2019  Decreased Interest 3  Down, Depressed, Hopeless 2  PHQ - 2  Score 5  Altered sleeping 3  Tired, decreased energy 3  Change in appetite 2  Feeling bad or failure about yourself  1  Trouble concentrating 1  Moving slowly or fidgety/restless 1  Suicidal thoughts 0  PHQ-9 Score 16  Difficult doing work/chores Somewhat difficult  Some recent data might be hidden   Subjective:   1. Other fatigue Katherine [name] denies daytime somnolence and reports waking up still tired. Patent has a history of symptoms of morning fatigue, morning headache and snoring. Katherine [name] generally gets 0 hours of sleep per night, and states that she has very poor sleep quality. Snoring is present. Apneic episodes are not present. Epworth Sleepiness Score is 4.  2. SOB (shortness of breath) on exertion Katherine [name] notes increasing shortness of breath with exercising and seems to be worsening over time with weight gain. She notes getting out of breath sooner with activity than she used to. This has gotten worse recently. Katherine [name] denies shortness of breath at rest or orthopnea.  3. Other specified hypothyroidism Stable on levothyroxine 25 mcg daily.  Lab Results  Component Value Date   TSH 3.93 09/22/2019   4. Insulin dependent type 2 diabetes mellitus ([place]) Medications reviewed. Diabetic ROS: no polyuria or polydipsia, no chest pain, dyspnea or TIA's, no numbness, tingling or pain in extremities.  She is followed by Dr. [name].  She takes Humalog 14 units before a big meal.  She tests at night.  Before a big dose blood sugars run 250, 170, 400.  Taking [name] 80 units as needed.  If blood sugar is less than 200, she will take  half.  Lab Results  Component Value Date   HGBA1C 7.3 09/22/2019   HGBA1C 6.5 (H) 07/28/2018   HGBA1C 10.1 (H) 09/08/2012   Lab Results  Component Value Date   MICROALBUR 0.750 03/30/2019   LDLCALC 87 09/22/2019   CREATININE 1.2 (A) 09/22/2019   5. Other chronic pain [name] is followed by Pain Management.  6. Other insomnia [name] stays up all  night and takes naps during the day.  She has history of negative sleep studies twice several years ago when she was 100 pounds lighter.  7. Other cirrhosis of liver ([name]) On 09/26/2019, she was seen at [place].  Note was reviewed.  Secondary to NASH.  MELD 7.  8. Encounter for screening for depression [name] was screened for depression as part of her new patient workup.  PHQ-9 is 16.  Assessment/Plan:   1. Other fatigue Patti does feel that her weight is causing her energy to be lower than it should be. Fatigue may be related to obesity, depression or many other causes. Labs will be ordered, and in the meanwhile, Katherine [name] will focus on self care including making healthy food choices, increasing physical activity and focusing on stress reduction.  - EKG 12-Lead  2. SOB (shortness of breath) on exertion Katherine [name] does feel that she gets out of breath more easily that she used to when she exercises. Katherine [name]'s shortness of breath appears to be obesity related and exercise induced. She has agreed to work on weight loss and gradually increase exercise to treat her exercise induced shortness of breath. Will continue to monitor closely.  3. Other specified hypothyroidism Patient with long-standing hypothyroidism, on levothyroxine therapy. She appears euthyroid. Orders and follow up as documented in patient record.  Counseling . Good thyroid control is important for overall health. Supratherapeutic thyroid levels are dangerous and will not improve weight loss results. . The correct way to take levothyroxine is fasting, with water, separated by at least 30 minutes from breakfast, and separated by more than 4 hours from calcium, iron, multivitamins, acid reflux medications (PPIs).   4. Insulin dependent type 2 diabetes mellitus (HCC) Good blood sugar control is important to decrease the likelihood of diabetic complications such as nephropathy, neuropathy, limb loss, blindness,  coronary artery disease, and death. Intensive lifestyle modification including diet, exercise and weight loss are the first line of treatment for diabetes.   5. Other chronic pain Followed by Pain Management for this problem. Those encounter notes were reviewed.  6. Other insomnia Insufficient sleep is associated with obesity, DM2, CAD, HTN, and premature death. Metabolic and endocrine manifestations include decreased glucose tolerance, decreased insulin sensitivity, increased evening cortisol, increased ghrelin, and decreased leptin. Adults sleeping <= 5 h were 55% more likely to have obesity than those sleeping >5 hours. A reduction of 1 h of sleep is associated with 0.35kg/m2 increase in BMI. - Curr Obes Rep (2012) 1:245-256.  - Ambulatory referral to Sleep Studies  7. Other cirrhosis of liver ([place]) Followed by [facility] for this problem. Those encounter notes were reviewed.  8. Encounter for screening for depression Auriah had a positive depression screening. Depression is commonly associated with obesity and often results in emotional eating behaviors. We will monitor this closely and work on CBT to help improve the non-hunger eating patterns. Referral to Psychology may be required if no improvement is seen as she continues in our clinic.  9. Class 3 severe obesity with serious comorbidity and body mass index (BMI) of 45.0 to 49.9  in adult, unspecified obesity type [facility]) Kemaria is currently in the action stage of change and her goal is to continue with weight loss efforts. I recommend Laura begin the structured treatment plan as follows:  She has agreed to keeping a food journal and adhering to recommended goals of 1500 calories and 95+ grams of protein.  Exercise goals: No exercise has been prescribed at this time.   Behavioral modification strategies: increasing lean protein intake, decreasing simple carbohydrates, increasing vegetables, increasing water intake, decreasing liquid  calories, decreasing sodium intake and increasing high fiber foods.  She was informed of the importance of frequent follow-up visits to maximize her success with intensive lifestyle modifications for her multiple health conditions. She was informed we would discuss her lab results at her next visit unless there is a critical issue that needs to be addressed sooner. Jacquie agreed to keep her next visit at the agreed upon time to discuss these results.  Objective:   Blood pressure 113/79, pulse 77, temperature 97.7 F (36.5 C), temperature source Oral, height 5' 6"  (1.676 m), weight 293 lb (132.9 kg), SpO2 93 %. Body mass index is 47.29 kg/m.  EKG: Normal sinus rhythm, rate 79 bpm.  Indirect Calorimeter completed today shows a VO2 of 335 and a REE of 2328.  Her calculated basal metabolic rate is 1941 thus her basal metabolic rate is better than expected.  General: Cooperative, alert, well developed, in no acute distress. HEENT: Conjunctivae and lids unremarkable. Cardiovascular: Regular rhythm.  Lungs: Normal work of breathing. Neurologic: No focal deficits.   Lab Results  Component Value Date   CREATININE 1.2 (A) 09/22/2019   BUN 24 (A) 09/22/2019   NA 141 06/18/2019   K 3.9 06/18/2019   CL 106 06/18/2019   CO2 26 06/18/2019   Lab Results  Component Value Date   ALT 31 06/18/2019   AST 59 (H) 06/18/2019   ALKPHOS 86 06/18/2019   BILITOT 0.8 06/18/2019   Lab Results  Component Value Date   HGBA1C 7.3 09/22/2019   HGBA1C 6.5 (H) 07/28/2018   HGBA1C 10.1 (H) 09/08/2012   HGBA1C 10.1 (H) 09/07/2012   HGBA1C (H) 04/20/2010    5.7 (NOTE)                                                                       According to the ADA Clinical Practice Recommendations for 2011, when HbA1c is used as a screening test:   >=6.5%   Diagnostic of Diabetes Mellitus           (if abnormal result  is confirmed)  5.7-6.4%   Increased risk of developing Diabetes Mellitus  References:Diagnosis  and Classification of Diabetes Mellitus,Diabetes DEYC,1448,18(HUDJS 1):S62-S69 and Standards of Medical Care in         Diabetes - 2011,Diabetes Care,2011,34  (Suppl 1):S11-S61.   Lab Results  Component Value Date   TSH 3.93 09/22/2019   Lab Results  Component Value Date   CHOL 162 09/22/2019   HDL 43 09/22/2019   LDLCALC 87 09/22/2019   TRIG 193 (A) 09/22/2019   CHOLHDL 2.8 08/19/2017   Lab Results  Component Value Date   WBC 4.8 06/18/2019   HGB 13.8 06/18/2019   HCT 43.8 06/18/2019  MCV 83.3 06/18/2019   PLT PLATELET CLUMPS NOTED ON SMEAR, UNABLE TO ESTIMATE 06/18/2019   Lab Results  Component Value Date   IRON 39 08/19/2017   TIBC 404 08/19/2017   Attestation Statements:   This is the patient's first visit at [facility]. The patient's NEW PATIENT PACKET was reviewed at length. Included in the packet: current and past health history, medications, allergies, ROS, gynecologic history (women only), surgical history, family history, social history, weight history, weight loss surgery history (for those that have had weight loss surgery), nutritional evaluation, mood and food questionnaire, PHQ9, Epworth questionnaire, sleep habits questionnaire, patient life and health improvement goals questionnaire. These will all be scanned into the patient's chart under media.   During the visit, I independently reviewed the patient's EKG, bioimpedance scale results, and indirect calorimeter results. I used this information to tailor a meal plan for the patient that will help her to lose weight and will improve her obesity-related conditions going forward. I performed a medically necessary appropriate examination and/or evaluation. I discussed the assessment and treatment plan with the patient. The patient was provided an opportunity to ask questions and all were answered. The patient agreed with the plan and demonstrated an understanding of the instructions. Labs were ordered at  this visit and will be reviewed at the next visit unless more critical results need to be addressed immediately. Clinical information was updated and documented in the EMR.   Time spent on visit including pre-visit chart review and post-visit charting and care was 65 minutes.   I, Water quality scientist, CMA, am acting as transcriptionist for Briscoe Deutscher, DO  I have reviewed the above documentation for accuracy and completeness, and I agree with the above. Briscoe Deutscher, DO

## 2019-10-25 ENCOUNTER — Encounter: Payer: Medicare Other | Admitting: Registered Nurse

## 2019-10-27 ENCOUNTER — Other Ambulatory Visit: Payer: Self-pay

## 2019-10-27 ENCOUNTER — Encounter: Payer: Self-pay | Admitting: Registered Nurse

## 2019-10-27 ENCOUNTER — Encounter: Payer: Medicare Other | Attending: Registered Nurse | Admitting: Registered Nurse

## 2019-10-27 VITALS — BP 124/85 | HR 69 | Temp 98.2°F | Ht 66.0 in | Wt 297.0 lb

## 2019-10-27 DIAGNOSIS — R1031 Right lower quadrant pain: Secondary | ICD-10-CM

## 2019-10-27 DIAGNOSIS — G894 Chronic pain syndrome: Secondary | ICD-10-CM | POA: Diagnosis present

## 2019-10-27 DIAGNOSIS — S8412XS Injury of peroneal nerve at lower leg level, left leg, sequela: Secondary | ICD-10-CM

## 2019-10-27 DIAGNOSIS — G5712 Meralgia paresthetica, left lower limb: Secondary | ICD-10-CM

## 2019-10-27 DIAGNOSIS — Z79891 Long term (current) use of opiate analgesic: Secondary | ICD-10-CM

## 2019-10-27 DIAGNOSIS — Z5181 Encounter for therapeutic drug level monitoring: Secondary | ICD-10-CM | POA: Diagnosis present

## 2019-10-27 DIAGNOSIS — G8929 Other chronic pain: Secondary | ICD-10-CM

## 2019-10-27 DIAGNOSIS — M62838 Other muscle spasm: Secondary | ICD-10-CM

## 2019-10-27 DIAGNOSIS — M47816 Spondylosis without myelopathy or radiculopathy, lumbar region: Secondary | ICD-10-CM

## 2019-10-27 DIAGNOSIS — M1711 Unilateral primary osteoarthritis, right knee: Secondary | ICD-10-CM

## 2019-10-27 DIAGNOSIS — M25511 Pain in right shoulder: Secondary | ICD-10-CM

## 2019-10-27 MED ORDER — OXYCODONE HCL 10 MG PO TABS: 10.0000 mg | ORAL_TABLET | Freq: Three times a day (TID) | ORAL | 0 refills | Status: DC | PRN
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
## 2019-10-31 ENCOUNTER — Other Ambulatory Visit: Payer: Self-pay

## 2019-10-31 ENCOUNTER — Ambulatory Visit (INDEPENDENT_AMBULATORY_CARE_PROVIDER_SITE_OTHER): Payer: Medicare Other | Admitting: Family Medicine

## 2019-10-31 ENCOUNTER — Encounter (INDEPENDENT_AMBULATORY_CARE_PROVIDER_SITE_OTHER): Payer: Self-pay | Admitting: Family Medicine

## 2019-10-31 VITALS — BP 112/77 | HR 80 | Temp 98.0°F | Ht 66.0 in | Wt 292.0 lb

## 2019-10-31 DIAGNOSIS — Z794 Long term (current) use of insulin: Secondary | ICD-10-CM | POA: Diagnosis not present

## 2019-10-31 DIAGNOSIS — E1169 Type 2 diabetes mellitus with other specified complication: Secondary | ICD-10-CM

## 2019-10-31 DIAGNOSIS — Z6841 Body Mass Index (BMI) 40.0 and over, adult: Secondary | ICD-10-CM | POA: Diagnosis not present

## 2019-10-31 MED ORDER — OZEMPIC (0.25 OR 0.5 MG/DOSE) 2 MG/1.5ML ~~LOC~~ SOPN: 0.5000 mg | PEN_INJECTOR | SUBCUTANEOUS | 0 refills | Status: DC

## 2019-11-01 ENCOUNTER — Ambulatory Visit (INDEPENDENT_AMBULATORY_CARE_PROVIDER_SITE_OTHER): Payer: Medicare Other | Admitting: Family Medicine

## 2019-11-01 NOTE — Progress Notes (Signed)
Chief Complaint:   OBESITY Hazeline is here to discuss her progress with her obesity treatment plan along with follow-up of her obesity related diagnoses. Lilee is on keeping a food journal and adhering to recommended goals of 1500 calories and 95+ grams of protein and states she is following her eating plan approximately 20-30% of the time. Kolina states she has increased her walking for exercise.  Today's visit was #: 2 Starting weight: 293 lbs Starting date: 10/13/2019 Today's weight: 292 lbs Today's date: 10/31/2019 Total lbs lost to date: 1 lbs Total lbs lost since last in-office visit: 1 lbs  Interim History: Pennye says she is still struggling to eat.  She is having Glucerna for breakfast/lunch.  She says she eats with her family for supper.  Softer food, such as fish, is easier for her to eat.  She has dehydrated fruit for snacks. She did let her family know that she is seeing [name] at [facility]   Assessment/Plan:   1. Type 2 diabetes mellitus with other specified complication, with long-term current use of insulin ([place]) [name] asked her endocrinologist after the last visit if GLP-1 RA is okay to trial.  He agreed.  Blood sugar log reviewed.  Insulin need decreased by 1/2 several days over the past week.  Ozempic started at 0.25 mg per week.  Tolerating well.  Will increase to 0.5 mg weekly, as per below.  - Semaglutide,0.25 or 0.5MG/DOS, (OZEMPIC, 0.25 OR 0.5 MG/DOSE,) 2 MG/1.5ML SOPN; Inject 0.375 mLs (0.5 mg total) into the skin once a week.  Dispense: 1.5 mL; Refill: 0  2. Class 3 severe obesity with serious comorbidity and body mass index (BMI) of 45.0 to 49.9 in adult, unspecified obesity type [facility]) Merrilee is currently in the action stage of change. As such, her goal is to continue with weight loss efforts. She has agreed to keeping a food journal and adhering to recommended goals of 1200-1500 calories and 95+ grams of protein.   Add protein (eggs, yogurt)  throughout the day.  Exercise goals: Older adults should follow the adult guidelines. When older adults cannot meet the adult guidelines, they should be as physically active as their abilities and conditions will allow.  Older adults should do exercises that maintain or improve balance if they are at risk of falling.   Behavioral modification strategies: increasing lean protein intake and increasing water intake.  Nyjae has agreed to follow-up with our clinic in 2-3 weeks. She was informed of the importance of frequent follow-up visits to maximize her success with intensive lifestyle modifications for her multiple health conditions.   Objective:   Blood pressure 112/77, pulse 80, temperature 98 F (36.7 C), temperature source Oral, height 5' 6"  (1.676 m), weight 292 lb (132.5 kg), SpO2 93 %. Body mass index is 47.13 kg/m.  General: Cooperative, alert, well developed, in no acute distress. HEENT: Conjunctivae and lids unremarkable. Cardiovascular: Regular rhythm.  Lungs: Normal work of breathing. Neurologic: No focal deficits.   Lab Results  Component Value Date   CREATININE 1.2 (A) 09/22/2019   BUN 24 (A) 09/22/2019   NA 141 06/18/2019   K 3.9 06/18/2019   CL 106 06/18/2019   CO2 26 06/18/2019   Lab Results  Component Value Date   ALT 31 06/18/2019   AST 59 (H) 06/18/2019   ALKPHOS 86 06/18/2019   BILITOT 0.8 06/18/2019   Lab Results  Component Value Date   HGBA1C 7.3 09/22/2019   HGBA1C 6.5 (H) 07/28/2018  HGBA1C 10.1 (H) 09/08/2012   HGBA1C 10.1 (H) 09/07/2012   HGBA1C (H) 04/20/2010    5.7 (NOTE)                                                                       According to the ADA Clinical Practice Recommendations for 2011, when HbA1c is used as a screening test:   >=6.5%   Diagnostic of Diabetes Mellitus           (if abnormal result  is confirmed)  5.7-6.4%   Increased risk of developing Diabetes Mellitus  References:Diagnosis and Classification of  Diabetes Mellitus,Diabetes IONG,2952,84(XLKGM 1):S62-S69 and Standards of Medical Care in         Diabetes - 2011,Diabetes Care,2011,34  (Suppl 1):S11-S61.   Lab Results  Component Value Date   TSH 3.93 09/22/2019   Lab Results  Component Value Date   CHOL 162 09/22/2019   HDL 43 09/22/2019   LDLCALC 87 09/22/2019   TRIG 193 (A) 09/22/2019   CHOLHDL 2.8 08/19/2017   Lab Results  Component Value Date   WBC 4.8 06/18/2019   HGB 13.8 06/18/2019   HCT 43.8 06/18/2019   MCV 83.3 06/18/2019   PLT PLATELET CLUMPS NOTED ON SMEAR, UNABLE TO ESTIMATE 06/18/2019   Lab Results  Component Value Date   IRON 39 08/19/2017   TIBC 404 08/19/2017   Obesity Behavioral Intervention:   Approximately 15 minutes were spent on the discussion below.  ASK: We discussed the diagnosis of obesity with [name] today and Faiza agreed to give [name] permission to discuss obesity behavioral modification therapy today.  ASSESS: Quenna has the diagnosis of obesity and her BMI today is 47.3. Ashby is in the action stage of change.   ADVISE: Marylouise was educated on the multiple health risks of obesity as well as the benefit of weight loss to improve her health. She was advised of the need for long term treatment and the importance of lifestyle modifications to improve her current health and to decrease her risk of future health problems.  AGREE: Multiple dietary modification options and treatment options were discussed and Kumiko agreed to follow the recommendations documented in the above note.  ARRANGE: Angelly was educated on the importance of frequent visits to treat obesity as outlined per CMS and USPSTF guidelines and agreed to schedule her next follow up appointment today.  Attestation Statements:   Reviewed by clinician on day of visit: allergies, medications, problem list, medical history, surgical history, family history, social history, and previous encounter notes.  I, Network engineer, CMA, am acting as transcriptionist for Briscoe Deutscher, DO  I have reviewed the above documentation for accuracy and completeness, and I agree with the above. Briscoe Deutscher, DO

## 2019-11-16 ENCOUNTER — Ambulatory Visit (INDEPENDENT_AMBULATORY_CARE_PROVIDER_SITE_OTHER): Payer: Medicare Other | Admitting: Family Medicine

## 2019-11-16 ENCOUNTER — Other Ambulatory Visit: Payer: Self-pay

## 2019-11-16 ENCOUNTER — Encounter (INDEPENDENT_AMBULATORY_CARE_PROVIDER_SITE_OTHER): Payer: Self-pay | Admitting: Family Medicine

## 2019-11-16 VITALS — BP 108/66 | HR 75 | Temp 97.8°F | Ht 66.0 in | Wt 278.0 lb

## 2019-11-16 DIAGNOSIS — K5909 Other constipation: Secondary | ICD-10-CM

## 2019-11-16 DIAGNOSIS — E1169 Type 2 diabetes mellitus with other specified complication: Secondary | ICD-10-CM

## 2019-11-16 DIAGNOSIS — E1159 Type 2 diabetes mellitus with other circulatory complications: Secondary | ICD-10-CM

## 2019-11-16 DIAGNOSIS — Z6841 Body Mass Index (BMI) 40.0 and over, adult: Secondary | ICD-10-CM | POA: Diagnosis not present

## 2019-11-16 DIAGNOSIS — Z794 Long term (current) use of insulin: Secondary | ICD-10-CM

## 2019-11-16 DIAGNOSIS — I10 Essential (primary) hypertension: Secondary | ICD-10-CM

## 2019-11-20 NOTE — Progress Notes (Signed)
Chief Complaint:   OBESITY Katherine [name] [name] here to discuss her progress with her obesity treatment plan along with follow-up of her obesity related diagnoses. Katherine [name] [name] on keeping a food journal and adhering to recommended goals of 1200-1500 calories and 95+ grams of protein and states she [name] following her eating plan a small amount of the time. Katherine [name] states she has been doing more walking for exercise.  Today's visit was #: 3 Starting weight: 293 lbs Starting date: 10/13/2019 Today's weight: 278 lbs Today's date: 11/16/2019 Total lbs lost to date: 15 lbs Total lbs lost since last in-office visit: 14 lbs Total weight loss percentage to date: -5.12%  Interim History: Katherine [name] says she has Katherine been doing anymore night eating.  She reports that she will be seeing the sleep specialist on Monday, and has a follow-up with her PCP next month.  She says she [name] happy with the plan.  She [name] down a total of 15 pounds.  Assessment/Plan:   1. Type 2 diabetes mellitus with other specified complication, with long-term current use of insulin (HCC) Diabetes Mellitus: needs improvement. Issues reviewed with her: blood sugar goals, complications of diabetes mellitus, hypoglycemia prevention and treatment, exercise, nutrition, and carbohydrate counting.    Medications: [name] (SSI, decreased need), Ozempic. She [name] monitoring blood sugars. No hypoglycemia.   Lab Results  Component Value Date   HGBA1C 7.3 09/22/2019   HGBA1C 6.5 (H) 07/28/2018   HGBA1C 10.1 (H) 09/08/2012   Lab Results  Component Value Date   MICROALBUR 0.750 03/30/2019   LDLCALC 87 09/22/2019   CREATININE 1.2 (A) 09/22/2019   2. Hypertension associated with type 2 diabetes mellitus ([place]) At goal. Medications: Lisinopril, HCTZ, Bystolic. We will monitor for hypotension with continued weight loss.   BP Readings from Last 3 Encounters:  11/16/19 108/66  10/31/19 112/77  10/27/19 124/85   3. Other constipation This [name]  likely due to high protein foods and GLP1RA. We reviewed the importance of a bowel regimen.  Counseling Getting to Good Bowel Health: Your goal [name] to have one soft bowel movement each day. Drink at least 8 glasses of water each day. Eat plenty of fiber (goal [name] over 25 grams each day). It [name] best to get most of your fiber from dietary sources which includes leafy green vegetables, fresh fruit, and whole grains. You may need to add fiber with the help of OTC fiber supplements. These include Metamucil, Citrucel, and Flaxseed. If you are still having trouble, try adding Miralax or Magnesium Citrate. If all of these changes do Katherine work, [employer].  4. Class 3 severe obesity with serious comorbidity and body mass index (BMI) of 45.0 to 49.9 in adult, unspecified obesity type [facility])  Katherine [name] currently in the action stage of change. As such, her goal [name] to continue with weight loss efforts. She has agreed to keeping a food journal and adhering to recommended goals of 1500 calories and 95 grams of protein.   Exercise goals: Older adults should follow the adult guidelines. When older adults cannot meet the adult guidelines, they should be as physically active as their abilities and conditions will allow.  Older adults should do exercises that maintain or improve balance if they are at risk of falling.   Behavioral modification strategies: increasing lean protein intake, decreasing sodium intake and increasing high fiber foods.  Katherine [name] has agreed to follow-up with our clinic in 2-3 weeks. She was informed of the importance of frequent follow-up  visits to maximize her success with intensive lifestyle modifications for her multiple health conditions.   Objective:   Blood pressure 108/66, pulse 75, temperature 97.8 F (36.6 C), temperature source Oral, height 5' 6"  (1.676 m), weight 278 lb (126.1 kg), SpO2 92 %. Body mass index [name] 44.87 kg/m.  General: Cooperative, alert, well developed,  in no acute distress. HEENT: Conjunctivae and lids unremarkable. Cardiovascular: Regular rhythm.  Lungs: Normal work of breathing. Neurologic: No focal deficits.   Lab Results  Component Value Date   CREATININE 1.2 (A) 09/22/2019   BUN 24 (A) 09/22/2019   NA 141 06/18/2019   K 3.9 06/18/2019   CL 106 06/18/2019   CO2 26 06/18/2019   Lab Results  Component Value Date   ALT 31 06/18/2019   AST 59 (H) 06/18/2019   ALKPHOS 86 06/18/2019   BILITOT 0.8 06/18/2019   Lab Results  Component Value Date   HGBA1C 7.3 09/22/2019   HGBA1C 6.5 (H) 07/28/2018   HGBA1C 10.1 (H) 09/08/2012   HGBA1C 10.1 (H) 09/07/2012   HGBA1C (H) 04/20/2010    5.7 (NOTE)                                                                       According to the ADA Clinical Practice Recommendations for 2011, when HbA1c [name] used as a screening test:   >=6.5%   Diagnostic of Diabetes Mellitus           (if abnormal result  [name] confirmed)  5.7-6.4%   Increased risk of developing Diabetes Mellitus  References:Diagnosis and Classification of Diabetes Mellitus,Diabetes FMBW,4665,99(JTTSV 1):S62-S69 and Standards of Medical Care in         Diabetes - 2011,Diabetes Care,2011,34  (Suppl 1):S11-S61.   Lab Results  Component Value Date   TSH 3.93 09/22/2019   Lab Results  Component Value Date   CHOL 162 09/22/2019   HDL 43 09/22/2019   LDLCALC 87 09/22/2019   TRIG 193 (A) 09/22/2019   CHOLHDL 2.8 08/19/2017   Lab Results  Component Value Date   WBC 4.8 06/18/2019   HGB 13.8 06/18/2019   HCT 43.8 06/18/2019   MCV 83.3 06/18/2019   PLT PLATELET CLUMPS NOTED ON SMEAR, UNABLE TO ESTIMATE 06/18/2019   Lab Results  Component Value Date   IRON 39 08/19/2017   TIBC 404 08/19/2017    Obesity Behavioral Intervention:   Approximately 15 minutes were spent on the discussion below.  ASK: We discussed the diagnosis of obesity with Katherine [name] today and Katherine [name] agreed to give [name] permission to discuss obesity  behavioral modification therapy today.  ASSESS: Katherine [name] has the diagnosis of obesity and her BMI today [name] 45.0. Katherine [name] [name] in the action stage of change.   ADVISE: Kebra was educated on the multiple health risks of obesity as well as the benefit of weight loss to improve her health. She was advised of the need for long term treatment and the importance of lifestyle modifications to improve her current health and to decrease her risk of future health problems.  AGREE: Multiple dietary modification options and treatment options were discussed and Kielyn agreed to follow the recommendations documented in the above note.  ARRANGE: Alessa was educated on the importance of frequent  visits to treat obesity as outlined per CMS and USPSTF guidelines and agreed to schedule her next follow up appointment today.  Attestation Statements:   Reviewed by clinician on day of visit: allergies, medications, problem list, medical history, surgical history, family history, social history, and previous encounter notes.  I, Water quality scientist, CMA, am acting as transcriptionist for Briscoe Deutscher, DO  I have reviewed the above documentation for accuracy and completeness, and I agree with the above. Briscoe Deutscher, DO

## 2019-11-21 ENCOUNTER — Ambulatory Visit: Payer: Medicare Other | Admitting: Neurology

## 2019-11-21 ENCOUNTER — Other Ambulatory Visit: Payer: Self-pay

## 2019-11-21 ENCOUNTER — Encounter: Payer: Self-pay | Admitting: Neurology

## 2019-11-21 ENCOUNTER — Telehealth (INDEPENDENT_AMBULATORY_CARE_PROVIDER_SITE_OTHER): Payer: Self-pay

## 2019-11-21 VITALS — BP 118/70 | HR 84 | Ht 66.0 in | Wt 281.3 lb

## 2019-11-21 DIAGNOSIS — E1169 Type 2 diabetes mellitus with other specified complication: Secondary | ICD-10-CM

## 2019-11-21 DIAGNOSIS — F1190 Opioid use, unspecified, uncomplicated: Secondary | ICD-10-CM

## 2019-11-21 DIAGNOSIS — Z6841 Body Mass Index (BMI) 40.0 and over, adult: Secondary | ICD-10-CM

## 2019-11-21 DIAGNOSIS — G4719 Other hypersomnia: Secondary | ICD-10-CM | POA: Diagnosis not present

## 2019-11-21 DIAGNOSIS — G4733 Obstructive sleep apnea (adult) (pediatric): Secondary | ICD-10-CM

## 2019-11-21 DIAGNOSIS — R519 Headache, unspecified: Secondary | ICD-10-CM

## 2019-11-21 DIAGNOSIS — Z794 Long term (current) use of insulin: Secondary | ICD-10-CM

## 2019-11-21 DIAGNOSIS — R351 Nocturia: Secondary | ICD-10-CM

## 2019-11-21 DIAGNOSIS — Z820 Family history of epilepsy and other diseases of the nervous system: Secondary | ICD-10-CM

## 2019-11-21 NOTE — Patient Instructions (Signed)
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
## 2019-11-21 NOTE — Telephone Encounter (Signed)
Dr. Juleen China the drug store called and said no new dosage on the Ozempic.  The drug store said you would have to put a new dosage for the insurance to pay for it. I went from .5 to 1 per patient

## 2019-11-22 ENCOUNTER — Telehealth: Payer: Self-pay

## 2019-11-22 NOTE — Telephone Encounter (Signed)
Clarify dose with patient. Okay to send in 1 mg if tolerating.

## 2019-11-22 NOTE — Telephone Encounter (Signed)
LVM for pt to call me back to schedule sleep study  

## 2019-11-23 MED ORDER — OZEMPIC (1 MG/DOSE) 4 MG/3ML ~~LOC~~ SOPN: 1.0000 mg | PEN_INJECTOR | SUBCUTANEOUS | 0 refills | Status: DC

## 2019-11-23 NOTE — Telephone Encounter (Signed)
RX sent per patients request

## 2019-11-28 ENCOUNTER — Ambulatory Visit (INDEPENDENT_AMBULATORY_CARE_PROVIDER_SITE_OTHER): Payer: Medicare Other | Admitting: Neurology

## 2019-11-28 DIAGNOSIS — F1190 Opioid use, unspecified, uncomplicated: Secondary | ICD-10-CM

## 2019-11-28 DIAGNOSIS — G4733 Obstructive sleep apnea (adult) (pediatric): Secondary | ICD-10-CM

## 2019-11-28 DIAGNOSIS — R351 Nocturia: Secondary | ICD-10-CM

## 2019-11-28 DIAGNOSIS — G4719 Other hypersomnia: Secondary | ICD-10-CM

## 2019-11-28 DIAGNOSIS — Z6841 Body Mass Index (BMI) 40.0 and over, adult: Secondary | ICD-10-CM

## 2019-11-28 DIAGNOSIS — Z820 Family history of epilepsy and other diseases of the nervous system: Secondary | ICD-10-CM

## 2019-11-28 DIAGNOSIS — R519 Headache, unspecified: Secondary | ICD-10-CM

## 2019-11-30 ENCOUNTER — Encounter (INDEPENDENT_AMBULATORY_CARE_PROVIDER_SITE_OTHER): Payer: Self-pay | Admitting: Family Medicine

## 2019-11-30 ENCOUNTER — Other Ambulatory Visit: Payer: Self-pay

## 2019-11-30 ENCOUNTER — Ambulatory Visit (INDEPENDENT_AMBULATORY_CARE_PROVIDER_SITE_OTHER): Payer: Medicare Other | Admitting: Family Medicine

## 2019-11-30 VITALS — BP 107/71 | HR 101 | Temp 97.9°F | Ht 66.0 in | Wt 273.0 lb

## 2019-11-30 DIAGNOSIS — Z6841 Body Mass Index (BMI) 40.0 and over, adult: Secondary | ICD-10-CM

## 2019-11-30 DIAGNOSIS — I10 Essential (primary) hypertension: Secondary | ICD-10-CM | POA: Diagnosis not present

## 2019-11-30 DIAGNOSIS — Z794 Long term (current) use of insulin: Secondary | ICD-10-CM

## 2019-11-30 DIAGNOSIS — E1169 Type 2 diabetes mellitus with other specified complication: Secondary | ICD-10-CM

## 2019-11-30 DIAGNOSIS — G4733 Obstructive sleep apnea (adult) (pediatric): Secondary | ICD-10-CM

## 2019-11-30 MED ORDER — OZEMPIC (1 MG/DOSE) 4 MG/3ML ~~LOC~~ SOPN: 1.0000 mg | PEN_INJECTOR | SUBCUTANEOUS | 0 refills | Status: DC

## 2019-12-01 NOTE — Progress Notes (Signed)
Chief Complaint:   OBESITY Katherine [name] is here to discuss her progress with her obesity treatment plan along with follow-up of her obesity related diagnoses. Katherine [name] is on keeping a food journal and adhering to recommended goals of 1200-1500 calories and 95+ grams of protein and states she is following her eating plan a small percentage. Katherine [name] states she has increased her walking.  Today's visit was #: 4 Starting weight: 293 lbs Starting date: 10/13/2019 Today's weight: 273 lbs Today's date: 11/30/2019 Total lbs lost to date: 20 lbs Total lbs lost since last in-office visit: 5 lbs  Interim History: Shamere says she is doing well. She continues to follow calorie and protein guidelines. He is finding it easier to do now that her healthy habits are more routine. Her family has started to follow her lead. Blood sugars more tightly controlled. Significant decrease in insulin need.  Assessment/Plan:   1. Type 2 diabetes mellitus with other specified complication, with long-term current use of insulin (Blanket) Diabetes Mellitus: well controlled. Medication: Humalog 7 units, Ozempic, [name]. Issues reviewed with her: blood sugar goals, complications of diabetes mellitus, hypoglycemia prevention and treatment, exercise, nutrition, and carbohydrate counting.  Fasting today 110, low 70, high 196.  Lab Results  Component Value Date   HGBA1C 7.3 09/22/2019   HGBA1C 6.5 (H) 07/28/2018   HGBA1C 10.1 (H) 09/08/2012   Lab Results  Component Value Date   MICROALBUR 0.750 03/30/2019   LDLCALC 87 09/22/2019   CREATININE 1.2 (A) 09/22/2019   - Semaglutide, 1 MG/DOSE, (OZEMPIC, 1 MG/DOSE,) 4 MG/3ML SOPN; Inject 1 mg into the skin once a week.  Dispense: 9 mL; Refill: 0  2. Essential hypertension Doing well. Medications: HCTZ, Bystolic. We will monitor for hypotension with continued weight loss.  Decreased HCTZ by 1/2 at last visit.  BP Readings from Last 3 Encounters:  11/30/19 107/71    11/21/19 118/70  11/16/19 108/66   Lab Results  Component Value Date   CREATININE 1.2 (A) 09/22/2019   3. OSA (obstructive sleep apnea) Katherine [name] had a sleep study last week.  Will continue to monitor symptoms as they relate to her weight loss journey.This issue directly impacts care plan for optimization of BMI and metabolic health as it impacts the patient's ability to make lifestyle changes.  4. Class 3 severe obesity with serious comorbidity and body mass index (BMI) of 40.0 to 44.9 in adult, unspecified obesity type Katherine [name])  Katherine [name] is currently in the action stage of change. As such, her goal is to continue with weight loss efforts. She has agreed to keeping a food journal and adhering to recommended goals of 1200-1500 calories and 95 grams of protein.   Exercise goals: Older adults should follow the adult guidelines. When older adults cannot meet the adult guidelines, they should be as physically active as their abilities and conditions will allow.  Older adults should do exercises that maintain or improve balance if they are at risk of falling.   Behavioral modification strategies: increasing lean protein intake and increasing water intake.  Katherine [name] has agreed to follow-up with our clinic in 3 weeks. She was informed of the importance of frequent follow-up visits to maximize her success with intensive lifestyle modifications for her multiple health conditions.   Objective:   Blood pressure 107/71, pulse (!) 101, temperature 97.9 F (36.6 C), temperature source Oral, height 5' 6"  (1.676 m), weight 273 lb (123.8 kg), SpO2 92 %. Body mass index is 44.06 kg/m.  General: Cooperative, alert, well  developed, in no acute distress. HEENT: Conjunctivae and lids unremarkable. Cardiovascular: Regular rhythm.  Lungs: Normal work of breathing. Neurologic: No focal deficits.   Lab Results  Component Value Date   CREATININE 1.2 (A) 09/22/2019   BUN 24 (A) 09/22/2019   NA 141  06/18/2019   K 3.9 06/18/2019   CL 106 06/18/2019   CO2 26 06/18/2019   Lab Results  Component Value Date   ALT 31 06/18/2019   AST 59 (H) 06/18/2019   ALKPHOS 86 06/18/2019   BILITOT 0.8 06/18/2019   Lab Results  Component Value Date   HGBA1C 7.3 09/22/2019   HGBA1C 6.5 (H) 07/28/2018   HGBA1C 10.1 (H) 09/08/2012   HGBA1C 10.1 (H) 09/07/2012   HGBA1C (H) 04/20/2010    5.7 (NOTE)                                                                       According to the ADA Clinical Practice Recommendations for 2011, when HbA1c is used as a screening test:   >=6.5%   Diagnostic of Diabetes Mellitus           (if abnormal result  is confirmed)  5.7-6.4%   Increased risk of developing Diabetes Mellitus  References:Diagnosis and Classification of Diabetes Mellitus,Diabetes QTMA,2633,35(KTGYB 1):S62-S69 and Standards of Medical Care in         Diabetes - 2011,Diabetes Care,2011,34  (Suppl 1):S11-S61.   No results found for: INSULIN Lab Results  Component Value Date   TSH 3.93 09/22/2019   Lab Results  Component Value Date   CHOL 162 09/22/2019   HDL 43 09/22/2019   LDLCALC 87 09/22/2019   TRIG 193 (A) 09/22/2019   CHOLHDL 2.8 08/19/2017   Lab Results  Component Value Date   WBC 4.8 06/18/2019   HGB 13.8 06/18/2019   HCT 43.8 06/18/2019   MCV 83.3 06/18/2019   PLT PLATELET CLUMPS NOTED ON SMEAR, UNABLE TO ESTIMATE 06/18/2019   Lab Results  Component Value Date   IRON 39 08/19/2017   TIBC 404 08/19/2017   Obesity Behavioral Intervention:   Approximately 15 minutes were spent on the discussion below.  ASK: We discussed the diagnosis of obesity with Katherine [name] today and Chastelyn agreed to give [name] permission to discuss obesity behavioral modification therapy today.  ASSESS: Katherine [name] has the diagnosis of obesity and her BMI today is 44.1. Katherine [name] is in the action stage of change.   ADVISE: Katherine [name] was educated on the multiple health risks of obesity as well as the  benefit of weight loss to improve her health. She was advised of the need for long term treatment and the importance of lifestyle modifications to improve her current health and to decrease her risk of future health problems.  AGREE: Multiple dietary modification options and treatment options were discussed and Katherine [name] agreed to follow the recommendations documented in the above note.  ARRANGE: Katherine [name] was educated on the importance of frequent visits to treat obesity as outlined per CMS and USPSTF guidelines and agreed to schedule her next follow up appointment today.  Attestation Statements:   Reviewed by clinician on day of visit: allergies, medications, problem list, medical history, surgical history, family history, social history, and previous encounter notes.  I, [employer]  Agner, CMA, am acting as transcriptionist for Briscoe Deutscher, DO  I have reviewed the above documentation for accuracy and completeness, and I agree with the above. Briscoe Deutscher, DO

## 2019-12-01 NOTE — Procedures (Signed)
PATIENT'S NAME:  Katherine [name], [place] DOB:      [birth year]      MR#:    [id number]     DATE OF RECORDING: 11/28/2019 REFERRING M.D.:  Briscoe Deutscher, DO Study Performed:   Baseline Polysomnogram HISTORY: 64 year old woman with a history of liver disease with history of cirrhosis, pulmonary hypertension, myoclonus, hypertension, hyperlipidemia, history of subdural hematoma in 2013, history of fall with left femur fracture and right humerus fracture, history of migraines, reflux disease, depression, diabetes, anxiety and obesity with a BMI of over 40, who reports snoring and excessive daytime somnolence, as well as a prior diagnosis of OSA. Her Epworth sleepiness score is 12 out of 24. The patient's weight 281 pounds with a height of 66 (inches), resulting in a BMI of 45. kg/m2. The patient's neck circumference measured 15.8 inches.  CURRENT MEDICATIONS: Fosamax, Aspirin, Voltaren, Cymbalta, Neurontin, Hydrodiuril, Humalog, Synthroid, Zestril, Robaxin, Remeron, Multivitamins and minerals, Bystolic, Oxycondone, Protonix, Miralax, Crestor, Ozempic, Senokot, Carafate, Klonopin.   PROCEDURE:  This is a multichannel digital polysomnogram utilizing the Somnostar 11.2 system.  Electrodes and sensors were applied and monitored per AASM Specifications.   EEG, EOG, Chin and Limb EMG, were sampled at 200 Hz.  ECG, Snore and Nasal Pressure, Thermal Airflow, Respiratory Effort, CPAP Flow and Pressure, Oximetry was sampled at 50 Hz. Digital video and audio were recorded.      BASELINE STUDY  Lights Out was at 22:16 and Lights On at 05:10.  Total recording time (TRT) was 415 minutes, with a total sleep time (TST) of 314 minutes.   The patient's sleep latency to persistent sleep was 63 minutes.  REM latency was 371.5 minutes, which is markedly delayed. The sleep efficiency was 75.7%.     SLEEP ARCHITECTURE: WASO (Wake after sleep onset) was 79.5 minutes with one longer period of wakefulness and otherwise mild sleep  fragmentation noted. There were 9.5 minutes in Stage N1, 264 minutes Stage N2, 22 minutes Stage N3 and 18.5 minutes in Stage REM.  The percentage of Stage N1 was 3.%, Stage N2 was 84.1%, which is markedly increased, Stage N3 was 7.% and Stage R (REM sleep) was 5.9%, which is markedly reduced. The arousals were noted as: 130 were spontaneous, 0 were associated with PLMs, 20 were associated with respiratory events.  RESPIRATORY ANALYSIS:  There were a total of 54 respiratory events:  3 obstructive apneas, 0 central apneas and 0 mixed apneas with a total of 3 apneas and an apnea index (AI) of .6 /hour. There were 51 hypopneas with a hypopnea index of 9.7 /hour. The patient also had 0 respiratory event related arousals (RERAs).      The total APNEA/HYPOPNEA INDEX (AHI) was 10.3/hour and the total RESPIRATORY DISTURBANCE INDEX was  10.3 /hour.  3 events occurred in REM sleep and 98 events in NREM. The REM AHI was  9.7 /hour, versus a non-REM AHI of 10.4. The patient spent 48.5 minutes of total sleep time in the supine position and 266 minutes in non-supine.. The supine AHI was 8.6 versus a non-supine AHI of 10.6.  OXYGEN SATURATION & C02:  The Wake baseline 02 saturation was 93%, with the lowest being 82%. Time spent below 89% saturation equaled 86 minutes. There were sensor loss and errors in the O2 monitoring intermittently.   PERIODIC LIMB MOVEMENTS: The patient had a total of 0 Periodic Limb Movements.  The Periodic Limb Movement (PLM) index was 0 and the PLM Arousal index was 0/hour.  Audio and video  analysis did not show any abnormal or unusual movements, behaviors, phonations or vocalizations.  The patient took 1 bathroom break. Mild snoring was noted. The EKG was in keeping with normal sinus rhythm (NSR). Post-study, the patient indicated that sleep was the same as usual.   IMPRESSION: 1. Mild Obstructive Sleep Apnea (OSA) 2. Dysfunctions associated with sleep stages or arousal from  sleep  RECOMMENDATIONS: 1. This study demonstrates overall mild obstructive sleep apnea with a total AHI of 10.3/hour, REM AHI of 9.7/hour, supine AHI of 8.6/hour and O2 nadir of 82%. The absence of supine sleep likely underestimates his AHI and O2 nadir. Given the patient's medical history and sleep related complaints, treatment with positive airway pressure is recommended; this can be achieved in the form of autoPAP. Alternatively, a full-night CPAP titration study would allow optimization of therapy if needed, down the road. Other treatment options may include avoidance of supine sleep position along with weight loss, upper airway or jaw surgery in selected patients or the use of an oral appliance in certain patients. ENT evaluation and/or consultation with a maxillofacial surgeon or dentist may be feasible in some instances.    2. Please note that untreated obstructive sleep apnea may carry additional perioperative morbidity. Patients with significant obstructive sleep apnea should receive perioperative PAP therapy and the surgeons and particularly the anesthesiologist should be informed of the diagnosis and the severity of the sleep disordered breathing. 3. The patient should be cautioned not to drive, work at heights, or operate dangerous or heavy equipment when tired or sleepy. Review and reiteration of good sleep hygiene measures should be pursued with any patient. 4. This study shows sleep fragmentation and abnormal sleep stage percentages; these are nonspecific findings and per se do not signify an intrinsic sleep disorder or a cause for the patient's sleep-related symptoms. Causes include (but are not limited to) the first night effect of the sleep study, circadian rhythm disturbances, medication effect or an underlying mood disorder or medical problem.  5. The patient will be seen in follow-up by Dr. Rexene Alberts at [facility] for discussion of the test results and further management strategies. The referring  provider will be notified of the test results.  I certify that I have reviewed the entire raw data recording prior to the issuance of this report in accordance with the Standards of Accreditation of the American Academy of Sleep Medicine (AASM)  Star Age, MD, PhD Diplomat, American Board of Neurology and Sleep Medicine (Neurology and Sleep Medicine)

## 2019-12-01 NOTE — Progress Notes (Signed)
Patient referred by Dr. Juleen China for re-eval of OSA, seen by me on 11/21/19, diagnostic PSG on 11/28/19.    Please call and notify the patient that the recent sleep study did confirm the diagnosis of obstructive sleep apnea. OSA is overall mild, but worth treating to see if she feels better after treatment. To that end I recommend treatment for this in the form of autoPAP, which means, that we don't have to bring her back for a second sleep study with CPAP, but will let him try an autoPAP machine at home, through a DME company (of her choice, or as per insurance requirement). The DME representative will educate her on how to use the machine, how to put the mask on, etc. I have placed an order in the chart. Please send referral, talk to patient, send report to referring MD. We will need a FU in sleep clinic for 10 weeks post-PAP set up, please arrange that with me or one of our NPs. Thanks,   Star Age, MD, PhD [facility])

## 2019-12-01 NOTE — Addendum Note (Signed)
Addended by: Star Age on: 12/01/2019 06:53 PM   Modules accepted: Orders

## 2019-12-05 ENCOUNTER — Telehealth: Payer: Self-pay

## 2019-12-05 NOTE — Telephone Encounter (Signed)
-----   Message from Star Age, MD sent at 12/01/2019  6:53 PM EDT ----- Patient referred by Dr. Juleen China for re-eval of OSA, seen by me on 11/21/19, diagnostic PSG on 11/28/19.    Please call and notify the patient that the recent sleep study did confirm the diagnosis of obstructive sleep apnea. OSA is overall mild, but worth treating to see if she feels better after treatment. To that end I recommend treatment for this in the form of autoPAP, which means, that we don't have to bring her back for a second sleep study with CPAP, but will let him try an autoPAP machine at home, through a DME company (of her choice, or as per insurance requirement). The DME representative will educate her on how to use the machine, how to put the mask on, etc. I have placed an order in the chart. Please send referral, talk to patient, send report to referring MD. We will need a FU in sleep clinic for 10 weeks post-PAP set up, please arrange that with me or one of our NPs. Thanks,   Star Age, MD, PhD [facility])

## 2019-12-05 NOTE — Telephone Encounter (Signed)
I called pt. I advised pt that Dr. Rexene Alberts reviewed their sleep study results and found that pt has mild osa. Dr. Rexene Alberts recommends that pt start an auto pap at home. I reviewed PAP compliance expectations with the pt. Pt is agreeable to starting an auto-PAP. I advised pt that an order will be sent to a DME, [employer], and [employer] will call the pt within about one week after they file with the pt's insurance. [employer] will show the pt how to use the machine, fit for masks, and troubleshoot the auto-PAP if needed. A follow up appt was made for insurance purposes with Amy, NP on 03/13/2020 at 2:30pm. Pt verbalized understanding to arrive 15 minutes early and bring their auto-PAP. A letter with all of this information in it will be mailed to the pt as a reminder. I verified with the pt that the address we have on file is correct. Pt verbalized understanding of results. Pt had no questions at this time but was encouraged to call back if questions arise. I have sent the order to [employer] and have received confirmation that they have received the order.

## 2019-12-13 LAB — MICROALBUMIN, URINE: Microalb, Ur: 0.75

## 2019-12-13 LAB — TSH: TSH: 3.45 (ref 0.41–5.90)

## 2019-12-13 LAB — LIPID PANEL
Cholesterol: 95 (ref 0–200)
HDL: 44 (ref 35–70)
LDL Cholesterol: 29
LDl/HDL Ratio: 2.2
Triglycerides: 123 (ref 40–160)

## 2019-12-18 ENCOUNTER — Other Ambulatory Visit: Payer: Self-pay | Admitting: Registered Nurse

## 2019-12-18 DIAGNOSIS — G5712 Meralgia paresthetica, left lower limb: Secondary | ICD-10-CM

## 2019-12-18 DIAGNOSIS — M19172 Post-traumatic osteoarthritis, left ankle and foot: Secondary | ICD-10-CM

## 2019-12-18 DIAGNOSIS — M170 Bilateral primary osteoarthritis of knee: Secondary | ICD-10-CM

## 2019-12-20 ENCOUNTER — Other Ambulatory Visit: Payer: Self-pay | Admitting: Physical Medicine & Rehabilitation

## 2019-12-20 DIAGNOSIS — M7121 Synovial cyst of popliteal space [Baker], right knee: Secondary | ICD-10-CM

## 2019-12-22 ENCOUNTER — Ambulatory Visit (INDEPENDENT_AMBULATORY_CARE_PROVIDER_SITE_OTHER): Payer: Medicare Other | Admitting: Family Medicine

## 2019-12-27 ENCOUNTER — Encounter (INDEPENDENT_AMBULATORY_CARE_PROVIDER_SITE_OTHER): Payer: Self-pay | Admitting: Family Medicine

## 2019-12-27 ENCOUNTER — Ambulatory Visit (INDEPENDENT_AMBULATORY_CARE_PROVIDER_SITE_OTHER): Payer: Medicare Other | Admitting: Family Medicine

## 2019-12-27 ENCOUNTER — Other Ambulatory Visit: Payer: Self-pay

## 2019-12-27 ENCOUNTER — Encounter: Payer: Medicare Other | Admitting: Registered Nurse

## 2019-12-27 VITALS — BP 103/65 | HR 67 | Temp 97.8°F | Ht 66.0 in | Wt 268.0 lb

## 2019-12-27 DIAGNOSIS — E1169 Type 2 diabetes mellitus with other specified complication: Secondary | ICD-10-CM

## 2019-12-27 DIAGNOSIS — E1159 Type 2 diabetes mellitus with other circulatory complications: Secondary | ICD-10-CM

## 2019-12-27 DIAGNOSIS — I152 Hypertension secondary to endocrine disorders: Secondary | ICD-10-CM

## 2019-12-27 DIAGNOSIS — Z6841 Body Mass Index (BMI) 40.0 and over, adult: Secondary | ICD-10-CM

## 2019-12-27 DIAGNOSIS — Z9181 History of falling: Secondary | ICD-10-CM

## 2019-12-27 DIAGNOSIS — Z794 Long term (current) use of insulin: Secondary | ICD-10-CM

## 2019-12-27 DIAGNOSIS — E66813 Obesity, class 3: Secondary | ICD-10-CM

## 2019-12-28 ENCOUNTER — Encounter: Payer: Self-pay | Admitting: Registered Nurse

## 2019-12-28 ENCOUNTER — Encounter: Payer: Medicare Other | Attending: Registered Nurse | Admitting: Registered Nurse

## 2019-12-28 ENCOUNTER — Other Ambulatory Visit: Payer: Self-pay

## 2019-12-28 VITALS — BP 123/79 | HR 77 | Temp 98.1°F | Ht 66.0 in | Wt 270.4 lb

## 2019-12-28 DIAGNOSIS — Z5181 Encounter for therapeutic drug level monitoring: Secondary | ICD-10-CM

## 2019-12-28 DIAGNOSIS — Y92009 Unspecified place in unspecified non-institutional (private) residence as the place of occurrence of the external cause: Secondary | ICD-10-CM | POA: Diagnosis not present

## 2019-12-28 DIAGNOSIS — W19XXXD Unspecified fall, subsequent encounter: Secondary | ICD-10-CM | POA: Insufficient documentation

## 2019-12-28 DIAGNOSIS — Z79891 Long term (current) use of opiate analgesic: Secondary | ICD-10-CM | POA: Diagnosis not present

## 2019-12-28 DIAGNOSIS — G894 Chronic pain syndrome: Secondary | ICD-10-CM | POA: Diagnosis present

## 2019-12-28 DIAGNOSIS — S8412XS Injury of peroneal nerve at lower leg level, left leg, sequela: Secondary | ICD-10-CM

## 2019-12-28 DIAGNOSIS — R202 Paresthesia of skin: Secondary | ICD-10-CM | POA: Diagnosis not present

## 2019-12-28 DIAGNOSIS — M47816 Spondylosis without myelopathy or radiculopathy, lumbar region: Secondary | ICD-10-CM

## 2019-12-28 MED ORDER — OXYCODONE HCL 10 MG PO TABS: 10.0000 mg | ORAL_TABLET | Freq: Three times a day (TID) | ORAL | 0 refills | Status: DC | PRN
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
## 2019-12-29 NOTE — Progress Notes (Signed)
Chief Complaint:   OBESITY Katherine [name] is here to discuss her progress with her obesity treatment plan along with follow-up of her obesity related diagnoses.   Today's visit was #: 5 Starting weight: 293 lbs Starting date: 10/13/2019 Today's weight: 268 lbs Today's date: 12/27/2019 Total lbs lost to date: 25 lbs Body mass index is 43.26 kg/m.  Total weight loss percentage to date: -8.53%  Interim History: Katherine [name] fell onto her knees, jarring her whole body.  She will be seeing Ortho/Pain tomorrow.  She says her balance is worse.  She is down 25 pounds today.  Nutrition Plan:  Keeping a food journal and adhering to recommended goals of 1200-1500 calories and 95 grams of protein. Anti-obesity medications: Ozempic 1 mg subcutaneously weekly. Reported side effects: None. Hunger is well controlled controlled. Cravings are well controlled controlled.  Activity: Walking as tolerated. Sleep: Poor due to altered circadian rhythm.  Assessment/Plan:   1. Type 2 diabetes mellitus with other specified complication, with long-term current use of insulin (HCC) Diabetes Mellitus: stable. Medication: Ozempic, Humalog.  Issues reviewed with her: blood sugar goals, complications of diabetes mellitus, hypoglycemia prevention and treatment, exercise, and nutrition.  Plan:  She is generally off SSI.  Decreased to 7 units of long acting insulin.  Lab Results  Component Value Date   HGBA1C 7.3 09/22/2019   HGBA1C 6.5 (H) 07/28/2018   HGBA1C 10.1 (H) 09/08/2012   Lab Results  Component Value Date   MICROALBUR 0.750 03/30/2019   LDLCALC 87 09/22/2019   CREATININE 1.2 (A) 09/22/2019   2. History of fall Last week.  Mechanical.  She will see her pain management doctor tomorrow. We will continue to monitor symptoms as they relate to her weight loss journey.  3. Hypertension associated with type 2 diabetes mellitus ([place]) At goal. Medications: HCTZ (recently decreased to 1/2 tab), lisinopril,  and Bystolic. We will monitor for hypotension with continued weight loss.   BP Readings from Last 3 Encounters:  12/28/19 123/79  12/27/19 103/65  11/30/19 107/71   Lab Results  Component Value Date   CREATININE 1.2 (A) 09/22/2019   4. Class 3 severe obesity with serious comorbidity and body mass index (BMI) of 40.0 to 44.9 in adult, unspecified obesity type [facility])  Katherine [name] is currently in the action stage of change. As such, her goal is to continue with weight loss efforts.   Nutrition goals: She has agreed to keeping a food journal and adhering to recommended goals of 1200-1500 calories and 95+ grams of protein.   Exercise goals: As tolerated.  Behavioral modification strategies: increasing lean protein intake, decreasing simple carbohydrates, increasing vegetables and increasing water intake.  Katherine [name] has agreed to follow-up with our clinic in 3 weeks. She was informed of the importance of frequent follow-up visits to maximize her success with intensive lifestyle modifications for her multiple health conditions.   Objective:   Blood pressure 103/65, pulse 67, temperature 97.8 F (36.6 C), height 5' 6"  (1.676 m), weight 268 lb (121.6 kg), SpO2 93 %. Body mass index is 43.26 kg/m.  General: Cooperative, alert, well developed, in no acute distress. HEENT: Conjunctivae and lids unremarkable. Cardiovascular: Regular rhythm.  Lungs: Normal work of breathing. Neurologic: No focal deficits.   Lab Results  Component Value Date   CREATININE 1.2 (A) 09/22/2019   BUN 24 (A) 09/22/2019   NA 141 06/18/2019   K 3.9 06/18/2019   CL 106 06/18/2019   CO2 26 06/18/2019   Lab Results  Component  Value Date   ALT 31 06/18/2019   AST 59 (H) 06/18/2019   ALKPHOS 86 06/18/2019   BILITOT 0.8 06/18/2019   Lab Results  Component Value Date   HGBA1C 7.3 09/22/2019   HGBA1C 6.5 (H) 07/28/2018   HGBA1C 10.1 (H) 09/08/2012   HGBA1C 10.1 (H) 09/07/2012   HGBA1C (H) 04/20/2010     5.7 (NOTE)                                                                       According to the ADA Clinical Practice Recommendations for 2011, when HbA1c is used as a screening test:   >=6.5%   Diagnostic of Diabetes Mellitus           (if abnormal result  is confirmed)  5.7-6.4%   Increased risk of developing Diabetes Mellitus  References:Diagnosis and Classification of Diabetes Mellitus,Diabetes [id number],0086,76(PPJKD 1):S62-S69 and Standards of Medical Care in         Diabetes - 2011,Diabetes Care,2011,34  (Suppl 1):S11-S61.   Lab Results  Component Value Date   TSH 3.93 09/22/2019   Lab Results  Component Value Date   CHOL 162 09/22/2019   HDL 43 09/22/2019   LDLCALC 87 09/22/2019   TRIG 193 (A) 09/22/2019   CHOLHDL 2.8 08/19/2017   Lab Results  Component Value Date   WBC 4.8 06/18/2019   HGB 13.8 06/18/2019   HCT 43.8 06/18/2019   MCV 83.3 06/18/2019   PLT PLATELET CLUMPS NOTED ON SMEAR, UNABLE TO ESTIMATE 06/18/2019   Lab Results  Component Value Date   IRON 39 08/19/2017   TIBC 404 08/19/2017   Obesity Behavioral Intervention:   Approximately 15 minutes were spent on the discussion below.  ASK: We discussed the diagnosis of obesity with Katherine [name] today and Katherine [name] agreed to give [name] permission to discuss obesity behavioral modification therapy today.  ASSESS: Katherine [name] has the diagnosis of obesity and her BMI today is 43.4. Katherine [name] is in the action stage of change.   ADVISE: Katherine [name] was educated on the multiple health risks of obesity as well as the benefit of weight loss to improve her health. She was advised of the need for long term treatment and the importance of lifestyle modifications to improve her current health and to decrease her risk of future health problems.  AGREE: Multiple dietary modification options and treatment options were discussed and Katherine [name] agreed to follow the recommendations documented in the above note.  ARRANGE: Katherine [name] was  educated on the importance of frequent visits to treat obesity as outlined per CMS and USPSTF guidelines and agreed to schedule her next follow up appointment today.  Attestation Statements:   Reviewed by clinician on day of visit: allergies, medications, problem list, medical history, surgical history, family history, social history, and previous encounter notes.  I, Water quality scientist, CMA, am acting as transcriptionist for Briscoe Deutscher, DO  I have reviewed the above documentation for accuracy and completeness, and I agree with the above. Briscoe Deutscher, DO

## 2019-12-30 LAB — BASIC METABOLIC PANEL
BUN: 44 — AB (ref 4–21)
CO2: 33 — AB (ref 13–22)
Chloride: 101 (ref 99–108)
Glucose: 119
Potassium: 4.4 (ref 3.4–5.3)
Sodium: 142 (ref 137–147)

## 2019-12-30 LAB — HEPATIC FUNCTION PANEL
ALT: 38 — AB (ref 7–35)
AST: 41 — AB (ref 13–35)
Alkaline Phosphatase: 60 (ref 25–125)
Bilirubin, Total: 0.6

## 2019-12-30 LAB — COMPREHENSIVE METABOLIC PANEL
Albumin: 3.6 (ref 3.5–5.0)
Calcium: 10.4 (ref 8.7–10.7)

## 2020-01-01 LAB — DRUG TOX MONITOR 1 W/CONF, ORAL FLD
Amphetamines: NEGATIVE ng/mL (ref <10)
Barbiturates: NEGATIVE ng/mL (ref <10)
Benzodiazepines: NEGATIVE ng/mL (ref <0.50)
Buprenorphine: NEGATIVE ng/mL (ref <0.10)
Cocaine: NEGATIVE ng/mL (ref <5.0)
Codeine: NEGATIVE ng/mL (ref <2.5)
Dihydrocodeine: NEGATIVE ng/mL (ref <2.5)
Fentanyl: NEGATIVE ng/mL (ref <0.10)
Heroin Metabolite: NEGATIVE ng/mL (ref <1.0)
Hydrocodone: NEGATIVE ng/mL (ref <2.5)
Hydromorphone: NEGATIVE ng/mL (ref <2.5)
MARIJUANA: NEGATIVE ng/mL (ref <2.5)
MDMA: NEGATIVE ng/mL (ref <10)
Meprobamate: NEGATIVE ng/mL (ref <2.5)
Methadone: NEGATIVE ng/mL (ref <5.0)
Morphine: NEGATIVE ng/mL (ref <2.5)
Nicotine Metabolite: NEGATIVE ng/mL (ref <5.0)
Norhydrocodone: NEGATIVE ng/mL (ref <2.5)
Noroxycodone: 41.4 ng/mL — ABNORMAL HIGH (ref <2.5)
Opiates: POSITIVE ng/mL — AB (ref <2.5)
Oxycodone: 146.5 ng/mL — ABNORMAL HIGH (ref <2.5)
Oxymorphone: NEGATIVE ng/mL (ref <2.5)
Phencyclidine: NEGATIVE ng/mL (ref <10)
Tapentadol: NEGATIVE ng/mL (ref <5.0)
Tramadol: NEGATIVE ng/mL (ref <5.0)
Zolpidem: NEGATIVE ng/mL (ref <5.0)

## 2020-01-01 LAB — DRUG TOX ALC METAB W/CON, ORAL FLD: Alcohol Metabolite: NEGATIVE ng/mL (ref <25)

## 2020-01-02 ENCOUNTER — Telehealth: Payer: Self-pay | Admitting: *Deleted

## 2020-01-02 NOTE — Telephone Encounter (Signed)
Oral swab drug screen was consistent for prescribed medications.  ?

## 2020-01-17 ENCOUNTER — Ambulatory Visit (INDEPENDENT_AMBULATORY_CARE_PROVIDER_SITE_OTHER): Payer: Medicare Other | Admitting: Family Medicine

## 2020-01-18 ENCOUNTER — Ambulatory Visit (INDEPENDENT_AMBULATORY_CARE_PROVIDER_SITE_OTHER): Payer: Medicare Other | Admitting: Family Medicine

## 2020-01-24 ENCOUNTER — Other Ambulatory Visit: Payer: Self-pay | Admitting: Physical Medicine & Rehabilitation

## 2020-01-24 DIAGNOSIS — S8412XS Injury of peroneal nerve at lower leg level, left leg, sequela: Secondary | ICD-10-CM

## 2020-02-07 ENCOUNTER — Other Ambulatory Visit: Payer: Self-pay

## 2020-02-07 ENCOUNTER — Ambulatory Visit (INDEPENDENT_AMBULATORY_CARE_PROVIDER_SITE_OTHER): Payer: Medicare Other | Admitting: Family Medicine

## 2020-02-07 ENCOUNTER — Encounter (INDEPENDENT_AMBULATORY_CARE_PROVIDER_SITE_OTHER): Payer: Self-pay | Admitting: Family Medicine

## 2020-02-07 VITALS — BP 144/83 | HR 66 | Temp 98.2°F | Ht 66.0 in | Wt 269.0 lb

## 2020-02-07 DIAGNOSIS — E1159 Type 2 diabetes mellitus with other circulatory complications: Secondary | ICD-10-CM | POA: Diagnosis not present

## 2020-02-07 DIAGNOSIS — Z6841 Body Mass Index (BMI) 40.0 and over, adult: Secondary | ICD-10-CM | POA: Diagnosis not present

## 2020-02-07 DIAGNOSIS — G894 Chronic pain syndrome: Secondary | ICD-10-CM

## 2020-02-07 DIAGNOSIS — K5909 Other constipation: Secondary | ICD-10-CM

## 2020-02-07 DIAGNOSIS — E1169 Type 2 diabetes mellitus with other specified complication: Secondary | ICD-10-CM | POA: Diagnosis not present

## 2020-02-07 DIAGNOSIS — I152 Hypertension secondary to endocrine disorders: Secondary | ICD-10-CM

## 2020-02-07 DIAGNOSIS — E038 Other specified hypothyroidism: Secondary | ICD-10-CM

## 2020-02-07 DIAGNOSIS — Z794 Long term (current) use of insulin: Secondary | ICD-10-CM

## 2020-02-07 MED ORDER — OZEMPIC (1 MG/DOSE) 4 MG/3ML ~~LOC~~ SOPN: 1.0000 mg | PEN_INJECTOR | SUBCUTANEOUS | 0 refills | Status: DC

## 2020-02-08 NOTE — Progress Notes (Signed)
Chief Complaint:   OBESITY Katherine [name] is here to discuss her progress with her obesity treatment plan along with follow-up of her obesity related diagnoses.   Today's visit was #: 6 Starting weight: 293 lbs Starting date: 10/13/2019 Today's weight: 269 lbs Today's date: 02/07/2020 Total lbs lost to date: 24 lbs Body mass index is 43.42 kg/m.  Total weight loss percentage to date: -8.19%  Interim History: Katherine [name] says that she has increased her intake of carbs and salt.  She has been eating crackers with soup.  Her blood sugar high was 154, low 83.  She has started using [name].  Today's bioimpedance results indicate that Katherine [name] has gained 10 pounds of water weight since her last visit. Nutrition Plan: keeping a food journal and adhering to recommended goals of 1200-1500 calories and 95+ grams of protein.  Anti-obesity medications: Ozempic 1 mg weekly.  Activity: Walking for 5-15 minutes 3 times per week.  Assessment/Plan:   1. Hypertension associated with type 2 diabetes mellitus (Katherine [name]) Not at goal. Medications: HCTZ, lisinopril, Bystolic.  Katherine [name] has had some increased edema.   Plan: Decrease carbs/salt in diet.  Take full dose of HCTZ for 1 week.  Avoid buying foods that are: processed, frozen, or prepackaged to avoid excess salt. We will continue to monitor symptoms as they relate to her weight loss journey.  BP Readings from Last 3 Encounters:  02/07/20 (!) 144/83  12/28/19 123/79  12/27/19 103/65   Lab Results  Component Value Date   CREATININE 1.2 (A) 09/22/2019   2. Type 2 diabetes mellitus with other specified complication, with long-term current use of insulin (HCC) Diabetes Mellitus: needs improvement. Medication: Ozempic, Humalog, [name]. Issues reviewed with her: blood sugar goals, complications of diabetes mellitus, hypoglycemia prevention and treatment, exercise, and nutrition.  Lab Results  Component Value Date   HGBA1C 7.3 09/22/2019   HGBA1C 6.5  (H) 07/28/2018   HGBA1C 10.1 (H) 09/08/2012   Lab Results  Component Value Date   MICROALBUR 0.750 03/30/2019   LDLCALC 87 09/22/2019   CREATININE 1.2 (A) 09/22/2019   - Refill Semaglutide, 1 MG/DOSE, (OZEMPIC, 1 MG/DOSE,) 4 MG/3ML SOPN; Inject 1 mg into the skin once a week.  Dispense: 9 mL; Refill: 0  3. Other specified hypothyroidism Course: Stable. Medication: levothyroxine 25 mcg daily.   Lab Results  Component Value Date   TSH 3.93 09/22/2019   Plan: Patient was instructed not to take MVM or iron within 4 hours of taking thyroid medications. We will continue to monitor symptoms as they relate to her weight loss journey.  4. Chronic pain syndrome Katherine [name] is followed by Pain Management. We will continue to monitor symptoms as they relate to her weight loss journey.  5. Other constipation Getting to Good Bowel Health: Your goal is to have one soft bowel movement each day. Drink at least 8 glasses of water each day. Eat plenty of fiber (goal is over 25 grams each day). It is best to get most of your fiber from dietary sources which includes leafy green vegetables, fresh fruit, and whole grains. You may need to add fiber with the help of OTC fiber supplements. These include Metamucil, Citrucel, and Benefiber. If you are still having trouble, try adding Miralax or Magnesium Citrate. If all of these changes do not work, contact me.  6. Class 3 severe obesity with serious comorbidity and body mass index (BMI) of 40.0 to 44.9 in adult, unspecified obesity type (Katherine [name])  Course: Katherine [name] is currently  in the action stage of change. As such, her goal is to continue with weight loss efforts.   Nutrition goals: She has agreed to keeping a food journal and adhering to recommended goals of 1200 calories and 95 grams of protein.   Exercise goals: As is.  Behavioral modification strategies: increasing lean protein intake, decreasing simple carbohydrates, increasing vegetables and increasing  water intake.  Katherine [name] has agreed to follow-up with our clinic in 4 weeks. She was informed of the importance of frequent follow-up visits to maximize her success with intensive lifestyle modifications for her multiple health conditions.   Objective:   Blood pressure (!) 144/83, pulse 66, temperature 98.2 F (36.8 C), temperature source Oral, height 5' 6"  (1.676 m), weight 269 lb (122 kg), SpO2 95 %. Body mass index is 43.42 kg/m.  General: Cooperative, alert, well developed, in no acute distress. HEENT: Conjunctivae and lids unremarkable. Cardiovascular: Regular rhythm.  Lungs: Normal work of breathing. Neurologic: No focal deficits.   Lab Results  Component Value Date   CREATININE 1.2 (A) 09/22/2019   BUN 24 (A) 09/22/2019   NA 141 06/18/2019   K 3.9 06/18/2019   CL 106 06/18/2019   CO2 26 06/18/2019   Lab Results  Component Value Date   ALT 31 06/18/2019   AST 59 (H) 06/18/2019   ALKPHOS 86 06/18/2019   BILITOT 0.8 06/18/2019   Lab Results  Component Value Date   HGBA1C 7.3 09/22/2019   HGBA1C 6.5 (H) 07/28/2018   HGBA1C 10.1 (H) 09/08/2012   HGBA1C 10.1 (H) 09/07/2012   HGBA1C (H) 04/20/2010    5.7 (NOTE)                                                                       According to the ADA Clinical Practice Recommendations for 2011, when HbA1c is used as a screening test:   >=6.5%   Diagnostic of Diabetes Mellitus           (if abnormal result  is confirmed)  5.7-6.4%   Increased risk of developing Diabetes Mellitus  References:Diagnosis and Classification of Diabetes Mellitus,Diabetes WRUE,4540,98(JXBJY 1):S62-S69 and Standards of Medical Care in         Diabetes - 2011,Diabetes Care,2011,34  (Suppl 1):S11-S61.   Lab Results  Component Value Date   TSH 3.93 09/22/2019   Lab Results  Component Value Date   CHOL 162 09/22/2019   HDL 43 09/22/2019   LDLCALC 87 09/22/2019   TRIG 193 (A) 09/22/2019   CHOLHDL 2.8 08/19/2017   Lab Results  Component  Value Date   WBC 4.8 06/18/2019   HGB 13.8 06/18/2019   HCT 43.8 06/18/2019   MCV 83.3 06/18/2019   PLT PLATELET CLUMPS NOTED ON SMEAR, UNABLE TO ESTIMATE 06/18/2019   Lab Results  Component Value Date   IRON 39 08/19/2017   TIBC 404 08/19/2017   Obesity Behavioral Intervention:   Approximately 15 minutes were spent on the discussion below.  ASK: We discussed the diagnosis of obesity with Katherine [name] today and Katherine [name] agreed to give [name] permission to discuss obesity behavioral modification therapy today.  ASSESS: Katherine [name] has the diagnosis of obesity and her BMI today is 43.4. Katherine [name] is in the action stage of change.  ADVISE: Katherine [name] was educated on the multiple health risks of obesity as well as the benefit of weight loss to improve her health. She was advised of the need for long term treatment and the importance of lifestyle modifications to improve her current health and to decrease her risk of future health problems.  AGREE: Multiple dietary modification options and treatment options were discussed and Katherine [name] agreed to follow the recommendations documented in the above note.  ARRANGE: Katherine [name] was educated on the importance of frequent visits to treat obesity as outlined per CMS and USPSTF guidelines and agreed to schedule her next follow up appointment today.  Attestation Statements:   Reviewed by clinician on day of visit: allergies, medications, problem list, medical history, surgical history, family history, social history, and previous encounter notes.  I, Water quality scientist, CMA, am acting as transcriptionist for Briscoe Deutscher, DO  I have reviewed the above documentation for accuracy and completeness, and I agree with the above. Briscoe Deutscher, DO

## 2020-02-12 ENCOUNTER — Other Ambulatory Visit: Payer: Self-pay | Admitting: Registered Nurse

## 2020-02-12 DIAGNOSIS — M47816 Spondylosis without myelopathy or radiculopathy, lumbar region: Secondary | ICD-10-CM

## 2020-02-12 DIAGNOSIS — G894 Chronic pain syndrome: Secondary | ICD-10-CM

## 2020-02-21 ENCOUNTER — Telehealth (INDEPENDENT_AMBULATORY_CARE_PROVIDER_SITE_OTHER): Payer: Self-pay | Admitting: Family Medicine

## 2020-02-21 DIAGNOSIS — Z794 Long term (current) use of insulin: Secondary | ICD-10-CM

## 2020-02-21 DIAGNOSIS — E1169 Type 2 diabetes mellitus with other specified complication: Secondary | ICD-10-CM

## 2020-02-21 MED ORDER — OZEMPIC (1 MG/DOSE) 4 MG/3ML ~~LOC~~ SOPN: 1.0000 mg | PEN_INJECTOR | SUBCUTANEOUS | 0 refills | Status: DC

## 2020-02-21 NOTE — Telephone Encounter (Signed)
rx resent to pharmacy

## 2020-02-21 NOTE — Telephone Encounter (Signed)
Patient called to say her Ozempic hasn't been called in.  She used her last one 02/20/20.  She needs a refill called in.

## 2020-02-22 ENCOUNTER — Encounter: Payer: Medicare Other | Admitting: Registered Nurse

## 2020-03-09 ENCOUNTER — Encounter: Payer: Self-pay | Admitting: Registered Nurse

## 2020-03-09 ENCOUNTER — Other Ambulatory Visit: Payer: Self-pay

## 2020-03-09 ENCOUNTER — Encounter: Payer: Medicare Other | Attending: Registered Nurse | Admitting: Registered Nurse

## 2020-03-09 VITALS — BP 101/71 | HR 87 | Temp 98.5°F | Ht 66.0 in | Wt 271.0 lb

## 2020-03-09 DIAGNOSIS — S8412XS Injury of peroneal nerve at lower leg level, left leg, sequela: Secondary | ICD-10-CM

## 2020-03-09 DIAGNOSIS — Z79891 Long term (current) use of opiate analgesic: Secondary | ICD-10-CM | POA: Diagnosis not present

## 2020-03-09 DIAGNOSIS — G894 Chronic pain syndrome: Secondary | ICD-10-CM | POA: Diagnosis not present

## 2020-03-09 DIAGNOSIS — M47816 Spondylosis without myelopathy or radiculopathy, lumbar region: Secondary | ICD-10-CM | POA: Diagnosis not present

## 2020-03-09 DIAGNOSIS — R202 Paresthesia of skin: Secondary | ICD-10-CM | POA: Diagnosis not present

## 2020-03-09 DIAGNOSIS — Z5181 Encounter for therapeutic drug level monitoring: Secondary | ICD-10-CM

## 2020-03-09 MED ORDER — OXYCODONE HCL 10 MG PO TABS: 10.0000 mg | ORAL_TABLET | Freq: Three times a day (TID) | ORAL | 0 refills | Status: DC | PRN

## 2020-03-09 MED ORDER — GABAPENTIN 300 MG PO CAPS: 300.0000 mg | ORAL_CAPSULE | Freq: Two times a day (BID) | ORAL | 3 refills | Status: DC
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
## 2020-03-12 DIAGNOSIS — Z794 Long term (current) use of insulin: Secondary | ICD-10-CM | POA: Diagnosis not present

## 2020-03-12 DIAGNOSIS — E1165 Type 2 diabetes mellitus with hyperglycemia: Secondary | ICD-10-CM | POA: Diagnosis not present

## 2020-03-12 DIAGNOSIS — M810 Age-related osteoporosis without current pathological fracture: Secondary | ICD-10-CM | POA: Diagnosis not present

## 2020-03-12 LAB — COMPREHENSIVE METABOLIC PANEL
GFR calc Af Amer: 61
GFR calc non Af Amer: 50

## 2020-03-12 LAB — BASIC METABOLIC PANEL: Creatinine: 1.1 (ref 0.5–1.1)

## 2020-03-12 LAB — HEMOGLOBIN A1C: Hemoglobin A1C: 5.9

## 2020-03-12 LAB — VITAMIN D 1,25 DIHYDROXY: Vit D, 1,25-Dihydroxy: 38.7

## 2020-03-13 ENCOUNTER — Ambulatory Visit: Payer: Self-pay | Admitting: Family Medicine

## 2020-03-13 ENCOUNTER — Encounter (INDEPENDENT_AMBULATORY_CARE_PROVIDER_SITE_OTHER): Payer: Self-pay | Admitting: Family Medicine

## 2020-03-13 ENCOUNTER — Ambulatory Visit (INDEPENDENT_AMBULATORY_CARE_PROVIDER_SITE_OTHER): Payer: Medicare Other | Admitting: Family Medicine

## 2020-03-13 ENCOUNTER — Other Ambulatory Visit: Payer: Self-pay

## 2020-03-13 VITALS — BP 95/62 | HR 81 | Temp 98.3°F | Ht 66.0 in | Wt 266.0 lb

## 2020-03-13 DIAGNOSIS — Z794 Long term (current) use of insulin: Secondary | ICD-10-CM | POA: Diagnosis not present

## 2020-03-13 DIAGNOSIS — K5909 Other constipation: Secondary | ICD-10-CM | POA: Diagnosis not present

## 2020-03-13 DIAGNOSIS — E65 Localized adiposity: Secondary | ICD-10-CM

## 2020-03-13 DIAGNOSIS — E038 Other specified hypothyroidism: Secondary | ICD-10-CM

## 2020-03-13 DIAGNOSIS — Z6841 Body Mass Index (BMI) 40.0 and over, adult: Secondary | ICD-10-CM

## 2020-03-13 DIAGNOSIS — E1169 Type 2 diabetes mellitus with other specified complication: Secondary | ICD-10-CM

## 2020-03-14 MED ORDER — LINACLOTIDE 145 MCG PO CAPS: 145.0000 ug | ORAL_CAPSULE | Freq: Every day | ORAL | 0 refills | Status: DC

## 2020-03-15 NOTE — Progress Notes (Signed)
Chief Complaint:   OBESITY Amandalynn is here to discuss her progress with her obesity treatment plan along with follow-up of her obesity related diagnoses.   Today's visit was #: 7 Starting weight: 293 lbs Starting date: 10/13/2019 Today's weight: 266 lbs Today's date: 03/13/2020 Total lbs lost to date: 27 lbs Body mass index is 42.93 kg/m.  Total weight loss percentage to date: -9.22%  Interim History: Daisa sees Dr. [name] at [facility] for Endocrinology.   Goal:  Consistently drink 5 bottles of water.  Nutrition Plan: keeping a food journal and adhering to recommended goals of 1200 calories and 95 grams of protein.  Anti-obesity medications: Ozempic. Reported side effects: None. Activity: Walking for 25 minutes 4 times per week.  Assessment/Plan:   1. Chronic constipation Counseling Getting to Good Bowel Health: Your goal is to have one soft bowel movement each day. Drink at least 8 glasses of water each day. Eat plenty of fiber (goal is over 25 grams each day). It is best to get most of your fiber from dietary sources which includes leafy green vegetables, fresh fruit, and whole grains. You may need to add fiber with the help of OTC fiber supplements. These include Metamucil, Citrucel, and Benefiber. If you are still having trouble, try adding Miralax or Magnesium Citrate. If all of these changes do not work, contact me.  - Start linaclotide (LINZESS) 145 MCG CAPS capsule; Take 1 capsule (145 mcg total) by mouth daily before breakfast.  Dispense: 30 capsule; Refill: 0  2. Type 2 diabetes mellitus with other specified complication, with long-term current use of insulin [facility]) Recent labs with Endocrinology.   Lab Results  Component Value Date   HGBA1C 7.3 09/22/2019   HGBA1C 6.5 (H) 07/28/2018   HGBA1C 10.1 (H) 09/08/2012   Lab Results  Component Value Date   MICROALBUR 0.750 03/30/2019   LDLCALC 87 09/22/2019   CREATININE 1.2 (A) 09/22/2019   3. Visceral  obesity Current visceral fat rating: 19. Visceral fat rating should be < 13. Visceral adipose tissue is a hormonally active component of total body fat. This body composition phenotype is associated with medical disorders such as metabolic syndrome, cardiovascular disease and several malignancies including prostate, breast, and colorectal cancers. Starting goal: Lose 7-10% of starting weight.   4. Other specified hypothyroidism Medication: levothyroxine 25 mcg daily.   Lab Results  Component Value Date   TSH 3.93 09/22/2019   Plan: Patient was instructed not to take MVM or iron within 4 hours of taking thyroid medications. We will continue to monitor symptoms as they relate to her weight loss journey.  5. Class 3 severe obesity with serious comorbidity and body mass index (BMI) of 40.0 to 44.9 in adult, unspecified obesity type ([place])  Course: Lidya is currently in the action stage of change. As such, her goal is to continue with weight loss efforts.   Nutrition goals: She has agreed to keeping a food journal and adhering to recommended goals of 1200 calories and 95 grams of protein.   Exercise goals: As is.  Behavioral modification strategies: increasing lean protein intake, decreasing simple carbohydrates, increasing vegetables and increasing water intake.  Anupama has agreed to follow-up with our clinic in 3 weeks. She was informed of the importance of frequent follow-up visits to maximize her success with intensive lifestyle modifications for her multiple health conditions.   Objective:   Blood pressure 95/62, pulse 81, temperature 98.3 F (36.8 C), temperature source Oral, height 5' 6"  (1.676  m), weight 266 lb (120.7 kg), SpO2 94 %. Body mass index is 42.93 kg/m.  General: Cooperative, alert, well developed, in no acute distress. HEENT: Conjunctivae and lids unremarkable. Cardiovascular: Regular rhythm.  Lungs: Normal work of breathing. Neurologic: No focal deficits.    Lab Results  Component Value Date   CREATININE 1.2 (A) 09/22/2019   BUN 24 (A) 09/22/2019   NA 141 06/18/2019   K 3.9 06/18/2019   CL 106 06/18/2019   CO2 26 06/18/2019   Lab Results  Component Value Date   ALT 31 06/18/2019   AST 59 (H) 06/18/2019   ALKPHOS 86 06/18/2019   BILITOT 0.8 06/18/2019   Lab Results  Component Value Date   HGBA1C 7.3 09/22/2019   HGBA1C 6.5 (H) 07/28/2018   HGBA1C 10.1 (H) 09/08/2012   HGBA1C 10.1 (H) 09/07/2012   HGBA1C (H) 04/20/2010    5.7 (NOTE)                                                                       According to the ADA Clinical Practice Recommendations for 2011, when HbA1c is used as a screening test:   >=6.5%   Diagnostic of Diabetes Mellitus           (if abnormal result  is confirmed)  5.7-6.4%   Increased risk of developing Diabetes Mellitus  References:Diagnosis and Classification of Diabetes Mellitus,Diabetes IWPY,0998,33(ASNKN 1):S62-S69 and Standards of Medical Care in         Diabetes - 2011,Diabetes Care,2011,34  (Suppl 1):S11-S61.   No results found for: INSULIN Lab Results  Component Value Date   TSH 3.93 09/22/2019   Lab Results  Component Value Date   CHOL 162 09/22/2019   HDL 43 09/22/2019   LDLCALC 87 09/22/2019   TRIG 193 (A) 09/22/2019   CHOLHDL 2.8 08/19/2017   Lab Results  Component Value Date   WBC 4.8 06/18/2019   HGB 13.8 06/18/2019   HCT 43.8 06/18/2019   MCV 83.3 06/18/2019   PLT PLATELET CLUMPS NOTED ON SMEAR, UNABLE TO ESTIMATE 06/18/2019   Lab Results  Component Value Date   IRON 39 08/19/2017   TIBC 404 08/19/2017   Obesity Behavioral Intervention:   Approximately 15 minutes were spent on the discussion below.  ASK: We discussed the diagnosis of obesity with [name] today and [place] agreed to give [name] permission to discuss obesity behavioral modification therapy today.  ASSESS: Temisha has the diagnosis of obesity and her BMI today is 43.1. Kathyann is in the action  stage of change.   ADVISE: Yameli was educated on the multiple health risks of obesity as well as the benefit of weight loss to improve her health. She was advised of the need for long term treatment and the importance of lifestyle modifications to improve her current health and to decrease her risk of future health problems.  AGREE: Multiple dietary modification options and treatment options were discussed and Lucienne agreed to follow the recommendations documented in the above note.  ARRANGE: Taaliyah was educated on the importance of frequent visits to treat obesity as outlined per CMS and USPSTF guidelines and agreed to schedule her next follow up appointment today.  Attestation Statements:   Reviewed by clinician on day of  visit: allergies, medications, problem list, medical history, surgical history, family history, social history, and previous encounter notes.  I, Water quality scientist, CMA, am acting as transcriptionist for Briscoe Deutscher, DO  I have reviewed the above documentation for accuracy and completeness, and I agree with the above. Briscoe Deutscher, DO

## 2020-03-19 ENCOUNTER — Other Ambulatory Visit: Payer: Self-pay | Admitting: Registered Nurse

## 2020-03-19 DIAGNOSIS — M170 Bilateral primary osteoarthritis of knee: Secondary | ICD-10-CM

## 2020-03-19 DIAGNOSIS — M19172 Post-traumatic osteoarthritis, left ankle and foot: Secondary | ICD-10-CM

## 2020-03-19 DIAGNOSIS — G5712 Meralgia paresthetica, left lower limb: Secondary | ICD-10-CM

## 2020-04-03 ENCOUNTER — Encounter (INDEPENDENT_AMBULATORY_CARE_PROVIDER_SITE_OTHER): Payer: Self-pay | Admitting: Family Medicine

## 2020-04-03 ENCOUNTER — Other Ambulatory Visit: Payer: Self-pay

## 2020-04-03 ENCOUNTER — Ambulatory Visit (INDEPENDENT_AMBULATORY_CARE_PROVIDER_SITE_OTHER): Payer: Medicare Other | Admitting: Family Medicine

## 2020-04-03 ENCOUNTER — Other Ambulatory Visit (INDEPENDENT_AMBULATORY_CARE_PROVIDER_SITE_OTHER): Payer: Self-pay | Admitting: Family Medicine

## 2020-04-03 VITALS — BP 91/57 | HR 82 | Temp 97.9°F | Ht 66.0 in | Wt 254.0 lb

## 2020-04-03 DIAGNOSIS — E1159 Type 2 diabetes mellitus with other circulatory complications: Secondary | ICD-10-CM | POA: Diagnosis not present

## 2020-04-03 DIAGNOSIS — Z6841 Body Mass Index (BMI) 40.0 and over, adult: Secondary | ICD-10-CM

## 2020-04-03 DIAGNOSIS — I152 Hypertension secondary to endocrine disorders: Secondary | ICD-10-CM | POA: Diagnosis not present

## 2020-04-03 DIAGNOSIS — K5909 Other constipation: Secondary | ICD-10-CM

## 2020-04-03 DIAGNOSIS — Z9189 Other specified personal risk factors, not elsewhere classified: Secondary | ICD-10-CM | POA: Diagnosis not present

## 2020-04-03 DIAGNOSIS — Z794 Long term (current) use of insulin: Secondary | ICD-10-CM

## 2020-04-03 DIAGNOSIS — E1169 Type 2 diabetes mellitus with other specified complication: Secondary | ICD-10-CM | POA: Diagnosis not present

## 2020-04-03 DIAGNOSIS — E119 Type 2 diabetes mellitus without complications: Secondary | ICD-10-CM | POA: Insufficient documentation

## 2020-04-03 MED ORDER — OZEMPIC (1 MG/DOSE) 4 MG/3ML ~~LOC~~ SOPN: 1.0000 mg | PEN_INJECTOR | SUBCUTANEOUS | 0 refills | Status: DC

## 2020-04-03 NOTE — Telephone Encounter (Signed)
Last OV with Dr Wallace 

## 2020-04-04 NOTE — Progress Notes (Signed)
Chief Complaint:   OBESITY Katherine [name] is here to discuss her progress with her obesity treatment plan along with follow-up of her obesity related diagnoses.   Today's visit was #: 8 Starting weight: 293 lbs Starting date: 10/13/2019 Today's weight: 254 lbs Today's date: 04/03/2020 Total lbs lost to date: 39 lbs Body mass index is 41 kg/m.  Total weight loss percentage to date: -13.31%  Interim History: Katherine [name] has not been eating breakfast or lunch, and instead only drinks 1 Premier Protein shake for each meal.  For dinner, she eats out most of the time.  She is not getting in protein at dinner at all.  She is not journaling her food intake.  Katherine [name] is here for a follow up office visit.  she is following the meal plan without concern or issues.  Patient's meal and food recall appears to be accurate and consistent with what is on the plan.  When on plan, her hunger and cravings are well controlled.    Nutrition Plan: keeping a food journal and adhering to recommended goals of 1200 calories and 95 grams of protein.   Anti-obesity medications: Ozempic 1 mg subcutaneously weekly. Reported side effects: None. Activity: Walking for 15 minutes 4 times per week.  Assessment/Plan:   Medications Discontinued During This Encounter  Medication Reason  . hydrochlorothiazide (HYDRODIURIL) 25 MG tablet Discontinued by provider  . Semaglutide, 1 MG/DOSE, (OZEMPIC, 1 MG/DOSE,) 4 MG/3ML SOPN Reorder     Meds ordered this encounter  Medications  . Semaglutide, 1 MG/DOSE, (OZEMPIC, 1 MG/DOSE,) 4 MG/3ML SOPN    Sig: Inject 1 mg into the skin once a week.    Dispense:  9 mL    Refill:  0     1. Hypertension associated with type 2 diabetes mellitus (HCC) Medications: lisinopril 20 mg daily, Bystolic 10 mg daily, and HCTZ.  She has not been checking at home says she can borrow a meter.  She denies dizziness/lightheadedness.  Plan:  I told her to discontinue her HCTZ today due to  blood pressure of 95/62 at last visit and 91/57 today.  She will check her blood pressure daily and follow-up here in 2 weeks with her log.   BP Readings from Last 3 Encounters:  04/03/20 (!) 91/57  03/13/20 95/62  03/09/20 101/71   Lab Results  Component Value Date   CREATININE 1.2 (A) 09/22/2019   2. Type 2 diabetes mellitus with other specified complication, with long-term current use of insulin (HCC) Medication: Ozempic 1 mg subcutaneously weekly, [name], Humalog. FBS 82-130.  Denies symptoms.  Management per Endocrinology, Dr. [name].  Recently obtained A1c, but she does not recall the value.  Issues reviewed: blood sugar goals, complications of diabetes mellitus, hypoglycemia prevention and treatment, exercise, and nutrition.   Plan:  Katherine [name] says that Dr. [name] did not make any changes recently.  The importance of regular follow up with PCP and all other specialists as scheduled was stressed to patient today.  Will refill Ozempic, as per below.  Lab Results  Component Value Date   HGBA1C 7.3 09/22/2019   HGBA1C 6.5 (H) 07/28/2018   HGBA1C 10.1 (H) 09/08/2012   Lab Results  Component Value Date   MICROALBUR 0.750 03/30/2019   LDLCALC 87 09/22/2019   CREATININE 1.2 (A) 09/22/2019   - Refill Semaglutide, 1 MG/DOSE, (OZEMPIC, 1 MG/DOSE,) 4 MG/3ML SOPN; Inject 1 mg into the skin once a week.  Dispense: 9 mL; Refill: 0  3. At risk  for malnutrition Katherine [name] was given extensive malnutrition prevention education and counseling today of more than 10 minutes.  Counseled her that malnutrition refers to inappropriate nutrients or not the right balance of nutrients for optimal health.  Discussed with Katherine [name] that it is absolutely possible to be malnourished but yet obese.  Risk factors, including but not limited to, inappropriate dietary choices, difficulty with obtaining food due to physical or financial limitations, and various physical and mental health conditions were reviewed  with Katherine [name].   4. Class 3 severe obesity with serious comorbidity and body mass index (BMI) of 40.0 to 44.9 in adult, unspecified obesity type ([place])  Course: Katherine [name] is currently in the action stage of change. As such, her goal is to continue with weight loss efforts.   Nutrition goals: She has agreed to keeping a food journal and adhering to recommended goals of 1200 calories and 95 grams of protein.   Exercise goals: As is.  Behavioral modification strategies: increasing lean protein intake, increasing water intake, decreasing eating out and no skipping meals.  Katherine [name] has agreed to follow-up with our clinic in 2 weeks. She was informed of the importance of frequent follow-up visits to maximize her success with intensive lifestyle modifications for her multiple health conditions.   Objective:   Blood pressure (!) 91/57, pulse 82, temperature 97.9 F (36.6 C), height 5' 6"  (1.676 m), weight 254 lb (115.2 kg), SpO2 91 %. Body mass index is 41 kg/m.  General: Cooperative, alert, well developed, in no acute distress. HEENT: Conjunctivae and lids unremarkable. Cardiovascular: Regular rhythm.  Lungs: Normal work of breathing. Neurologic: No focal deficits.   Lab Results  Component Value Date   CREATININE 1.2 (A) 09/22/2019   BUN 24 (A) 09/22/2019   NA 141 06/18/2019   K 3.9 06/18/2019   CL 106 06/18/2019   CO2 26 06/18/2019   Lab Results  Component Value Date   ALT 31 06/18/2019   AST 59 (H) 06/18/2019   ALKPHOS 86 06/18/2019   BILITOT 0.8 06/18/2019   Lab Results  Component Value Date   HGBA1C 7.3 09/22/2019   HGBA1C 6.5 (H) 07/28/2018   HGBA1C 10.1 (H) 09/08/2012   HGBA1C 10.1 (H) 09/07/2012   HGBA1C (H) 04/20/2010    5.7 (NOTE)                                                                       According to the ADA Clinical Practice Recommendations for 2011, when HbA1c is used as a screening test:   >=6.5%   Diagnostic of Diabetes Mellitus            (if abnormal result  is confirmed)  5.7-6.4%   Increased risk of developing Diabetes Mellitus  References:Diagnosis and Classification of Diabetes Mellitus,Diabetes TGGY,6948,54(OEVOJ 1):S62-S69 and Standards of Medical Care in         Diabetes - 2011,Diabetes Care,2011,34  (Suppl 1):S11-S61.   Lab Results  Component Value Date   TSH 3.93 09/22/2019   Lab Results  Component Value Date   CHOL 162 09/22/2019   HDL 43 09/22/2019   LDLCALC 87 09/22/2019   TRIG 193 (A) 09/22/2019   CHOLHDL 2.8 08/19/2017   Lab Results  Component Value Date  WBC 4.8 06/18/2019   HGB 13.8 06/18/2019   HCT 43.8 06/18/2019   MCV 83.3 06/18/2019   PLT PLATELET CLUMPS NOTED ON SMEAR, UNABLE TO ESTIMATE 06/18/2019   Lab Results  Component Value Date   IRON 39 08/19/2017   TIBC 404 08/19/2017   Attestation Statements:   Reviewed by clinician on day of visit: allergies, medications, problem list, medical history, surgical history, family history, social history, and previous encounter notes.  I, Water quality scientist, CMA, am acting as Location manager for [employer], DO.  I have reviewed the above documentation for accuracy and completeness, and I agree with the above. Marjory Sneddon, D.O.  The [place] was signed into law in 2016 which includes the topic of electronic health records.  This provides immediate access to information in MyChart.  This includes consultation notes, operative notes, office notes, lab results and pathology reports.  If you have any questions about what you read please let us know at your next visit so we can discuss your concerns and take corrective action if need be.  We are right here with you.

## 2020-04-16 ENCOUNTER — Other Ambulatory Visit: Payer: Self-pay

## 2020-04-16 ENCOUNTER — Encounter (INDEPENDENT_AMBULATORY_CARE_PROVIDER_SITE_OTHER): Payer: Self-pay | Admitting: Family Medicine

## 2020-04-16 ENCOUNTER — Ambulatory Visit (INDEPENDENT_AMBULATORY_CARE_PROVIDER_SITE_OTHER): Payer: Medicare Other | Admitting: Family Medicine

## 2020-04-16 VITALS — BP 127/78 | HR 76 | Temp 97.4°F | Ht 66.0 in | Wt 264.0 lb

## 2020-04-16 DIAGNOSIS — Z794 Long term (current) use of insulin: Secondary | ICD-10-CM | POA: Diagnosis not present

## 2020-04-16 DIAGNOSIS — K5909 Other constipation: Secondary | ICD-10-CM

## 2020-04-16 DIAGNOSIS — E1169 Type 2 diabetes mellitus with other specified complication: Secondary | ICD-10-CM

## 2020-04-16 DIAGNOSIS — Z6841 Body Mass Index (BMI) 40.0 and over, adult: Secondary | ICD-10-CM | POA: Diagnosis not present

## 2020-04-16 DIAGNOSIS — I10 Essential (primary) hypertension: Secondary | ICD-10-CM | POA: Diagnosis not present

## 2020-04-18 DIAGNOSIS — N182 Chronic kidney disease, stage 2 (mild): Secondary | ICD-10-CM | POA: Diagnosis not present

## 2020-04-18 DIAGNOSIS — K219 Gastro-esophageal reflux disease without esophagitis: Secondary | ICD-10-CM | POA: Diagnosis not present

## 2020-04-18 DIAGNOSIS — E1121 Type 2 diabetes mellitus with diabetic nephropathy: Secondary | ICD-10-CM | POA: Diagnosis not present

## 2020-04-18 DIAGNOSIS — R809 Proteinuria, unspecified: Secondary | ICD-10-CM | POA: Diagnosis not present

## 2020-04-18 DIAGNOSIS — Z0000 Encounter for general adult medical examination without abnormal findings: Secondary | ICD-10-CM | POA: Diagnosis not present

## 2020-04-18 DIAGNOSIS — K7460 Unspecified cirrhosis of liver: Secondary | ICD-10-CM | POA: Diagnosis not present

## 2020-04-18 DIAGNOSIS — E039 Hypothyroidism, unspecified: Secondary | ICD-10-CM | POA: Diagnosis not present

## 2020-04-18 DIAGNOSIS — G8929 Other chronic pain: Secondary | ICD-10-CM | POA: Diagnosis not present

## 2020-04-18 DIAGNOSIS — I129 Hypertensive chronic kidney disease with stage 1 through stage 4 chronic kidney disease, or unspecified chronic kidney disease: Secondary | ICD-10-CM | POA: Diagnosis not present

## 2020-04-18 DIAGNOSIS — E782 Mixed hyperlipidemia: Secondary | ICD-10-CM | POA: Diagnosis not present

## 2020-04-18 MED ORDER — LISINOPRIL-HYDROCHLOROTHIAZIDE 10-12.5 MG PO TABS: 1.0000 | ORAL_TABLET | Freq: Every day | ORAL | 0 refills | Status: DC

## 2020-04-18 MED ORDER — OZEMPIC (1 MG/DOSE) 4 MG/3ML ~~LOC~~ SOPN: 1.0000 mg | PEN_INJECTOR | SUBCUTANEOUS | 0 refills | Status: DC

## 2020-04-18 MED ORDER — LINACLOTIDE 145 MCG PO CAPS: 145.0000 ug | ORAL_CAPSULE | Freq: Every day | ORAL | 0 refills | Status: DC

## 2020-04-19 LAB — COMPREHENSIVE METABOLIC PANEL
Albumin: 2.8 — AB (ref 3.5–5.0)
Calcium: 9.3 (ref 8.7–10.7)
GFR calc Af Amer: 80
GFR calc non Af Amer: 66

## 2020-04-19 LAB — HEPATIC FUNCTION PANEL
ALT: 25 (ref 7–35)
AST: 40 — AB (ref 13–35)
Bilirubin, Total: 0.5

## 2020-04-19 LAB — BASIC METABOLIC PANEL
BUN: 26 — AB (ref 4–21)
CO2: 31 — AB (ref 13–22)
Chloride: 106 (ref 99–108)
Creatinine: 0.9 (ref 0.5–1.1)
Glucose: 93
Potassium: 4.6 (ref 3.4–5.3)
Sodium: 142 (ref 137–147)

## 2020-04-19 LAB — MICROALBUMIN, URINE: Microalb, Ur: 2.86

## 2020-04-19 NOTE — Progress Notes (Signed)
Chief Complaint:   OBESITY Katherine [name] is here to discuss her progress with her obesity treatment plan along with follow-up of her obesity related diagnoses.   Today's visit was #: 9 Starting weight: 293 lbs Starting date: 10/13/2019 Today's weight: 264 lbs Today's date: 04/16/2020 Total lbs lost to date: 29 lbs Body mass index is 42.61 kg/m.  Total weight loss percentage to date: -9.90%  Interim History: Katherine [name] says her well caved in and she has had no water in 2 months.  She has been eating out a lot (gave Eating Out Guide).  Blood sugars have been 116, 92, 96.  Plan:  Shakes, yogurt, and fruit at home.  Nutrition Plan: keeping a food journal and adhering to recommended goals of 1200-1500 calories and 95 grams of protein for 75% of the time. Anti-obesity medications: Ozempic. Reported side effects: None. Activity: Walking for 20 minutes 3-4 times per week.  Assessment/Plan:   1. Essential hypertension Blood pressure at goal, but increased edema. Medications: Bystolic 10 mg daily and lisinopril 40 mg daily.   Plan: Avoid buying foods that are: processed, frozen, or prepackaged to avoid excess salt. We will continue to monitor closely alongside her PCP and/or Specialist.  Regular follow up with PCP and specialists was also encouraged.  Start lisinopril-HCTZ 10-12.5 mg daily, as per below.  BP Readings from Last 3 Encounters:  04/16/20 127/78  04/03/20 (!) 91/57  03/13/20 95/62   Lab Results  Component Value Date   CREATININE 1.2 (A) 09/22/2019   - Start lisinopril-hydrochlorothiazide (ZESTORETIC) 10-12.5 MG tablet; Take 1 tablet by mouth daily.  Dispense: 90 tablet; Refill: 0  2. Chronic constipation She has been unable to take Linzess due to not having water.  Plan:  Will refill Linzess today, as per below.  - Refill linaclotide (LINZESS) 145 MCG CAPS capsule; Take 1 capsule (145 mcg total) by mouth daily before breakfast.  Dispense: 30 capsule; Refill: 0  3.  Type 2 diabetes mellitus with other specified complication, with long-term current use of insulin (HCC) Diabetes Mellitus: Not at goal. Medication:  Ozempic 1 mg subcutaneously weekly, [name], Humalog. Issues reviewed: blood sugar goals, complications of diabetes mellitus, hypoglycemia prevention and treatment, exercise, and nutrition.   Plan:  Continue medications.  Will refill Ozempic today.  The patient was encouraged to monitor blood sugars regularly and to bring their log to the next appointment for review.  The importance of regular follow up with PCP and all other specialists as scheduled was stressed to patient today.  Lab Results  Component Value Date   HGBA1C 7.3 09/22/2019   HGBA1C 6.5 (H) 07/28/2018   HGBA1C 10.1 (H) 09/08/2012   Lab Results  Component Value Date   MICROALBUR 0.750 03/30/2019   LDLCALC 87 09/22/2019   CREATININE 1.2 (A) 09/22/2019   - Refill Semaglutide, 1 MG/DOSE, (OZEMPIC, 1 MG/DOSE,) 4 MG/3ML SOPN; Inject 1 mg into the skin once a week.  Dispense: 9 mL; Refill: 0  4. Class 3 severe obesity with serious comorbidity and body mass index (BMI) of 40.0 to 44.9 in adult, unspecified obesity type (Katherine [name])  Course: Katherine [name] is currently in the action stage of change. As such, her goal is to continue with weight loss efforts.   Nutrition goals: She has agreed to keeping a food journal and adhering to recommended goals of 1200-1500 calories and 95 grams of protein.   Exercise goals: As is.  Behavioral modification strategies: increasing lean protein intake, decreasing simple carbohydrates, increasing vegetables and  increasing water intake.  Katherine [name] has agreed to follow-up with our clinic in 3 weeks. She was informed of the importance of frequent follow-up visits to maximize her success with intensive lifestyle modifications for her multiple health conditions.   Objective:   Blood pressure 127/78, pulse 76, temperature (!) 97.4 F (36.3 C), temperature source  Oral, height 5' 6"  (1.676 m), weight 264 lb (119.7 kg), SpO2 93 %. Body mass index is 42.61 kg/m.  General: Cooperative, alert, well developed, in no acute distress. HEENT: Conjunctivae and lids unremarkable. Cardiovascular: Regular rhythm.  Lungs: Normal work of breathing. Neurologic: No focal deficits.   Lab Results  Component Value Date   CREATININE 1.2 (A) 09/22/2019   BUN 24 (A) 09/22/2019   NA 141 06/18/2019   K 3.9 06/18/2019   CL 106 06/18/2019   CO2 26 06/18/2019   Lab Results  Component Value Date   ALT 31 06/18/2019   AST 59 (H) 06/18/2019   ALKPHOS 86 06/18/2019   BILITOT 0.8 06/18/2019   Lab Results  Component Value Date   HGBA1C 7.3 09/22/2019   HGBA1C 6.5 (H) 07/28/2018   HGBA1C 10.1 (H) 09/08/2012   HGBA1C 10.1 (H) 09/07/2012   HGBA1C (H) 04/20/2010    5.7 (NOTE)                                                                       According to the ADA Clinical Practice Recommendations for 2011, when HbA1c is used as a screening test:   >=6.5%   Diagnostic of Diabetes Mellitus           (if abnormal result  is confirmed)  5.7-6.4%   Increased risk of developing Diabetes Mellitus  References:Diagnosis and Classification of Diabetes Mellitus,Diabetes UTML,4650,35(WSFKC 1):S62-S69 and Standards of Medical Care in         Diabetes - 2011,Diabetes Care,2011,34  (Suppl 1):S11-S61.   Lab Results  Component Value Date   TSH 3.93 09/22/2019   Lab Results  Component Value Date   CHOL 162 09/22/2019   HDL 43 09/22/2019   LDLCALC 87 09/22/2019   TRIG 193 (A) 09/22/2019   CHOLHDL 2.8 08/19/2017   Lab Results  Component Value Date   WBC 4.8 06/18/2019   HGB 13.8 06/18/2019   HCT 43.8 06/18/2019   MCV 83.3 06/18/2019   PLT PLATELET CLUMPS NOTED ON SMEAR, UNABLE TO ESTIMATE 06/18/2019   Lab Results  Component Value Date   IRON 39 08/19/2017   TIBC 404 08/19/2017   Obesity Behavioral Intervention:   Approximately 15 minutes were spent on the discussion  below.  ASK: We discussed the diagnosis of obesity with Katherine [name] today and Katherine [name] agreed to give [name] permission to discuss obesity behavioral modification therapy today.  ASSESS: Katherine [name] has the diagnosis of obesity and her BMI today is 42.7. Katherine [name] is in the action stage of change.   ADVISE: Katherine [name] was educated on the multiple health risks of obesity as well as the benefit of weight loss to improve her health. She was advised of the need for long term treatment and the importance of lifestyle modifications to improve her current health and to decrease her risk of future health problems.  AGREE: Multiple dietary modification options and treatment options  were discussed and Katherine [name] agreed to follow the recommendations documented in the above note.  ARRANGE: Katherine [name] was educated on the importance of frequent visits to treat obesity as outlined per CMS and USPSTF guidelines and agreed to schedule her next follow up appointment today.  Attestation Statements:   Reviewed by clinician on day of visit: allergies, medications, problem list, medical history, surgical history, family history, social history, and previous encounter notes.  I, Water quality scientist, CMA, am acting as transcriptionist for Briscoe Deutscher, DO  I have reviewed the above documentation for accuracy and completeness, and I agree with the above. Briscoe Deutscher, DO

## 2020-04-24 ENCOUNTER — Other Ambulatory Visit: Payer: Self-pay | Admitting: Nurse Practitioner

## 2020-04-24 ENCOUNTER — Other Ambulatory Visit (INDEPENDENT_AMBULATORY_CARE_PROVIDER_SITE_OTHER): Payer: Self-pay | Admitting: Family Medicine

## 2020-04-24 DIAGNOSIS — K7469 Other cirrhosis of liver: Secondary | ICD-10-CM

## 2020-04-24 DIAGNOSIS — E1169 Type 2 diabetes mellitus with other specified complication: Secondary | ICD-10-CM

## 2020-04-24 DIAGNOSIS — K7581 Nonalcoholic steatohepatitis (NASH): Secondary | ICD-10-CM | POA: Diagnosis not present

## 2020-04-24 DIAGNOSIS — Z794 Long term (current) use of insulin: Secondary | ICD-10-CM

## 2020-04-24 NOTE — Telephone Encounter (Signed)
Dr.Wallace °

## 2020-04-25 ENCOUNTER — Other Ambulatory Visit: Payer: Self-pay | Admitting: Physical Medicine & Rehabilitation

## 2020-04-25 ENCOUNTER — Telehealth: Payer: Self-pay | Admitting: Registered Nurse

## 2020-04-25 DIAGNOSIS — M7121 Synovial cyst of popliteal space [Baker], right knee: Secondary | ICD-10-CM

## 2020-04-25 NOTE — Telephone Encounter (Signed)
Katherine [name] return the call , she's not taking the diclofenac tablet only using the voltaren gel. Diclofenac tablet discontinued, she verbalizes understanding.

## 2020-04-25 NOTE — Telephone Encounter (Signed)
  Placed a call to Ms. Licea regarding the Diclofenac. No answer, awaiting a return call.

## 2020-04-30 DIAGNOSIS — K7460 Unspecified cirrhosis of liver: Secondary | ICD-10-CM | POA: Diagnosis not present

## 2020-05-01 ENCOUNTER — Other Ambulatory Visit: Payer: Self-pay | Admitting: Gastroenterology

## 2020-05-02 ENCOUNTER — Other Ambulatory Visit (INDEPENDENT_AMBULATORY_CARE_PROVIDER_SITE_OTHER): Payer: Self-pay | Admitting: Family Medicine

## 2020-05-08 ENCOUNTER — Other Ambulatory Visit: Payer: Self-pay

## 2020-05-08 ENCOUNTER — Encounter: Payer: Self-pay | Admitting: Registered Nurse

## 2020-05-08 ENCOUNTER — Ambulatory Visit
Admission: RE | Admit: 2020-05-08 | Discharge: 2020-05-08 | Disposition: A | Discharge status: Home / Self Care | Payer: Medicare Other | Source: Ambulatory Visit | Attending: Registered Nurse | Admitting: Registered Nurse

## 2020-05-08 ENCOUNTER — Encounter: Payer: Medicare Other | Attending: Registered Nurse | Admitting: Registered Nurse

## 2020-05-08 VITALS — BP 118/77 | HR 77 | Temp 98.8°F | Ht 66.0 in | Wt 263.0 lb

## 2020-05-08 DIAGNOSIS — W108XXS Fall (on) (from) other stairs and steps, sequela: Secondary | ICD-10-CM | POA: Diagnosis not present

## 2020-05-08 DIAGNOSIS — M47816 Spondylosis without myelopathy or radiculopathy, lumbar region: Secondary | ICD-10-CM

## 2020-05-08 DIAGNOSIS — M545 Low back pain, unspecified: Secondary | ICD-10-CM | POA: Diagnosis not present

## 2020-05-08 DIAGNOSIS — G894 Chronic pain syndrome: Secondary | ICD-10-CM | POA: Diagnosis not present

## 2020-05-08 DIAGNOSIS — G8929 Other chronic pain: Secondary | ICD-10-CM

## 2020-05-08 DIAGNOSIS — S8412XS Injury of peroneal nerve at lower leg level, left leg, sequela: Secondary | ICD-10-CM | POA: Diagnosis not present

## 2020-05-08 DIAGNOSIS — R202 Paresthesia of skin: Secondary | ICD-10-CM | POA: Diagnosis not present

## 2020-05-08 DIAGNOSIS — M25561 Pain in right knee: Secondary | ICD-10-CM | POA: Insufficient documentation

## 2020-05-08 DIAGNOSIS — M1711 Unilateral primary osteoarthritis, right knee: Secondary | ICD-10-CM | POA: Diagnosis not present

## 2020-05-08 DIAGNOSIS — Z5181 Encounter for therapeutic drug level monitoring: Secondary | ICD-10-CM | POA: Diagnosis not present

## 2020-05-08 DIAGNOSIS — Z79891 Long term (current) use of opiate analgesic: Secondary | ICD-10-CM | POA: Diagnosis not present

## 2020-05-08 MED ORDER — METHYLPREDNISOLONE 4 MG PO TBPK: ORAL_TABLET | ORAL | 0 refills | Status: DC

## 2020-05-08 MED ORDER — OXYCODONE HCL 10 MG PO TABS: 10.0000 mg | ORAL_TABLET | Freq: Three times a day (TID) | ORAL | 0 refills | Status: DC | PRN
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
## 2020-05-09 ENCOUNTER — Other Ambulatory Visit: Payer: Medicare Other

## 2020-05-09 ENCOUNTER — Other Ambulatory Visit: Payer: Self-pay

## 2020-05-09 ENCOUNTER — Encounter (INDEPENDENT_AMBULATORY_CARE_PROVIDER_SITE_OTHER): Payer: Self-pay | Admitting: Family Medicine

## 2020-05-09 ENCOUNTER — Telehealth: Payer: Self-pay | Admitting: Registered Nurse

## 2020-05-09 ENCOUNTER — Ambulatory Visit (INDEPENDENT_AMBULATORY_CARE_PROVIDER_SITE_OTHER): Payer: Medicare Other | Admitting: Family Medicine

## 2020-05-09 VITALS — BP 151/77 | HR 75 | Temp 98.5°F | Ht 66.0 in | Wt 259.0 lb

## 2020-05-09 DIAGNOSIS — K7469 Other cirrhosis of liver: Secondary | ICD-10-CM

## 2020-05-09 DIAGNOSIS — Z794 Long term (current) use of insulin: Secondary | ICD-10-CM | POA: Diagnosis not present

## 2020-05-09 DIAGNOSIS — Z6841 Body Mass Index (BMI) 40.0 and over, adult: Secondary | ICD-10-CM

## 2020-05-09 DIAGNOSIS — I10 Essential (primary) hypertension: Secondary | ICD-10-CM

## 2020-05-09 DIAGNOSIS — E1169 Type 2 diabetes mellitus with other specified complication: Secondary | ICD-10-CM | POA: Diagnosis not present

## 2020-05-09 DIAGNOSIS — K5909 Other constipation: Secondary | ICD-10-CM

## 2020-05-09 MED ORDER — OZEMPIC (1 MG/DOSE) 4 MG/3ML ~~LOC~~ SOPN: 1.0000 mg | PEN_INJECTOR | SUBCUTANEOUS | 0 refills | Status: DC

## 2020-05-09 NOTE — Telephone Encounter (Signed)
Placed a call to Ms. Sallee Provencal, we discussed her Xrays. She will call her orthopedist, she was instructed to send a My- Chart message with update, she verbalizes understanding.

## 2020-05-10 MED ORDER — LINACLOTIDE 145 MCG PO CAPS: 145.0000 ug | ORAL_CAPSULE | Freq: Every day | ORAL | 0 refills | Status: DC

## 2020-05-15 NOTE — Progress Notes (Signed)
Chief Complaint:   OBESITY Katherine [name] is here to discuss her progress with her obesity treatment plan along with follow-up of her obesity related diagnoses.   Today's visit was #: 10 Starting weight: 293 lbs Starting date: 10/13/2019 Today's weight: 259 lbs Today's date: 05/09/2020 Total lbs lost to date: 34 lbs Body mass index is 41.8 kg/m.  Total weight loss percentage to date: -11.60%  Interim History:  Katherine [name]'s A1c is 5.9.  She says she is using the bike or the treadmill for exercise and that she is not drinking enough water.  She reports having an increased desire to eat at night.  Current Meal Plan: keeping a food journal and adhering to recommended goals of 1200-1500 calories and 95 grams of protein for 20% of the time.  Current Exercise Plan: Cardio for 15-25 minutes 3 times per week. Current Anti-Obesity Medications: Ozempic 1 mg subcutaneously weekly. Side effects: None.  Assessment/Plan:   1. Type 2 diabetes mellitus with other specified complication, with long-term current use of insulin (HCC) Diabetes Mellitus: Controlled. Medication: Ozempic 1 mg subcutaneously weekly, [name] - rec 10 daily. Issues reviewed: blood sugar goals, complications of diabetes mellitus, hypoglycemia prevention and treatment, exercise, and nutrition.   Plan: The importance of regular follow up with PCP and all other specialists as scheduled was stressed to patient today.  Lab Results  Component Value Date   HGBA1C 5.9 03/12/2020   HGBA1C 7.3 09/22/2019   HGBA1C 6.5 (H) 07/28/2018   Lab Results  Component Value Date   MICROALBUR 2.86 04/19/2020   LDLCALC 29 12/13/2019   CREATININE 0.9 04/19/2020   - Refill Semaglutide, 1 MG/DOSE, (OZEMPIC, 1 MG/DOSE,) 4 MG/3ML SOPN; Inject 1 mg into the skin once a week.  Dispense: 9 mL; Refill: 0  2. Chronic constipation This problem is not controlled. Katherine [name] was informed that a decrease in bowel movement frequency is normal while losing  weight, but stools should not be hard or painful.  Counseling: Getting to Good Bowel Health: Your goal is to have one soft bowel movement each day. Drink at least 8 glasses of water each day. Eat plenty of fiber (goal is over 30 grams each day). It is best to get most of your fiber from dietary sources which includes leafy green vegetables, fresh fruit, and whole grains. You may need to add fiber with the help of OTC fiber supplements. These include Metamucil, Citrucel, and Benefiber. If you are still having trouble, try adding an osmotic laxative such as Miralax. If all of these changes do not work, Cabin crew.   - Refill linaclotide (LINZESS) 145 MCG CAPS capsule; Take 1 capsule (145 mcg total) by mouth daily before breakfast.  Dispense: 90 capsule; Refill: 0  3. Other cirrhosis of liver ([place]) Katherine [name] is scheduled for an upcoming EGD to evaluate/monitor varices. We will continue to monitor symptoms as they relate to her weight loss journey.  4. Essential hypertension Elevated slightly today.  Medications: HCTZ 12.5 mg daily, Zestoretic 33-54.5 mg daily, Bystolic 10 mg daily.   Plan:  Decrease dietary salt. We will continue to monitor closely alongside her PCP and/or Specialist.  Regular follow up with PCP and specialists was also encouraged.   BP Readings from Last 3 Encounters:  05/09/20 (!) 151/77  05/08/20 118/77  04/16/20 127/78   Lab Results  Component Value Date   CREATININE 0.9 04/19/2020   5. Class 3 severe obesity with serious comorbidity and body mass index (BMI) of 40.0 to 44.9  in adult, unspecified obesity type [facility])  Course: Katherine [name] is currently in the action stage of change. As such, her goal is to continue with weight loss efforts.   Nutrition goals: She has agreed to keeping a food journal and adhering to recommended goals of 1200-1500 calories and 95 grams of protein.   Exercise goals: As is.  Behavioral modification strategies: increasing lean protein  intake, decreasing simple carbohydrates, increasing vegetables and decreasing sodium intake.  Katherine [name] has agreed to follow-up with our clinic in 3 weeks. She was informed of the importance of frequent follow-up visits to maximize her success with intensive lifestyle modifications for her multiple health conditions.   Objective:   Blood pressure (!) 151/77, pulse 75, temperature 98.5 F (36.9 C), temperature source Oral, height 5' 6"  (1.676 m), weight 259 lb (117.5 kg), SpO2 95 %. Body mass index is 41.8 kg/m.  General: Cooperative, alert, well developed, in no acute distress. HEENT: Conjunctivae and lids unremarkable. Cardiovascular: Regular rhythm.  Lungs: Normal work of breathing. Neurologic: No focal deficits.   Lab Results  Component Value Date   CREATININE 0.9 04/19/2020   BUN 26 (A) 04/19/2020   NA 142 04/19/2020   K 4.6 04/19/2020   CL 106 04/19/2020   CO2 31 (A) 04/19/2020   Lab Results  Component Value Date   ALT 25 04/19/2020   AST 40 (A) 04/19/2020   ALKPHOS 60 12/30/2019   BILITOT 0.8 06/18/2019   Lab Results  Component Value Date   HGBA1C 5.9 03/12/2020   HGBA1C 7.3 09/22/2019   HGBA1C 6.5 (H) 07/28/2018   HGBA1C 10.1 (H) 09/08/2012   HGBA1C 10.1 (H) 09/07/2012   Lab Results  Component Value Date   TSH 3.45 12/13/2019   Lab Results  Component Value Date   CHOL 95 12/13/2019   HDL 44 12/13/2019   LDLCALC 29 12/13/2019   TRIG 123 12/13/2019   CHOLHDL 2.8 08/19/2017   Lab Results  Component Value Date   WBC 4.8 06/18/2019   HGB 13.8 06/18/2019   HCT 43.8 06/18/2019   MCV 83.3 06/18/2019   PLT PLATELET CLUMPS NOTED ON SMEAR, UNABLE TO ESTIMATE 06/18/2019   Lab Results  Component Value Date   IRON 39 08/19/2017   TIBC 404 08/19/2017   Obesity Behavioral Intervention:   Approximately 15 minutes were spent on the discussion below.  ASK: We discussed the diagnosis of obesity with Katherine [name] today and Katherine [name] agreed to give [name] permission  to discuss obesity behavioral modification therapy today.  ASSESS: [name] has the diagnosis of obesity and her BMI today is 41.9. Katherine [name] is in the action stage of change.   ADVISE: Katherine [name] was educated on the multiple health risks of obesity as well as the benefit of weight loss to improve her health. She was advised of the need for long term treatment and the importance of lifestyle modifications to improve her current health and to decrease her risk of future health problems.  AGREE: Multiple dietary modification options and treatment options were discussed and Neera agreed to follow the recommendations documented in the above note.  ARRANGE: Jakaila was educated on the importance of frequent visits to treat obesity as outlined per CMS and USPSTF guidelines and agreed to schedule her next follow up appointment today.  Attestation Statements:   Reviewed by clinician on day of visit: allergies, medications, problem list, medical history, surgical history, family history, social history, and previous encounter notes.  I, Water quality scientist, CMA, am acting as Location manager for [employer],  DO  I have reviewed the above documentation for accuracy and completeness, and I agree with the above. Briscoe Deutscher, DO

## 2020-05-16 LAB — TOXASSURE SELECT,+ANTIDEPR,UR

## 2020-05-17 ENCOUNTER — Telehealth: Payer: Self-pay | Admitting: *Deleted

## 2020-05-17 NOTE — Telephone Encounter (Signed)
Urine drug screen for this encounter is consistent for prescribed medication 

## 2020-05-21 ENCOUNTER — Ambulatory Visit
Admission: RE | Admit: 2020-05-21 | Discharge: 2020-05-21 | Disposition: A | Discharge status: Home / Self Care | Payer: Medicare Other | Source: Ambulatory Visit | Attending: Nurse Practitioner | Admitting: Nurse Practitioner

## 2020-05-21 DIAGNOSIS — K7469 Other cirrhosis of liver: Secondary | ICD-10-CM

## 2020-05-21 DIAGNOSIS — K7460 Unspecified cirrhosis of liver: Secondary | ICD-10-CM | POA: Diagnosis not present

## 2020-05-25 NOTE — Progress Notes (Signed)
Attempted to obtain medical history via telephone, unable to reach at this time. I left a voicemail to return pre surgical testing department's phone call.  

## 2020-05-28 ENCOUNTER — Other Ambulatory Visit (HOSPITAL_COMMUNITY)
Admission: RE | Admit: 2020-05-28 | Discharge: 2020-05-28 | Disposition: A | Discharge status: Home / Self Care | Payer: Medicare Other | Source: Ambulatory Visit | Attending: Gastroenterology | Admitting: Gastroenterology

## 2020-05-28 ENCOUNTER — Telehealth (INDEPENDENT_AMBULATORY_CARE_PROVIDER_SITE_OTHER): Payer: Self-pay | Admitting: Family Medicine

## 2020-05-28 DIAGNOSIS — Z20822 Contact with and (suspected) exposure to covid-19: Secondary | ICD-10-CM | POA: Diagnosis not present

## 2020-05-28 DIAGNOSIS — Z01812 Encounter for preprocedural laboratory examination: Secondary | ICD-10-CM | POA: Diagnosis not present

## 2020-05-28 NOTE — Telephone Encounter (Signed)
Nurse from Dr Joaquin Music office called to speak with nurse about patient's medication. [phone].

## 2020-05-29 ENCOUNTER — Other Ambulatory Visit: Payer: Self-pay

## 2020-05-29 LAB — SARS CORONAVIRUS 2 (TAT 6-24 HRS): SARS Coronavirus 2: NEGATIVE

## 2020-05-29 NOTE — Telephone Encounter (Signed)
Med list updated

## 2020-05-29 NOTE — Telephone Encounter (Signed)
LMTCB

## 2020-05-31 ENCOUNTER — Encounter (HOSPITAL_COMMUNITY)
Admission: RE | Disposition: A | Discharge status: Home / Self Care | Payer: Self-pay | Source: Home / Self Care | Attending: Gastroenterology

## 2020-05-31 ENCOUNTER — Other Ambulatory Visit: Payer: Self-pay

## 2020-05-31 ENCOUNTER — Ambulatory Visit (HOSPITAL_COMMUNITY)
Admission: RE | Admit: 2020-05-31 | Discharge: 2020-05-31 | Disposition: A | Discharge status: Home / Self Care | Payer: Medicare Other | Attending: Gastroenterology | Admitting: Gastroenterology

## 2020-05-31 ENCOUNTER — Ambulatory Visit (HOSPITAL_COMMUNITY): Payer: Medicare Other | Admitting: Anesthesiology

## 2020-05-31 ENCOUNTER — Encounter (HOSPITAL_COMMUNITY): Payer: Self-pay | Admitting: Gastroenterology

## 2020-05-31 DIAGNOSIS — Z8371 Family history of colonic polyps: Secondary | ICD-10-CM | POA: Insufficient documentation

## 2020-05-31 DIAGNOSIS — Z885 Allergy status to narcotic agent status: Secondary | ICD-10-CM | POA: Diagnosis not present

## 2020-05-31 DIAGNOSIS — Z8249 Family history of ischemic heart disease and other diseases of the circulatory system: Secondary | ICD-10-CM | POA: Diagnosis not present

## 2020-05-31 DIAGNOSIS — K7460 Unspecified cirrhosis of liver: Secondary | ICD-10-CM | POA: Insufficient documentation

## 2020-05-31 DIAGNOSIS — K766 Portal hypertension: Secondary | ICD-10-CM | POA: Insufficient documentation

## 2020-05-31 DIAGNOSIS — Z6841 Body Mass Index (BMI) 40.0 and over, adult: Secondary | ICD-10-CM | POA: Insufficient documentation

## 2020-05-31 DIAGNOSIS — Z79890 Hormone replacement therapy (postmenopausal): Secondary | ICD-10-CM | POA: Diagnosis not present

## 2020-05-31 DIAGNOSIS — Z888 Allergy status to other drugs, medicaments and biological substances status: Secondary | ICD-10-CM | POA: Diagnosis not present

## 2020-05-31 DIAGNOSIS — Z79899 Other long term (current) drug therapy: Secondary | ICD-10-CM | POA: Insufficient documentation

## 2020-05-31 DIAGNOSIS — Z794 Long term (current) use of insulin: Secondary | ICD-10-CM | POA: Diagnosis not present

## 2020-05-31 DIAGNOSIS — Z801 Family history of malignant neoplasm of trachea, bronchus and lung: Secondary | ICD-10-CM | POA: Diagnosis not present

## 2020-05-31 DIAGNOSIS — K259 Gastric ulcer, unspecified as acute or chronic, without hemorrhage or perforation: Secondary | ICD-10-CM | POA: Diagnosis not present

## 2020-05-31 DIAGNOSIS — Z87891 Personal history of nicotine dependence: Secondary | ICD-10-CM | POA: Insufficient documentation

## 2020-05-31 DIAGNOSIS — K3189 Other diseases of stomach and duodenum: Secondary | ICD-10-CM | POA: Insufficient documentation

## 2020-05-31 HISTORY — PX: ESOPHAGOGASTRODUODENOSCOPY (EGD) WITH PROPOFOL: SHX5813

## 2020-05-31 LAB — GLUCOSE, CAPILLARY: Glucose-Capillary: 122 mg/dL — ABNORMAL HIGH (ref 70–99)

## 2020-05-31 SURGERY — ESOPHAGOGASTRODUODENOSCOPY (EGD) WITH PROPOFOL
Anesthesia: Monitor Anesthesia Care

## 2020-05-31 MED ORDER — SODIUM CHLORIDE 0.9 % IV SOLN: INTRAVENOUS | Status: DC

## 2020-05-31 MED ORDER — PROPOFOL 500 MG/50ML IV EMUL
INTRAVENOUS | Status: DC | PRN
  Administered 2020-05-31: 175 ug/kg/min via INTRAVENOUS

## 2020-05-31 MED ORDER — PROPOFOL 10 MG/ML IV BOLUS
INTRAVENOUS | Status: AC
  Filled 2020-05-31: qty 20

## 2020-05-31 MED ORDER — LACTATED RINGERS IV SOLN: INTRAVENOUS | Status: DC

## 2020-05-31 SURGICAL SUPPLY — 15 items

## 2020-05-31 NOTE — Anesthesia Postprocedure Evaluation (Signed)
Anesthesia Post Note  Patient: Katherine [name]  Procedure(s) Performed: ESOPHAGOGASTRODUODENOSCOPY (EGD) WITH PROPOFOL (N/A )     Patient location during evaluation: Endoscopy Anesthesia Type: MAC Level of consciousness: awake and alert Pain management: pain level controlled Vital Signs Assessment: post-procedure vital signs reviewed and stable Respiratory status: spontaneous breathing, nonlabored ventilation and respiratory function stable Cardiovascular status: stable and blood pressure returned to baseline Postop Assessment: no apparent nausea or vomiting Anesthetic complications: no   No complications documented.  Last Vitals:  Vitals:   05/31/20 1327 05/31/20 1338  BP: 115/69 107/60  Pulse: 75 79  Resp: 17 15  Temp: 36.6 C   SpO2: 99% 94%    Last Pain:  Vitals:   05/31/20 1338  TempSrc:   PainSc: 0-No pain                 Narada Uzzle,W. EDMOND

## 2020-05-31 NOTE — Anesthesia Preprocedure Evaluation (Signed)
Anesthesia Evaluation  Patient identified by MRN, date of birth, ID band Patient awake    Reviewed: Allergy & Precautions, NPO status , Patient's Chart, lab work & pertinent test results  Airway Mallampati: II  TM Distance: >3 FB Neck ROM: Full    Dental no notable dental hx.    Pulmonary former smoker,    Pulmonary exam normal breath sounds clear to auscultation       Cardiovascular hypertension, Pt. on medications Normal cardiovascular exam Rhythm:Regular Rate:Normal     Neuro/Psych  Neuromuscular disease negative psych ROS   GI/Hepatic GERD  ,(+) Cirrhosis   Esophageal Varices    ,   Endo/Other  diabetes, Insulin DependentHypothyroidism Morbid obesity  Renal/GU negative Renal ROS  negative genitourinary   Musculoskeletal negative musculoskeletal ROS (+)   Abdominal   Peds negative pediatric ROS (+)  Hematology negative hematology ROS (+)   Anesthesia Other Findings   Reproductive/Obstetrics negative OB ROS                             Anesthesia Physical Anesthesia Plan  ASA: III  Anesthesia Plan: MAC   Post-op Pain Management:    Induction: Intravenous  PONV Risk Score and Plan: 2 and Propofol infusion and Treatment may vary due to age or medical condition  Airway Management Planned: Simple Face Mask  Additional Equipment:   Intra-op Plan:   Post-operative Plan:   Informed Consent: I have reviewed the patients History and Physical, chart, labs and discussed the procedure including the risks, benefits and alternatives for the proposed anesthesia with the patient or authorized representative who has indicated his/her understanding and acceptance.     Dental advisory given  Plan Discussed with: CRNA and Surgeon  Anesthesia Plan Comments:         Anesthesia Quick Evaluation

## 2020-05-31 NOTE — Op Note (Signed)
[facility] Patient Name: Katherine [name] Procedure Date: 05/31/2020 MRN: [id number] Attending MD: Ronnette Juniper , MD Date of Birth: Sep 22, 1955 CSN: [id number] Age: 65 Admit Type: Outpatient Procedure:                Upper GI endoscopy Indications:              Cirrhosis rule out esophageal varices Providers:                Ronnette Juniper, MD, Mariana Arn, Carmie End, RN,                            Ladona Ridgel, Technician, Herbie Drape, CRNA Referring MD:             Madilyn Fireman Medicines:                Monitored Anesthesia Care Complications:            No immediate complications. Estimated blood loss:                            Minimal. Estimated Blood Loss:     Estimated blood loss was minimal. Procedure:                Pre-Anesthesia Assessment:                           - Prior to the procedure, a History and Physical                            was performed, and patient medications and                            allergies were reviewed. The patient's tolerance of                            previous anesthesia was also reviewed. The risks                            and benefits of the procedure and the sedation                            options and risks were discussed with the patient.                            All questions were answered, and informed consent                            was obtained. Prior Anticoagulants: The patient has                            taken no previous anticoagulant or antiplatelet                            agents. ASA Grade Assessment: III - A patient with  severe systemic disease. After reviewing the risks                            and benefits, the patient was deemed in                            satisfactory condition to undergo the procedure.                           After obtaining informed consent, the endoscope was                            passed under direct vision. Throughout the                             procedure, the patient's blood pressure, pulse, and                            oxygen saturations were monitored continuously. The                            [id number]-H190 ([id number]) was introduced through the                            mouth, and advanced to the second part of duodenum.                            The upper GI endoscopy was accomplished without                            difficulty. The patient tolerated the procedure                            well. Scope In: Scope Out: Findings:      The examined esophagus was normal.      The Z-line was regular.      A large amount of food (residue) was found in the cardia, in the gastric       fundus and in the gastric body.      The cardia and gastric fundus were normal on retroflexion.      Patchy moderately erythematous mucosa without bleeding was found in the       gastric fundus and in the gastric body.      Moderate portal hypertensive gastropathy was found in the gastric body,       on the greater curvature of the stomach and in the gastric antrum.      The examined duodenum was normal.      Few non-bleeding superficial gastric ulcers with a clean ulcer base       (Forrest Class III) were found in the gastric antrum. The largest lesion       was 4 mm in largest dimension. Impression:               - Normal esophagus.                           -  Z-line regular.                           - A large amount of food (residue) in the stomach.                           - Erythematous mucosa in the gastric fundus and                            gastric body.                           - Portal hypertensive gastropathy.                           - Normal examined duodenum.                           - Non-bleeding gastric ulcers with a clean ulcer                            base (Forrest Class III).                           - No specimens collected. Moderate Sedation:      Patient did not receive moderate sedation  for this procedure, but       instead received monitored anesthesia care. Recommendation:           - Patient has a contact number available for                            emergencies. The signs and symptoms of potential                            delayed complications were discussed with the                            patient. Return to normal activities tomorrow.                            Written discharge instructions were provided to the                            patient.                           - Low fiber diet and low residue diet.                           - Continue present medications.                           - Perform an upper GI endoscopy in 3 years. Procedure Code(s):        --- Professional ---  71836, Esophagogastroduodenoscopy, flexible,                            transoral; diagnostic, including collection of                            specimen(s) by brushing or washing, when performed                            (separate procedure) Diagnosis Code(s):        --- Professional ---                           K31.89, Other diseases of stomach and duodenum                           K76.6, Portal hypertension                           K25.9, Gastric ulcer, unspecified as acute or                            chronic, without hemorrhage or perforation                           K74.60, Unspecified cirrhosis of liver CPT copyright 2019 American Medical Association. All rights reserved. The codes documented in this report are preliminary and upon coder review may  be revised to meet current compliance requirements. Ronnette Juniper, MD 05/31/2020 1:29:04 PM This report has been signed electronically. Number of Addenda: 0

## 2020-05-31 NOTE — Transfer of Care (Signed)
Immediate Anesthesia Transfer of Care Note  Patient: Katherine [name]  Procedure(s) Performed: ESOPHAGOGASTRODUODENOSCOPY (EGD) WITH PROPOFOL (N/A )  Patient Location: PACU and Endoscopy Unit  Anesthesia Type:MAC  Level of Consciousness: awake, alert , oriented and patient cooperative  Airway & Oxygen Therapy: Patient Spontanous Breathing and Patient connected to face mask oxygen  Post-op Assessment: Report given to RN, Post -op Vital signs reviewed and stable and Patient moving all extremities  Post vital signs: Reviewed and stable  Last Vitals:  Vitals Value Taken Time  BP    Temp    Pulse    Resp    SpO2      Last Pain:  Vitals:   05/31/20 1232  TempSrc: Axillary  PainSc: 0-No pain         Complications: No complications documented.

## 2020-05-31 NOTE — Discharge Instructions (Signed)
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
## 2020-05-31 NOTE — Brief Op Note (Signed)
05/31/2020  1:29 PM  PATIENT:  Katherine [name]  65 y.o. female  PRE-OPERATIVE DIAGNOSIS:  Cirrhosis rule out varices  POST-OPERATIVE DIAGNOSIS:  portal hypertensive gastropathy. no varices, food retention.  PROCEDURE:  Procedure(s): ESOPHAGOGASTRODUODENOSCOPY (EGD) WITH PROPOFOL (N/A)  SURGEON:  Surgeon(s) and Role:    * Ronnette Juniper, MD - Primary  PHYSICIAN ASSISTANT:   ASSISTANTS:Donna Pickering,RN,Shayl Proctor,Tech  ANESTHESIA: MAC  EBL:  None  BLOOD ADMINISTERED:none  DRAINS: none   LOCAL MEDICATIONS USED:  NONE  SPECIMEN:  No Specimen  DISPOSITION OF SPECIMEN:  N/A  COUNTS:  YES  TOURNIQUET:  * No tourniquets in log *  DICTATION: .Dragon Dictation  PLAN OF CARE: Discharge to home after PACU  PATIENT DISPOSITION:  PACU - hemodynamically stable.   Delay start of Pharmacological VTE agent (>24hrs) due to surgical blood loss or risk of bleeding: not applicable

## 2020-06-01 ENCOUNTER — Encounter (HOSPITAL_COMMUNITY): Payer: Self-pay | Admitting: Gastroenterology

## 2020-06-11 NOTE — H&P (Signed)
Physical exam: Well-developed, overweight No pallor, no icterus Lungs: Equal air entry Heart:Regular rate /rhythm Abdomen: Soft, nondistended, nontender Neuro: Alert, oriented to time, place and person

## 2020-06-14 DIAGNOSIS — Z794 Long term (current) use of insulin: Secondary | ICD-10-CM | POA: Diagnosis not present

## 2020-06-14 DIAGNOSIS — M810 Age-related osteoporosis without current pathological fracture: Secondary | ICD-10-CM | POA: Diagnosis not present

## 2020-06-14 DIAGNOSIS — E1165 Type 2 diabetes mellitus with hyperglycemia: Secondary | ICD-10-CM | POA: Diagnosis not present

## 2020-06-15 ENCOUNTER — Other Ambulatory Visit (INDEPENDENT_AMBULATORY_CARE_PROVIDER_SITE_OTHER): Payer: Self-pay | Admitting: Family Medicine

## 2020-06-15 DIAGNOSIS — I10 Essential (primary) hypertension: Secondary | ICD-10-CM

## 2020-06-18 NOTE — Telephone Encounter (Signed)
Pt last seen by Dr. Wallace.  

## 2020-06-20 ENCOUNTER — Ambulatory Visit (INDEPENDENT_AMBULATORY_CARE_PROVIDER_SITE_OTHER): Payer: Medicare Other | Admitting: Family Medicine

## 2020-06-20 ENCOUNTER — Other Ambulatory Visit: Payer: Self-pay

## 2020-06-20 VITALS — BP 114/71 | HR 71 | Temp 98.3°F | Ht 66.0 in | Wt 265.0 lb

## 2020-06-20 DIAGNOSIS — Z6841 Body Mass Index (BMI) 40.0 and over, adult: Secondary | ICD-10-CM

## 2020-06-20 DIAGNOSIS — K259 Gastric ulcer, unspecified as acute or chronic, without hemorrhage or perforation: Secondary | ICD-10-CM

## 2020-06-20 DIAGNOSIS — Z794 Long term (current) use of insulin: Secondary | ICD-10-CM | POA: Diagnosis not present

## 2020-06-20 DIAGNOSIS — E1169 Type 2 diabetes mellitus with other specified complication: Secondary | ICD-10-CM

## 2020-06-20 DIAGNOSIS — F5101 Primary insomnia: Secondary | ICD-10-CM

## 2020-06-20 DIAGNOSIS — Z79899 Other long term (current) drug therapy: Secondary | ICD-10-CM

## 2020-06-21 ENCOUNTER — Encounter (INDEPENDENT_AMBULATORY_CARE_PROVIDER_SITE_OTHER): Payer: Self-pay | Admitting: Family Medicine

## 2020-06-21 MED ORDER — TRAZODONE HCL 50 MG PO TABS: 25.0000 mg | ORAL_TABLET | Freq: Every evening | ORAL | 3 refills | Status: DC | PRN

## 2020-06-21 NOTE — Progress Notes (Signed)
Chief Complaint:   OBESITY Katherine [name] is here to discuss her progress with her obesity treatment plan along with follow-up of her obesity related diagnoses.   Today's visit was #: 11 Starting weight: 293 lbs Starting date: 10/13/2019 Today's weight: 265 lbs Today's date: 06/20/2020 Total lbs lost to date: 28 lbs Body mass index is 42.77 kg/m.  Total weight loss percentage to date: -9.56%  Interim History:  Rafeef takes [name] 20-30 units as needed (averaging 1-2 times per week).  We discussed 10 units daily instead of 20-30 units as needed.  She is also on Ozempic 1 mg subcutaneously weekly and Farxiga 5 mg daily.  She is off Humalog.    Current Meal Plan: keeping a food journal and adhering to recommended goals of 1200-1500 calories and 95 grams of protein for a small percentage of the time.  Current Exercise Plan: Cardio for 60 minutes 2 times per week. Current Anti-Obesity Medications: Ozempic 1 mg subcutaneously weekly. Side effects: None.  Assessment/Plan:   1. Primary insomnia This is poorly controlled.    Plan:  Stop mirtazapine due to obesogenic nature.  Start trazodone 50 mg at bedtime.  Recommend sleep hygiene measures including regular sleep schedule, optimal sleep environment, and relaxing presleep rituals.   - Start traZODone (DESYREL) 50 MG tablet; Take 0.5-1 tablets (25-50 mg total) by mouth at bedtime as needed for sleep.  Dispense: 30 tablet; Refill: 3  2. Type 2 diabetes mellitus with other specified complication, with long-term current use of insulin (HCC) Diabetes Mellitus: Not at goal. Medication: [name] 20-30 units as needed, Ozempic 1 mg subcutaneously weekly, and Farxiga 5 mg daily.  Issues reviewed: blood sugar goals, complications of diabetes mellitus, hypoglycemia prevention and treatment, exercise, and nutrition.   Plan:  She will continue to focus on protein-rich, low simple carbohydrate foods. We reviewed the importance of hydration, regular  exercise for stress reduction, and restorative sleep.   Lab Results  Component Value Date   HGBA1C 5.9 03/12/2020   HGBA1C 7.3 09/22/2019   HGBA1C 6.5 (H) 07/28/2018   Lab Results  Component Value Date   MICROALBUR 2.86 04/19/2020   LDLCALC 29 12/13/2019   CREATININE 0.9 04/19/2020   3. Medication management She endorses polyphagia.  Plan:  Stop Remeron.  Will provide trazodone as alternative.  Advised to stop melatonin 10 mg at bedtime.  Change to 1 mg, 4 hours prior to bed.  4. Gastric ulcer, unspecified chronicity, unspecified whether gastric ulcer hemorrhage or perforation present Reviewed EGD.  On PPI.  Consider changing bisphosphonate.  Also with slow gastric emptying. Plan:  Discussed 4 smaller meals throughout the day.  5. Obesity, current BMI 42.8  Course: Agata is currently in the action stage of change. As such, her goal is to continue with weight loss efforts.   Nutrition goals: She has agreed to keeping a food journal and adhering to recommended goals of 1200-1500 calories and 95 grams of protein.   Exercise goals: As is.  Behavioral modification strategies: increasing lean protein intake, decreasing simple carbohydrates, increasing vegetables and increasing water intake.  Caidance has agreed to follow-up with our clinic in 3 weeks. She was informed of the importance of frequent follow-up visits to maximize her success with intensive lifestyle modifications for her multiple health conditions.   Objective:   Blood pressure 114/71, pulse 71, temperature 98.3 F (36.8 C), temperature source Oral, height 5' 6"  (1.676 m), weight 265 lb (120.2 kg), SpO2 93 %. Body mass index is 42.77  kg/m.  General: Cooperative, alert, well developed, in no acute distress. HEENT: Conjunctivae and lids unremarkable. Cardiovascular: Regular rhythm.  Lungs: Normal work of breathing. Neurologic: No focal deficits.   Lab Results  Component Value Date   CREATININE 0.9 04/19/2020    BUN 26 (A) 04/19/2020   NA 142 04/19/2020   K 4.6 04/19/2020   CL 106 04/19/2020   CO2 31 (A) 04/19/2020   Lab Results  Component Value Date   ALT 25 04/19/2020   AST 40 (A) 04/19/2020   ALKPHOS 60 12/30/2019   BILITOT 0.8 06/18/2019   Lab Results  Component Value Date   HGBA1C 5.9 03/12/2020   HGBA1C 7.3 09/22/2019   HGBA1C 6.5 (H) 07/28/2018   HGBA1C 10.1 (H) 09/08/2012   HGBA1C 10.1 (H) 09/07/2012   Lab Results  Component Value Date   TSH 3.45 12/13/2019   Lab Results  Component Value Date   CHOL 95 12/13/2019   HDL 44 12/13/2019   LDLCALC 29 12/13/2019   TRIG 123 12/13/2019   CHOLHDL 2.8 08/19/2017   Lab Results  Component Value Date   WBC 4.8 06/18/2019   HGB 13.8 06/18/2019   HCT 43.8 06/18/2019   MCV 83.3 06/18/2019   PLT PLATELET CLUMPS NOTED ON SMEAR, UNABLE TO ESTIMATE 06/18/2019   Lab Results  Component Value Date   IRON 39 08/19/2017   TIBC 404 08/19/2017   Obesity Behavioral Intervention:   Approximately 15 minutes were spent on the discussion below.  ASK: We discussed the diagnosis of obesity with [name] today and Rylyn agreed to give [name] permission to discuss obesity behavioral modification therapy today.  ASSESS: Merrick has the diagnosis of obesity and her BMI today is 42.8. Mikhala is in the action stage of change.   ADVISE: Norina was educated on the multiple health risks of obesity as well as the benefit of weight loss to improve her health. She was advised of the need for long term treatment and the importance of lifestyle modifications to improve her current health and to decrease her risk of future health problems.  AGREE: Multiple dietary modification options and treatment options were discussed and Saralyn agreed to follow the recommendations documented in the above note.  ARRANGE: Dominik was educated on the importance of frequent visits to treat obesity as outlined per CMS and USPSTF guidelines and agreed to  schedule her next follow up appointment today.  Attestation Statements:   Reviewed by clinician on day of visit: allergies, medications, problem list, medical history, surgical history, family history, social history, and previous encounter notes.  I, Water quality scientist, CMA, am acting as transcriptionist for Briscoe Deutscher, DO  I have reviewed the above documentation for accuracy and completeness, and I agree with the above. Briscoe Deutscher, DO

## 2020-07-02 ENCOUNTER — Other Ambulatory Visit: Payer: Self-pay | Admitting: Registered Nurse

## 2020-07-02 ENCOUNTER — Other Ambulatory Visit: Payer: Self-pay | Admitting: Physical Medicine & Rehabilitation

## 2020-07-02 ENCOUNTER — Other Ambulatory Visit (INDEPENDENT_AMBULATORY_CARE_PROVIDER_SITE_OTHER): Payer: Self-pay | Admitting: Family Medicine

## 2020-07-02 DIAGNOSIS — M7121 Synovial cyst of popliteal space [Baker], right knee: Secondary | ICD-10-CM

## 2020-07-02 DIAGNOSIS — M19172 Post-traumatic osteoarthritis, left ankle and foot: Secondary | ICD-10-CM

## 2020-07-02 DIAGNOSIS — M170 Bilateral primary osteoarthritis of knee: Secondary | ICD-10-CM

## 2020-07-02 DIAGNOSIS — I10 Essential (primary) hypertension: Secondary | ICD-10-CM

## 2020-07-02 DIAGNOSIS — G5712 Meralgia paresthetica, left lower limb: Secondary | ICD-10-CM

## 2020-07-03 ENCOUNTER — Encounter (INDEPENDENT_AMBULATORY_CARE_PROVIDER_SITE_OTHER): Payer: Self-pay

## 2020-07-03 NOTE — Telephone Encounter (Signed)
Dr.Wallace °

## 2020-07-03 NOTE — Telephone Encounter (Signed)
Message sent to pt.

## 2020-07-04 ENCOUNTER — Telehealth: Payer: Self-pay

## 2020-07-04 DIAGNOSIS — S8412XS Injury of peroneal nerve at lower leg level, left leg, sequela: Secondary | ICD-10-CM

## 2020-07-04 NOTE — Telephone Encounter (Signed)
Canceled appt for May 10th because pt had 2 appts for the same day , however next available appt is  Not until May20th and by then she will be out of meds so she needs medicatioon before her next appt on may 20th.

## 2020-07-05 MED ORDER — OXYCODONE HCL 10 MG PO TABS: 10.0000 mg | ORAL_TABLET | Freq: Three times a day (TID) | ORAL | 0 refills | Status: DC | PRN

## 2020-07-05 NOTE — Telephone Encounter (Signed)
PMP was Reviewed: Oxycodone e-scribed today. She has an appointment in two weeks

## 2020-07-10 ENCOUNTER — Encounter: Payer: Medicare Other | Admitting: Registered Nurse

## 2020-07-13 ENCOUNTER — Other Ambulatory Visit (INDEPENDENT_AMBULATORY_CARE_PROVIDER_SITE_OTHER): Payer: Self-pay | Admitting: Family Medicine

## 2020-07-13 DIAGNOSIS — F5101 Primary insomnia: Secondary | ICD-10-CM

## 2020-07-16 ENCOUNTER — Other Ambulatory Visit (INDEPENDENT_AMBULATORY_CARE_PROVIDER_SITE_OTHER): Payer: Self-pay | Admitting: Family Medicine

## 2020-07-16 DIAGNOSIS — I10 Essential (primary) hypertension: Secondary | ICD-10-CM

## 2020-07-16 NOTE — Telephone Encounter (Signed)
Pt last seen by Dr. Wallace.  

## 2020-07-17 NOTE — Telephone Encounter (Signed)
Pt last seen by Dr. Wallace.  

## 2020-07-20 ENCOUNTER — Encounter: Payer: Self-pay | Admitting: Registered Nurse

## 2020-07-20 ENCOUNTER — Encounter: Payer: Medicare Other | Attending: Registered Nurse | Admitting: Registered Nurse

## 2020-07-20 ENCOUNTER — Other Ambulatory Visit: Payer: Self-pay

## 2020-07-20 VITALS — BP 129/76 | HR 92 | Temp 98.9°F | Ht 66.0 in | Wt 265.0 lb

## 2020-07-20 DIAGNOSIS — M1711 Unilateral primary osteoarthritis, right knee: Secondary | ICD-10-CM | POA: Diagnosis not present

## 2020-07-20 DIAGNOSIS — G894 Chronic pain syndrome: Secondary | ICD-10-CM

## 2020-07-20 DIAGNOSIS — R202 Paresthesia of skin: Secondary | ICD-10-CM | POA: Diagnosis not present

## 2020-07-20 DIAGNOSIS — Z5181 Encounter for therapeutic drug level monitoring: Secondary | ICD-10-CM

## 2020-07-20 DIAGNOSIS — Z79891 Long term (current) use of opiate analgesic: Secondary | ICD-10-CM

## 2020-07-20 DIAGNOSIS — S8412XS Injury of peroneal nerve at lower leg level, left leg, sequela: Secondary | ICD-10-CM

## 2020-07-20 DIAGNOSIS — M47816 Spondylosis without myelopathy or radiculopathy, lumbar region: Secondary | ICD-10-CM | POA: Diagnosis not present

## 2020-07-20 MED ORDER — OXYCODONE HCL 10 MG PO TABS: 10.0000 mg | ORAL_TABLET | Freq: Three times a day (TID) | ORAL | 0 refills | Status: DC | PRN
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
## 2020-07-23 ENCOUNTER — Encounter (INDEPENDENT_AMBULATORY_CARE_PROVIDER_SITE_OTHER): Payer: Self-pay | Admitting: Family Medicine

## 2020-07-23 ENCOUNTER — Ambulatory Visit (INDEPENDENT_AMBULATORY_CARE_PROVIDER_SITE_OTHER): Payer: Medicare Other | Admitting: Family Medicine

## 2020-07-23 ENCOUNTER — Other Ambulatory Visit: Payer: Self-pay

## 2020-07-23 VITALS — BP 103/68 | HR 67 | Temp 98.1°F | Ht 66.0 in | Wt 262.0 lb

## 2020-07-23 DIAGNOSIS — I10 Essential (primary) hypertension: Secondary | ICD-10-CM | POA: Diagnosis not present

## 2020-07-23 DIAGNOSIS — Z794 Long term (current) use of insulin: Secondary | ICD-10-CM

## 2020-07-23 DIAGNOSIS — K279 Peptic ulcer, site unspecified, unspecified as acute or chronic, without hemorrhage or perforation: Secondary | ICD-10-CM

## 2020-07-23 DIAGNOSIS — E1169 Type 2 diabetes mellitus with other specified complication: Secondary | ICD-10-CM | POA: Diagnosis not present

## 2020-07-23 DIAGNOSIS — Z6841 Body Mass Index (BMI) 40.0 and over, adult: Secondary | ICD-10-CM

## 2020-07-24 MED ORDER — OZEMPIC (2 MG/DOSE) 8 MG/3ML ~~LOC~~ SOPN: 2.0000 mg | PEN_INJECTOR | SUBCUTANEOUS | 0 refills | Status: DC

## 2020-07-24 MED ORDER — LISINOPRIL-HYDROCHLOROTHIAZIDE 10-12.5 MG PO TABS: 1.0000 | ORAL_TABLET | Freq: Every day | ORAL | 0 refills | Status: DC

## 2020-07-26 NOTE — Progress Notes (Signed)
Chief Complaint:   OBESITY Adelia is here to discuss her progress with her obesity treatment plan along with follow-up of her obesity related diagnoses.   Today's visit was #: 12 Starting weight: 293 lbs Starting date: 10/13/2019 Today's weight: 262 lbs Today's date: 07/23/2020 Weight change since last visit: 3 lbs Total lbs lost to date: 31 lbs Body mass index is 42.29 kg/m.  Total weight loss percentage to date: -10.58%  Interim History:  Zulma requests a refill of her lisinopril-HCTZ for 90 days.  Current Meal Plan: keeping a food journal and adhering to recommended goals of 1200-1500 calories and 95 grams of protein for 50% of the time.  Current Exercise Plan: Walking for 20 minutes 3 times per week. Current Anti-Obesity Medications: Ozempic 1 mg subcutaneously weekly. Side effects: None.  Assessment/Plan:   Meds ordered this encounter  Medications  . lisinopril-hydrochlorothiazide (ZESTORETIC) 10-12.5 MG tablet    Sig: Take 1 tablet by mouth daily.    Dispense:  90 tablet    Refill:  0  . DISCONTD: Semaglutide, 2 MG/DOSE, (OZEMPIC, 2 MG/DOSE,) 8 MG/3ML SOPN    Sig: Inject 2 mg into the skin once a week.    Dispense:  3 mL    Refill:  0  . Semaglutide, 2 MG/DOSE, (OZEMPIC, 2 MG/DOSE,) 8 MG/3ML SOPN    Sig: Inject 2 mg into the skin once a week.    Dispense:  3 mL    Refill:  0    1. Type 2 diabetes mellitus with other specified complication, with long-term current use of insulin (HCC) Diabetes Mellitus: Not at goal. Medication: Ozempic 1 mg subcutaneously weekly, Farxiga 5 mg daily, [name]. Issues reviewed: blood sugar goals, complications of diabetes mellitus, hypoglycemia prevention and treatment, exercise, and nutrition.  No hypoglycemia.  Average BS in the low 100s.  Plan:  Increase Ozempic to 2 mg subcutaneously weekly.  Stop Wilder Glade due to to frequent yeast infections and mild dehydration.  The patient will continue to focus on protein-rich, low simple  carbohydrate foods. We reviewed the importance of hydration, regular exercise for stress reduction, and restorative sleep.   Lab Results  Component Value Date   HGBA1C 5.9 03/12/2020   HGBA1C 7.3 09/22/2019   HGBA1C 6.5 (H) 07/28/2018   Lab Results  Component Value Date   MICROALBUR 2.86 04/19/2020   LDLCALC 29 12/13/2019   CREATININE 0.9 04/19/2020   - Increase Semaglutide, 2 MG/DOSE, (OZEMPIC, 2 MG/DOSE,) 8 MG/3ML SOPN; Inject 2 mg into the skin once a week.  Dispense: 3 mL; Refill: 0  2. Essential hypertension Low today.  Medications: Zestoretic 37-90.2 mg daily, Bystolic 10 mg daily.   Plan:  Refill lisinopril-HCTZ today, as per below.  Restart lisinopril-HCTZ.  Watch edema. Avoid buying foods that are: processed, frozen, or prepackaged to avoid excess salt. We will watch for signs of hypotension as she continues lifestyle modifications.  BP Readings from Last 3 Encounters:  07/23/20 103/68  07/20/20 129/76  06/20/20 114/71   Lab Results  Component Value Date   CREATININE 0.9 04/19/2020   - Refill lisinopril-hydrochlorothiazide (ZESTORETIC) 10-12.5 MG tablet; Take 1 tablet by mouth daily.  Dispense: 90 tablet; Refill: 0  3. PUD (peptic ulcer disease) Recent EGD results: erythematous mucosa in the gastric fundus and gastric body; non-bleeding gastric ulcers with a clean ulcer base ([place] Class III). Patient on bisphosphonate. Consider change to Prolia.   4. Obesity, current BMI 42.3  Course: Remingtyn is currently in the action stage  of change. As such, her goal is to continue with weight loss efforts.   Nutrition goals: She has agreed to keeping a food journal and adhering to recommended goals of 1200-1500 calories and 95 grams of protein.   Exercise goals: As tolerated.  Behavioral modification strategies: increasing lean protein intake, decreasing simple carbohydrates, increasing vegetables, increasing water intake, decreasing sodium intake and increasing high fiber  foods.  Larinda has agreed to follow-up with our clinic in 3 weeks. She was informed of the importance of frequent follow-up visits to maximize her success with intensive lifestyle modifications for her multiple health conditions.   Objective:   Blood pressure 103/68, pulse 67, temperature 98.1 F (36.7 C), temperature source Oral, height 5' 6"  (1.676 m), weight 262 lb (118.8 kg), SpO2 94 %. Body mass index is 42.29 kg/m.  General: Cooperative, alert, well developed, in no acute distress. HEENT: Conjunctivae and lids unremarkable. Cardiovascular: Regular rhythm.  Lungs: Normal work of breathing. Neurologic: No focal deficits.   Lab Results  Component Value Date   CREATININE 0.9 04/19/2020   BUN 26 (A) 04/19/2020   NA 142 04/19/2020   K 4.6 04/19/2020   CL 106 04/19/2020   CO2 31 (A) 04/19/2020   Lab Results  Component Value Date   ALT 25 04/19/2020   AST 40 (A) 04/19/2020   ALKPHOS 60 12/30/2019   BILITOT 0.8 06/18/2019   Lab Results  Component Value Date   HGBA1C 5.9 03/12/2020   HGBA1C 7.3 09/22/2019   HGBA1C 6.5 (H) 07/28/2018   HGBA1C 10.1 (H) 09/08/2012   HGBA1C 10.1 (H) 09/07/2012   Lab Results  Component Value Date   TSH 3.45 12/13/2019   Lab Results  Component Value Date   CHOL 95 12/13/2019   HDL 44 12/13/2019   LDLCALC 29 12/13/2019   TRIG 123 12/13/2019   CHOLHDL 2.8 08/19/2017   Lab Results  Component Value Date   WBC 4.8 06/18/2019   HGB 13.8 06/18/2019   HCT 43.8 06/18/2019   MCV 83.3 06/18/2019   PLT PLATELET CLUMPS NOTED ON SMEAR, UNABLE TO ESTIMATE 06/18/2019   Lab Results  Component Value Date   IRON 39 08/19/2017   TIBC 404 08/19/2017   Obesity Behavioral Intervention:   Approximately 15 minutes were spent on the discussion below.  ASK: We discussed the diagnosis of obesity with [name] today and Cyleigh agreed to give [name] permission to discuss obesity behavioral modification therapy today.  ASSESS: Devora has the  diagnosis of obesity and her BMI today is 42.3. Jocilynn is in the action stage of change.   ADVISE: Ray was educated on the multiple health risks of obesity as well as the benefit of weight loss to improve her health. She was advised of the need for long term treatment and the importance of lifestyle modifications to improve her current health and to decrease her risk of future health problems.  AGREE: Multiple dietary modification options and treatment options were discussed and Jada agreed to follow the recommendations documented in the above note.  ARRANGE: Jesse was educated on the importance of frequent visits to treat obesity as outlined per CMS and USPSTF guidelines and agreed to schedule her next follow up appointment today.  Attestation Statements:   Reviewed by clinician on day of visit: allergies, medications, problem list, medical history, surgical history, family history, social history, and previous encounter notes.  I, Water quality scientist, CMA, am acting as transcriptionist for Briscoe Deutscher, DO  I have reviewed the above documentation for  accuracy and completeness, and I agree with the above. Briscoe Deutscher, DO

## 2020-07-29 ENCOUNTER — Encounter: Payer: Self-pay | Admitting: Registered Nurse

## 2020-08-06 ENCOUNTER — Encounter (INDEPENDENT_AMBULATORY_CARE_PROVIDER_SITE_OTHER): Payer: Self-pay | Admitting: Family Medicine

## 2020-08-06 ENCOUNTER — Other Ambulatory Visit: Payer: Self-pay

## 2020-08-06 ENCOUNTER — Ambulatory Visit (INDEPENDENT_AMBULATORY_CARE_PROVIDER_SITE_OTHER): Payer: Medicare Other | Admitting: Family Medicine

## 2020-08-06 VITALS — BP 110/75 | HR 73 | Temp 98.3°F | Ht 66.0 in | Wt 268.0 lb

## 2020-08-06 DIAGNOSIS — Z794 Long term (current) use of insulin: Secondary | ICD-10-CM | POA: Diagnosis not present

## 2020-08-06 DIAGNOSIS — E038 Other specified hypothyroidism: Secondary | ICD-10-CM

## 2020-08-06 DIAGNOSIS — M818 Other osteoporosis without current pathological fracture: Secondary | ICD-10-CM

## 2020-08-06 DIAGNOSIS — E1169 Type 2 diabetes mellitus with other specified complication: Secondary | ICD-10-CM | POA: Diagnosis not present

## 2020-08-06 DIAGNOSIS — Z6841 Body Mass Index (BMI) 40.0 and over, adult: Secondary | ICD-10-CM | POA: Diagnosis not present

## 2020-08-06 DIAGNOSIS — I10 Essential (primary) hypertension: Secondary | ICD-10-CM

## 2020-08-09 MED ORDER — OZEMPIC (1 MG/DOSE) 4 MG/3ML ~~LOC~~ SOPN: PEN_INJECTOR | SUBCUTANEOUS | 2 refills | Status: DC

## 2020-08-09 MED ORDER — LEVOTHYROXINE SODIUM 25 MCG PO TABS: 25.0000 ug | ORAL_TABLET | Freq: Every day | ORAL | 0 refills | Status: DC

## 2020-08-09 MED ORDER — LISINOPRIL-HYDROCHLOROTHIAZIDE 10-12.5 MG PO TABS: 1.0000 | ORAL_TABLET | Freq: Every day | ORAL | 0 refills | Status: DC

## 2020-08-11 ENCOUNTER — Other Ambulatory Visit (INDEPENDENT_AMBULATORY_CARE_PROVIDER_SITE_OTHER): Payer: Self-pay | Admitting: Family Medicine

## 2020-08-11 DIAGNOSIS — K5909 Other constipation: Secondary | ICD-10-CM

## 2020-08-13 NOTE — Telephone Encounter (Signed)
Dr.Wallace °

## 2020-08-14 NOTE — Progress Notes (Signed)
Chief Complaint:   OBESITY Katherine [name] is here to discuss her progress with her obesity treatment plan along with follow-up of her obesity related diagnoses.   Today's visit was #: 63 Starting weight: 293 lbs Starting date: 10/13/2019 Today's weight: 268 lbs Today's date: 08/06/2020 Weight change since last visit: +6 Total lbs lost to date: 25 lbs Body mass index is 43.26 kg/m.  Total weight loss percentage to date: -8.53%  Interim History: Katherine [name] has been off levothyroxine for 2 weeks. She stopped the medication because she was concerned that it was causing hair loss. She also held her Lisinopril-HCTZ. Current Meal Plan: keeping a food journal and adhering to recommended goals of 1200-1500 calories and 95 grams of protein for 25% of the time.  Current Exercise Plan: None. Current Anti-Obesity Medications: Ozempic 2 mg subcutaneously weekly. Side effects: None.  Assessment/Plan:   Meds ordered this encounter  Medications   levothyroxine (SYNTHROID) 25 MCG tablet    Sig: Take 1 tablet (25 mcg total) by mouth daily before breakfast.    Dispense:  30 tablet    Refill:  0   lisinopril-hydrochlorothiazide (ZESTORETIC) 10-12.5 MG tablet    Sig: Take 1 tablet by mouth daily.    Dispense:  90 tablet    Refill:  0   Semaglutide, 1 MG/DOSE, (OZEMPIC, 1 MG/DOSE,) 4 MG/3ML SOPN    Sig: 1 mg SQ every Monday and Thursday    Dispense:  18 mL    Refill:  2    1. Other specified hypothyroidism Medication: Synthroid 25 mcg daily. Off x 2 weeks. Feels like thyroid is undertreated.  Plan:  Restart Synthroid.  Patient was instructed not to take MVM or iron within 4 hours of taking thyroid medications.  We will continue to monitor alongside Endocrinology/PCP as it relates to her weight loss journey.   Lab Results  Component Value Date   TSH 3.45 12/13/2019   - Refill levothyroxine (SYNTHROID) 25 MCG tablet; Take 1 tablet (25 mcg total) by mouth daily before breakfast.  Dispense: 30  tablet; Refill: 0  2. Essential hypertension At goal. Medications: lisinopril-HCTZ 63-33.5 mg daily, Bystolic 10 mg daily.   Plan:  Restart lisinopril-HCTZ.  Avoid buying foods that are: processed, frozen, or prepackaged to avoid excess salt. We will watch for signs of hypotension as she continues lifestyle modifications.  BP Readings from Last 3 Encounters:  08/06/20 110/75  07/23/20 103/68  07/20/20 129/76   Lab Results  Component Value Date   CREATININE 0.9 04/19/2020   - Refill lisinopril-hydrochlorothiazide (ZESTORETIC) 10-12.5 MG tablet; Take 1 tablet by mouth daily.  Dispense: 90 tablet; Refill: 0  3. Other osteoporosis Stay off Fosamax until she speaks with her PCP. She has PUD.  4. Type 2 diabetes mellitus with other specified complication, with long-term current use of insulin ([place]) Diabetes Mellitus: Not at goal. Medication: [name], Ozempic. Issues reviewed: blood sugar goals, complications of diabetes mellitus, hypoglycemia prevention and treatment, exercise, and nutrition.   Plan:  Continue Ozempic 1 mg subcutaneously every Monday and Thursday. The patient will continue to focus on protein-rich, low simple carbohydrate foods. We reviewed the importance of hydration, regular exercise for stress reduction, and restorative sleep.   Lab Results  Component Value Date   HGBA1C 5.9 03/12/2020   HGBA1C 7.3 09/22/2019   HGBA1C 6.5 (H) 07/28/2018   Lab Results  Component Value Date   MICROALBUR 2.86 04/19/2020   LDLCALC 29 12/13/2019   CREATININE 0.9 04/19/2020   - Refill  Semaglutide, 1 MG/DOSE, (OZEMPIC, 1 MG/DOSE,) 4 MG/3ML SOPN; 1 mg SQ every Monday and Thursday  Dispense: 18 mL; Refill: 2  5. Obesity, current BMI 43.4  Course: Ileana is currently in the action stage of change. As such, her goal is to continue with weight loss efforts.   Nutrition goals: She has agreed to keeping a food journal and adhering to recommended goals of 1200-1500 calories and 95 grams  of protein.   Exercise goals: Older adults should follow the adult guidelines. When older adults cannot meet the adult guidelines, they should be as physically active as their abilities and conditions will allow.  Older adults should do exercises that maintain or improve balance if they are at risk of falling.   Behavioral modification strategies: increasing lean protein intake, decreasing simple carbohydrates, and increasing vegetables.  [name] has agreed to follow-up with our clinic in 3 weeks. She was informed of the importance of frequent follow-up visits to maximize her success with intensive lifestyle modifications for her multiple health conditions.   Objective:   Blood pressure 110/75, pulse 73, temperature 98.3 F (36.8 C), temperature source Oral, height 5' 6"  (1.676 m), weight 268 lb (121.6 kg), SpO2 92 %. Body mass index is 43.26 kg/m.  General: Cooperative, alert, well developed, in no acute distress. HEENT: Conjunctivae and lids unremarkable. Cardiovascular: Regular rhythm.  Lungs: Normal work of breathing. Neurologic: No focal deficits.   Lab Results  Component Value Date   CREATININE 0.9 04/19/2020   BUN 26 (A) 04/19/2020   NA 142 04/19/2020   K 4.6 04/19/2020   CL 106 04/19/2020   CO2 31 (A) 04/19/2020   Lab Results  Component Value Date   ALT 25 04/19/2020   AST 40 (A) 04/19/2020   ALKPHOS 60 12/30/2019   BILITOT 0.8 06/18/2019   Lab Results  Component Value Date   HGBA1C 5.9 03/12/2020   HGBA1C 7.3 09/22/2019   HGBA1C 6.5 (H) 07/28/2018   HGBA1C 10.1 (H) 09/08/2012   HGBA1C 10.1 (H) 09/07/2012   Lab Results  Component Value Date   TSH 3.45 12/13/2019   Lab Results  Component Value Date   CHOL 95 12/13/2019   HDL 44 12/13/2019   LDLCALC 29 12/13/2019   TRIG 123 12/13/2019   CHOLHDL 2.8 08/19/2017   Lab Results  Component Value Date   WBC 4.8 06/18/2019   HGB 13.8 06/18/2019   HCT 43.8 06/18/2019   MCV 83.3 06/18/2019   PLT PLATELET  CLUMPS NOTED ON SMEAR, UNABLE TO ESTIMATE 06/18/2019   Lab Results  Component Value Date   IRON 39 08/19/2017   TIBC 404 08/19/2017   Obesity Behavioral Intervention:   Approximately 15 minutes were spent on the discussion below.  ASK: We discussed the diagnosis of obesity with [name] today and Makalya agreed to give [name] permission to discuss obesity behavioral modification therapy today.  ASSESS: Evonna has the diagnosis of obesity and her BMI today is 43.4. Kathyjo is in the action stage of change.   ADVISE: Kaprice was educated on the multiple health risks of obesity as well as the benefit of weight loss to improve her health. She was advised of the need for long term treatment and the importance of lifestyle modifications to improve her current health and to decrease her risk of future health problems.  AGREE: Multiple dietary modification options and treatment options were discussed and Ardenia agreed to follow the recommendations documented in the above note.  ARRANGE: Memori was educated on the importance  of frequent visits to treat obesity as outlined per CMS and USPSTF guidelines and agreed to schedule her next follow up appointment today.  Attestation Statements:   Reviewed by clinician on day of visit: allergies, medications, problem list, medical history, surgical history, family history, social history, and previous encounter notes.  I, Water quality scientist, CMA, am acting as transcriptionist for Briscoe Deutscher, DO  I have reviewed the above documentation for accuracy and completeness, and I agree with the above. Briscoe Deutscher, DO

## 2020-08-27 ENCOUNTER — Other Ambulatory Visit: Payer: Self-pay

## 2020-08-27 ENCOUNTER — Encounter (INDEPENDENT_AMBULATORY_CARE_PROVIDER_SITE_OTHER): Payer: Self-pay | Admitting: Family Medicine

## 2020-08-27 ENCOUNTER — Ambulatory Visit (INDEPENDENT_AMBULATORY_CARE_PROVIDER_SITE_OTHER): Payer: Medicare Other | Admitting: Family Medicine

## 2020-08-27 VITALS — BP 93/63 | HR 97 | Temp 98.3°F | Ht 66.0 in | Wt 262.0 lb

## 2020-08-27 DIAGNOSIS — Z6841 Body Mass Index (BMI) 40.0 and over, adult: Secondary | ICD-10-CM | POA: Diagnosis not present

## 2020-08-27 DIAGNOSIS — E1169 Type 2 diabetes mellitus with other specified complication: Secondary | ICD-10-CM

## 2020-08-27 DIAGNOSIS — E039 Hypothyroidism, unspecified: Secondary | ICD-10-CM | POA: Diagnosis not present

## 2020-08-27 DIAGNOSIS — E1159 Type 2 diabetes mellitus with other circulatory complications: Secondary | ICD-10-CM

## 2020-08-27 DIAGNOSIS — K279 Peptic ulcer, site unspecified, unspecified as acute or chronic, without hemorrhage or perforation: Secondary | ICD-10-CM

## 2020-08-27 DIAGNOSIS — Z794 Long term (current) use of insulin: Secondary | ICD-10-CM

## 2020-08-27 DIAGNOSIS — I152 Hypertension secondary to endocrine disorders: Secondary | ICD-10-CM | POA: Diagnosis not present

## 2020-08-27 MED ORDER — OZEMPIC (1 MG/DOSE) 4 MG/3ML ~~LOC~~ SOPN: PEN_INJECTOR | SUBCUTANEOUS | 2 refills | Status: DC

## 2020-08-28 MED ORDER — HYDROCHLOROTHIAZIDE 12.5 MG PO CAPS: 12.5000 mg | ORAL_CAPSULE | Freq: Every day | ORAL | 0 refills | Status: DC

## 2020-09-01 ENCOUNTER — Other Ambulatory Visit (INDEPENDENT_AMBULATORY_CARE_PROVIDER_SITE_OTHER): Payer: Self-pay | Admitting: Family Medicine

## 2020-09-01 DIAGNOSIS — E038 Other specified hypothyroidism: Secondary | ICD-10-CM

## 2020-09-04 NOTE — Progress Notes (Signed)
Chief Complaint:   OBESITY Katherine [name] is here to discuss her progress with her obesity treatment plan along with follow-up of her obesity related diagnoses.   Today's visit was #: 14 Starting weight: 293 lbs Starting date: 10/13/2019 Today's weight: 262 lbs Today's date: 04/29/2020 Weight change since last visit: 6 lbs Total lbs lost to date: 31 lbs Body mass index is 42.29 kg/m.  Total weight loss percentage to date: -10.58%  Interim History:  Katherine [name] is back on levothyroxine and Zestoretic regularly.  Her blood pressure has been low.  She says she feels dehydrated.  Current Meal Plan: keeping a food journal and adhering to recommended goals of 1200-1500 calories and 95 grams of protein for 40% of the time.  Current Exercise Plan: None. Current Anti-Obesity Medications: Ozempic 1 mg twice weekly. Side effects: None.  Assessment/Plan:   Meds ordered this encounter  Medications   Semaglutide, 1 MG/DOSE, (OZEMPIC, 1 MG/DOSE,) 4 MG/3ML SOPN    Sig: 1 mg SQ every Monday and Thursday    Dispense:  18 mL    Refill:  2   hydrochlorothiazide (MICROZIDE) 12.5 MG capsule    Sig: Take 1 capsule (12.5 mg total) by mouth daily.    Dispense:  30 capsule    Refill:  0    1. Hypertension associated with type 2 diabetes mellitus ([facility]) Low today. Medications: Bystolic 10 mg daily, Zestoretic 10-12.5 mg daily.   Plan: Avoid buying foods that are: processed, frozen, or prepackaged to avoid excess salt. We will watch for signs of hypotension as she continues lifestyle modifications.  Stop Zestoretic and start HCTZ 12.5 mg daily.  BP Readings from Last 3 Encounters:  08/27/20 93/63  08/06/20 110/75  07/23/20 103/68   Lab Results  Component Value Date   CREATININE 0.9 04/19/2020   - Start hydrochlorothiazide (MICROZIDE) 12.5 MG capsule; Take 1 capsule (12.5 mg total) by mouth daily.  Dispense: 30 capsule; Refill: 0  2. Type 2 diabetes mellitus with other specified complication,  with long-term current use of insulin (HCC) Diabetes Mellitus: Not optimized. Medication: [name] 50 units daily, Ozempic 1 mg twice weekly. Issues reviewed: blood sugar goals, complications of diabetes mellitus, hypoglycemia prevention and treatment, exercise, and nutrition.   Plan:  Continue medications. The patient will continue to focus on protein-rich, low simple carbohydrate foods. We reviewed the importance of hydration, regular exercise for stress reduction, and restorative sleep.   Lab Results  Component Value Date   HGBA1C 5.9 03/12/2020   HGBA1C 7.3 09/22/2019   HGBA1C 6.5 (H) 07/28/2018   Lab Results  Component Value Date   MICROALBUR 2.86 04/19/2020   LDLCALC 29 12/13/2019   CREATININE 0.9 04/19/2020   - Refill Semaglutide, 1 MG/DOSE, (OZEMPIC, 1 MG/DOSE,) 4 MG/3ML SOPN; 1 mg SQ every Monday and Thursday  Dispense: 18 mL; Refill: 2  3. PUD (peptic ulcer disease) Recent EGD results: erythematous mucosa in the gastric fundus and gastric body; non-bleeding gastric ulcers with a clean ulcer base ([place] Class III). Patient on bisphosphonate. Consider change to Prolia.   4. Acquired hypothyroidism Course: Controlled. Medication: levothyroxine 25 mcg daily.   Plan: Patient was instructed not to take MVM or iron within 4 hours of taking thyroid medications.  We will continue to monitor alongside Endocrinology/PCP as it relates to her weight loss journey.   Lab Results  Component Value Date   TSH 3.45 12/13/2019   5. Obesity, current BMI 42.4  Course: Katherine [name] is currently in the action stage  of change. As such, her goal is to continue with weight loss efforts.   Nutrition goals: She has agreed to keeping a food journal and adhering to recommended goals of 1200-1500 calories and 40 grams of protein.   Exercise goals:  As is.  Behavioral modification strategies: increasing lean protein intake, decreasing simple carbohydrates, and decreasing sodium intake.  Katherine [name] has  agreed to follow-up with our clinic in 3-4 weeks. She was informed of the importance of frequent follow-up visits to maximize her success with intensive lifestyle modifications for her multiple health conditions.   Objective:   Blood pressure 93/63, pulse 97, temperature 98.3 F (36.8 C), temperature source Oral, height 5' 6"  (1.676 m), weight 262 lb (118.8 kg), SpO2 94 %. Body mass index is 42.29 kg/m.  General: Cooperative, alert, well developed, in no acute distress. HEENT: Conjunctivae and lids unremarkable. Cardiovascular: Regular rhythm.  Lungs: Normal work of breathing. Neurologic: No focal deficits.   Lab Results  Component Value Date   CREATININE 0.9 04/19/2020   BUN 26 (A) 04/19/2020   NA 142 04/19/2020   K 4.6 04/19/2020   CL 106 04/19/2020   CO2 31 (A) 04/19/2020   Lab Results  Component Value Date   ALT 25 04/19/2020   AST 40 (A) 04/19/2020   ALKPHOS 60 12/30/2019   BILITOT 0.8 06/18/2019   Lab Results  Component Value Date   HGBA1C 5.9 03/12/2020   HGBA1C 7.3 09/22/2019   HGBA1C 6.5 (H) 07/28/2018   HGBA1C 10.1 (H) 09/08/2012   HGBA1C 10.1 (H) 09/07/2012   Lab Results  Component Value Date   TSH 3.45 12/13/2019   Lab Results  Component Value Date   CHOL 95 12/13/2019   HDL 44 12/13/2019   LDLCALC 29 12/13/2019   TRIG 123 12/13/2019   CHOLHDL 2.8 08/19/2017   Lab Results  Component Value Date   WBC 4.8 06/18/2019   HGB 13.8 06/18/2019   HCT 43.8 06/18/2019   MCV 83.3 06/18/2019   PLT PLATELET CLUMPS NOTED ON SMEAR, UNABLE TO ESTIMATE 06/18/2019   Lab Results  Component Value Date   IRON 39 08/19/2017   TIBC 404 08/19/2017   Obesity Behavioral Intervention:   Approximately 15 minutes were spent on the discussion below.  ASK: We discussed the diagnosis of obesity with Katherine [name] today and Katherine [name] agreed to give [name] permission to discuss obesity behavioral modification therapy today.  ASSESS: Katherine [name] has the diagnosis of obesity and  her BMI today is 42.4. Katherine [name] is in the action stage of change.   ADVISE: Katherine [name] was educated on the multiple health risks of obesity as well as the benefit of weight loss to improve her health. She was advised of the need for long term treatment and the importance of lifestyle modifications to improve her current health and to decrease her risk of future health problems.  AGREE: Multiple dietary modification options and treatment options were discussed and Katherine [name] agreed to follow the recommendations documented in the above note.  ARRANGE: Katherine [name] was educated on the importance of frequent visits to treat obesity as outlined per CMS and USPSTF guidelines and agreed to schedule her next follow up appointment today.  Attestation Statements:   Reviewed by clinician on day of visit: allergies, medications, problem list, medical history, surgical history, family history, social history, and previous encounter notes.  I, Water quality scientist, CMA, am acting as transcriptionist for Briscoe Deutscher, DO  I have reviewed the above documentation for accuracy and completeness, and I agree with the  above. -  Briscoe Deutscher, DO

## 2020-09-13 ENCOUNTER — Encounter: Payer: Medicare Other | Admitting: Registered Nurse

## 2020-09-18 ENCOUNTER — Encounter (INDEPENDENT_AMBULATORY_CARE_PROVIDER_SITE_OTHER): Payer: Self-pay | Admitting: Family Medicine

## 2020-09-18 ENCOUNTER — Ambulatory Visit (INDEPENDENT_AMBULATORY_CARE_PROVIDER_SITE_OTHER): Payer: Medicare Other | Admitting: Family Medicine

## 2020-09-18 ENCOUNTER — Other Ambulatory Visit: Payer: Self-pay

## 2020-09-18 VITALS — BP 111/75 | HR 87 | Temp 98.4°F | Ht 66.0 in | Wt 267.0 lb

## 2020-09-18 DIAGNOSIS — I152 Hypertension secondary to endocrine disorders: Secondary | ICD-10-CM

## 2020-09-18 DIAGNOSIS — E1169 Type 2 diabetes mellitus with other specified complication: Secondary | ICD-10-CM | POA: Diagnosis not present

## 2020-09-18 DIAGNOSIS — F5101 Primary insomnia: Secondary | ICD-10-CM | POA: Diagnosis not present

## 2020-09-18 DIAGNOSIS — Z6841 Body Mass Index (BMI) 40.0 and over, adult: Secondary | ICD-10-CM | POA: Diagnosis not present

## 2020-09-18 DIAGNOSIS — E1159 Type 2 diabetes mellitus with other circulatory complications: Secondary | ICD-10-CM | POA: Diagnosis not present

## 2020-09-18 DIAGNOSIS — Z794 Long term (current) use of insulin: Secondary | ICD-10-CM

## 2020-09-19 MED ORDER — HYDROCHLOROTHIAZIDE 12.5 MG PO CAPS: 12.5000 mg | ORAL_CAPSULE | Freq: Every day | ORAL | 0 refills | Status: DC

## 2020-09-19 MED ORDER — TRAZODONE HCL 50 MG PO TABS: 25.0000 mg | ORAL_TABLET | Freq: Every evening | ORAL | 3 refills | Status: DC | PRN

## 2020-09-19 MED ORDER — OZEMPIC (1 MG/DOSE) 4 MG/3ML ~~LOC~~ SOPN: PEN_INJECTOR | SUBCUTANEOUS | 2 refills | Status: DC

## 2020-09-20 ENCOUNTER — Encounter: Payer: Medicare Other | Admitting: Registered Nurse

## 2020-09-21 ENCOUNTER — Other Ambulatory Visit: Payer: Self-pay

## 2020-09-21 ENCOUNTER — Encounter: Payer: Medicare Other | Attending: Registered Nurse | Admitting: Registered Nurse

## 2020-09-21 ENCOUNTER — Encounter: Payer: Self-pay | Admitting: Registered Nurse

## 2020-09-21 VITALS — BP 116/78 | HR 82 | Temp 98.3°F | Ht 66.0 in | Wt 269.8 lb

## 2020-09-21 DIAGNOSIS — Z79891 Long term (current) use of opiate analgesic: Secondary | ICD-10-CM | POA: Insufficient documentation

## 2020-09-21 DIAGNOSIS — Z5181 Encounter for therapeutic drug level monitoring: Secondary | ICD-10-CM | POA: Diagnosis not present

## 2020-09-21 DIAGNOSIS — R202 Paresthesia of skin: Secondary | ICD-10-CM | POA: Insufficient documentation

## 2020-09-21 DIAGNOSIS — S8412XS Injury of peroneal nerve at lower leg level, left leg, sequela: Secondary | ICD-10-CM | POA: Diagnosis not present

## 2020-09-21 DIAGNOSIS — M47816 Spondylosis without myelopathy or radiculopathy, lumbar region: Secondary | ICD-10-CM | POA: Diagnosis not present

## 2020-09-21 DIAGNOSIS — G894 Chronic pain syndrome: Secondary | ICD-10-CM

## 2020-09-21 DIAGNOSIS — M1711 Unilateral primary osteoarthritis, right knee: Secondary | ICD-10-CM | POA: Diagnosis not present

## 2020-09-21 MED ORDER — OXYCODONE HCL 10 MG PO TABS: 10.0000 mg | ORAL_TABLET | Freq: Three times a day (TID) | ORAL | 0 refills | Status: DC | PRN
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
## 2020-09-24 DIAGNOSIS — M25562 Pain in left knee: Secondary | ICD-10-CM | POA: Diagnosis not present

## 2020-09-24 DIAGNOSIS — M25561 Pain in right knee: Secondary | ICD-10-CM | POA: Diagnosis not present

## 2020-09-25 ENCOUNTER — Encounter (INDEPENDENT_AMBULATORY_CARE_PROVIDER_SITE_OTHER): Payer: Self-pay | Admitting: Family Medicine

## 2020-09-25 LAB — TOXASSURE SELECT,+ANTIDEPR,UR

## 2020-09-25 NOTE — Telephone Encounter (Signed)
Last OV with Dr Wallace 

## 2020-09-26 ENCOUNTER — Other Ambulatory Visit (INDEPENDENT_AMBULATORY_CARE_PROVIDER_SITE_OTHER): Payer: Self-pay | Admitting: Family Medicine

## 2020-09-26 DIAGNOSIS — E1159 Type 2 diabetes mellitus with other circulatory complications: Secondary | ICD-10-CM

## 2020-09-27 ENCOUNTER — Telehealth: Payer: Self-pay | Admitting: *Deleted

## 2020-09-27 MED ORDER — HYDROCHLOROTHIAZIDE 12.5 MG PO CAPS: 12.5000 mg | ORAL_CAPSULE | Freq: Every day | ORAL | 0 refills | Status: DC

## 2020-09-27 NOTE — Telephone Encounter (Signed)
Pt last seen by Dr. Wallace.  

## 2020-09-27 NOTE — Telephone Encounter (Signed)
Urine drug screen for this encounter is consistent for prescribed medication 

## 2020-09-28 DIAGNOSIS — M810 Age-related osteoporosis without current pathological fracture: Secondary | ICD-10-CM | POA: Diagnosis not present

## 2020-09-28 DIAGNOSIS — Z794 Long term (current) use of insulin: Secondary | ICD-10-CM | POA: Diagnosis not present

## 2020-09-28 DIAGNOSIS — E1165 Type 2 diabetes mellitus with hyperglycemia: Secondary | ICD-10-CM | POA: Diagnosis not present

## 2020-09-28 NOTE — Progress Notes (Signed)
Chief Complaint:   OBESITY Katherine [name] is here to discuss her progress with her obesity treatment plan along with follow-up of her obesity related diagnoses.   Today's visit was #: 15 Starting weight: 293 lbs Starting date: 10/13/2019 Today's weight: 267 lbs Today's date: 09/18/2020 Weight change since last visit: +5 lbs Total lbs lost to date: 26 lbs Body mass index is 43.09 kg/m.  Total weight loss percentage to date: -8.87%  Interim History:  Katherine [name] is worried about her brother.  She endorses increased emotional eating. Current Meal Plan: keeping a food journal and adhering to recommended goals of 1200-1500 calories and 40 grams of protein for 50% of the time.  Current Exercise Plan: Increased walking. Current Anti-Obesity Medications: Ozempic 1 mg subcutaneously twice weekly. Side effects: None.  Assessment/Plan:    1. Hypertension associated with type 2 diabetes mellitus ([place]) At goal. Medications: HCTZ 99.3 mg daily, Bystolic 10 mg daily.   Plan: Avoid buying foods that are: processed, frozen, or prepackaged to avoid excess salt. We will watch for signs of hypotension as she continues lifestyle modifications.  BP Readings from Last 3 Encounters:  09/21/20 116/78  09/18/20 111/75  08/27/20 93/63   Lab Results  Component Value Date   CREATININE 0.9 04/19/2020   2. Type 2 diabetes mellitus with other specified complication, with long-term current use of insulin (HCC) Diabetes Mellitus: Improving, but not optimized. Medication: Ozempic 1 mg subcutaneously twice daily. Issues reviewed: blood sugar goals, complications of diabetes mellitus, hypoglycemia prevention and treatment, exercise, and nutrition.   Plan:  Refill Ozempic today, as per below. The patient will continue to focus on protein-rich, low simple carbohydrate foods. We reviewed the importance of hydration, regular exercise for stress reduction, and restorative sleep.   Lab Results  Component Value Date    HGBA1C 5.9 03/12/2020   HGBA1C 7.3 09/22/2019   HGBA1C 6.5 (H) 07/28/2018   Lab Results  Component Value Date   MICROALBUR 2.86 04/19/2020   LDLCALC 29 12/13/2019   CREATININE 0.9 04/19/2020   - Refill Semaglutide, 1 MG/DOSE, (OZEMPIC, 1 MG/DOSE,) 4 MG/3ML SOPN; 1 mg SQ every Monday and Thursday  Dispense: 18 mL; Refill: 2  3. Primary insomnia This is moderately controlled. Current treatment: trazodone 25-50 mg as needed at bedtime.  Plan  Will refill trazodone today.: Recommend sleep hygiene measures including regular sleep schedule, optimal sleep environment, and relaxing presleep rituals.   - Refill traZODone (DESYREL) 50 MG tablet; Take 0.5-1 tablets (25-50 mg total) by mouth at bedtime as needed for sleep.  Dispense: 30 tablet; Refill: 3  4. Obesity with current BMI of 43.2  Course: Katherine [name] is currently in the action stage of change. As such, her goal is to continue with weight loss efforts.   Nutrition goals: She has agreed to keeping a food journal and adhering to recommended goals of 1200-1500 calories and 40 grams of protein.   Exercise goals:  As is.  Behavioral modification strategies: increasing lean protein intake, decreasing simple carbohydrates, increasing vegetables, and increasing water intake.  Katherine [name] has agreed to follow-up with our clinic in 4 weeks. She was informed of the importance of frequent follow-up visits to maximize her success with intensive lifestyle modifications for her multiple health conditions.   Objective:   Blood pressure 111/75, pulse 87, temperature 98.4 F (36.9 C), temperature source Oral, height 5' 6"  (1.676 m), weight 267 lb (121.1 kg), SpO2 92 %. Body mass index is 43.09 kg/m.  General: Cooperative, alert, well developed,  in no acute distress. HEENT: Conjunctivae and lids unremarkable. Cardiovascular: Regular rhythm.  Lungs: Normal work of breathing. Neurologic: No focal deficits.   Lab Results  Component Value Date    CREATININE 0.9 04/19/2020   BUN 26 (A) 04/19/2020   NA 142 04/19/2020   K 4.6 04/19/2020   CL 106 04/19/2020   CO2 31 (A) 04/19/2020   Lab Results  Component Value Date   ALT 25 04/19/2020   AST 40 (A) 04/19/2020   ALKPHOS 60 12/30/2019   BILITOT 0.8 06/18/2019   Lab Results  Component Value Date   HGBA1C 5.9 03/12/2020   HGBA1C 7.3 09/22/2019   HGBA1C 6.5 (H) 07/28/2018   HGBA1C 10.1 (H) 09/08/2012   HGBA1C 10.1 (H) 09/07/2012   Lab Results  Component Value Date   TSH 3.45 12/13/2019   Lab Results  Component Value Date   CHOL 95 12/13/2019   HDL 44 12/13/2019   LDLCALC 29 12/13/2019   TRIG 123 12/13/2019   CHOLHDL 2.8 08/19/2017   Lab Results  Component Value Date   WBC 4.8 06/18/2019   HGB 13.8 06/18/2019   HCT 43.8 06/18/2019   MCV 83.3 06/18/2019   PLT PLATELET CLUMPS NOTED ON SMEAR, UNABLE TO ESTIMATE 06/18/2019   Lab Results  Component Value Date   IRON 39 08/19/2017   TIBC 404 08/19/2017   Obesity Behavioral Intervention:   Approximately 15 minutes were spent on the discussion below.  ASK: We discussed the diagnosis of obesity with Katherine [name] today and Katherine [name] agreed to give [name] permission to discuss obesity behavioral modification therapy today.  ASSESS: Katherine [name] has the diagnosis of obesity and her BMI today is 43.2. Katherine [name] is in the action stage of change.   ADVISE: Katherine [name] was educated on the multiple health risks of obesity as well as the benefit of weight loss to improve her health. She was advised of the need for long term treatment and the importance of lifestyle modifications to improve her current health and to decrease her risk of future health problems.  AGREE: Multiple dietary modification options and treatment options were discussed and Katherine [name] agreed to follow the recommendations documented in the above note.  ARRANGE: Katherine [name] was educated on the importance of frequent visits to treat obesity as outlined per CMS and  USPSTF guidelines and agreed to schedule her next follow up appointment today.  Attestation Statements:   Reviewed by clinician on day of visit: allergies, medications, problem list, medical history, surgical history, family history, social history, and previous encounter notes.  I, Water quality scientist, CMA, am acting as transcriptionist for Briscoe Deutscher, DO  I have reviewed the above documentation for accuracy and completeness, and I agree with the above. Briscoe Deutscher, DO

## 2020-10-01 NOTE — Telephone Encounter (Signed)
Prior authorization has been started, waiting on response.

## 2020-10-02 ENCOUNTER — Encounter (INDEPENDENT_AMBULATORY_CARE_PROVIDER_SITE_OTHER): Payer: Self-pay | Admitting: Family Medicine

## 2020-10-02 ENCOUNTER — Encounter (INDEPENDENT_AMBULATORY_CARE_PROVIDER_SITE_OTHER): Payer: Self-pay

## 2020-10-02 ENCOUNTER — Other Ambulatory Visit (INDEPENDENT_AMBULATORY_CARE_PROVIDER_SITE_OTHER): Payer: Self-pay | Admitting: Family Medicine

## 2020-10-02 DIAGNOSIS — E038 Other specified hypothyroidism: Secondary | ICD-10-CM

## 2020-10-02 NOTE — Telephone Encounter (Signed)
Msg sent to pt

## 2020-10-06 ENCOUNTER — Other Ambulatory Visit: Payer: Self-pay | Admitting: Registered Nurse

## 2020-10-06 DIAGNOSIS — M170 Bilateral primary osteoarthritis of knee: Secondary | ICD-10-CM

## 2020-10-06 DIAGNOSIS — G5712 Meralgia paresthetica, left lower limb: Secondary | ICD-10-CM

## 2020-10-06 DIAGNOSIS — M19172 Post-traumatic osteoarthritis, left ankle and foot: Secondary | ICD-10-CM

## 2020-10-09 ENCOUNTER — Other Ambulatory Visit: Payer: Self-pay

## 2020-10-09 ENCOUNTER — Ambulatory Visit (INDEPENDENT_AMBULATORY_CARE_PROVIDER_SITE_OTHER): Payer: Medicare Other | Admitting: Family Medicine

## 2020-10-09 ENCOUNTER — Encounter (INDEPENDENT_AMBULATORY_CARE_PROVIDER_SITE_OTHER): Payer: Self-pay | Admitting: Family Medicine

## 2020-10-09 VITALS — BP 115/74 | HR 84 | Temp 98.2°F | Ht 66.0 in | Wt 274.0 lb

## 2020-10-09 DIAGNOSIS — E1159 Type 2 diabetes mellitus with other circulatory complications: Secondary | ICD-10-CM

## 2020-10-09 DIAGNOSIS — Z6841 Body Mass Index (BMI) 40.0 and over, adult: Secondary | ICD-10-CM | POA: Diagnosis not present

## 2020-10-09 DIAGNOSIS — E1169 Type 2 diabetes mellitus with other specified complication: Secondary | ICD-10-CM | POA: Diagnosis not present

## 2020-10-09 DIAGNOSIS — K5909 Other constipation: Secondary | ICD-10-CM | POA: Diagnosis not present

## 2020-10-09 DIAGNOSIS — Z794 Long term (current) use of insulin: Secondary | ICD-10-CM | POA: Diagnosis not present

## 2020-10-09 DIAGNOSIS — I152 Hypertension secondary to endocrine disorders: Secondary | ICD-10-CM | POA: Diagnosis not present

## 2020-10-09 MED ORDER — SEMAGLUTIDE (2 MG/DOSE) 8 MG/3ML ~~LOC~~ SOPN: 2.0000 mg | PEN_INJECTOR | SUBCUTANEOUS | 1 refills | Status: DC

## 2020-10-09 MED ORDER — LINACLOTIDE 145 MCG PO CAPS: 145.0000 ug | ORAL_CAPSULE | Freq: Every day | ORAL | 0 refills | Status: DC

## 2020-10-09 MED ORDER — HYDROCHLOROTHIAZIDE 12.5 MG PO CAPS: 12.5000 mg | ORAL_CAPSULE | Freq: Every day | ORAL | 0 refills | Status: DC

## 2020-10-10 NOTE — Progress Notes (Signed)
Chief Complaint:   OBESITY Katherine [name] is here to discuss her progress with her obesity treatment plan along with follow-up of her obesity related diagnoses.   Today's visit was #: 57 Starting weight: 293 lbs Starting date: 10/13/2019 Today's weight: 274 lbs Today's date: 10/09/2020 Weight change since last visit: 7 lbs Total lbs lost to date: 19 lbs Body mass index is 44.22 kg/m.  Total weight loss percentage to date: -6.48%  Interim History:  Katherine [name] says that her home blood pressures range 113-137/60s-80s.  She has not had Ozempic in 1 month due to pharmacy denial.  She is very worried about two family members that have life threatening health conditions.  Current Meal Plan: keeping a food journal and adhering to recommended goals of 1200-1500 calories and 40 grams of protein for 40% of the time.  Current Exercise Plan: Increased walking.  Assessment/Plan:   1. Hypertension associated with type 2 diabetes mellitus ([place]) At goal. Medications: HCTZ 19.1 mg daily, Bystolic 10 mg daily.   Plan: Avoid buying foods that are: processed, frozen, or prepackaged to avoid excess salt. We will watch for signs of hypotension as she continues lifestyle modifications.  BP Readings from Last 3 Encounters:  10/09/20 115/74  09/21/20 116/78  09/18/20 111/75   Lab Results  Component Value Date   CREATININE 0.9 04/19/2020   - Refill hydrochlorothiazide (MICROZIDE) 12.5 MG capsule; Take 1 capsule (12.5 mg total) by mouth daily.  Dispense: 30 capsule; Refill: 0  2. Chronic constipation This problem is controlled. [name] was informed that a decrease in bowel movement frequency is normal while losing weight, but stools should not be hard or painful.  Counseling: Getting to Good Bowel Health: Your goal is to have one soft bowel movement each day. Drink at least 8 glasses of water each day. Eat plenty of fiber (goal is over 30 grams each day). It is best to get most of your fiber from  dietary sources which includes leafy green vegetables, fresh fruit, and whole grains. You may need to add fiber with the help of OTC fiber supplements. These include Metamucil, Citrucel, and Benefiber. If you are still having trouble, try adding an osmotic laxative such as Miralax. If all of these changes do not work, Cabin crew.   - Refill linaclotide (LINZESS) 145 MCG CAPS capsule; Take 1 capsule (145 mcg total) by mouth daily before breakfast.  Dispense: 90 capsule; Refill: 0  3. Type 2 diabetes mellitus with other specified complication, with long-term current use of insulin (HCC) Diabetes Mellitus: At goal. Medication: Ozempic 1 mg subcutaneously weekly. Issues reviewed: blood sugar goals, complications of diabetes mellitus, hypoglycemia prevention and treatment, exercise, and nutrition.   Plan: The patient will continue to focus on protein-rich, low simple carbohydrate foods. We reviewed the importance of hydration, regular exercise for stress reduction, and restorative sleep.   Lab Results  Component Value Date   HGBA1C 5.9 03/12/2020   HGBA1C 7.3 09/22/2019   HGBA1C 6.5 (H) 07/28/2018   Lab Results  Component Value Date   MICROALBUR 2.86 04/19/2020   LDLCALC 29 12/13/2019   CREATININE 0.9 04/19/2020   - Increase and refill Semaglutide, 2 MG/DOSE, 8 MG/3ML SOPN; Inject 2 mg as directed once a week.  Dispense: 3 mL; Refill: 1  4. Obesity with current BMI of 44.3  Course: Katherine [name] is currently in the action stage of change. As such, her goal is to continue with weight loss efforts.   Nutrition goals: She has agreed  to keeping a food journal and adhering to recommended goals of 1200-1500 calories and 40 grams of protein.   Exercise goals:  As is.  Behavioral modification strategies: increasing water intake and emotional eating strategies.  Katherine [name] has agreed to follow-up with our clinic in 4 weeks. She was informed of the importance of frequent follow-up visits to  maximize her success with intensive lifestyle modifications for her multiple health conditions.   Objective:   Blood pressure 115/74, pulse 84, temperature 98.2 F (36.8 C), temperature source Oral, height 5' 6"  (1.676 m), weight 274 lb (124.3 kg), SpO2 91 %. Body mass index is 44.22 kg/m.  General: Cooperative, alert, well developed, in no acute distress. HEENT: Conjunctivae and lids unremarkable. Cardiovascular: Regular rhythm.  Lungs: Normal work of breathing. Neurologic: No focal deficits.   Lab Results  Component Value Date   CREATININE 0.9 04/19/2020   BUN 26 (A) 04/19/2020   NA 142 04/19/2020   K 4.6 04/19/2020   CL 106 04/19/2020   CO2 31 (A) 04/19/2020   Lab Results  Component Value Date   ALT 25 04/19/2020   AST 40 (A) 04/19/2020   ALKPHOS 60 12/30/2019   BILITOT 0.8 06/18/2019   Lab Results  Component Value Date   HGBA1C 5.9 03/12/2020   HGBA1C 7.3 09/22/2019   HGBA1C 6.5 (H) 07/28/2018   HGBA1C 10.1 (H) 09/08/2012   HGBA1C 10.1 (H) 09/07/2012   Lab Results  Component Value Date   TSH 3.45 12/13/2019   Lab Results  Component Value Date   CHOL 95 12/13/2019   HDL 44 12/13/2019   LDLCALC 29 12/13/2019   TRIG 123 12/13/2019   CHOLHDL 2.8 08/19/2017   Lab Results  Component Value Date   WBC 4.8 06/18/2019   HGB 13.8 06/18/2019   HCT 43.8 06/18/2019   MCV 83.3 06/18/2019   PLT PLATELET CLUMPS NOTED ON SMEAR, UNABLE TO ESTIMATE 06/18/2019   Lab Results  Component Value Date   IRON 39 08/19/2017   TIBC 404 08/19/2017   Obesity Behavioral Intervention:   Approximately 15 minutes were spent on the discussion below.  ASK: We discussed the diagnosis of obesity with Katherine [name] today and Katherine [name] agreed to give [name] permission to discuss obesity behavioral modification therapy today.  ASSESS: Katherine [name] has the diagnosis of obesity and her BMI today is 44.3. Katherine [name] is in the action stage of change.   ADVISE: Katherine [name] was educated on the  multiple health risks of obesity as well as the benefit of weight loss to improve her health. She was advised of the need for long term treatment and the importance of lifestyle modifications to improve her current health and to decrease her risk of future health problems.  AGREE: Multiple dietary modification options and treatment options were discussed and Shanira agreed to follow the recommendations documented in the above note.  ARRANGE: Kynsleigh was educated on the importance of frequent visits to treat obesity as outlined per CMS and USPSTF guidelines and agreed to schedule her next follow up appointment today.  Attestation Statements:   Reviewed by clinician on day of visit: allergies, medications, problem list, medical history, surgical history, family history, social history, and previous encounter notes.  I, Water quality scientist, CMA, am acting as transcriptionist for Briscoe Deutscher, DO  I have reviewed the above documentation for accuracy and completeness, and I agree with the above. Briscoe Deutscher, DO

## 2020-10-15 ENCOUNTER — Other Ambulatory Visit (INDEPENDENT_AMBULATORY_CARE_PROVIDER_SITE_OTHER): Payer: Self-pay | Admitting: Family Medicine

## 2020-10-15 ENCOUNTER — Encounter (INDEPENDENT_AMBULATORY_CARE_PROVIDER_SITE_OTHER): Payer: Self-pay | Admitting: Family Medicine

## 2020-10-15 DIAGNOSIS — E1169 Type 2 diabetes mellitus with other specified complication: Secondary | ICD-10-CM

## 2020-10-15 DIAGNOSIS — F5101 Primary insomnia: Secondary | ICD-10-CM

## 2020-10-15 NOTE — Telephone Encounter (Signed)
Dr.Wallace °

## 2020-10-16 MED ORDER — OZEMPIC (1 MG/DOSE) 4 MG/3ML ~~LOC~~ SOPN: 1.0000 mg | PEN_INJECTOR | SUBCUTANEOUS | 0 refills | Status: DC

## 2020-10-16 NOTE — Telephone Encounter (Signed)
[facility] ad 58m dose pens in stock. Sent RX for 1 mg twice weekly. Appeal approved 10/01/20-03/02/21 for 6 ML per 28 days.  Pt notified

## 2020-10-23 ENCOUNTER — Other Ambulatory Visit: Payer: Self-pay | Admitting: Nurse Practitioner

## 2020-10-23 DIAGNOSIS — K7581 Nonalcoholic steatohepatitis (NASH): Secondary | ICD-10-CM

## 2020-10-23 DIAGNOSIS — K7460 Unspecified cirrhosis of liver: Secondary | ICD-10-CM | POA: Diagnosis not present

## 2020-10-23 DIAGNOSIS — K7469 Other cirrhosis of liver: Secondary | ICD-10-CM | POA: Diagnosis not present

## 2020-10-23 DIAGNOSIS — K3189 Other diseases of stomach and duodenum: Secondary | ICD-10-CM | POA: Diagnosis not present

## 2020-10-23 DIAGNOSIS — K7290 Hepatic failure, unspecified without coma: Secondary | ICD-10-CM | POA: Diagnosis not present

## 2020-10-23 DIAGNOSIS — K766 Portal hypertension: Secondary | ICD-10-CM | POA: Diagnosis not present

## 2020-10-25 ENCOUNTER — Ambulatory Visit
Admission: RE | Admit: 2020-10-25 | Discharge: 2020-10-25 | Disposition: A | Discharge status: Home / Self Care | Payer: Self-pay | Source: Ambulatory Visit | Attending: Nurse Practitioner | Admitting: Nurse Practitioner

## 2020-10-25 DIAGNOSIS — K7460 Unspecified cirrhosis of liver: Secondary | ICD-10-CM

## 2020-10-25 DIAGNOSIS — K7689 Other specified diseases of liver: Secondary | ICD-10-CM | POA: Diagnosis not present

## 2020-10-25 DIAGNOSIS — N281 Cyst of kidney, acquired: Secondary | ICD-10-CM | POA: Diagnosis not present

## 2020-10-25 DIAGNOSIS — K7581 Nonalcoholic steatohepatitis (NASH): Secondary | ICD-10-CM

## 2020-10-25 DIAGNOSIS — Z9049 Acquired absence of other specified parts of digestive tract: Secondary | ICD-10-CM | POA: Diagnosis not present

## 2020-11-04 ENCOUNTER — Other Ambulatory Visit: Payer: Self-pay | Admitting: Physical Medicine & Rehabilitation

## 2020-11-04 DIAGNOSIS — M7121 Synovial cyst of popliteal space [Baker], right knee: Secondary | ICD-10-CM

## 2020-11-06 ENCOUNTER — Other Ambulatory Visit: Payer: Self-pay

## 2020-11-06 ENCOUNTER — Encounter (INDEPENDENT_AMBULATORY_CARE_PROVIDER_SITE_OTHER): Payer: Self-pay | Admitting: Family Medicine

## 2020-11-06 ENCOUNTER — Ambulatory Visit (INDEPENDENT_AMBULATORY_CARE_PROVIDER_SITE_OTHER): Payer: Medicare Other | Admitting: Family Medicine

## 2020-11-06 VITALS — BP 121/75 | HR 84 | Temp 98.3°F | Ht 66.0 in | Wt 272.0 lb

## 2020-11-06 DIAGNOSIS — K7460 Unspecified cirrhosis of liver: Secondary | ICD-10-CM

## 2020-11-06 DIAGNOSIS — E1169 Type 2 diabetes mellitus with other specified complication: Secondary | ICD-10-CM | POA: Diagnosis not present

## 2020-11-06 DIAGNOSIS — K7682 Hepatic encephalopathy: Secondary | ICD-10-CM

## 2020-11-06 DIAGNOSIS — K7290 Hepatic failure, unspecified without coma: Secondary | ICD-10-CM

## 2020-11-06 DIAGNOSIS — E1159 Type 2 diabetes mellitus with other circulatory complications: Secondary | ICD-10-CM

## 2020-11-06 DIAGNOSIS — Z794 Long term (current) use of insulin: Secondary | ICD-10-CM

## 2020-11-06 DIAGNOSIS — I152 Hypertension secondary to endocrine disorders: Secondary | ICD-10-CM

## 2020-11-06 DIAGNOSIS — F5101 Primary insomnia: Secondary | ICD-10-CM | POA: Diagnosis not present

## 2020-11-06 DIAGNOSIS — K7581 Nonalcoholic steatohepatitis (NASH): Secondary | ICD-10-CM | POA: Diagnosis not present

## 2020-11-06 DIAGNOSIS — Z6841 Body Mass Index (BMI) 40.0 and over, adult: Secondary | ICD-10-CM | POA: Diagnosis not present

## 2020-11-06 DIAGNOSIS — N281 Cyst of kidney, acquired: Secondary | ICD-10-CM | POA: Diagnosis not present

## 2020-11-06 MED ORDER — HYDROCHLOROTHIAZIDE 12.5 MG PO CAPS: 12.5000 mg | ORAL_CAPSULE | Freq: Every day | ORAL | 0 refills | Status: DC

## 2020-11-06 MED ORDER — OZEMPIC (1 MG/DOSE) 4 MG/3ML ~~LOC~~ SOPN
1.0000 mg | PEN_INJECTOR | SUBCUTANEOUS | 0 refills | Status: DC
Start: 2020-11-08 — End: 2020-12-04

## 2020-11-06 MED ORDER — TRAZODONE HCL 50 MG PO TABS: 25.0000 mg | ORAL_TABLET | Freq: Every evening | ORAL | 3 refills | Status: DC | PRN

## 2020-11-07 DIAGNOSIS — K7682 Hepatic encephalopathy: Secondary | ICD-10-CM | POA: Insufficient documentation

## 2020-11-07 DIAGNOSIS — K7290 Hepatic failure, unspecified without coma: Secondary | ICD-10-CM | POA: Insufficient documentation

## 2020-11-07 DIAGNOSIS — F5101 Primary insomnia: Secondary | ICD-10-CM | POA: Insufficient documentation

## 2020-11-07 DIAGNOSIS — N281 Cyst of kidney, acquired: Secondary | ICD-10-CM | POA: Insufficient documentation

## 2020-11-07 NOTE — Progress Notes (Signed)
Chief Complaint:   OBESITY Katherine [name] is here to discuss her progress with her obesity treatment plan along with follow-up of her obesity related diagnoses.   Today's visit was #: 30 Starting weight: 294 lbs Starting date: 10/13/2019 Today's weight: 272 lbs Today's date: 11/06/2020 Weight change since last visit: 2 lbs Total lbs lost to date: 22 lbs Body mass index is 43.9 kg/m.  Total weight loss percentage to date: -7.48%  Current Meal Plan: keeping a food journal and adhering to recommended goals of 1200-1500 calories and 40 grams of protein for 0% of the time.  Current Exercise Plan: Increased walking. Current Anti-Obesity Medications: Ozempic 1 mg subcutaneously weekly. Side effects: None.  Interim History:  Katherine [name] has hepatic encephalopathy and was prescribed lactulose on 8/24. She stopped Linzess.  Endorses watery stools with LLQ abdominal pain.  She increased her water intake.  She has liver cirrhosis due to NASH.  Blood sugar "okay".  Low 91, symptomatic.  Insulin increased to 50 units when taking lactulose.  Assessment/Plan:   1. Renal cyst, right Found on [name].  Size change minimal.   2. Hypertension associated with type 2 diabetes mellitus (Katherine [name]) At goal. Medications: HCTZ 16.1 mg daily, Bystolic 10 mg daily.   Plan:  Continue medications.  Will refill HCTZ today.  Avoid buying foods that are: processed, frozen, or prepackaged to avoid excess salt. We will watch for signs of hypotension as she continues lifestyle modifications.  BP Readings from Last 3 Encounters:  11/06/20 121/75  10/09/20 115/74  09/21/20 116/78   Lab Results  Component Value Date   CREATININE 0.9 04/19/2020   - Refill hydrochlorothiazide (MICROZIDE) 12.5 MG capsule; Take 1 capsule (12.5 mg total) by mouth daily.  Dispense: 30 capsule; Refill: 0  3. IDDM, with recent hyperglycemia, corrected by increasing insulin from 10 units to 50 units Diabetes Mellitus: Improving, but not optimized.  Medication: Ozempic 1 mg subcutaneously weekly, [name] 50 units daily. Issues reviewed: blood sugar goals, complications of diabetes mellitus, hypoglycemia prevention and treatment, exercise, and nutrition.   Plan:  Continue medications. Will refill Ozempic at the same dose today.  The patient will continue to focus on protein-rich, low simple carbohydrate foods. We reviewed the importance of hydration, regular exercise for stress reduction, and restorative sleep.   Lab Results  Component Value Date   HGBA1C 5.9 03/12/2020   HGBA1C 7.3 09/22/2019   HGBA1C 6.5 (H) 07/28/2018   Lab Results  Component Value Date   MICROALBUR 2.86 04/19/2020   LDLCALC 29 12/13/2019   CREATININE 0.9 04/19/2020   - Refill Semaglutide, 1 MG/DOSE, (OZEMPIC, 1 MG/DOSE,) 4 MG/3ML SOPN; Inject 1 mg into the skin 2 (two) times a week.  Dispense: 6 mL; Refill: 0  4. Primary insomnia This is well controlled.  Current treatment: trazodone 25-50 mg at bedtime as needed for sleep.  Plan:  Continue trazodone.  Will refill today.  Recommend sleep hygiene measures including regular sleep schedule, optimal sleep environment, and relaxing presleep rituals.   - Refill traZODone (DESYREL) 50 MG tablet; Take 0.5-1 tablets (25-50 mg total) by mouth at bedtime as needed for sleep.  Dispense: 30 tablet; Refill: 3  5. Liver cirrhosis secondary to NASH (nonalcoholic steatohepatitis) (Katherine [name]) Will continue to follow along as it relates to her weight loss journey.  6. Hepatic encephalopathy (Katherine [name]) She is being followed by Hepatology.  She is currently taking lactulose 30 mL three times daily. I encouraged her to reach out to Hepatology for more direction.  Red flags reviewed.  7. Obesity with current BMI of 43.9  Course: Katherine [name] is currently in the action stage of change. As such, her goal is to continue with weight loss efforts.   Nutrition goals: She has agreed to keeping a food journal and adhering to recommended goals of  1200-1500 calories and 40 grams of protein.   Exercise goals:  As is.  Behavioral modification strategies: increasing lean protein intake, decreasing simple carbohydrates, and decreasing sodium intake.  Katherine [name] has agreed to follow-up with our clinic in 4 weeks. She was informed of the importance of frequent follow-up visits to maximize her success with intensive lifestyle modifications for her multiple health conditions.   Objective:   Blood pressure 121/75, pulse 84, temperature 98.3 F (36.8 C), temperature source Oral, height 5' 6"  (1.676 m), weight 272 lb (123.4 kg), SpO2 94 %. Body mass index is 43.9 kg/m.  General: Cooperative, alert, well developed, in no acute distress. HEENT: Conjunctivae and lids unremarkable. Cardiovascular: Regular rhythm.  Lungs: Normal work of breathing. Neurologic: No focal deficits.   Lab Results  Component Value Date   CREATININE 0.9 04/19/2020   BUN 26 (A) 04/19/2020   NA 142 04/19/2020   K 4.6 04/19/2020   CL 106 04/19/2020   CO2 31 (A) 04/19/2020   Lab Results  Component Value Date   ALT 25 04/19/2020   AST 40 (A) 04/19/2020   ALKPHOS 60 12/30/2019   BILITOT 0.8 06/18/2019   Lab Results  Component Value Date   HGBA1C 5.9 03/12/2020   HGBA1C 7.3 09/22/2019   HGBA1C 6.5 (H) 07/28/2018   HGBA1C 10.1 (H) 09/08/2012   HGBA1C 10.1 (H) 09/07/2012   Lab Results  Component Value Date   TSH 3.45 12/13/2019   Lab Results  Component Value Date   CHOL 95 12/13/2019   HDL 44 12/13/2019   LDLCALC 29 12/13/2019   TRIG 123 12/13/2019   CHOLHDL 2.8 08/19/2017   Lab Results  Component Value Date   WBC 4.8 06/18/2019   HGB 13.8 06/18/2019   HCT 43.8 06/18/2019   MCV 83.3 06/18/2019   PLT PLATELET CLUMPS NOTED ON SMEAR, UNABLE TO ESTIMATE 06/18/2019   Lab Results  Component Value Date   IRON 39 08/19/2017   TIBC 404 08/19/2017   Obesity Behavioral Intervention:   Approximately 15 minutes were spent on the discussion  below.  ASK: We discussed the diagnosis of obesity with Katherine [name] today and Katherine [name] agreed to give [name] permission to discuss obesity behavioral modification therapy today.  ASSESS: Katherine [name] has the diagnosis of obesity and her BMI today is 43.9. Zelie is in the action stage of change.   ADVISE: Damira was educated on the multiple health risks of obesity as well as the benefit of weight loss to improve her health. She was advised of the need for long term treatment and the importance of lifestyle modifications to improve her current health and to decrease her risk of future health problems.  AGREE: Multiple dietary modification options and treatment options were discussed and Aasia agreed to follow the recommendations documented in the above note.  ARRANGE: Aja was educated on the importance of frequent visits to treat obesity as outlined per CMS and USPSTF guidelines and agreed to schedule her next follow up appointment today.  Attestation Statements:   Reviewed by clinician on day of visit: allergies, medications, problem list, medical history, surgical history, family history, social history, and previous encounter notes.  I, Water quality scientist, CMA, am acting as Location manager  for Briscoe Deutscher, DO  I have reviewed the above documentation for accuracy and completeness, and I agree with the above. Briscoe Deutscher, DO

## 2020-11-10 ENCOUNTER — Other Ambulatory Visit (INDEPENDENT_AMBULATORY_CARE_PROVIDER_SITE_OTHER): Payer: Self-pay | Admitting: Family Medicine

## 2020-11-10 DIAGNOSIS — E038 Other specified hypothyroidism: Secondary | ICD-10-CM

## 2020-11-12 NOTE — Telephone Encounter (Signed)
LAST APPOINTMENT DATE: 11/06/20 NEXT APPOINTMENT DATE: 12/04/20   [id number] [place] [address] [place] [address]Phone: [phone]: [phone] [facility]  ([place] [place]Phone: [phone]: [phone], [place] - [address]  ([place] [address]Phone: [phone]: [phone] Patient is requesting a refill of the following medications: Pending Prescriptions:                       Disp   Refills   levothyroxine (SYNTHROID) 25 MCG tablet [P*30 tab*0       Sig: TAKE 1 TABLET BY MOUTH EVERY DAY BEFORE BREAKFAST   Date last filled: 09/04/20 Previously prescribed by [facility] [place] Lab Results      Component                Value               Date                      HGBA1C                   5.9                 03/12/2020                HGBA1C                   7.3                 09/22/2019                HGBA1C                   6.5 (H)             07/28/2018           Lab Results      Component                Value               Date                      MICROALBUR               2.86                04/19/2020                LDLCALC                  29                  12/13/2019                CREATININE               0.9                 04/19/2020           No results found for: VD25OH  BP Readings from Last 3 Encounters: 11/06/20 : 121/75 10/09/20 : 115/74 09/21/20 : 116/78

## 2020-11-12 NOTE — Telephone Encounter (Signed)
Last OV with Dr Wallace 

## 2020-11-15 ENCOUNTER — Encounter: Payer: Self-pay | Admitting: Registered Nurse

## 2020-11-15 ENCOUNTER — Other Ambulatory Visit: Payer: Self-pay

## 2020-11-15 ENCOUNTER — Encounter: Payer: Medicare Other | Attending: Registered Nurse | Admitting: Registered Nurse

## 2020-11-15 VITALS — BP 134/86 | HR 87 | Temp 98.2°F | Ht 66.0 in | Wt 276.8 lb

## 2020-11-15 DIAGNOSIS — G8929 Other chronic pain: Secondary | ICD-10-CM

## 2020-11-15 DIAGNOSIS — R202 Paresthesia of skin: Secondary | ICD-10-CM | POA: Diagnosis not present

## 2020-11-15 DIAGNOSIS — Z5181 Encounter for therapeutic drug level monitoring: Secondary | ICD-10-CM

## 2020-11-15 DIAGNOSIS — Z79891 Long term (current) use of opiate analgesic: Secondary | ICD-10-CM | POA: Diagnosis not present

## 2020-11-15 DIAGNOSIS — M47816 Spondylosis without myelopathy or radiculopathy, lumbar region: Secondary | ICD-10-CM | POA: Diagnosis not present

## 2020-11-15 DIAGNOSIS — G894 Chronic pain syndrome: Secondary | ICD-10-CM | POA: Diagnosis not present

## 2020-11-15 DIAGNOSIS — M25511 Pain in right shoulder: Secondary | ICD-10-CM

## 2020-11-15 DIAGNOSIS — S8412XS Injury of peroneal nerve at lower leg level, left leg, sequela: Secondary | ICD-10-CM

## 2020-11-15 DIAGNOSIS — M1711 Unilateral primary osteoarthritis, right knee: Secondary | ICD-10-CM | POA: Diagnosis not present

## 2020-11-15 MED ORDER — OXYCODONE HCL 10 MG PO TABS: 10.0000 mg | ORAL_TABLET | Freq: Three times a day (TID) | ORAL | 0 refills | Status: DC | PRN
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
## 2020-11-16 DIAGNOSIS — M25511 Pain in right shoulder: Secondary | ICD-10-CM | POA: Diagnosis not present

## 2020-11-23 DIAGNOSIS — Z20822 Contact with and (suspected) exposure to covid-19: Secondary | ICD-10-CM | POA: Diagnosis not present

## 2020-11-27 ENCOUNTER — Telehealth: Payer: Self-pay

## 2020-11-27 ENCOUNTER — Other Ambulatory Visit (HOSPITAL_COMMUNITY): Payer: Self-pay

## 2020-11-27 DIAGNOSIS — U071 COVID-19: Secondary | ICD-10-CM

## 2020-11-27 NOTE — Telephone Encounter (Signed)
Called to schedule bebtelovimab covid infusion no answer. Left a message to call back.

## 2020-11-28 ENCOUNTER — Ambulatory Visit (INDEPENDENT_AMBULATORY_CARE_PROVIDER_SITE_OTHER): Payer: Medicare Other

## 2020-11-28 DIAGNOSIS — U071 COVID-19: Secondary | ICD-10-CM | POA: Diagnosis not present

## 2020-11-28 MED ORDER — ALBUTEROL SULFATE HFA 108 (90 BASE) MCG/ACT IN AERS: 2.0000 | INHALATION_SPRAY | Freq: Once | RESPIRATORY_TRACT | Status: AC | PRN

## 2020-11-28 MED ORDER — SODIUM CHLORIDE 0.9 % IV SOLN: INTRAVENOUS | Status: DC | PRN

## 2020-11-28 MED ORDER — DIPHENHYDRAMINE HCL 50 MG/ML IJ SOLN: 50.0000 mg | Freq: Once | INTRAMUSCULAR | Status: AC | PRN

## 2020-11-28 MED ORDER — FAMOTIDINE IN NACL 20-0.9 MG/50ML-% IV SOLN: 20.0000 mg | Freq: Once | INTRAVENOUS | Status: AC | PRN

## 2020-11-28 MED ORDER — BEBTELOVIMAB 175 MG/2 ML IV (EUA)
175.0000 mg | Freq: Once | INTRAMUSCULAR | Status: AC
  Administered 2020-11-28: 175 mg via INTRAVENOUS

## 2020-11-28 MED ORDER — EPINEPHRINE 0.3 MG/0.3ML IJ SOAJ: 0.3000 mg | Freq: Once | INTRAMUSCULAR | Status: AC | PRN

## 2020-11-28 MED ORDER — METHYLPREDNISOLONE SODIUM SUCC 125 MG IJ SOLR: 125.0000 mg | Freq: Once | INTRAMUSCULAR | Status: AC | PRN

## 2020-11-28 NOTE — Patient Instructions (Signed)
Patient was given drug information sheet for Bebtelovimab.  Also given cost estimate sheet for Bebtelovimab.  Patient reviewed documentation and questions were answered.  Patient would like to proceed with treatment at this time.   

## 2020-11-28 NOTE — Progress Notes (Signed)
Diagnosis: COVID  Provider:  Marshell Garfinkel, MD  Procedure: Infusion  IV Type: Peripheral, IV Location: R Hand  Bebtelovimab, Dose: 175 mg  Infusion Start Time: 9068  Infusion Stop Time: 1335  Post Infusion IV Care: Observation period completed and Peripheral IV Discontinued  Discharge: Condition: Good, Destination: Home . AVS provided to patient.   Performed by:  Sandeep Radell, Sherlon Handing, LPN

## 2020-11-29 DIAGNOSIS — Z20822 Contact with and (suspected) exposure to covid-19: Secondary | ICD-10-CM | POA: Diagnosis not present

## 2020-12-04 ENCOUNTER — Other Ambulatory Visit: Payer: Self-pay

## 2020-12-04 ENCOUNTER — Encounter (INDEPENDENT_AMBULATORY_CARE_PROVIDER_SITE_OTHER): Payer: Self-pay | Admitting: Family Medicine

## 2020-12-04 ENCOUNTER — Ambulatory Visit (INDEPENDENT_AMBULATORY_CARE_PROVIDER_SITE_OTHER): Payer: Medicare Other | Admitting: Family Medicine

## 2020-12-04 VITALS — BP 128/79 | HR 83 | Temp 98.2°F | Ht 66.0 in | Wt 274.0 lb

## 2020-12-04 DIAGNOSIS — F5101 Primary insomnia: Secondary | ICD-10-CM | POA: Diagnosis not present

## 2020-12-04 DIAGNOSIS — Z794 Long term (current) use of insulin: Secondary | ICD-10-CM | POA: Diagnosis not present

## 2020-12-04 DIAGNOSIS — E1169 Type 2 diabetes mellitus with other specified complication: Secondary | ICD-10-CM | POA: Diagnosis not present

## 2020-12-04 DIAGNOSIS — E1159 Type 2 diabetes mellitus with other circulatory complications: Secondary | ICD-10-CM | POA: Diagnosis not present

## 2020-12-04 DIAGNOSIS — K7581 Nonalcoholic steatohepatitis (NASH): Secondary | ICD-10-CM | POA: Diagnosis not present

## 2020-12-04 DIAGNOSIS — K5909 Other constipation: Secondary | ICD-10-CM

## 2020-12-04 DIAGNOSIS — Z6841 Body Mass Index (BMI) 40.0 and over, adult: Secondary | ICD-10-CM

## 2020-12-04 DIAGNOSIS — I152 Hypertension secondary to endocrine disorders: Secondary | ICD-10-CM

## 2020-12-04 DIAGNOSIS — K7460 Unspecified cirrhosis of liver: Secondary | ICD-10-CM

## 2020-12-04 MED ORDER — OZEMPIC (1 MG/DOSE) 4 MG/3ML ~~LOC~~ SOPN: 1.0000 mg | PEN_INJECTOR | SUBCUTANEOUS | 0 refills | Status: DC

## 2020-12-04 MED ORDER — LINACLOTIDE 145 MCG PO CAPS: 145.0000 ug | ORAL_CAPSULE | Freq: Every day | ORAL | 0 refills | Status: DC

## 2020-12-04 MED ORDER — HYDROCHLOROTHIAZIDE 12.5 MG PO CAPS: 12.5000 mg | ORAL_CAPSULE | Freq: Every day | ORAL | 0 refills | Status: DC

## 2020-12-04 MED ORDER — TRAZODONE HCL 50 MG PO TABS: 25.0000 mg | ORAL_TABLET | Freq: Every evening | ORAL | 3 refills | Status: DC | PRN

## 2020-12-05 LAB — CBC WITH DIFFERENTIAL/PLATELET
Basophils Absolute: 0.1 10*3/uL (ref 0.0–0.2)
Basos: 1 %
EOS (ABSOLUTE): 0.2 10*3/uL (ref 0.0–0.4)
Eos: 4 %
Hematocrit: 44.7 % (ref 34.0–46.6)
Hemoglobin: 14.3 g/dL (ref 11.1–15.9)
Immature Grans (Abs): 0 10*3/uL (ref 0.0–0.1)
Immature Granulocytes: 0 %
Lymphocytes Absolute: 1.2 10*3/uL (ref 0.7–3.1)
Lymphs: 22 %
MCH: 26.4 pg — ABNORMAL LOW (ref 26.6–33.0)
MCHC: 32 g/dL (ref 31.5–35.7)
MCV: 83 fL (ref 79–97)
Monocytes Absolute: 0.6 10*3/uL (ref 0.1–0.9)
Monocytes: 12 %
Neutrophils Absolute: 3.4 10*3/uL (ref 1.4–7.0)
Neutrophils: 61 %
Platelets: 131 10*3/uL — ABNORMAL LOW (ref 150–450)
RBC: 5.42 x10E6/uL — ABNORMAL HIGH (ref 3.77–5.28)
RDW: 13 % (ref 11.7–15.4)
WBC: 5.5 10*3/uL (ref 3.4–10.8)

## 2020-12-05 LAB — COMPREHENSIVE METABOLIC PANEL
ALT: 29 IU/L (ref 0–32)
AST: 43 IU/L — ABNORMAL HIGH (ref 0–40)
Albumin/Globulin Ratio: 1.1 — ABNORMAL LOW (ref 1.2–2.2)
Albumin: 3.8 g/dL (ref 3.8–4.8)
Alkaline Phosphatase: 83 IU/L (ref 44–121)
BUN/Creatinine Ratio: 30 — ABNORMAL HIGH (ref 12–28)
BUN: 26 mg/dL (ref 8–27)
Bilirubin Total: 0.6 mg/dL (ref 0.0–1.2)
CO2: 23 mmol/L (ref 20–29)
Calcium: 9.8 mg/dL (ref 8.7–10.3)
Chloride: 103 mmol/L (ref 96–106)
Creatinine, Ser: 0.86 mg/dL (ref 0.57–1.00)
Globulin, Total: 3.6 g/dL (ref 1.5–4.5)
Glucose: 148 mg/dL — ABNORMAL HIGH (ref 70–99)
Potassium: 4.5 mmol/L (ref 3.5–5.2)
Sodium: 144 mmol/L (ref 134–144)
Total Protein: 7.4 g/dL (ref 6.0–8.5)
eGFR: 75 mL/min/{1.73_m2} (ref >59)

## 2020-12-05 LAB — AMMONIA: Ammonia: 111 ug/dL (ref 34–178)

## 2020-12-11 NOTE — Progress Notes (Signed)
Chief Complaint:   OBESITY Katherine [name] is here to discuss her progress with her obesity treatment plan along with follow-up of her obesity related diagnoses.   Today's visit was #: 27 Starting weight: 294 lbs Starting date: 10/13/2019 Today's weight: 274 lbs Today's date: 12/04/2020 Weight change since last visit: 20 lbs Total lbs lost to date: +2 lbs Body mass index is 44.22 kg/m.  Total weight loss percentage to date: -6.80%  Current Meal Plan: keeping a food journal and adhering to recommended goals of 1200-1500 calories and 40 grams of protein for 20% of the time.  Current Exercise Plan: None. Current Anti-Obesity Medications: Ozempic 1 mg subcutaneously twice weekly. Side effects: None.  Interim History:  Katherine [name] has had constipation for 1 week (no BM).  She says she has not been taking Lactulose, prescribed by Hepatology.  Assessment/Plan:   1. Chronic constipation This problem is poorly controlled.   Plan:  Continue Linzess 145 mcg daily.  Reviewed bowel regimen today. Will contact GI to go over lactulose with her again.  - Refill linaclotide (LINZESS) 145 MCG CAPS capsule; Take 1 capsule (145 mcg total) by mouth daily before breakfast.  Dispense: 90 capsule; Refill: 0  2. Primary insomnia This is well controlled.  Current treatment: trazodone 25-50 mg as bedtime as needed for sleep.  Plan: Recommend sleep hygiene measures including regular sleep schedule, optimal sleep environment, and relaxing presleep rituals.   Hold trazodone as long as she is having increased confusion.  - Refill traZODone (DESYREL) 50 MG tablet; Take 0.5-1 tablets (25-50 mg total) by mouth at bedtime as needed for sleep.  Dispense: 30 tablet; Refill: 3  3. Hypertension associated with type 2 diabetes mellitus (Katherine [name]) At goal. Medications: HCTZ 53.2 mg daily, Bystolic 10 mg daily.  Consider Lasix/Spironolactone.  Plan: Avoid buying foods that are: processed, frozen, or prepackaged to avoid  excess salt. We will watch for signs of hypotension as she continues lifestyle modifications.  BP Readings from Last 3 Encounters:  12/04/20 128/79  11/28/20 105/73  11/15/20 134/86   Lab Results  Component Value Date   CREATININE 0.86 12/04/2020   - Refill hydrochlorothiazide (MICROZIDE) 12.5 MG capsule; Take 1 capsule (12.5 mg total) by mouth daily.  Dispense: 30 capsule; Refill: 0  4. IDDM, with recent hyperglycemia, corrected by increasing insulin from 10 units to 50 units Diabetes Mellitus: Not optimized. Medication: [name] 50 units daily, Ozempic 1 mg subcutaneously weekly. Issues reviewed: blood sugar goals, complications of diabetes mellitus, hypoglycemia prevention and treatment, exercise, and nutrition.   Plan:  Continue Ozempic.  Will refill today.  The patient will continue to focus on protein-rich, low simple carbohydrate foods. We reviewed the importance of hydration, regular exercise for stress reduction, and restorative sleep.   Lab Results  Component Value Date   HGBA1C 5.9 03/12/2020   HGBA1C 7.3 09/22/2019   HGBA1C 6.5 (H) 07/28/2018   Lab Results  Component Value Date   MICROALBUR 2.86 04/19/2020   LDLCALC 29 12/13/2019   CREATININE 0.86 12/04/2020   - Refill Semaglutide, 1 MG/DOSE, (OZEMPIC, 1 MG/DOSE,) 4 MG/3ML SOPN; Inject 1 mg into the skin 2 (two) times a week.  Dispense: 6 mL; Refill: 0  5. Liver cirrhosis secondary to NASH (nonalcoholic steatohepatitis) ([name]) With worsening mentation. Spoke with Hepatology and greatly appreciate the help. Will let PCP know.  Plan:  Check labs today.  - CBC with Differential/Platelet - Comprehensive metabolic panel - [place]  6. Obesity with current BMI of 44.4  Course: Katherine [name] is currently in the action stage of change. As such, her goal is to continue with weight loss efforts.   Nutrition goals: She has agreed to keeping a food journal and adhering to recommended goals of 1200-1500 calories and 40 grams of  protein.   Exercise goals: No exercise has been prescribed at this time.  Behavioral modification strategies: increasing water intake.  Katherine [name] has agreed to follow-up with our clinic in 2 weeks. She was informed of the importance of frequent follow-up visits to maximize her success with intensive lifestyle modifications for her multiple health conditions.   Katherine [name] was informed we would discuss her lab results at her next visit unless there is a critical issue that needs to be addressed sooner. Katherine [name] agreed to keep her next visit at the agreed upon time to discuss these results.  Objective:   Blood pressure 128/79, pulse 83, temperature 98.2 F (36.8 C), temperature source Oral, height 5' 6"  (1.676 m), weight 274 lb (124.3 kg), SpO2 93 %. Body mass index is 44.22 kg/m.  General: Cooperative, alert, well developed, in no acute distress. HEENT: Conjunctivae and lids unremarkable. Cardiovascular: Regular rhythm.  Lungs: Normal work of breathing. Neurologic: No focal deficits.   Lab Results  Component Value Date   CREATININE 0.86 12/04/2020   BUN 26 12/04/2020   NA 144 12/04/2020   K 4.5 12/04/2020   CL 103 12/04/2020   CO2 23 12/04/2020   Lab Results  Component Value Date   ALT 29 12/04/2020   AST 43 (H) 12/04/2020   ALKPHOS 83 12/04/2020   BILITOT 0.6 12/04/2020   Lab Results  Component Value Date   HGBA1C 5.9 03/12/2020   HGBA1C 7.3 09/22/2019   HGBA1C 6.5 (H) 07/28/2018   HGBA1C 10.1 (H) 09/08/2012   HGBA1C 10.1 (H) 09/07/2012   Lab Results  Component Value Date   TSH 3.45 12/13/2019   Lab Results  Component Value Date   CHOL 95 12/13/2019   HDL 44 12/13/2019   LDLCALC 29 12/13/2019   TRIG 123 12/13/2019   CHOLHDL 2.8 08/19/2017   Lab Results  Component Value Date   WBC 5.5 12/04/2020   HGB 14.3 12/04/2020   HCT 44.7 12/04/2020   MCV 83 12/04/2020   PLT 131 (L) 12/04/2020   Lab Results  Component Value Date   IRON 39 08/19/2017   TIBC 404  08/19/2017   Attestation Statements:   Reviewed by clinician on day of visit: allergies, medications, problem list, medical history, surgical history, family history, social history, and previous encounter notes.  Time spent on visit including pre-visit chart review and post-visit care and charting was 48 minutes.   I, Water quality scientist, CMA, am acting as transcriptionist for Briscoe Deutscher, DO  I have reviewed the above documentation for accuracy and completeness, and I agree with the above. -  Briscoe Deutscher, DO, MS, FAAFP, DABOM - Family and Bariatric Medicine.

## 2020-12-17 ENCOUNTER — Emergency Department (HOSPITAL_BASED_OUTPATIENT_CLINIC_OR_DEPARTMENT_OTHER)
Admission: EM | Admit: 2020-12-17 | Discharge: 2020-12-17 | Disposition: A | Discharge status: Home / Self Care | Payer: Medicare Other | Attending: Emergency Medicine | Admitting: Emergency Medicine

## 2020-12-17 ENCOUNTER — Emergency Department (HOSPITAL_BASED_OUTPATIENT_CLINIC_OR_DEPARTMENT_OTHER): Payer: Medicare Other

## 2020-12-17 ENCOUNTER — Encounter (HOSPITAL_BASED_OUTPATIENT_CLINIC_OR_DEPARTMENT_OTHER): Payer: Self-pay | Admitting: Emergency Medicine

## 2020-12-17 ENCOUNTER — Other Ambulatory Visit: Payer: Self-pay

## 2020-12-17 DIAGNOSIS — E876 Hypokalemia: Secondary | ICD-10-CM | POA: Diagnosis not present

## 2020-12-17 DIAGNOSIS — K219 Gastro-esophageal reflux disease without esophagitis: Secondary | ICD-10-CM | POA: Diagnosis not present

## 2020-12-17 DIAGNOSIS — E1122 Type 2 diabetes mellitus with diabetic chronic kidney disease: Secondary | ICD-10-CM | POA: Diagnosis not present

## 2020-12-17 DIAGNOSIS — I129 Hypertensive chronic kidney disease with stage 1 through stage 4 chronic kidney disease, or unspecified chronic kidney disease: Secondary | ICD-10-CM | POA: Insufficient documentation

## 2020-12-17 DIAGNOSIS — Z794 Long term (current) use of insulin: Secondary | ICD-10-CM | POA: Diagnosis not present

## 2020-12-17 DIAGNOSIS — K5792 Diverticulitis of intestine, part unspecified, without perforation or abscess without bleeding: Secondary | ICD-10-CM

## 2020-12-17 DIAGNOSIS — R1084 Generalized abdominal pain: Secondary | ICD-10-CM

## 2020-12-17 DIAGNOSIS — K5732 Diverticulitis of large intestine without perforation or abscess without bleeding: Secondary | ICD-10-CM | POA: Diagnosis not present

## 2020-12-17 DIAGNOSIS — N183 Chronic kidney disease, stage 3 unspecified: Secondary | ICD-10-CM | POA: Diagnosis not present

## 2020-12-17 DIAGNOSIS — E039 Hypothyroidism, unspecified: Secondary | ICD-10-CM | POA: Insufficient documentation

## 2020-12-17 DIAGNOSIS — Z7982 Long term (current) use of aspirin: Secondary | ICD-10-CM | POA: Diagnosis not present

## 2020-12-17 DIAGNOSIS — R1032 Left lower quadrant pain: Secondary | ICD-10-CM | POA: Diagnosis not present

## 2020-12-17 DIAGNOSIS — R109 Unspecified abdominal pain: Secondary | ICD-10-CM | POA: Diagnosis not present

## 2020-12-17 DIAGNOSIS — Z79899 Other long term (current) drug therapy: Secondary | ICD-10-CM | POA: Diagnosis not present

## 2020-12-17 DIAGNOSIS — Z87891 Personal history of nicotine dependence: Secondary | ICD-10-CM | POA: Insufficient documentation

## 2020-12-17 LAB — COMPREHENSIVE METABOLIC PANEL
ALT: 36 U/L (ref 0–44)
AST: 49 U/L — ABNORMAL HIGH (ref 15–41)
Albumin: 3.5 g/dL (ref 3.5–5.0)
Alkaline Phosphatase: 80 U/L (ref 38–126)
Anion gap: 10 (ref 5–15)
BUN: 34 mg/dL — ABNORMAL HIGH (ref 8–23)
CO2: 23 mmol/L (ref 22–32)
Calcium: 9.8 mg/dL (ref 8.9–10.3)
Chloride: 100 mmol/L (ref 98–111)
Creatinine, Ser: 1.36 mg/dL — ABNORMAL HIGH (ref 0.44–1.00)
GFR, Estimated: 43 mL/min — ABNORMAL LOW (ref >60)
Glucose, Bld: 120 mg/dL — ABNORMAL HIGH (ref 70–99)
Potassium: 2.9 mmol/L — ABNORMAL LOW (ref 3.5–5.1)
Sodium: 133 mmol/L — ABNORMAL LOW (ref 135–145)
Total Bilirubin: 0.7 mg/dL (ref 0.3–1.2)
Total Protein: 7.8 g/dL (ref 6.5–8.1)

## 2020-12-17 LAB — CBC WITH DIFFERENTIAL/PLATELET
Abs Immature Granulocytes: 0.04 10*3/uL (ref 0.00–0.07)
Basophils Absolute: 0.1 10*3/uL (ref 0.0–0.1)
Basophils Relative: 1 %
Eosinophils Absolute: 0.5 10*3/uL (ref 0.0–0.5)
Eosinophils Relative: 5 %
HCT: 45.6 % (ref 36.0–46.0)
Hemoglobin: 15.5 g/dL — ABNORMAL HIGH (ref 12.0–15.0)
Immature Granulocytes: 0 %
Lymphocytes Relative: 18 %
Lymphs Abs: 1.9 10*3/uL (ref 0.7–4.0)
MCH: 26.7 pg (ref 26.0–34.0)
MCHC: 34 g/dL (ref 30.0–36.0)
MCV: 78.5 fL — ABNORMAL LOW (ref 80.0–100.0)
Monocytes Absolute: 1.2 10*3/uL — ABNORMAL HIGH (ref 0.1–1.0)
Monocytes Relative: 11 %
Neutro Abs: 7 10*3/uL (ref 1.7–7.7)
Neutrophils Relative %: 65 %
Platelets: 171 10*3/uL (ref 150–400)
RBC: 5.81 MIL/uL — ABNORMAL HIGH (ref 3.87–5.11)
RDW: 13.2 % (ref 11.5–15.5)
WBC: 10.7 10*3/uL — ABNORMAL HIGH (ref 4.0–10.5)
nRBC: 0 % (ref 0.0–0.2)

## 2020-12-17 LAB — LIPASE, BLOOD: Lipase: 34 U/L (ref 11–51)

## 2020-12-17 MED ORDER — SODIUM CHLORIDE 0.9 % IV BOLUS
500.0000 mL | Freq: Once | INTRAVENOUS | Status: AC
  Administered 2020-12-17: 500 mL via INTRAVENOUS

## 2020-12-17 MED ORDER — POTASSIUM CHLORIDE ER 10 MEQ PO TBCR: 10.0000 meq | EXTENDED_RELEASE_TABLET | Freq: Every day | ORAL | 0 refills | Status: DC

## 2020-12-17 MED ORDER — AMOXICILLIN-POT CLAVULANATE 875-125 MG PO TABS: 1.0000 | ORAL_TABLET | Freq: Two times a day (BID) | ORAL | 0 refills | Status: AC

## 2020-12-17 MED ORDER — POTASSIUM CHLORIDE CRYS ER 20 MEQ PO TBCR
40.0000 meq | EXTENDED_RELEASE_TABLET | Freq: Once | ORAL | Status: AC
  Administered 2020-12-17: 40 meq via ORAL
  Filled 2020-12-17: qty 2

## 2020-12-17 MED ORDER — AMOXICILLIN-POT CLAVULANATE 875-125 MG PO TABS
1.0000 | ORAL_TABLET | Freq: Once | ORAL | Status: AC
  Administered 2020-12-17: 1 via ORAL
  Filled 2020-12-17: qty 1

## 2020-12-17 MED ORDER — OXYCODONE-ACETAMINOPHEN 5-325 MG PO TABS
1.0000 | ORAL_TABLET | Freq: Once | ORAL | Status: AC
  Administered 2020-12-17: 1 via ORAL
  Filled 2020-12-17: qty 1

## 2020-12-17 MED ORDER — IOHEXOL 300 MG/ML  SOLN
80.0000 mL | Freq: Once | INTRAMUSCULAR | Status: AC | PRN
  Administered 2020-12-17: 80 mL via INTRAVENOUS

## 2020-12-17 MED ORDER — HYDROMORPHONE HCL 1 MG/ML IJ SOLN
0.5000 mg | Freq: Once | INTRAMUSCULAR | Status: AC
  Administered 2020-12-17: 0.5 mg via INTRAVENOUS
  Filled 2020-12-17: qty 1

## 2020-12-17 MED ORDER — ONDANSETRON HCL 4 MG/2ML IJ SOLN
4.0000 mg | Freq: Once | INTRAMUSCULAR | Status: AC
  Administered 2020-12-17: 4 mg via INTRAVENOUS
  Filled 2020-12-17: qty 2
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
## 2020-12-17 NOTE — ED Triage Notes (Signed)
Pt arrives pov with c/o LLQ and left flank pain x 2 weeks, lower back pain x 2 days. Pt reports provider referred to ED to rule out bowel obstruction. Last bm x 2 weeks prior, reports passing water only. Endorses oxycodone at 1400 today with no relief

## 2020-12-17 NOTE — Discharge Instructions (Signed)
Take Augmentin as prescribed and complete the full course. Take potassium supplement daily for the next 5 days, your doctor can recheck your labs and continue this if needed. Take your oxycodone as prescribed for pain. Follow up with your GI doctor regarding your diverticulitis.  Return to the ER for new or worsening symptoms.

## 2020-12-17 NOTE — ED Notes (Signed)
Patient transported to CT 

## 2020-12-21 DIAGNOSIS — R195 Other fecal abnormalities: Secondary | ICD-10-CM | POA: Diagnosis not present

## 2020-12-21 DIAGNOSIS — K5792 Diverticulitis of intestine, part unspecified, without perforation or abscess without bleeding: Secondary | ICD-10-CM | POA: Diagnosis not present

## 2020-12-21 DIAGNOSIS — E876 Hypokalemia: Secondary | ICD-10-CM | POA: Diagnosis not present

## 2020-12-25 ENCOUNTER — Encounter (INDEPENDENT_AMBULATORY_CARE_PROVIDER_SITE_OTHER): Payer: Self-pay | Admitting: Family Medicine

## 2020-12-25 ENCOUNTER — Ambulatory Visit (INDEPENDENT_AMBULATORY_CARE_PROVIDER_SITE_OTHER): Payer: Medicare Other | Admitting: Family Medicine

## 2020-12-25 ENCOUNTER — Other Ambulatory Visit: Payer: Self-pay

## 2020-12-25 VITALS — BP 87/59 | HR 88 | Temp 97.5°F | Ht 66.0 in | Wt 270.0 lb

## 2020-12-25 DIAGNOSIS — K5792 Diverticulitis of intestine, part unspecified, without perforation or abscess without bleeding: Secondary | ICD-10-CM

## 2020-12-25 DIAGNOSIS — F5101 Primary insomnia: Secondary | ICD-10-CM | POA: Diagnosis not present

## 2020-12-25 DIAGNOSIS — Z794 Long term (current) use of insulin: Secondary | ICD-10-CM

## 2020-12-25 DIAGNOSIS — I9589 Other hypotension: Secondary | ICD-10-CM

## 2020-12-25 DIAGNOSIS — K7460 Unspecified cirrhosis of liver: Secondary | ICD-10-CM | POA: Diagnosis not present

## 2020-12-25 DIAGNOSIS — Z79899 Other long term (current) drug therapy: Secondary | ICD-10-CM | POA: Diagnosis not present

## 2020-12-25 DIAGNOSIS — K7581 Nonalcoholic steatohepatitis (NASH): Secondary | ICD-10-CM

## 2020-12-25 DIAGNOSIS — E1169 Type 2 diabetes mellitus with other specified complication: Secondary | ICD-10-CM | POA: Diagnosis not present

## 2020-12-25 DIAGNOSIS — Z6841 Body Mass Index (BMI) 40.0 and over, adult: Secondary | ICD-10-CM

## 2020-12-25 MED ORDER — OZEMPIC (1 MG/DOSE) 4 MG/3ML ~~LOC~~ SOPN: 1.0000 mg | PEN_INJECTOR | SUBCUTANEOUS | 0 refills | Status: DC

## 2020-12-26 ENCOUNTER — Encounter (INDEPENDENT_AMBULATORY_CARE_PROVIDER_SITE_OTHER): Payer: Self-pay | Admitting: Family Medicine

## 2020-12-26 LAB — BASIC METABOLIC PANEL
BUN/Creatinine Ratio: 16 (ref 12–28)
BUN: 24 mg/dL (ref 8–27)
CO2: 30 mmol/L — ABNORMAL HIGH (ref 20–29)
Calcium: 9.5 mg/dL (ref 8.7–10.3)
Chloride: 94 mmol/L — ABNORMAL LOW (ref 96–106)
Creatinine, Ser: 1.48 mg/dL — ABNORMAL HIGH (ref 0.57–1.00)
Glucose: 169 mg/dL — ABNORMAL HIGH (ref 70–99)
Potassium: 3.3 mmol/L — ABNORMAL LOW (ref 3.5–5.2)
Sodium: 137 mmol/L (ref 134–144)
eGFR: 39 mL/min/{1.73_m2} — ABNORMAL LOW (ref >59)

## 2020-12-26 LAB — MAGNESIUM: Magnesium: 2.5 mg/dL — ABNORMAL HIGH (ref 1.6–2.3)

## 2020-12-26 LAB — AMMONIA: Ammonia: 65 ug/dL (ref 34–178)

## 2020-12-26 MED ORDER — POTASSIUM CHLORIDE CRYS ER 10 MEQ PO TBCR: 10.0000 meq | EXTENDED_RELEASE_TABLET | Freq: Two times a day (BID) | ORAL | 0 refills | Status: DC

## 2020-12-26 NOTE — Progress Notes (Signed)
Chief Complaint:   OBESITY Katherine [name] is here to discuss her progress with her obesity treatment plan along with follow-up of her obesity related diagnoses. See Medical Weight Management Flowsheet for complete bioelectrical impedance results.  Today's visit was #: 25 Starting weight: 293 lbs Starting date: 10/13/2019 Weight change since last visit: 4 lbs Total lbs lost to date: 23 lbs Total weight loss percentage to date: -7.85%  Nutrition Plan: Keeping a food journal and adhering to recommended goals of 1200-1500 calories and 40 grams of protein daily for 20% of the time. Activity: None at this time. Anti-obesity medications: Ozempic 1 mg subcutaneously weekly. Reported side effects: None.  Interim History: After our last visit, I spoke with Hepatology. I greatly appreciate the help - Katherine [name] was called and ultimately told to go to the ED. She was diagnosed with diverticulitis and low potassium.  She was prescribed Augmentin x 10 days and potassium x 5 days.  She restarted Lactulose for hepatic encephalopathy. She followed up after with [facility] MD. She brought that note in so reviewed together - Vitals WNL.   She says her abdominal pain has improved.  She has finished the potassium.  She has 3 days left of antibiotics.  Her blood sugars are more stable now, she reports. Unfortunately, she appears very fatigued and puny today. BP 87/59. She reports that she is struggling to maintain hydration. She is no longer driving due to this.  Assessment/Plan:   1. IDDM Diabetes Mellitus: More controlled. Medication: Ozempic 1 mg subcutaneously weekly, [name]. More controlled.  Will clarify current insulin requirements.  Recent hyperglycemia, likely from diverticulitis.  Plan: Continue Ozemipc 1 mg subcutaneously weekly.  Note: Will consider decreasing if BS controlled and she is still unable to maintain hydration and calories.   Lab Results  Component Value Date   HGBA1C 5.9 03/12/2020    HGBA1C 7.3 09/22/2019   HGBA1C 6.5 (H) 07/28/2018   Lab Results  Component Value Date   MICROALBUR 2.86 04/19/2020   LDLCALC 29 12/13/2019   CREATININE 1.36 (H) 12/17/2020   - Refill Semaglutide, 1 MG/DOSE, (OZEMPIC, 1 MG/DOSE,) 4 MG/3ML SOPN; Inject 1 mg into the skin 2 (two) times a week.  Dispense: 6 mL; Refill: 0  2. Other specified hypotension Her usual medications include HCTZ for BP/edema control.  She will monitor her BP regularly. RED flags reviewed.  Plan:  Increase hydration with electrolytes, hold HCTZ this week.  She will call if her edema worsens.  3. Diverticulitis Reviewed ED visit from last week. She is on day 7/10 of Augmentin. Her pain has improved.  4. Liver cirrhosis secondary to NASH (nonalcoholic steatohepatitis) (Katherine [name]) Unfortunately, not tolerating Lactulose, likely worsened diarrhea secondary to antibiotic use as well.  Concern for dehydration today.  Plan:  Will ask Hepatology to evaluate her this week if possible. Will discuss with patient's daughter in law and son, per patient's request.   - Magnesium - Basic Metabolic Panel (BMET) - NOTE: Ammonia ordered previously and added to this draw by mistake - will not plan to check ammonia levels going forward.   5. Medication management If blood sugar is controlled, we should consider decreasing GLP-1RA.  Her HCTZ is being held.  Trazodone is being held.  6. Primary insomnia This is well controlled.    Plan:  Holding Trazodone at this time.  7. Obesity with current BMI of 43.7  Course: Katherine [name] is currently in the action stage of change. As such, her goal is to continue  with weight loss efforts.   Nutrition goals: She has agreed to keeping a food journal and adhering to recommended goals of 1200-1500 calories and 40 grams of protein.   Exercise goals: No exercise has been prescribed at this time.  Behavioral modification strategies: increasing lean protein intake and increasing water  intake.  Katherine [name] has agreed to follow-up with our clinic in 2 weeks. She was informed of the importance of frequent follow-up visits to maximize her success with intensive lifestyle modifications for her multiple health conditions.   Objective:   Blood pressure (!) 87/59, pulse 88, temperature (!) 97.5 F (36.4 C), temperature source Oral, height 5' 6"  (1.676 m), weight 270 lb (122.5 kg), SpO2 90 %. Body mass index is 43.58 kg/m.  General: Cooperative, alert, well developed, in no acute distress. HEENT: Conjunctivae and lids unremarkable. Cardiovascular: Regular rhythm.  Lungs: Normal work of breathing. Neurologic: No focal deficits.   Lab Results  Component Value Date   CREATININE 1.36 (H) 12/17/2020   BUN 34 (H) 12/17/2020   NA 133 (L) 12/17/2020   K 2.9 (L) 12/17/2020   CL 100 12/17/2020   CO2 23 12/17/2020   Lab Results  Component Value Date   ALT 36 12/17/2020   AST 49 (H) 12/17/2020   ALKPHOS 80 12/17/2020   BILITOT 0.7 12/17/2020   Lab Results  Component Value Date   HGBA1C 5.9 03/12/2020   HGBA1C 7.3 09/22/2019   HGBA1C 6.5 (H) 07/28/2018   HGBA1C 10.1 (H) 09/08/2012   HGBA1C 10.1 (H) 09/07/2012   Lab Results  Component Value Date   TSH 3.45 12/13/2019   Lab Results  Component Value Date   CHOL 95 12/13/2019   HDL 44 12/13/2019   LDLCALC 29 12/13/2019   TRIG 123 12/13/2019   CHOLHDL 2.8 08/19/2017   Lab Results  Component Value Date   WBC 10.7 (H) 12/17/2020   HGB 15.5 (H) 12/17/2020   HCT 45.6 12/17/2020   MCV 78.5 (L) 12/17/2020   PLT 171 12/17/2020   Lab Results  Component Value Date   IRON 39 08/19/2017   TIBC 404 08/19/2017   Attestation Statements:   Reviewed by clinician on day of visit: allergies, medications, problem list, medical history, surgical history, family history, social history, and previous encounter notes.  I, Water quality scientist, CMA, am acting as transcriptionist for Briscoe Deutscher, DO  I have reviewed the above  documentation for accuracy and completeness, and I agree with the above. Briscoe Deutscher, DO

## 2020-12-26 NOTE — Addendum Note (Signed)
Addended by: Karren Cobble on: 12/26/2020 04:44 PM   Modules accepted: Orders

## 2020-12-27 ENCOUNTER — Telehealth (INDEPENDENT_AMBULATORY_CARE_PROVIDER_SITE_OTHER): Payer: Self-pay

## 2020-12-27 NOTE — Telephone Encounter (Signed)
Please review and advise.

## 2020-12-27 NOTE — Telephone Encounter (Signed)
Dr.Wallace °

## 2020-12-27 NOTE — Telephone Encounter (Signed)
The pts daughter [name] called in and stated that she was returning a call. The pt wanted to make sure we have to right number to give her a call back [phone]. Please advise

## 2020-12-29 ENCOUNTER — Other Ambulatory Visit (INDEPENDENT_AMBULATORY_CARE_PROVIDER_SITE_OTHER): Payer: Self-pay | Admitting: Family Medicine

## 2020-12-29 DIAGNOSIS — I10 Essential (primary) hypertension: Secondary | ICD-10-CM

## 2020-12-31 NOTE — Telephone Encounter (Signed)
Dr.Wallace °

## 2021-01-01 NOTE — Telephone Encounter (Signed)
Spoke with [name] and verified they did receive the list that Dr Juleen China wrote down for them. They have already taken care of everything besides waiting on the liver doctor to return their call. Katherine [name] is tracking her blood pressures at home. She also stated they are going to try to start coming with pt to her appointments since he is having a difficult time remembering what is said at her appointments.

## 2021-01-08 ENCOUNTER — Ambulatory Visit (INDEPENDENT_AMBULATORY_CARE_PROVIDER_SITE_OTHER): Payer: Medicare Other | Admitting: Family Medicine

## 2021-01-08 ENCOUNTER — Other Ambulatory Visit: Payer: Self-pay

## 2021-01-08 ENCOUNTER — Encounter (INDEPENDENT_AMBULATORY_CARE_PROVIDER_SITE_OTHER): Payer: Self-pay | Admitting: Family Medicine

## 2021-01-08 VITALS — BP 128/76 | HR 71 | Temp 97.6°F | Ht 66.0 in | Wt 280.0 lb

## 2021-01-08 DIAGNOSIS — K7581 Nonalcoholic steatohepatitis (NASH): Secondary | ICD-10-CM | POA: Diagnosis not present

## 2021-01-08 DIAGNOSIS — I152 Hypertension secondary to endocrine disorders: Secondary | ICD-10-CM

## 2021-01-08 DIAGNOSIS — R5383 Other fatigue: Secondary | ICD-10-CM | POA: Diagnosis not present

## 2021-01-08 DIAGNOSIS — Z794 Long term (current) use of insulin: Secondary | ICD-10-CM

## 2021-01-08 DIAGNOSIS — Z6841 Body Mass Index (BMI) 40.0 and over, adult: Secondary | ICD-10-CM

## 2021-01-08 DIAGNOSIS — Z79899 Other long term (current) drug therapy: Secondary | ICD-10-CM | POA: Diagnosis not present

## 2021-01-08 DIAGNOSIS — E1159 Type 2 diabetes mellitus with other circulatory complications: Secondary | ICD-10-CM

## 2021-01-08 DIAGNOSIS — E1169 Type 2 diabetes mellitus with other specified complication: Secondary | ICD-10-CM | POA: Diagnosis not present

## 2021-01-08 DIAGNOSIS — R5381 Other malaise: Secondary | ICD-10-CM

## 2021-01-08 DIAGNOSIS — K7460 Unspecified cirrhosis of liver: Secondary | ICD-10-CM

## 2021-01-10 NOTE — Progress Notes (Signed)
Chief Complaint:   OBESITY Katherine [name] is here to discuss her progress with her obesity treatment plan along with follow-up of her obesity related diagnoses. See Medical Weight Management Flowsheet for complete bioelectrical impedance results.  Today's visit was #: 20 Starting weight: 293 lbs Starting date: 10/13/2019 Weight change since last visit: +10 lbs Total lbs lost to date: 13 lbs Total weight loss percentage to date: -4.44%  Nutrition Plan: Keeping a food journal and adhering to recommended goals of 1200-1500 calories and 40 grams of protein daily for 0% of the time. Activity: None. Anti-obesity medications: Ozempic 1 mg subcutaneously weekly. Reported side effects: None.  Interim History: Katherine [name] has been eating toast and mashed potatoes.  She says she cannot tolerate Lactulose.  Blood sugars - 141, 160.  She is on Ozempic 1 mg and [name] 20 units.  Blood pressures are up - averaging 120s-140s.  Today's bioimpedance results indicate that Katherine [name] has gained 10 pounds of water weight since her last visit.  Assessment/Plan:   1. IDDM Diabetes Mellitus: Generally controlled, but now needing insulin again. Recently with diverticulitis that may have elevated blood sugars. Concern for dehydration and food indiscretions. Medication: as above. Issues reviewed: blood sugar goals, complications of diabetes mellitus, hypoglycemia prevention and treatment, exercise, and nutrition.  Plan: The patient was encouraged to monitor blood sugars regularly and to bring their log to the next appointment for review.   Lab Results  Component Value Date   HGBA1C 5.9 03/12/2020   HGBA1C 7.3 09/22/2019   HGBA1C 6.5 (H) 07/28/2018   Lab Results  Component Value Date   MICROALBUR 2.86 04/19/2020   LDLCALC 29 12/13/2019   CREATININE 1.48 (H) 12/25/2020   2. Liver cirrhosis secondary to NASH (nonalcoholic steatohepatitis) ([place]) Followed by Hepatology. Recently started Lactulose for presumed  hepatic encephalopathy. Consistently unable to tolerate the Lactulose.   3. Hypertension associated with type 2 diabetes mellitus ([place]) Controlled. Medications: Bystolic 10 mg daily. HCTZ was held at last visit due to symptomatic hypotension and signs of dehydration.  Plan: Add back HCTZ but be aware of fluid status. Check BP, HR daily and bring in for review. Avoid buying foods that are: processed, frozen, or prepackaged to avoid excess salt.   BP Readings from Last 3 Encounters:  01/08/21 128/76  12/25/20 (!) 87/59  12/17/20 123/75   Lab Results  Component Value Date   CREATININE 1.48 (H) 12/25/2020   4. Medication management Medication list reviewed together today.   5. Malaise and fatigue Worsening. Multifactorial. I sent communication to her son and daughter-in-law to monitor at home. Once a little more stable, would benefit from PT.   6. Obesity with current BMI of 45.3  Course: Katherine [name] is currently in the action stage of change. As such, her goal is to continue with weight loss efforts.   Nutrition goals: She has agreed to keeping a food journal and adhering to recommended goals of 1200-1500 calories and 40 grams of protein.   Exercise goals: No exercise has been prescribed at this time.  Behavioral modification strategies: increasing lean protein intake, decreasing simple carbohydrates, increasing vegetables, increasing water intake, and decreasing liquid calories.  Katherine [name] has agreed to follow-up with our clinic in 4 weeks. She was informed of the importance of frequent follow-up visits to maximize her success with intensive lifestyle modifications for her multiple health conditions.   Objective:   Blood pressure 128/76, pulse 71, temperature 97.6 F (36.4 C), temperature source Oral, height 5' 6"  (1.676 m),  weight 280 lb (127 kg), SpO2 94 %. Body mass index is 45.19 kg/m.  General: Cooperative, alert, well developed, in no acute distress. HEENT: Conjunctivae  and lids unremarkable. Cardiovascular: Regular rhythm.  Lungs: Normal work of breathing. Neurologic: No focal deficits.   Lab Results  Component Value Date   CREATININE 1.48 (H) 12/25/2020   BUN 24 12/25/2020   NA 137 12/25/2020   K 3.3 (L) 12/25/2020   CL 94 (L) 12/25/2020   CO2 30 (H) 12/25/2020   Lab Results  Component Value Date   ALT 36 12/17/2020   AST 49 (H) 12/17/2020   ALKPHOS 80 12/17/2020   BILITOT 0.7 12/17/2020   Lab Results  Component Value Date   HGBA1C 5.9 03/12/2020   HGBA1C 7.3 09/22/2019   HGBA1C 6.5 (H) 07/28/2018   HGBA1C 10.1 (H) 09/08/2012   HGBA1C 10.1 (H) 09/07/2012   Lab Results  Component Value Date   TSH 3.45 12/13/2019   Lab Results  Component Value Date   CHOL 95 12/13/2019   HDL 44 12/13/2019   LDLCALC 29 12/13/2019   TRIG 123 12/13/2019   CHOLHDL 2.8 08/19/2017   Lab Results  Component Value Date   WBC 10.7 (H) 12/17/2020   HGB 15.5 (H) 12/17/2020   HCT 45.6 12/17/2020   MCV 78.5 (L) 12/17/2020   PLT 171 12/17/2020   Lab Results  Component Value Date   IRON 39 08/19/2017   TIBC 404 08/19/2017   Attestation Statements:   Reviewed by clinician on day of visit: allergies, medications, problem list, medical history, surgical history, family history, social history, and previous encounter notes.  Time spent on visit including pre-visit chart review and post-visit care and documentation was 45 minutes. Time was spent on: Food choices and timing of food intake reviewed today. I performed a medically necessary appropriate examination and/or evaluation. I discussed the assessment and treatment plan with the patient. The patient was provided an opportunity to ask questions and all were answered. The patient agreed with the plan and demonstrated an understanding of the instructions. Clinical information was updated and documented in the EMR.  I, Water quality scientist, CMA, am acting as transcriptionist for Briscoe Deutscher, DO  I have reviewed the  above documentation for accuracy and completeness, and I agree with the above. -  Briscoe Deutscher, DO, MS, FAAFP, DABOM - Family and Bariatric Medicine.

## 2021-01-11 ENCOUNTER — Other Ambulatory Visit (INDEPENDENT_AMBULATORY_CARE_PROVIDER_SITE_OTHER): Payer: Self-pay | Admitting: Family Medicine

## 2021-01-11 DIAGNOSIS — E1159 Type 2 diabetes mellitus with other circulatory complications: Secondary | ICD-10-CM

## 2021-01-11 DIAGNOSIS — I152 Hypertension secondary to endocrine disorders: Secondary | ICD-10-CM

## 2021-01-14 NOTE — Telephone Encounter (Signed)
Katherine [name]

## 2021-01-15 ENCOUNTER — Encounter: Payer: Self-pay | Admitting: Registered Nurse

## 2021-01-15 ENCOUNTER — Encounter: Payer: Medicare Other | Attending: Registered Nurse | Admitting: Registered Nurse

## 2021-01-15 ENCOUNTER — Other Ambulatory Visit: Payer: Self-pay

## 2021-01-15 VITALS — BP 129/81 | HR 73 | Ht 66.0 in | Wt 280.0 lb

## 2021-01-15 DIAGNOSIS — Z5181 Encounter for therapeutic drug level monitoring: Secondary | ICD-10-CM

## 2021-01-15 DIAGNOSIS — M47816 Spondylosis without myelopathy or radiculopathy, lumbar region: Secondary | ICD-10-CM

## 2021-01-15 DIAGNOSIS — M1711 Unilateral primary osteoarthritis, right knee: Secondary | ICD-10-CM

## 2021-01-15 DIAGNOSIS — R202 Paresthesia of skin: Secondary | ICD-10-CM

## 2021-01-15 DIAGNOSIS — Z79891 Long term (current) use of opiate analgesic: Secondary | ICD-10-CM

## 2021-01-15 DIAGNOSIS — S8412XS Injury of peroneal nerve at lower leg level, left leg, sequela: Secondary | ICD-10-CM | POA: Diagnosis not present

## 2021-01-15 DIAGNOSIS — G894 Chronic pain syndrome: Secondary | ICD-10-CM | POA: Diagnosis not present

## 2021-01-15 MED ORDER — OXYCODONE HCL 10 MG PO TABS: 10.0000 mg | ORAL_TABLET | Freq: Three times a day (TID) | ORAL | 0 refills | Status: DC | PRN
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
## 2021-01-20 ENCOUNTER — Other Ambulatory Visit: Payer: Self-pay | Admitting: Registered Nurse

## 2021-01-20 DIAGNOSIS — M19172 Post-traumatic osteoarthritis, left ankle and foot: Secondary | ICD-10-CM

## 2021-01-20 DIAGNOSIS — G5712 Meralgia paresthetica, left lower limb: Secondary | ICD-10-CM

## 2021-01-20 DIAGNOSIS — M170 Bilateral primary osteoarthritis of knee: Secondary | ICD-10-CM

## 2021-01-22 DIAGNOSIS — K7460 Unspecified cirrhosis of liver: Secondary | ICD-10-CM | POA: Diagnosis not present

## 2021-01-22 DIAGNOSIS — K7682 Hepatic encephalopathy: Secondary | ICD-10-CM | POA: Diagnosis not present

## 2021-01-22 DIAGNOSIS — K7581 Nonalcoholic steatohepatitis (NASH): Secondary | ICD-10-CM | POA: Diagnosis not present

## 2021-01-29 ENCOUNTER — Encounter (INDEPENDENT_AMBULATORY_CARE_PROVIDER_SITE_OTHER): Payer: Self-pay | Admitting: Family Medicine

## 2021-01-29 ENCOUNTER — Other Ambulatory Visit: Payer: Self-pay

## 2021-01-29 ENCOUNTER — Ambulatory Visit (INDEPENDENT_AMBULATORY_CARE_PROVIDER_SITE_OTHER): Payer: Medicare Other | Admitting: Family Medicine

## 2021-01-29 VITALS — BP 115/74 | HR 74 | Temp 97.8°F | Ht 66.0 in | Wt 274.0 lb

## 2021-01-29 DIAGNOSIS — Z6841 Body Mass Index (BMI) 40.0 and over, adult: Secondary | ICD-10-CM

## 2021-01-29 DIAGNOSIS — R5381 Other malaise: Secondary | ICD-10-CM | POA: Diagnosis not present

## 2021-01-29 DIAGNOSIS — K5909 Other constipation: Secondary | ICD-10-CM | POA: Diagnosis not present

## 2021-01-29 DIAGNOSIS — E1159 Type 2 diabetes mellitus with other circulatory complications: Secondary | ICD-10-CM | POA: Diagnosis not present

## 2021-01-29 DIAGNOSIS — E1169 Type 2 diabetes mellitus with other specified complication: Secondary | ICD-10-CM

## 2021-01-29 DIAGNOSIS — I152 Hypertension secondary to endocrine disorders: Secondary | ICD-10-CM

## 2021-01-29 DIAGNOSIS — Z794 Long term (current) use of insulin: Secondary | ICD-10-CM

## 2021-01-29 DIAGNOSIS — R5383 Other fatigue: Secondary | ICD-10-CM

## 2021-01-29 DIAGNOSIS — K7460 Unspecified cirrhosis of liver: Secondary | ICD-10-CM | POA: Diagnosis not present

## 2021-01-29 DIAGNOSIS — R2681 Unsteadiness on feet: Secondary | ICD-10-CM | POA: Diagnosis not present

## 2021-01-29 DIAGNOSIS — K7581 Nonalcoholic steatohepatitis (NASH): Secondary | ICD-10-CM | POA: Diagnosis not present

## 2021-01-29 MED ORDER — OZEMPIC (1 MG/DOSE) 4 MG/3ML ~~LOC~~ SOPN: 1.0000 mg | PEN_INJECTOR | SUBCUTANEOUS | 0 refills | Status: DC

## 2021-01-29 MED ORDER — LINACLOTIDE 145 MCG PO CAPS: 145.0000 ug | ORAL_CAPSULE | Freq: Every day | ORAL | 0 refills | Status: DC

## 2021-01-30 LAB — COMPREHENSIVE METABOLIC PANEL
ALT: 28 IU/L (ref 0–32)
AST: 44 IU/L — ABNORMAL HIGH (ref 0–40)
Albumin/Globulin Ratio: 1.1 — ABNORMAL LOW (ref 1.2–2.2)
Albumin: 3.6 g/dL — ABNORMAL LOW (ref 3.8–4.8)
Alkaline Phosphatase: 84 IU/L (ref 44–121)
BUN/Creatinine Ratio: 20 (ref 12–28)
BUN: 21 mg/dL (ref 8–27)
Bilirubin Total: 0.4 mg/dL (ref 0.0–1.2)
CO2: 27 mmol/L (ref 20–29)
Calcium: 9.1 mg/dL (ref 8.7–10.3)
Chloride: 103 mmol/L (ref 96–106)
Creatinine, Ser: 1.04 mg/dL — ABNORMAL HIGH (ref 0.57–1.00)
Globulin, Total: 3.4 g/dL (ref 1.5–4.5)
Glucose: 152 mg/dL — ABNORMAL HIGH (ref 70–99)
Potassium: 4.1 mmol/L (ref 3.5–5.2)
Sodium: 144 mmol/L (ref 134–144)
Total Protein: 7 g/dL (ref 6.0–8.5)
eGFR: 60 mL/min/{1.73_m2} (ref >59)

## 2021-01-30 LAB — CBC WITH DIFFERENTIAL/PLATELET
Basophils Absolute: 0 10*3/uL (ref 0.0–0.2)
Basos: 1 %
EOS (ABSOLUTE): 0.2 10*3/uL (ref 0.0–0.4)
Eos: 5 %
Hematocrit: 40.6 % (ref 34.0–46.6)
Hemoglobin: 13.1 g/dL (ref 11.1–15.9)
Immature Grans (Abs): 0 10*3/uL (ref 0.0–0.1)
Immature Granulocytes: 0 %
Lymphocytes Absolute: 0.7 10*3/uL (ref 0.7–3.1)
Lymphs: 19 %
MCH: 26.1 pg — ABNORMAL LOW (ref 26.6–33.0)
MCHC: 32.3 g/dL (ref 31.5–35.7)
MCV: 81 fL (ref 79–97)
Monocytes Absolute: 0.4 10*3/uL (ref 0.1–0.9)
Monocytes: 10 %
Neutrophils Absolute: 2.4 10*3/uL (ref 1.4–7.0)
Neutrophils: 65 %
Platelets: 99 10*3/uL — CL (ref 150–450)
RBC: 5.01 x10E6/uL (ref 3.77–5.28)
RDW: 13.1 % (ref 11.7–15.4)
WBC: 3.7 10*3/uL (ref 3.4–10.8)

## 2021-01-30 LAB — TSH: TSH: 1.75 u[IU]/mL (ref 0.450–4.500)

## 2021-01-30 LAB — T4, FREE: Free T4: 0.74 ng/dL — ABNORMAL LOW (ref 0.82–1.77)

## 2021-01-30 LAB — VITAMIN B12: Vitamin B-12: 1084 pg/mL (ref 232–1245)

## 2021-02-04 NOTE — Progress Notes (Signed)
Chief Complaint:   OBESITY Katherine [name] is here to discuss her progress with her obesity treatment plan along with follow-up of her obesity related diagnoses. See Medical Weight Management Flowsheet for complete bioelectrical impedance results.  Today's visit was #: 21 Starting weight: 293 lbs Starting date: 10/13/2019 Weight change since last visit: 6 lbs Total lbs lost to date: 19 lbs Total weight loss percentage to date: -6.48%  Nutrition Plan: Keeping a food journal and adhering to recommended goals of 1200-1500 calories and 40 grams of protein daily for 10% of the time. Activity: None. Anti-obesity medications: Ozempic 1 mg twice weekly. Reported side effects: None.  Interim History: FBS 140.  Blood pressure within normal limits.  Ozempic subcutaneously twice weekly.  She says she does not have an appetite.  She has protein shakes in the morning and at noon.  She is taking 20 units of insulin.  Assessment/Plan:   1. Chronic constipation This problem is controlled with Linzess. She had stopped it when having frequent diarrhea due to the Lactulose.  - Refill linaclotide (LINZESS) 145 MCG CAPS capsule; Take 1 capsule (145 mcg total) by mouth daily before breakfast.  Dispense: 90 capsule; Refill: 0  2. IDDM Diabetes Mellitus: Controlled. Medication: Ozempic 1 mg twice weekly, [name]. Issues reviewed: blood sugar goals, complications of diabetes mellitus, hypoglycemia prevention and treatment, exercise, and nutrition.  Plan: The patient will continue to focus on protein-rich, low simple carbohydrate foods. We reviewed the importance of hydration, regular exercise for stress reduction, and restorative sleep.   Lab Results  Component Value Date   HGBA1C 5.9 03/12/2020   HGBA1C 7.3 09/22/2019   HGBA1C 6.5 (H) 07/28/2018   Lab Results  Component Value Date   MICROALBUR 2.86 04/19/2020   LDLCALC 29 12/13/2019   CREATININE 1.04 (H) 01/29/2021   - Refill Semaglutide, 1 MG/DOSE,  (OZEMPIC, 1 MG/DOSE,) 4 MG/3ML SOPN; Inject 1 mg into the skin 2 (two) times a week.  Dispense: 6 mL; Refill: 0  3. Hypertension associated with type 2 diabetes mellitus ([place]) At goal. Medications: HCTZ 18.2 mg daily, Bystolic 10 mg daily.   Plan: Avoid buying foods that are: processed, frozen, or prepackaged to avoid excess salt. We will watch for signs of hypotension as she continues lifestyle modifications. Check labs today.  BP Readings from Last 3 Encounters:  01/29/21 115/74  01/15/21 129/81  01/08/21 128/76   Lab Results  Component Value Date   CREATININE 1.04 (H) 01/29/2021   - CBC with Differential/Platelet - T4, free - TSH  4. Liver cirrhosis secondary to NASH (nonalcoholic steatohepatitis) ([place]) Intensive lifestyle modifications are the first line treatment for this issue. We discussed several lifestyle modifications today and she will continue to work on diet, exercise and weight loss efforts.  She stopped Lactulose due to intolerance.  - CBC with Differential/Platelet - Comprehensive metabolic panel  5. Malaise and fatigue Will check vitamin B12 level today.  - Vitamin B12  6. Obesity BMI today is 44  Course: Katherine [name] is currently in the action stage of change. As such, her goal is to continue with weight loss efforts.   Nutrition goals: She has agreed to practicing portion control and making smarter food choices, such as increasing vegetables and decreasing simple carbohydrates. Protein goal 65-85 grams.  Exercise goals: Older adults should follow the adult guidelines. When older adults cannot meet the adult guidelines, they should be as physically active as their abilities and conditions will allow.  Older adults should do exercises  that maintain or improve balance if they are at risk of falling.   Behavioral modification strategies: increasing lean protein intake, decreasing simple carbohydrates, increasing vegetables, and increasing water intake.  Katherine [name]  has agreed to follow-up with our clinic in 2-4 weeks. She was informed of the importance of frequent follow-up visits to maximize her success with intensive lifestyle modifications for her multiple health conditions.   Objective:   Blood pressure 115/74, pulse 74, temperature 97.8 F (36.6 C), temperature source Oral, height 5' 6"  (1.676 m), weight 274 lb (124.3 kg), SpO2 93 %. Body mass index is 44.22 kg/m.  General: Cooperative, alert, well developed, in no acute distress. HEENT: Conjunctivae and lids unremarkable. Cardiovascular: Regular rhythm.  Lungs: Normal work of breathing. Neurologic: No focal deficits.   Lab Results  Component Value Date   CREATININE 1.04 (H) 01/29/2021   BUN 21 01/29/2021   NA 144 01/29/2021   K 4.1 01/29/2021   CL 103 01/29/2021   CO2 27 01/29/2021   Lab Results  Component Value Date   ALT 28 01/29/2021   AST 44 (H) 01/29/2021   ALKPHOS 84 01/29/2021   BILITOT 0.4 01/29/2021   Lab Results  Component Value Date   HGBA1C 5.9 03/12/2020   HGBA1C 7.3 09/22/2019   HGBA1C 6.5 (H) 07/28/2018   HGBA1C 10.1 (H) 09/08/2012   HGBA1C 10.1 (H) 09/07/2012   Lab Results  Component Value Date   TSH 1.750 01/29/2021   Lab Results  Component Value Date   CHOL 95 12/13/2019   HDL 44 12/13/2019   LDLCALC 29 12/13/2019   TRIG 123 12/13/2019   CHOLHDL 2.8 08/19/2017   Lab Results  Component Value Date   WBC 3.7 01/29/2021   HGB 13.1 01/29/2021   HCT 40.6 01/29/2021   MCV 81 01/29/2021   PLT 99 (LL) 01/29/2021   Lab Results  Component Value Date   IRON 39 08/19/2017   TIBC 404 08/19/2017   Attestation Statements:   Reviewed by clinician on day of visit: allergies, medications, problem list, medical history, surgical history, family history, social history, and previous encounter notes.  Time spent on visit including pre-visit chart review and post-visit care and documentation was 45 minutes.Time was spent on: Food choices and timing of food  intake reviewed today. I discussed a personalized meal plan with the patient that will help her to lose weight and will improve her obesity-related conditions going forward. I performed a medically necessary appropriate examination and/or evaluation. I discussed the assessment and treatment plan with the patient. Motivational interviewing as well as evidence-based interventions for health behavior change were utilized today including the discussion of self monitoring techniques, problem-solving barriers and SMART goal setting techniques.  An exercise prescription was reviewed.  The patient was provided an opportunity to ask questions and all were answered. The patient agreed with the plan and demonstrated an understanding of the instructions. Labs were ordered at this visit and will be reviewed at the next visit unless more critical results need to be addressed immediately. Clinical information was updated and documented in the EMR.  I, Water quality scientist, CMA, am acting as transcriptionist for Briscoe Deutscher, DO  I have reviewed the above documentation for accuracy and completeness, and I agree with the above. -  Briscoe Deutscher, DO, MS, FAAFP, DABOM - Family and Bariatric Medicine.

## 2021-02-06 ENCOUNTER — Other Ambulatory Visit (INDEPENDENT_AMBULATORY_CARE_PROVIDER_SITE_OTHER): Payer: Self-pay | Admitting: Family Medicine

## 2021-02-06 DIAGNOSIS — E1159 Type 2 diabetes mellitus with other circulatory complications: Secondary | ICD-10-CM

## 2021-02-06 DIAGNOSIS — I152 Hypertension secondary to endocrine disorders: Secondary | ICD-10-CM

## 2021-02-14 ENCOUNTER — Other Ambulatory Visit: Payer: Self-pay | Admitting: Registered Nurse

## 2021-02-14 DIAGNOSIS — M7121 Synovial cyst of popliteal space [Baker], right knee: Secondary | ICD-10-CM

## 2021-02-14 NOTE — Telephone Encounter (Signed)
Pharmacy is sending a request for Diclofenac tablets. I only see Diclofenac gel in her notes. Would you like to refill?

## 2021-02-17 ENCOUNTER — Other Ambulatory Visit (INDEPENDENT_AMBULATORY_CARE_PROVIDER_SITE_OTHER): Payer: Self-pay | Admitting: Family Medicine

## 2021-02-17 DIAGNOSIS — Z794 Long term (current) use of insulin: Secondary | ICD-10-CM

## 2021-02-18 NOTE — Telephone Encounter (Signed)
Last OV with Dr Wallace 

## 2021-02-18 NOTE — Telephone Encounter (Signed)
Placed a call to Ms. Yeater, no answer. Left message to return the call.  Regarding her Diclofenac

## 2021-02-19 ENCOUNTER — Telehealth: Payer: Self-pay

## 2021-02-19 NOTE — Telephone Encounter (Signed)
Patient states she is only taking the Diclofenac gel and not the tablets. She stated she has told the pharmacy several times to take that off her profile and they keep sending request for it.

## 2021-02-19 NOTE — Telephone Encounter (Signed)
Patient left voicemail stating Katherine Sensing, NP called her and she was returning her call

## 2021-02-19 NOTE — Telephone Encounter (Signed)
Error

## 2021-03-08 ENCOUNTER — Other Ambulatory Visit (INDEPENDENT_AMBULATORY_CARE_PROVIDER_SITE_OTHER): Payer: Self-pay | Admitting: Family Medicine

## 2021-03-08 DIAGNOSIS — E1159 Type 2 diabetes mellitus with other circulatory complications: Secondary | ICD-10-CM

## 2021-03-08 DIAGNOSIS — I10 Essential (primary) hypertension: Secondary | ICD-10-CM

## 2021-03-11 DIAGNOSIS — R1032 Left lower quadrant pain: Secondary | ICD-10-CM | POA: Diagnosis not present

## 2021-03-12 ENCOUNTER — Ambulatory Visit (INDEPENDENT_AMBULATORY_CARE_PROVIDER_SITE_OTHER): Payer: Medicare Other | Admitting: Family Medicine

## 2021-03-15 DIAGNOSIS — K5732 Diverticulitis of large intestine without perforation or abscess without bleeding: Secondary | ICD-10-CM | POA: Diagnosis not present

## 2021-03-15 DIAGNOSIS — K7460 Unspecified cirrhosis of liver: Secondary | ICD-10-CM | POA: Diagnosis not present

## 2021-03-15 DIAGNOSIS — K5904 Chronic idiopathic constipation: Secondary | ICD-10-CM | POA: Diagnosis not present

## 2021-03-20 ENCOUNTER — Other Ambulatory Visit (INDEPENDENT_AMBULATORY_CARE_PROVIDER_SITE_OTHER): Payer: Self-pay | Admitting: Family Medicine

## 2021-03-20 ENCOUNTER — Encounter (INDEPENDENT_AMBULATORY_CARE_PROVIDER_SITE_OTHER): Payer: Self-pay

## 2021-03-20 DIAGNOSIS — E038 Other specified hypothyroidism: Secondary | ICD-10-CM

## 2021-03-20 NOTE — Telephone Encounter (Signed)
Pt last seen by Dr. Wallace.  

## 2021-03-20 NOTE — Telephone Encounter (Signed)
Msg sent to pt

## 2021-03-22 ENCOUNTER — Other Ambulatory Visit (INDEPENDENT_AMBULATORY_CARE_PROVIDER_SITE_OTHER): Payer: Self-pay | Admitting: Family Medicine

## 2021-03-22 ENCOUNTER — Other Ambulatory Visit: Payer: Self-pay

## 2021-03-22 ENCOUNTER — Encounter: Payer: Self-pay | Admitting: Registered Nurse

## 2021-03-22 ENCOUNTER — Encounter: Payer: Medicare Other | Attending: Registered Nurse | Admitting: Registered Nurse

## 2021-03-22 VITALS — BP 138/80 | HR 88 | Ht 66.0 in | Wt 276.0 lb

## 2021-03-22 DIAGNOSIS — M1711 Unilateral primary osteoarthritis, right knee: Secondary | ICD-10-CM | POA: Diagnosis not present

## 2021-03-22 DIAGNOSIS — M25511 Pain in right shoulder: Secondary | ICD-10-CM | POA: Insufficient documentation

## 2021-03-22 DIAGNOSIS — M47816 Spondylosis without myelopathy or radiculopathy, lumbar region: Secondary | ICD-10-CM | POA: Insufficient documentation

## 2021-03-22 DIAGNOSIS — Z5181 Encounter for therapeutic drug level monitoring: Secondary | ICD-10-CM | POA: Insufficient documentation

## 2021-03-22 DIAGNOSIS — Z79891 Long term (current) use of opiate analgesic: Secondary | ICD-10-CM | POA: Diagnosis not present

## 2021-03-22 DIAGNOSIS — G894 Chronic pain syndrome: Secondary | ICD-10-CM | POA: Insufficient documentation

## 2021-03-22 DIAGNOSIS — R202 Paresthesia of skin: Secondary | ICD-10-CM | POA: Insufficient documentation

## 2021-03-22 DIAGNOSIS — G8929 Other chronic pain: Secondary | ICD-10-CM | POA: Insufficient documentation

## 2021-03-22 DIAGNOSIS — S8412XS Injury of peroneal nerve at lower leg level, left leg, sequela: Secondary | ICD-10-CM | POA: Diagnosis not present

## 2021-03-22 DIAGNOSIS — I152 Hypertension secondary to endocrine disorders: Secondary | ICD-10-CM

## 2021-03-22 MED ORDER — OXYCODONE HCL 10 MG PO TABS: 10.0000 mg | ORAL_TABLET | Freq: Three times a day (TID) | ORAL | 0 refills | Status: DC | PRN
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
## 2021-03-25 NOTE — Telephone Encounter (Signed)
Dr.Wallace °

## 2021-03-30 ENCOUNTER — Other Ambulatory Visit: Payer: Self-pay | Admitting: Registered Nurse

## 2021-03-30 ENCOUNTER — Other Ambulatory Visit (INDEPENDENT_AMBULATORY_CARE_PROVIDER_SITE_OTHER): Payer: Self-pay | Admitting: Family Medicine

## 2021-03-30 DIAGNOSIS — I10 Essential (primary) hypertension: Secondary | ICD-10-CM

## 2021-03-30 DIAGNOSIS — G894 Chronic pain syndrome: Secondary | ICD-10-CM

## 2021-03-30 DIAGNOSIS — M47816 Spondylosis without myelopathy or radiculopathy, lumbar region: Secondary | ICD-10-CM

## 2021-03-30 DIAGNOSIS — S8412XS Injury of peroneal nerve at lower leg level, left leg, sequela: Secondary | ICD-10-CM

## 2021-04-01 NOTE — Telephone Encounter (Signed)
Dr.Wallace °

## 2021-04-03 ENCOUNTER — Telehealth: Payer: Self-pay | Admitting: Registered Nurse

## 2021-04-03 DIAGNOSIS — M47816 Spondylosis without myelopathy or radiculopathy, lumbar region: Secondary | ICD-10-CM

## 2021-04-03 DIAGNOSIS — G894 Chronic pain syndrome: Secondary | ICD-10-CM

## 2021-04-03 MED ORDER — METHOCARBAMOL 500 MG PO TABS: 500.0000 mg | ORAL_TABLET | Freq: Four times a day (QID) | ORAL | 3 refills | Status: DC | PRN

## 2021-04-03 NOTE — Telephone Encounter (Signed)
Placed a call to [name], She reports she is having muscle cramps in her bilateral lower extremities and needed a refill on her Methocarbamol. Methocarbamol ordered. She verbalizes understanding.

## 2021-04-07 ENCOUNTER — Other Ambulatory Visit (INDEPENDENT_AMBULATORY_CARE_PROVIDER_SITE_OTHER): Payer: Self-pay | Admitting: Family Medicine

## 2021-04-07 DIAGNOSIS — E1159 Type 2 diabetes mellitus with other circulatory complications: Secondary | ICD-10-CM

## 2021-04-07 DIAGNOSIS — I152 Hypertension secondary to endocrine disorders: Secondary | ICD-10-CM

## 2021-04-08 ENCOUNTER — Other Ambulatory Visit: Payer: Self-pay | Admitting: Registered Nurse

## 2021-04-08 DIAGNOSIS — M19172 Post-traumatic osteoarthritis, left ankle and foot: Secondary | ICD-10-CM

## 2021-04-08 DIAGNOSIS — G5712 Meralgia paresthetica, left lower limb: Secondary | ICD-10-CM

## 2021-04-08 DIAGNOSIS — M170 Bilateral primary osteoarthritis of knee: Secondary | ICD-10-CM

## 2021-04-08 NOTE — Telephone Encounter (Signed)
Last OV with Dr Wallace 

## 2021-04-09 ENCOUNTER — Encounter (INDEPENDENT_AMBULATORY_CARE_PROVIDER_SITE_OTHER): Payer: Self-pay | Admitting: Family Medicine

## 2021-04-09 ENCOUNTER — Ambulatory Visit (INDEPENDENT_AMBULATORY_CARE_PROVIDER_SITE_OTHER): Payer: Medicare Other | Admitting: Family Medicine

## 2021-04-09 ENCOUNTER — Other Ambulatory Visit: Payer: Self-pay

## 2021-04-09 ENCOUNTER — Other Ambulatory Visit (INDEPENDENT_AMBULATORY_CARE_PROVIDER_SITE_OTHER): Payer: Self-pay | Admitting: Family Medicine

## 2021-04-09 VITALS — BP 113/80 | HR 84 | Temp 98.3°F | Ht 66.0 in | Wt 275.0 lb

## 2021-04-09 DIAGNOSIS — E1169 Type 2 diabetes mellitus with other specified complication: Secondary | ICD-10-CM

## 2021-04-09 DIAGNOSIS — Z6841 Body Mass Index (BMI) 40.0 and over, adult: Secondary | ICD-10-CM

## 2021-04-09 DIAGNOSIS — L219 Seborrheic dermatitis, unspecified: Secondary | ICD-10-CM

## 2021-04-09 DIAGNOSIS — K5792 Diverticulitis of intestine, part unspecified, without perforation or abscess without bleeding: Secondary | ICD-10-CM

## 2021-04-09 DIAGNOSIS — E038 Other specified hypothyroidism: Secondary | ICD-10-CM | POA: Diagnosis not present

## 2021-04-09 DIAGNOSIS — E669 Obesity, unspecified: Secondary | ICD-10-CM

## 2021-04-09 DIAGNOSIS — Z794 Long term (current) use of insulin: Secondary | ICD-10-CM

## 2021-04-09 DIAGNOSIS — K5909 Other constipation: Secondary | ICD-10-CM | POA: Diagnosis not present

## 2021-04-09 MED ORDER — LINACLOTIDE 145 MCG PO CAPS: 145.0000 ug | ORAL_CAPSULE | Freq: Every day | ORAL | 0 refills | Status: DC

## 2021-04-09 MED ORDER — CIPROFLOXACIN HCL 500 MG PO TABS: 500.0000 mg | ORAL_TABLET | Freq: Two times a day (BID) | ORAL | 0 refills | Status: DC

## 2021-04-09 MED ORDER — OZEMPIC (1 MG/DOSE) 4 MG/3ML ~~LOC~~ SOPN: 1.0000 mg | PEN_INJECTOR | SUBCUTANEOUS | 0 refills | Status: DC

## 2021-04-09 MED ORDER — LEVOTHYROXINE SODIUM 25 MCG PO TABS: 25.0000 ug | ORAL_TABLET | Freq: Every day | ORAL | 0 refills | Status: DC

## 2021-04-09 MED ORDER — METRONIDAZOLE 500 MG PO TABS: 500.0000 mg | ORAL_TABLET | Freq: Three times a day (TID) | ORAL | 0 refills | Status: AC

## 2021-04-09 MED ORDER — MUPIROCIN 2 % EX OINT: 1.0000 "application " | TOPICAL_OINTMENT | Freq: Two times a day (BID) | CUTANEOUS | 0 refills | Status: DC

## 2021-04-10 NOTE — Progress Notes (Signed)
Chief Complaint:   OBESITY Katherine [name] is here to discuss her progress with her obesity treatment plan along with follow-up of her obesity related diagnoses. See Medical Weight Management Flowsheet for complete bioelectrical impedance results.  Today's visit was #: 22 Starting weight: 293 lbs Starting date: 10/13/2019 Weight change since last visit: +1 lb Total lbs lost to date: 18 lbs Total weight loss percentage to date: -6.14%  Nutrition Plan: Practicing portion control and making smarter food choices, such as increasing vegetables and decreasing simple carbohydrates for 30% of the time. Activity: None. Anti-obesity medications: Ozempic 1 mg subcutaneously weekly. Reported side effects: None.  Interim History: Katherine [name] says she has not had a BM in 1 week - taking 2 Linzess in the morning.  She says she feels like she has diverticulitis again.  Assessment/Plan:   1. Seborrheic dermatitis New.  Consistent with fungal etiology but she does have evidence of early impetigo on her right nostril.   Plan:  Start mupirocin ointment 1 application twice daily.  - Start mupirocin ointment (BACTROBAN) 2 %; Apply 1 application topically 2 (two) times daily.  Dispense: 22 g; Refill: 0  2. Diverticulitis, recurrent Previously on Augmentin.  Start Flagyl 500 mg three times daily for 7 days plus Levaquin 500 mg daily for 7 days.  - Start metroNIDAZOLE (FLAGYL) 500 MG tablet; Take 1 tablet (500 mg total) by mouth 3 (three) times daily for 7 days.  Dispense: 21 tablet; Refill: 0  3. IDDM Diabetes Mellitus: Controlled. Medication: Ozempic 1 mg subcutaneously weekly, [name] 50 units daily. Issues reviewed: blood sugar goals, complications of diabetes mellitus, hypoglycemia prevention and treatment, exercise, and nutrition.  Plan: Continue medications.  Will refill Ozempic today, as per below.  The patient will continue to focus on protein-rich, low simple carbohydrate foods. We reviewed the  importance of hydration, regular exercise for stress reduction, and restorative sleep.   Lab Results  Component Value Date   HGBA1C 5.9 03/12/2020   HGBA1C 7.3 09/22/2019   HGBA1C 6.5 (H) 07/28/2018   Lab Results  Component Value Date   MICROALBUR 2.86 04/19/2020   LDLCALC 29 12/13/2019   CREATININE 1.04 (H) 01/29/2021   - Refill Semaglutide, 1 MG/DOSE, (OZEMPIC, 1 MG/DOSE,) 4 MG/3ML SOPN; Inject 1 mg into the skin once a week.  Dispense: 6 mL; Refill: 0  4. Other specified hypothyroidism Course: Stable. Medication: levothyroxine 25 mcg daily.   Plan: Patient was instructed not to take MVM or iron within 4 hours of taking thyroid medications.  We will continue to monitor alongside Endocrinology/PCP as it relates to her weight loss journey.   Lab Results  Component Value Date   TSH 1.750 01/29/2021   - Refill levothyroxine (SYNTHROID) 25 MCG tablet; Take 1 tablet (25 mcg total) by mouth daily before breakfast.  Dispense: 30 tablet; Refill: 0  5. Chronic constipation Katherine [name] has been taking Linzess 145 mcg 2 capsules in the morning.  Her constipation is usually controlled with 1 capsule. The worsening constipation is likely due to recurrent diverticulitis.   Plan:  Reviewed bowel regimen.  Continue Lizness.  Will refill today.  - Refill linaclotide (LINZESS) 145 MCG CAPS capsule; Take 1 capsule (145 mcg total) by mouth daily before breakfast.  Dispense: 90 capsule; Refill: 0  6. Obesity, BMI today is 44.5  Course: Katherine [name] is currently in the action stage of change. As such, her goal is to continue with weight loss efforts.   Nutrition goals: She has agreed to practicing portion  control and making smarter food choices, such as increasing vegetables and decreasing simple carbohydrates.   Exercise goals:  As is.  Behavioral modification strategies: increasing lean protein intake, decreasing simple carbohydrates, increasing vegetables, and increasing water intake.  Foster  has agreed to follow-up with our clinic in 4 weeks. She was informed of the importance of frequent follow-up visits to maximize her success with intensive lifestyle modifications for her multiple health conditions.   Objective:   Blood pressure 113/80, pulse 84, temperature 98.3 F (36.8 C), temperature source Oral, height 5' 6"  (1.676 m), weight 275 lb (124.7 kg), SpO2 95 %. Body mass index is 44.39 kg/m.  General: Cooperative, alert, well developed, in no acute distress. HEENT: Conjunctivae and lids unremarkable. Cardiovascular: Regular rhythm.  Lungs: Normal work of breathing. Neurologic: No focal deficits.   Lab Results  Component Value Date   CREATININE 1.04 (H) 01/29/2021   BUN 21 01/29/2021   NA 144 01/29/2021   K 4.1 01/29/2021   CL 103 01/29/2021   CO2 27 01/29/2021   Lab Results  Component Value Date   ALT 28 01/29/2021   AST 44 (H) 01/29/2021   ALKPHOS 84 01/29/2021   BILITOT 0.4 01/29/2021   Lab Results  Component Value Date   HGBA1C 5.9 03/12/2020   HGBA1C 7.3 09/22/2019   HGBA1C 6.5 (H) 07/28/2018   HGBA1C 10.1 (H) 09/08/2012   HGBA1C 10.1 (H) 09/07/2012   Lab Results  Component Value Date   TSH 1.750 01/29/2021   Lab Results  Component Value Date   CHOL 95 12/13/2019   HDL 44 12/13/2019   LDLCALC 29 12/13/2019   TRIG 123 12/13/2019   CHOLHDL 2.8 08/19/2017   Lab Results  Component Value Date   WBC 3.7 01/29/2021   HGB 13.1 01/29/2021   HCT 40.6 01/29/2021   MCV 81 01/29/2021   PLT 99 (LL) 01/29/2021   Lab Results  Component Value Date   IRON 39 08/19/2017   TIBC 404 08/19/2017   Attestation Statements:   Reviewed by clinician on day of visit: allergies, medications, problem list, medical history, surgical history, family history, social history, and previous encounter notes.  I, Water quality scientist, CMA, am acting as transcriptionist for Briscoe Deutscher, DO  I have reviewed the above documentation for accuracy and completeness, and I agree  with the above. -  Briscoe Deutscher, DO, MS, FAAFP, DABOM - Family and Bariatric Medicine.

## 2021-04-25 DIAGNOSIS — R300 Dysuria: Secondary | ICD-10-CM | POA: Diagnosis not present

## 2021-04-25 DIAGNOSIS — Z0000 Encounter for general adult medical examination without abnormal findings: Secondary | ICD-10-CM | POA: Diagnosis not present

## 2021-04-25 DIAGNOSIS — K7460 Unspecified cirrhosis of liver: Secondary | ICD-10-CM | POA: Diagnosis not present

## 2021-04-25 DIAGNOSIS — G8929 Other chronic pain: Secondary | ICD-10-CM | POA: Diagnosis not present

## 2021-04-25 DIAGNOSIS — I129 Hypertensive chronic kidney disease with stage 1 through stage 4 chronic kidney disease, or unspecified chronic kidney disease: Secondary | ICD-10-CM | POA: Diagnosis not present

## 2021-04-25 DIAGNOSIS — M25569 Pain in unspecified knee: Secondary | ICD-10-CM | POA: Diagnosis not present

## 2021-04-25 DIAGNOSIS — E1121 Type 2 diabetes mellitus with diabetic nephropathy: Secondary | ICD-10-CM | POA: Diagnosis not present

## 2021-04-25 DIAGNOSIS — R809 Proteinuria, unspecified: Secondary | ICD-10-CM | POA: Diagnosis not present

## 2021-04-25 DIAGNOSIS — N182 Chronic kidney disease, stage 2 (mild): Secondary | ICD-10-CM | POA: Diagnosis not present

## 2021-05-02 ENCOUNTER — Other Ambulatory Visit (INDEPENDENT_AMBULATORY_CARE_PROVIDER_SITE_OTHER): Payer: Self-pay | Admitting: Family Medicine

## 2021-05-02 DIAGNOSIS — E038 Other specified hypothyroidism: Secondary | ICD-10-CM

## 2021-05-02 NOTE — Telephone Encounter (Signed)
Refill request

## 2021-05-08 DIAGNOSIS — M25561 Pain in right knee: Secondary | ICD-10-CM | POA: Diagnosis not present

## 2021-05-08 DIAGNOSIS — M25562 Pain in left knee: Secondary | ICD-10-CM | POA: Diagnosis not present

## 2021-05-13 DIAGNOSIS — H35033 Hypertensive retinopathy, bilateral: Secondary | ICD-10-CM | POA: Diagnosis not present

## 2021-05-16 ENCOUNTER — Encounter (INDEPENDENT_AMBULATORY_CARE_PROVIDER_SITE_OTHER): Payer: Self-pay | Admitting: Family Medicine

## 2021-05-16 ENCOUNTER — Other Ambulatory Visit: Payer: Self-pay

## 2021-05-16 ENCOUNTER — Ambulatory Visit (INDEPENDENT_AMBULATORY_CARE_PROVIDER_SITE_OTHER): Payer: Medicare Other | Admitting: Family Medicine

## 2021-05-16 VITALS — BP 111/79 | HR 71 | Temp 97.6°F | Ht 66.0 in | Wt 271.0 lb

## 2021-05-16 DIAGNOSIS — E1169 Type 2 diabetes mellitus with other specified complication: Secondary | ICD-10-CM | POA: Diagnosis not present

## 2021-05-16 DIAGNOSIS — Z794 Long term (current) use of insulin: Secondary | ICD-10-CM | POA: Diagnosis not present

## 2021-05-16 DIAGNOSIS — K5792 Diverticulitis of intestine, part unspecified, without perforation or abscess without bleeding: Secondary | ICD-10-CM

## 2021-05-16 DIAGNOSIS — K5909 Other constipation: Secondary | ICD-10-CM

## 2021-05-16 DIAGNOSIS — I152 Hypertension secondary to endocrine disorders: Secondary | ICD-10-CM

## 2021-05-16 DIAGNOSIS — E669 Obesity, unspecified: Secondary | ICD-10-CM

## 2021-05-16 DIAGNOSIS — Z6841 Body Mass Index (BMI) 40.0 and over, adult: Secondary | ICD-10-CM

## 2021-05-16 DIAGNOSIS — E1159 Type 2 diabetes mellitus with other circulatory complications: Secondary | ICD-10-CM

## 2021-05-16 MED ORDER — LEVOFLOXACIN 500 MG PO TABS: 500.0000 mg | ORAL_TABLET | Freq: Every day | ORAL | 0 refills | Status: AC

## 2021-05-16 MED ORDER — METRONIDAZOLE 500 MG PO TABS: 500.0000 mg | ORAL_TABLET | Freq: Three times a day (TID) | ORAL | 0 refills | Status: AC

## 2021-05-16 MED ORDER — LINACLOTIDE 145 MCG PO CAPS: 145.0000 ug | ORAL_CAPSULE | Freq: Every day | ORAL | 0 refills | Status: DC

## 2021-05-16 MED ORDER — OZEMPIC (1 MG/DOSE) 4 MG/3ML ~~LOC~~ SOPN: 1.0000 mg | PEN_INJECTOR | SUBCUTANEOUS | 0 refills | Status: DC

## 2021-05-23 ENCOUNTER — Other Ambulatory Visit: Payer: Self-pay

## 2021-05-23 ENCOUNTER — Encounter: Payer: Medicare Other | Attending: Registered Nurse | Admitting: Registered Nurse

## 2021-05-23 VITALS — BP 122/79 | HR 75 | Ht 66.0 in | Wt 279.0 lb

## 2021-05-23 DIAGNOSIS — M47816 Spondylosis without myelopathy or radiculopathy, lumbar region: Secondary | ICD-10-CM | POA: Insufficient documentation

## 2021-05-23 DIAGNOSIS — M1711 Unilateral primary osteoarthritis, right knee: Secondary | ICD-10-CM | POA: Diagnosis not present

## 2021-05-23 DIAGNOSIS — Z5181 Encounter for therapeutic drug level monitoring: Secondary | ICD-10-CM | POA: Insufficient documentation

## 2021-05-23 DIAGNOSIS — G894 Chronic pain syndrome: Secondary | ICD-10-CM | POA: Insufficient documentation

## 2021-05-23 DIAGNOSIS — Z79891 Long term (current) use of opiate analgesic: Secondary | ICD-10-CM | POA: Diagnosis not present

## 2021-05-23 DIAGNOSIS — R202 Paresthesia of skin: Secondary | ICD-10-CM | POA: Diagnosis not present

## 2021-05-23 DIAGNOSIS — M1712 Unilateral primary osteoarthritis, left knee: Secondary | ICD-10-CM | POA: Diagnosis not present

## 2021-05-23 DIAGNOSIS — S8412XS Injury of peroneal nerve at lower leg level, left leg, sequela: Secondary | ICD-10-CM | POA: Diagnosis not present

## 2021-05-23 MED ORDER — OXYCODONE HCL 10 MG PO TABS: 10.0000 mg | ORAL_TABLET | Freq: Four times a day (QID) | ORAL | 0 refills | Status: DC | PRN

## 2021-05-23 NOTE — Patient Instructions (Signed)
Send a My Chart message in two weeks with a update on medication change.  ? ? ?

## 2021-05-23 NOTE — Progress Notes (Signed)
? ?Subjective:  ? ? Patient ID: Katherine [name], female    DOB: November 07, 1955, 66 y.o.   MRN: [id number] ? ?HPI: Katherine [name] is a 65 y.o. female who returns for follow up appointment for chronic pain and medication refill. She states her pain is located in her lower back, bilateral hips and bilateral knee pain. She also reports increase intensity of lower back pain and only receiving 3- 4 hours of relief of her pain with current medication regimen. She also reports generalized joint pain. She rates her pain 5. Her current exercise regime is walking short distances with her cane.  ? ?Katherine [name] equivalent is 45.00 MME.  She is also prescribed Clonazepam  by Dr. Brigitte Pulse .We have discussed the black box warning of using opioids and benzodiazepines. I highlighted the dangers of using these drugs together and discussed the adverse events including respiratory suppression, overdose, cognitive impairment and importance of compliance with current regimen. We will continue to monitor and adjust as indicated.  ? ?UDS ordered today.  ?  ?Katherine [name] reports she had cortisone injections in her bilateral  knees on May 08, 2021 at [place] [facility] [place]. ? ?Pain Inventory ?Average Pain 5 ?Pain Right Now 5 ?My pain is constant, stabbing, and aching ? ?In the last 24 hours, has pain interfered with the following? ?General activity 6 ?Relation with others 5 ?Enjoyment of life 5 ?What TIME of day is your pain at its worst? morning , daytime, evening, and night ?Sleep (in general) Fair ? ?Pain is worse with: walking, bending, sitting, and standing ?Pain improves with: medication ?Relief from Meds: 4 ? ?Family History  ?Problem Relation Age of Onset  ? Heart disease Mother   ? Colon polyps Mother   ? Coronary artery disease Mother   ? Aortic stenosis Mother   ? Kidney failure Mother   ? Obesity Mother   ? Lung cancer Father   ?     lung  ? Cancer Father   ? Alcoholism Father   ? Obesity Father   ? Hypertension Sister   ? Hypertension  Brother   ? Sarcoidosis Brother   ? Other Brother   ?     heart valve issues  ? ?Social History  ? ?Socioeconomic History  ? Marital status: Widowed  ?  Spouse name: Not on file  ? Number of children: 2  ? Years of education: 62  ? Highest education level: Not on file  ?Occupational History  ? Occupation: disabled   ?  Employer: [employer]  ?Tobacco Use  ? Smoking status: Former  ?  Packs/day: 1.00  ?  Years: 38.00  ?  Pack years: 38.00  ?  Types: Cigarettes  ?  Quit date: 08/01/2013  ?  Years since quitting: 7.8  ? Smokeless tobacco: Never  ?Vaping Use  ? Vaping Use: Never used  ?Substance and Sexual Activity  ? Alcohol use: No  ? Drug use: No  ? Sexual activity: Not on file  ?Other Topics Concern  ? Not on file  ?Social History Narrative  ? Patient is widowed and her son and grandson live with her.  ? Patient is disabled.  ? Patient has a high school education.  ? Patient drinks 3 glasses of caffeine daily.  ? Patient is right-handed.  ? Patient has two children.  ? ?Social Determinants of Health  ? ?Financial Resource Strain: Not on file  ?Food Insecurity: Not on file  ?Transportation Needs: Not on file  ?  Physical Activity: Not on file  ?Stress: Not on file  ?Social Connections: Not on file  ? ?Past Surgical History:  ?Procedure Laterality Date  ? ABDOMINAL HYSTERECTOMY    ? complete  ? APPENDECTOMY    ? BIOPSY  08/20/2017  ? Procedure: BIOPSY;  Surgeon: Ronnette Juniper, MD;  Location: [facility];  Service: Gastroenterology;;  ? CARDIAC CATHETERIZATION  10/01/2004  ? "normal coronary arteries";  states she sees Dr Tanna Furry cardiology when needed, reports lov with him was "several years ago" and at the time the had me " wlak around the office several times to check my breathing "  ? CARPAL TUNNEL RELEASE Right 01/20/2013  ? Procedure: RIGHT CARPAL TUNNEL RELEASE;  Surgeon: Cammie Sickle., MD;  Location: [place];  Service: Orthopedics;  Laterality: Right;  ? CARPAL TUNNEL RELEASE Left  02/10/2013  ? Procedure: LEFT CARPAL TUNNEL RELEASE;  Surgeon: Cammie Sickle., MD;  Location: [place];  Service: Orthopedics;  Laterality: Left;  ? CHOLECYSTECTOMY    ? COLONOSCOPY  01/2007  ? CYSTOSCOPY WITH RETROGRADE PYELOGRAM, URETEROSCOPY AND STENT PLACEMENT  05/25/2009  ? and stone extraction  ? ESOPHAGOGASTRODUODENOSCOPY (EGD) WITH PROPOFOL N/A 08/20/2017  ? Procedure: ESOPHAGOGASTRODUODENOSCOPY (EGD) WITH PROPOFOL;  Surgeon: Ronnette Juniper, MD;  [facility]: [facility];  Service: Gastroenterology;  Laterality: N/A;  ? ESOPHAGOGASTRODUODENOSCOPY (EGD) WITH PROPOFOL N/A 05/31/2020  ? Procedure: ESOPHAGOGASTRODUODENOSCOPY (EGD) WITH PROPOFOL;  Surgeon: Ronnette Juniper, MD;  [facility]: [facility];  Service: Gastroenterology;  Laterality: N/A;  ? FEMUR IM NAIL Left 04/23/2018  ? Procedure: INTRAMEDULLARY (IM) NAIL FEMORAL;  Surgeon: Renette Butters, MD;  Location: [place];  Service: Orthopedics;  Laterality: Left;  ? FIBULAR SESAMOID EXCISION Left 03/30/2001  ? HARDWARE REMOVAL Right 08/03/2018  ? Procedure: HARDWARE REMOVAL, ORIF REVISION;  Surgeon: [name], MD;  [facility];  Service: Orthopedics;  Laterality: Right;  ? ORIF HUMERUS FRACTURE Right 04/24/2018  ? Procedure: OPEN REDUCTION INTERNAL FIXATION (ORIF) PROXIMAL HUMERUS FRACTURE;  Surgeon: [name], MD;  Location: [place];  Service: Orthopedics;  Laterality: Right;  ? SPINE SURGERY    ? TOENAIL EXCISION Left 03/30/2001  ? partial exc. great toenail  ? TRIGGER FINGER RELEASE Right 01/20/2013  ? Procedure: RELEASE RIGHT THUMB A-1 PULLEY;  Surgeon: Cammie Sickle., MD;  Location: [place];  Service: Orthopedics;  Laterality: Right;  ? TUBAL LIGATION    ? ?Past Surgical History:  ?Procedure Laterality Date  ? ABDOMINAL HYSTERECTOMY    ? complete  ? APPENDECTOMY    ? BIOPSY  08/20/2017  ? Procedure: BIOPSY;  Surgeon: Ronnette Juniper, MD;  [facility];  Service: Gastroenterology;;  ? CARDIAC CATHETERIZATION   10/01/2004  ? "normal coronary arteries";  states she sees Dr Tanna Furry cardiology when needed, reports lov with him was "several years ago" and at the time the had me " wlak around the office several times to check my breathing "  ? CARPAL TUNNEL RELEASE Right 01/20/2013  ? Procedure: RIGHT CARPAL TUNNEL RELEASE;  Surgeon: Cammie Sickle., MD;  Location: [place];  Service: Orthopedics;  Laterality: Right;  ? CARPAL TUNNEL RELEASE Left 02/10/2013  ? Procedure: LEFT CARPAL TUNNEL RELEASE;  Surgeon: Cammie Sickle., MD;  Location: [place];  Service: Orthopedics;  Laterality: Left;  ? CHOLECYSTECTOMY    ? COLONOSCOPY  01/2007  ? CYSTOSCOPY WITH RETROGRADE PYELOGRAM, URETEROSCOPY AND STENT PLACEMENT  05/25/2009  ?  and stone extraction  ? ESOPHAGOGASTRODUODENOSCOPY (EGD) WITH PROPOFOL N/A 08/20/2017  ? Procedure: ESOPHAGOGASTRODUODENOSCOPY (EGD) WITH PROPOFOL;  Surgeon: Ronnette Juniper, MD;  [facility];  Service: Gastroenterology;  Laterality: N/A;  ? ESOPHAGOGASTRODUODENOSCOPY (EGD) WITH PROPOFOL N/A 05/31/2020  ? Procedure: ESOPHAGOGASTRODUODENOSCOPY (EGD) WITH PROPOFOL;  Surgeon: Ronnette Juniper, MD;  [facility];  Service: Gastroenterology;  Laterality: N/A;  ? FEMUR IM NAIL Left 04/23/2018  ? Procedure: INTRAMEDULLARY (IM) NAIL FEMORAL;  Surgeon: Renette Butters, MD;  Location: [place];  Service: Orthopedics;  Laterality: Left;  ? FIBULAR SESAMOID EXCISION Left 03/30/2001  ? HARDWARE REMOVAL Right 08/03/2018  ? Procedure: HARDWARE REMOVAL, ORIF REVISION;  Surgeon: [name], MD;  [facility];  Service: Orthopedics;  Laterality: Right;  ? ORIF HUMERUS FRACTURE Right 04/24/2018  ? Procedure: OPEN REDUCTION INTERNAL FIXATION (ORIF) PROXIMAL HUMERUS FRACTURE;  Surgeon: [name], MD;  Location: [place];  Service: Orthopedics;  Laterality: Right;  ? SPINE SURGERY    ? TOENAIL EXCISION Left 03/30/2001  ? partial exc. great toenail  ? TRIGGER FINGER RELEASE  Right 01/20/2013  ? Procedure: RELEASE RIGHT THUMB A-1 PULLEY;  Surgeon: Cammie Sickle., MD;  Location: [place];  Service: Orthopedics;  Laterality: Right;  ? TUBAL LIGATION    ? ?P

## 2021-05-27 ENCOUNTER — Ambulatory Visit (INDEPENDENT_AMBULATORY_CARE_PROVIDER_SITE_OTHER): Payer: Medicare Other | Admitting: Family Medicine

## 2021-05-27 ENCOUNTER — Encounter (INDEPENDENT_AMBULATORY_CARE_PROVIDER_SITE_OTHER): Payer: Self-pay | Admitting: Family Medicine

## 2021-05-27 VITALS — BP 148/93 | HR 78 | Temp 97.9°F | Ht 66.0 in | Wt 278.0 lb

## 2021-05-27 DIAGNOSIS — Z6841 Body Mass Index (BMI) 40.0 and over, adult: Secondary | ICD-10-CM

## 2021-05-27 DIAGNOSIS — R635 Abnormal weight gain: Secondary | ICD-10-CM

## 2021-05-27 DIAGNOSIS — R109 Unspecified abdominal pain: Secondary | ICD-10-CM | POA: Diagnosis not present

## 2021-05-27 DIAGNOSIS — E669 Obesity, unspecified: Secondary | ICD-10-CM

## 2021-05-27 DIAGNOSIS — E1169 Type 2 diabetes mellitus with other specified complication: Secondary | ICD-10-CM

## 2021-05-27 DIAGNOSIS — Z7985 Long-term (current) use of injectable non-insulin antidiabetic drugs: Secondary | ICD-10-CM

## 2021-05-27 DIAGNOSIS — F419 Anxiety disorder, unspecified: Secondary | ICD-10-CM

## 2021-05-27 DIAGNOSIS — Z794 Long term (current) use of insulin: Secondary | ICD-10-CM

## 2021-05-28 ENCOUNTER — Encounter (INDEPENDENT_AMBULATORY_CARE_PROVIDER_SITE_OTHER): Payer: Self-pay | Admitting: Family Medicine

## 2021-05-28 DIAGNOSIS — Z794 Long term (current) use of insulin: Secondary | ICD-10-CM | POA: Diagnosis not present

## 2021-05-28 DIAGNOSIS — E1165 Type 2 diabetes mellitus with hyperglycemia: Secondary | ICD-10-CM | POA: Diagnosis not present

## 2021-05-28 DIAGNOSIS — R300 Dysuria: Secondary | ICD-10-CM | POA: Diagnosis not present

## 2021-05-28 DIAGNOSIS — M810 Age-related osteoporosis without current pathological fracture: Secondary | ICD-10-CM | POA: Diagnosis not present

## 2021-05-28 LAB — CBC WITH DIFFERENTIAL/PLATELET
Basophils Absolute: 0 10*3/uL (ref 0.0–0.2)
Basos: 0 %
EOS (ABSOLUTE): 0.1 10*3/uL (ref 0.0–0.4)
Eos: 2 %
Hematocrit: 43.7 % (ref 34.0–46.6)
Hemoglobin: 14.2 g/dL (ref 11.1–15.9)
Immature Grans (Abs): 0 10*3/uL (ref 0.0–0.1)
Immature Granulocytes: 0 %
Lymphocytes Absolute: 0.9 10*3/uL (ref 0.7–3.1)
Lymphs: 15 %
MCH: 26.3 pg — ABNORMAL LOW (ref 26.6–33.0)
MCHC: 32.5 g/dL (ref 31.5–35.7)
MCV: 81 fL (ref 79–97)
Monocytes Absolute: 0.5 10*3/uL (ref 0.1–0.9)
Monocytes: 8 %
Neutrophils Absolute: 4.4 10*3/uL (ref 1.4–7.0)
Neutrophils: 75 %
Platelets: 132 10*3/uL — ABNORMAL LOW (ref 150–450)
RBC: 5.4 x10E6/uL — ABNORMAL HIGH (ref 3.77–5.28)
RDW: 14.2 % (ref 11.7–15.4)
WBC: 5.9 10*3/uL (ref 3.4–10.8)

## 2021-05-28 LAB — COMPREHENSIVE METABOLIC PANEL
ALT: 48 IU/L — ABNORMAL HIGH (ref 0–32)
AST: 56 IU/L — ABNORMAL HIGH (ref 0–40)
Albumin/Globulin Ratio: 1.1 — ABNORMAL LOW (ref 1.2–2.2)
Albumin: 3.7 g/dL — ABNORMAL LOW (ref 3.8–4.8)
Alkaline Phosphatase: 65 IU/L (ref 44–121)
BUN/Creatinine Ratio: 32 — ABNORMAL HIGH (ref 12–28)
BUN: 30 mg/dL — ABNORMAL HIGH (ref 8–27)
Bilirubin Total: 0.4 mg/dL (ref 0.0–1.2)
CO2: 24 mmol/L (ref 20–29)
Calcium: 9.3 mg/dL (ref 8.7–10.3)
Chloride: 99 mmol/L (ref 96–106)
Creatinine, Ser: 0.94 mg/dL (ref 0.57–1.00)
Globulin, Total: 3.4 g/dL (ref 1.5–4.5)
Glucose: 133 mg/dL — ABNORMAL HIGH (ref 70–99)
Potassium: 3.6 mmol/L (ref 3.5–5.2)
Sodium: 137 mmol/L (ref 134–144)
Total Protein: 7.1 g/dL (ref 6.0–8.5)
eGFR: 67 mL/min/{1.73_m2} (ref >59)

## 2021-05-28 LAB — BRAIN NATRIURETIC PEPTIDE: BNP: 53.8 pg/mL (ref 0.0–100.0)

## 2021-05-28 NOTE — Progress Notes (Signed)
Chief Complaint:   OBESITY Katherine [name] is here to discuss her progress with her obesity treatment plan along with follow-up of her obesity related diagnoses. See Medical Weight Management Flowsheet for complete bioelectrical impedance results.  Today's visit was #: 23 Starting weight: 293 lbs Starting date: 10/13/2019 Weight change since last visit: 4 lbs Total lbs lost to date: 22 lbs Total weight loss percentage to date: -7.51%  Nutrition Plan: Practicing portion control and making smarter food choices, such as increasing vegetables and decreasing simple carbohydrates for 15% of the time. Activity: None. Anti-obesity medications: Ozempic 1 mg subcutaneously weekly. Reported side effects: None.  Interim History: Katherine [name] is having diverticulitis symptoms again including lower abdominal pain, increased IBS, and increased constipation.  Assessment/Plan:   1. Diverticulitis Katherine [name] is scheduled for a colonoscopy next month.  Plan:  Start Levaquin 500 mg daily for 7 days and Flagyl 500 mg three times daily for 7 days.  - Start levofloxacin (LEVAQUIN) 500 MG tablet; Take 1 tablet (500 mg total) by mouth daily for 7 days.  Dispense: 7 tablet; Refill: 0 - Start metroNIDAZOLE (FLAGYL) 500 MG tablet; Take 1 tablet (500 mg total) by mouth 3 (three) times daily for 7 days.  Dispense: 21 tablet; Refill: 0  2. IDDM BS elevated to 300 last night (consistent with previous diverticulitis).  Plan:  Will apply Dexcom today.  Refill Ozempic 1 mg subcutaneously weekly.  - Refill Semaglutide, 1 MG/DOSE, (OZEMPIC, 1 MG/DOSE,) 4 MG/3ML SOPN; Inject 1 mg into the skin once a week.  Dispense: 6 mL; Refill: 0  3. Hypertension associated with type 2 diabetes mellitus (HCC) Elevated today. Medications: HCTZ 12.5 mg daily and Bystolic 10 mg daily.   Plan: Avoid buying foods that are: processed, frozen, or prepackaged to avoid excess salt. We will watch for signs of hypotension as she continues  lifestyle modifications.  BP Readings from Last 3 Encounters:  05/27/21 (!) 148/93  05/23/21 122/79  05/16/21 111/79   Lab Results  Component Value Date   CREATININE 1.04 (H) 01/29/2021   4. Chronic constipation Katherine [name] is taking Linzess 145 mcg daily.  Plan:  Reviewed bowel regimen.  Will refill Linzess 145 mcg daily.  She is scheduled for a colonoscopy on 4/12.  - Refill linaclotide (LINZESS) 145 MCG CAPS capsule; Take 1 capsule (145 mcg total) by mouth daily before breakfast.  Dispense: 90 capsule; Refill: 0  5. Obesity, BMI today is 43.8  Course: Katherine [name] is currently in the action stage of change. As such, her goal is to continue with weight loss efforts.   Nutrition goals: She has agreed to practicing portion control and making smarter food choices, such as increasing vegetables and decreasing simple carbohydrates.   Exercise goals:  As is.  Behavioral modification strategies: increasing lean protein intake, decreasing simple carbohydrates, increasing vegetables, and increasing water intake.  Katherine [name] has agreed to follow-up with our clinic in 2 weeks. She was informed of the importance of frequent follow-up visits to maximize her success with intensive lifestyle modifications for her multiple health conditions.   Objective:   Blood pressure 111/79, pulse 71, temperature 97.6 F (36.4 C), height 5' 6"  (1.676 m), weight 271 lb (122.9 kg), SpO2 94 %. Body mass index is 43.74 kg/m.  General: Cooperative, alert, well developed, in no acute distress. HEENT: Conjunctivae and lids unremarkable. Cardiovascular: Regular rhythm.  Lungs: Normal work of breathing. Neurologic: No focal deficits.   Lab Results  Component Value Date   CREATININE 1.04 (H) 01/29/2021  BUN 21 01/29/2021   NA 144 01/29/2021   K 4.1 01/29/2021   CL 103 01/29/2021   CO2 27 01/29/2021   Lab Results  Component Value Date   ALT 28 01/29/2021   AST 44 (H) 01/29/2021   ALKPHOS 84 01/29/2021    BILITOT 0.4 01/29/2021   Lab Results  Component Value Date   HGBA1C 5.9 03/12/2020   HGBA1C 7.3 09/22/2019   HGBA1C 6.5 (H) 07/28/2018   HGBA1C 10.1 (H) 09/08/2012   HGBA1C 10.1 (H) 09/07/2012   Lab Results  Component Value Date   TSH 1.750 01/29/2021   Lab Results  Component Value Date   CHOL 95 12/13/2019   HDL 44 12/13/2019   LDLCALC 29 12/13/2019   TRIG 123 12/13/2019   CHOLHDL 2.8 08/19/2017   Lab Results  Component Value Date   WBC 3.7 01/29/2021   HGB 13.1 01/29/2021   HCT 40.6 01/29/2021   MCV 81 01/29/2021   PLT 99 (LL) 01/29/2021   Lab Results  Component Value Date   IRON 39 08/19/2017   TIBC 404 08/19/2017   Attestation Statements:   Reviewed by clinician on day of visit: allergies, medications, problem list, medical history, surgical history, family history, social history, and previous encounter notes.  I, Water quality scientist, CMA, am acting as transcriptionist for Briscoe Deutscher, DO  I have reviewed the above documentation for accuracy and completeness, and I agree with the above. -  Briscoe Deutscher, DO, MS, FAAFP, DABOM - Family and Bariatric Medicine.

## 2021-05-29 ENCOUNTER — Other Ambulatory Visit (INDEPENDENT_AMBULATORY_CARE_PROVIDER_SITE_OTHER): Payer: Self-pay | Admitting: Family Medicine

## 2021-05-29 DIAGNOSIS — E038 Other specified hypothyroidism: Secondary | ICD-10-CM

## 2021-05-29 NOTE — Telephone Encounter (Signed)
Dr.Wallace °

## 2021-05-30 ENCOUNTER — Telehealth: Payer: Self-pay | Admitting: *Deleted

## 2021-05-30 LAB — TOXASSURE SELECT,+ANTIDEPR,UR

## 2021-05-30 NOTE — Telephone Encounter (Signed)
Urine drug screen for this encounter is consistent for prescribed medication 

## 2021-05-31 NOTE — Progress Notes (Signed)
Chief Complaint:   OBESITY Katherine [name] is here to discuss her progress with her obesity treatment plan along with follow-up of her obesity related diagnoses. See Medical Weight Management Flowsheet for complete bioelectrical impedance results.  Today's visit was #: 24 Starting weight: 293 lbs Starting date: 10/13/2019 Weight change since last visit: +7 lbs Total lbs lost to date: 15 lbs Total weight loss percentage to date: -5.12%  Nutrition Plan: Practicing portion control and making smarter food choices, such as increasing vegetables and decreasing simple carbohydrates for 0% of the time. Activity: None. Anti-obesity medications: Ozempic 1 mg subcutaneously weekly. Reported side effects: None.   Interim History: Katherine [name] reports that the CGM was helpful to wear. She is still having stomach issues and will inform Dr. Therisa Doyne.  Assessment/Plan:   1. IDDM Diabetes Mellitus: Controlled. Medication:  Ozempic 1 mg subcutaneously weekly, [name] 50 units daily, Farxiga 5 mg daily. Issues reviewed: blood sugar goals, complications of diabetes mellitus, hypoglycemia prevention and treatment, exercise, and nutrition. 10 day DEXCOM was reviewed and her average glucose was 189.  Plan: The patient will continue to focus on protein-rich, low simple carbohydrate foods. We reviewed the importance of hydration, regular exercise for stress reduction, and restorative sleep.   Lab Results  Component Value Date   HGBA1C 5.9 03/12/2020   HGBA1C 7.3 09/22/2019   HGBA1C 6.5 (H) 07/28/2018   Lab Results  Component Value Date   MICROALBUR 2.86 04/19/2020   LDLCALC 29 12/13/2019   CREATININE 0.94 05/27/2021   2. Abdominal pain Katherine [name] was treated for presumed diverticulitis at her last visit. She is still experiencing pain and constipation. She has a colonoscopy scheduled for April.  3. Abnormal weight gain We will continue to monitor symptoms as it relates to her weight loss journey.  Plan:  We will check labs today.  - Brain natriuretic peptide - CBC with Differential/Platelet - Comprehensive metabolic panel  4. Anxiety Katherine [name] had a panic attack today.  Behavior modification techniques were discussed today to help Katherine [name] deal with her anxiety.    5. Obesity, BMI today is 44.89 Course: Katherine [name] is currently in the action stage of change. As such, her goal is to continue with weight loss efforts.   Nutrition goals: She has agreed to practicing portion control and making smarter food choices, such as increasing vegetables and decreasing simple carbohydrates.   Exercise goals: Katherine [name] should follow the adult guidelines. When Katherine [name] cannot meet the adult guidelines, they should be as physically active as their abilities and conditions will allow.   Behavioral modification strategies: increasing lean protein intake, increasing water intake, and decreasing sodium intake.  Katherine [name] has agreed to follow-up with our clinic in 3 weeks. She was informed of the importance of frequent follow-up visits to maximize her success with intensive lifestyle modifications for her multiple health conditions.   Katherine [name] was informed we would discuss her lab results at her next visit unless there is a critical issue that needs to be addressed sooner. Katherine [name] agreed to keep her next visit at the agreed upon time to discuss these results.  Objective:   Blood pressure (!) 148/93, pulse 78, temperature 97.9 F (36.6 C), temperature source Oral, height 5' 6"  (1.676 m), weight 278 lb (126.1 kg), SpO2 94 %. Body mass index is 44.87 kg/m.  General: Cooperative, alert, well developed, in no acute distress. HEENT: Conjunctivae and lids unremarkable. Cardiovascular: Regular rhythm.  Lungs: Normal work of breathing. Neurologic: No focal deficits.   Lab Results  Component Value Date   HGBA1C 5.9 03/12/2020   HGBA1C 7.3 09/22/2019   HGBA1C 6.5 (H) 07/28/2018   HGBA1C 10.1 (H)  09/08/2012   HGBA1C 10.1 (H) 09/07/2012   Lab Results  Component Value Date   TSH 1.750 01/29/2021   Lab Results  Component Value Date   CHOL 95 12/13/2019   HDL 44 12/13/2019   LDLCALC 29 12/13/2019   TRIG 123 12/13/2019   CHOLHDL 2.8 08/19/2017   Lab Results  Component Value Date   IRON 39 08/19/2017   TIBC 404 08/19/2017   Attestation Statements:   Reviewed by clinician on day of visit: allergies, medications, problem list, medical history, surgical history, family history, social history, and previous encounter notes.  Time spent on visit including pre-visit chart review and post-visit care and documentation was 43 minutes.Time was spent on: I performed a medically necessary appropriate examination and/or evaluation. I discussed the assessment and treatment plan with the patient. Motivational interviewing as well as evidence-based interventions for health behavior change were utilized today including the discussion of self monitoring techniques, problem-solving barriers and SMART goal setting techniques.  The patient was provided an opportunity to ask questions and all were answered. The patient agreed with the plan and demonstrated an understanding of the instructions. Labs were ordered at this visit and will be reviewed at the next visit unless more critical results need to be addressed immediately. Clinical information was updated and documented in the EMR.  Leodis Binet Friedenbach, CMA, am acting as Location manager for [employer], DO.  I have reviewed the above documentation for accuracy and completeness, and I agree with the above. -  Briscoe Deutscher, DO, MS, FAAFP, DABOM - Family and Bariatric Medicine.

## 2021-06-02 ENCOUNTER — Encounter: Payer: Self-pay | Admitting: Registered Nurse

## 2021-06-12 DIAGNOSIS — R933 Abnormal findings on diagnostic imaging of other parts of digestive tract: Secondary | ICD-10-CM | POA: Diagnosis not present

## 2021-06-12 DIAGNOSIS — K5520 Angiodysplasia of colon without hemorrhage: Secondary | ICD-10-CM | POA: Diagnosis not present

## 2021-06-12 DIAGNOSIS — R1032 Left lower quadrant pain: Secondary | ICD-10-CM | POA: Diagnosis not present

## 2021-06-25 ENCOUNTER — Telehealth: Payer: Self-pay

## 2021-06-25 MED ORDER — HYDROCODONE-ACETAMINOPHEN 10-325 MG PO TABS
1.0000 | ORAL_TABLET | Freq: Three times a day (TID) | ORAL | 0 refills | Status: DC | PRN
Start: 2021-06-25 — End: 2021-07-02

## 2021-06-25 NOTE — Telephone Encounter (Signed)
Return Katherine [name] call, she states the oxycodone was making her dizzy when she  tried to take it 4 times a day as needed for pain. She resume taking the oxycodone  three times a day, she would like to try another medication. Her PMP was Reviewed for the last 5 years, allergy list was reviewed.  ?We will start a trial of Hydrocodone for a week, she will call office in a week. We will begin Hydrocodone on 06/26/2021, she verbalizes understanding.  ?Katherine [name] is aware her Oxycodone is being held, she verbalizes understanding. Today she has 21 tablets of her oxycodone, that will be locked up, she verbalizes understanding.  ?

## 2021-06-25 NOTE — Telephone Encounter (Signed)
Patient called stating that the new dosage of Oxycodone one tablet four times is not agreeing with her. She states it makes her head feel funny. She would like to go back to three times a day and needs a prescription sent in. Next appt 07/22/21. She asked if its something else she can do or take along with the Oxycodone. ?

## 2021-06-28 ENCOUNTER — Telehealth: Payer: Self-pay | Admitting: *Deleted

## 2021-06-28 NOTE — Telephone Encounter (Signed)
Katherine [name] is calling about her medication like [name] asked her to and she would like to talk with her. ?

## 2021-06-28 NOTE — Telephone Encounter (Signed)
Return Ms. Elsea call,  ?She states she will continue with the Hydrocodone at this time, she will call on Monday with a update.  ? ?

## 2021-07-01 ENCOUNTER — Other Ambulatory Visit: Payer: Self-pay | Admitting: Registered Nurse

## 2021-07-01 DIAGNOSIS — G5712 Meralgia paresthetica, left lower limb: Secondary | ICD-10-CM

## 2021-07-01 DIAGNOSIS — M170 Bilateral primary osteoarthritis of knee: Secondary | ICD-10-CM

## 2021-07-01 DIAGNOSIS — M19172 Post-traumatic osteoarthritis, left ankle and foot: Secondary | ICD-10-CM

## 2021-07-02 ENCOUNTER — Telehealth: Payer: Self-pay | Admitting: Registered Nurse

## 2021-07-02 MED ORDER — OXYCODONE HCL 10 MG PO TABS: 10.0000 mg | ORAL_TABLET | Freq: Three times a day (TID) | ORAL | 0 refills | Status: DC | PRN

## 2021-07-02 NOTE — Telephone Encounter (Signed)
Hydrocodone discontinued: Ineffective. ?PMP was Reviewed.  ?Oxycodone e-scribed today.  ?Ms. Corralejo is aware via My-Chart message.  ?

## 2021-07-10 DIAGNOSIS — R0789 Other chest pain: Secondary | ICD-10-CM | POA: Diagnosis not present

## 2021-07-10 DIAGNOSIS — E118 Type 2 diabetes mellitus with unspecified complications: Secondary | ICD-10-CM | POA: Diagnosis not present

## 2021-07-10 DIAGNOSIS — K5900 Constipation, unspecified: Secondary | ICD-10-CM | POA: Diagnosis not present

## 2021-07-19 ENCOUNTER — Ambulatory Visit (INDEPENDENT_AMBULATORY_CARE_PROVIDER_SITE_OTHER): Payer: Medicare Other | Admitting: Internal Medicine

## 2021-07-19 ENCOUNTER — Encounter: Payer: Self-pay | Admitting: Internal Medicine

## 2021-07-19 VITALS — BP 154/82 | HR 80 | Ht 66.0 in | Wt 296.2 lb

## 2021-07-19 DIAGNOSIS — R0609 Other forms of dyspnea: Secondary | ICD-10-CM | POA: Diagnosis not present

## 2021-07-19 DIAGNOSIS — R079 Chest pain, unspecified: Secondary | ICD-10-CM

## 2021-07-19 NOTE — Patient Instructions (Signed)
Medication Instructions:  No changes   Lab Work:  Not needed   Testing/Procedures: Will be schedule at [facility] has requested that you have en exercise stress myoview ( myocardial perfusion  )Please follow instruction sheet, as given.    Follow-Up: At [facility], you and your health needs are our priority.  As part of our continuing mission to provide you with exceptional heart care, we have created designated Provider Care Teams.  These Care Teams include your primary Cardiologist (physician) and Advanced Practice Providers (APPs -  Physician Assistants and Nurse Practitioners) who all work together to provide you with the care you need, when you need it.     Your next appointment:   As needed     The format for your next appointment:   In Person  Provider:   Janina Mayo, MD    Other Instructions    Your doctor has scheduled you for a Myocardial Perfusion scan on to obtain information about the blood flow to your heart. The test consists of taking pictures of your heart in two phases: while resting and after a stress test.  The stress test may involve walking on a treadmill, or if you are unable to exercise adequately, you will be given a drug intended to have a similar effect on the heart to that of exercise.  The test will take approximately 3 to 4  hours to complete.  If you are pregnant or breastfeeding,  please notify the staff prior to your test.  How to prepare for your test: Do not eat or drink 2 hours prior to your test Do not consume products containing caffeine 12 hours prior to your test (examples: coffee (regular OR decaf), chocolate, sodas, tea) Your doctor may need you to hold certain medications prior to the test.  If so, these are listed below and should not be taken for 24 hours prior to the test.  If not listed below, you may take your medications as normal.  You may resume taking held medications on your normal  schedule once the test is complete.   Meds to hold: none Do bring a list of your current medications with you.  If you have held any meds in preparation for the test, please bring them, as you may be required to take them once the test is completed. Do wear comfortable clothes and walking shoes.  Do not wear dresses or overalls. Do NOT wear cologne, perfume, aftershave, or fragranced lotions the day of your test (deodorants okay). If these instructions are not followed your test will have to be rescheduled.   A nuclear cardiologist will review your test, prepare a report and send it to your physician.   If you have questions or concerns about your appointment, you can call the Nuclear Cardiology department at [phone]. If you cannot keep your appointment, please provide 48 hours notification to avoid a possible $50.00 charge to your account.   Please arrive 15 minutes prior to your appointment time for registration and insurance purposes
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
## 2021-07-22 ENCOUNTER — Telehealth (HOSPITAL_COMMUNITY): Payer: Self-pay | Admitting: *Deleted

## 2021-07-22 ENCOUNTER — Encounter: Payer: Medicare Other | Attending: Registered Nurse | Admitting: Registered Nurse

## 2021-07-22 VITALS — BP 110/78 | HR 81 | Ht 66.0 in | Wt 290.4 lb

## 2021-07-22 DIAGNOSIS — M25511 Pain in right shoulder: Secondary | ICD-10-CM | POA: Insufficient documentation

## 2021-07-22 DIAGNOSIS — M47816 Spondylosis without myelopathy or radiculopathy, lumbar region: Secondary | ICD-10-CM | POA: Insufficient documentation

## 2021-07-22 DIAGNOSIS — S8412XS Injury of peroneal nerve at lower leg level, left leg, sequela: Secondary | ICD-10-CM | POA: Insufficient documentation

## 2021-07-22 DIAGNOSIS — G8929 Other chronic pain: Secondary | ICD-10-CM | POA: Insufficient documentation

## 2021-07-22 DIAGNOSIS — Z79891 Long term (current) use of opiate analgesic: Secondary | ICD-10-CM | POA: Diagnosis not present

## 2021-07-22 DIAGNOSIS — M1711 Unilateral primary osteoarthritis, right knee: Secondary | ICD-10-CM | POA: Insufficient documentation

## 2021-07-22 DIAGNOSIS — R202 Paresthesia of skin: Secondary | ICD-10-CM | POA: Insufficient documentation

## 2021-07-22 DIAGNOSIS — M1712 Unilateral primary osteoarthritis, left knee: Secondary | ICD-10-CM | POA: Diagnosis not present

## 2021-07-22 DIAGNOSIS — G894 Chronic pain syndrome: Secondary | ICD-10-CM | POA: Diagnosis not present

## 2021-07-22 DIAGNOSIS — M25512 Pain in left shoulder: Secondary | ICD-10-CM | POA: Diagnosis not present

## 2021-07-22 DIAGNOSIS — Z5181 Encounter for therapeutic drug level monitoring: Secondary | ICD-10-CM | POA: Insufficient documentation

## 2021-07-22 MED ORDER — OXYCODONE HCL 10 MG PO TABS: 10.0000 mg | ORAL_TABLET | Freq: Three times a day (TID) | ORAL | 0 refills | Status: DC | PRN

## 2021-07-22 NOTE — Telephone Encounter (Signed)
Left message on voicemail per DPR in reference to upcoming appointment scheduled on  07/25/21 with detailed instructions given per Myocardial Perfusion Study Information Sheet for the test. LM to arrive 15 minutes early, and that it is imperative to arrive on time for appointment to keep from having the test rescheduled. If you need to cancel or reschedule your appointment, please call the office within 24 hours of your appointment. Failure to do so may result in a cancellation of your appointment, and a $50 no show fee. Phone number given for call back for any questions. Kirstie Peri, RN
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
## 2021-07-23 ENCOUNTER — Other Ambulatory Visit: Payer: Self-pay | Admitting: Registered Nurse

## 2021-07-23 DIAGNOSIS — G894 Chronic pain syndrome: Secondary | ICD-10-CM

## 2021-07-23 DIAGNOSIS — M47816 Spondylosis without myelopathy or radiculopathy, lumbar region: Secondary | ICD-10-CM

## 2021-07-25 ENCOUNTER — Other Ambulatory Visit: Payer: Self-pay | Admitting: *Deleted

## 2021-07-25 ENCOUNTER — Ambulatory Visit (HOSPITAL_COMMUNITY): Payer: Medicare Other | Attending: Cardiology

## 2021-07-25 ENCOUNTER — Other Ambulatory Visit: Payer: Self-pay | Admitting: Cardiology

## 2021-07-25 DIAGNOSIS — R079 Chest pain, unspecified: Secondary | ICD-10-CM

## 2021-07-25 DIAGNOSIS — R0609 Other forms of dyspnea: Secondary | ICD-10-CM | POA: Diagnosis not present

## 2021-07-25 MED ORDER — TECHNETIUM TC 99M TETROFOSMIN IV KIT
32.1000 | PACK | Freq: Once | INTRAVENOUS | Status: AC | PRN
  Administered 2021-07-25: 32.1 via INTRAVENOUS

## 2021-07-25 MED ORDER — REGADENOSON 0.4 MG/5ML IV SOLN
0.4000 mg | Freq: Once | INTRAVENOUS | Status: AC
  Administered 2021-07-25: 0.4 mg via INTRAVENOUS

## 2021-07-26 ENCOUNTER — Ambulatory Visit (HOSPITAL_COMMUNITY): Payer: Medicare Other | Attending: Cardiovascular Disease

## 2021-07-26 LAB — MYOCARDIAL PERFUSION IMAGING
Base ST Depression (mm): 0 mm
LV dias vol: 69 mL (ref 46–106)
LV sys vol: 29 mL
Nuc Stress EF: 58 %
Peak HR: 115 {beats}/min
Rest HR: 93 {beats}/min
Rest Nuclear Isotope Dose: 32.4 mCi
SDS: 1
SRS: 0
SSS: 1
ST Depression (mm): 0 mm
Stress Nuclear Isotope Dose: 32.1 mCi
TID: 1

## 2021-07-26 MED ORDER — TECHNETIUM TC 99M TETROFOSMIN IV KIT
32.4000 | PACK | Freq: Once | INTRAVENOUS | Status: AC | PRN
  Administered 2021-07-26: 32.4 via INTRAVENOUS

## 2021-07-28 ENCOUNTER — Encounter: Payer: Self-pay | Admitting: Registered Nurse

## 2021-07-30 DIAGNOSIS — Z1231 Encounter for screening mammogram for malignant neoplasm of breast: Secondary | ICD-10-CM | POA: Diagnosis not present

## 2021-07-30 DIAGNOSIS — Z8262 Family history of osteoporosis: Secondary | ICD-10-CM | POA: Diagnosis not present

## 2021-07-30 DIAGNOSIS — Z13820 Encounter for screening for osteoporosis: Secondary | ICD-10-CM | POA: Diagnosis not present

## 2021-08-01 DIAGNOSIS — K5900 Constipation, unspecified: Secondary | ICD-10-CM | POA: Diagnosis not present

## 2021-08-01 DIAGNOSIS — E1165 Type 2 diabetes mellitus with hyperglycemia: Secondary | ICD-10-CM | POA: Diagnosis not present

## 2021-08-12 ENCOUNTER — Other Ambulatory Visit: Payer: Self-pay | Admitting: Registered Nurse

## 2021-08-12 DIAGNOSIS — M7121 Synovial cyst of popliteal space [Baker], right knee: Secondary | ICD-10-CM

## 2021-08-12 DIAGNOSIS — S8412XS Injury of peroneal nerve at lower leg level, left leg, sequela: Secondary | ICD-10-CM

## 2021-08-22 ENCOUNTER — Telehealth: Payer: Self-pay | Admitting: Registered Nurse

## 2021-08-22 NOTE — Telephone Encounter (Signed)
Placed a call to [name],  She is not taking the diclofenac, she is only using Voltaren gel.  Diclofenac has been removed from her medication list.

## 2021-09-02 ENCOUNTER — Telehealth: Payer: Self-pay

## 2021-09-02 NOTE — Telephone Encounter (Signed)
PMP was Reviewed.  [facility] was called her prescription was on file, she will be able to pick up the Oxycodone on 09/03/2021.  Placed a call to [name] regarding the above, she verbalizes understanding.

## 2021-09-02 NOTE — Telephone Encounter (Signed)
Filled  Written  ID  Drug  QTY  Days  Prescriber  RX #  Dispenser  Refill  Daily Dose*  Pymt Type  PMP  08/27/2021 08/27/2021 2  Clonazepam 0.5 Mg Tablet 60.00 30 Ki Sha [id number] Nor (2372) 0/0 2.00 LME Medicare [place] 08/04/2021 07/22/2021 2  Oxycodone Hcl (Ir) 10 Mg Tab 90.00 30 Eu Tho [id number] Nor (2372) 0/0 45.00 MME Medicare [place]  [facility] [address] [facility]

## 2021-09-16 DIAGNOSIS — K5900 Constipation, unspecified: Secondary | ICD-10-CM | POA: Diagnosis not present

## 2021-09-16 DIAGNOSIS — Z72820 Sleep deprivation: Secondary | ICD-10-CM | POA: Diagnosis not present

## 2021-09-16 DIAGNOSIS — E118 Type 2 diabetes mellitus with unspecified complications: Secondary | ICD-10-CM | POA: Diagnosis not present

## 2021-09-16 DIAGNOSIS — I10 Essential (primary) hypertension: Secondary | ICD-10-CM | POA: Diagnosis not present

## 2021-09-16 DIAGNOSIS — Z79899 Other long term (current) drug therapy: Secondary | ICD-10-CM | POA: Diagnosis not present

## 2021-09-24 ENCOUNTER — Encounter: Payer: Medicare Other | Admitting: Registered Nurse

## 2021-09-25 ENCOUNTER — Encounter: Payer: Medicare Other | Attending: Registered Nurse | Admitting: Registered Nurse

## 2021-09-25 ENCOUNTER — Encounter: Payer: Self-pay | Admitting: Registered Nurse

## 2021-09-25 VITALS — BP 121/80 | HR 85 | Ht 66.0 in | Wt 294.6 lb

## 2021-09-25 DIAGNOSIS — R202 Paresthesia of skin: Secondary | ICD-10-CM | POA: Insufficient documentation

## 2021-09-25 DIAGNOSIS — S8412XS Injury of peroneal nerve at lower leg level, left leg, sequela: Secondary | ICD-10-CM | POA: Insufficient documentation

## 2021-09-25 DIAGNOSIS — Z79891 Long term (current) use of opiate analgesic: Secondary | ICD-10-CM | POA: Diagnosis not present

## 2021-09-25 DIAGNOSIS — M25512 Pain in left shoulder: Secondary | ICD-10-CM | POA: Insufficient documentation

## 2021-09-25 DIAGNOSIS — M25511 Pain in right shoulder: Secondary | ICD-10-CM | POA: Diagnosis not present

## 2021-09-25 DIAGNOSIS — G8929 Other chronic pain: Secondary | ICD-10-CM | POA: Diagnosis not present

## 2021-09-25 DIAGNOSIS — M47816 Spondylosis without myelopathy or radiculopathy, lumbar region: Secondary | ICD-10-CM | POA: Diagnosis not present

## 2021-09-25 DIAGNOSIS — M1711 Unilateral primary osteoarthritis, right knee: Secondary | ICD-10-CM | POA: Insufficient documentation

## 2021-09-25 DIAGNOSIS — Z5181 Encounter for therapeutic drug level monitoring: Secondary | ICD-10-CM | POA: Insufficient documentation

## 2021-09-25 DIAGNOSIS — M1712 Unilateral primary osteoarthritis, left knee: Secondary | ICD-10-CM | POA: Insufficient documentation

## 2021-09-25 DIAGNOSIS — G894 Chronic pain syndrome: Secondary | ICD-10-CM | POA: Diagnosis not present

## 2021-09-25 MED ORDER — OXYCODONE HCL 10 MG PO TABS: 10.0000 mg | ORAL_TABLET | Freq: Three times a day (TID) | ORAL | 0 refills | Status: DC | PRN
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
## 2021-09-26 ENCOUNTER — Telehealth: Payer: Self-pay

## 2021-09-26 NOTE — Patient Outreach (Signed)
  Care Management   Outreach Note  09/26/2021 Name: Katherine [name] MRN: [id number] DOB: [birth year]  An unsuccessful telephone outreach was attempted today. The patient was referred to the case management team for assistance with care management and care coordination.    Follow Up Plan:  A HIPAA compliant voice message was left today requesting a return call.  [facility] [phone]

## 2021-09-30 ENCOUNTER — Other Ambulatory Visit: Payer: Self-pay | Admitting: Registered Nurse

## 2021-09-30 DIAGNOSIS — G5712 Meralgia paresthetica, left lower limb: Secondary | ICD-10-CM

## 2021-09-30 DIAGNOSIS — M19172 Post-traumatic osteoarthritis, left ankle and foot: Secondary | ICD-10-CM

## 2021-09-30 DIAGNOSIS — M170 Bilateral primary osteoarthritis of knee: Secondary | ICD-10-CM

## 2021-10-01 LAB — TOXASSURE SELECT,+ANTIDEPR,UR

## 2021-10-07 ENCOUNTER — Telehealth: Payer: Self-pay | Admitting: *Deleted

## 2021-10-07 NOTE — Telephone Encounter (Signed)
Urine drug screen for this encounter is consistent for prescribed medication 

## 2021-10-08 DIAGNOSIS — Z724 Inappropriate diet and eating habits: Secondary | ICD-10-CM | POA: Diagnosis not present

## 2021-10-08 DIAGNOSIS — Z7985 Long-term (current) use of injectable non-insulin antidiabetic drugs: Secondary | ICD-10-CM | POA: Diagnosis not present

## 2021-10-08 DIAGNOSIS — E65 Localized adiposity: Secondary | ICD-10-CM | POA: Diagnosis not present

## 2021-10-08 DIAGNOSIS — K7469 Other cirrhosis of liver: Secondary | ICD-10-CM | POA: Diagnosis not present

## 2021-10-08 DIAGNOSIS — E1165 Type 2 diabetes mellitus with hyperglycemia: Secondary | ICD-10-CM | POA: Diagnosis not present

## 2021-10-08 DIAGNOSIS — Z794 Long term (current) use of insulin: Secondary | ICD-10-CM | POA: Diagnosis not present

## 2021-10-08 DIAGNOSIS — Z79899 Other long term (current) drug therapy: Secondary | ICD-10-CM | POA: Diagnosis not present

## 2021-10-08 DIAGNOSIS — Z7984 Long term (current) use of oral hypoglycemic drugs: Secondary | ICD-10-CM | POA: Diagnosis not present

## 2021-10-08 DIAGNOSIS — K5904 Chronic idiopathic constipation: Secondary | ICD-10-CM | POA: Diagnosis not present

## 2021-10-09 ENCOUNTER — Encounter (INDEPENDENT_AMBULATORY_CARE_PROVIDER_SITE_OTHER): Payer: Self-pay

## 2021-10-09 DIAGNOSIS — M170 Bilateral primary osteoarthritis of knee: Secondary | ICD-10-CM | POA: Diagnosis not present

## 2021-10-21 ENCOUNTER — Ambulatory Visit: Payer: Self-pay

## 2021-10-21 NOTE — Patient Outreach (Signed)
  Care Coordination   10/21/2021 Name: Katherine [name] MRN: [id number] DOB: 1955/11/06   Care Coordination Outreach Attempts:  A second unsuccessful outreach was attempted today to offer the patient with information about available care coordination services as a benefit of their health plan.     Follow Up Plan:  Additional outreach attempts will be made to offer the patient care coordination information and services.   Encounter Outcome:  No Answer  Care Coordination Interventions Activated:  No   Care Coordination Interventions:  No, not indicated    [facility] [phone]

## 2021-11-01 ENCOUNTER — Telehealth: Payer: Self-pay | Admitting: Registered Nurse

## 2021-11-01 MED ORDER — OXYCODONE HCL 10 MG PO TABS
10.0000 mg | ORAL_TABLET | Freq: Three times a day (TID) | ORAL | 0 refills | Status: DC | PRN
Start: 2021-11-01 — End: 2021-12-09

## 2021-11-01 NOTE — Telephone Encounter (Signed)
PMP was Reviewed.  [facility], [facility] [address]. Katherine [name] is aware via My-Chart Message

## 2021-11-28 ENCOUNTER — Encounter: Payer: Medicare Other | Admitting: Registered Nurse

## 2021-12-04 DIAGNOSIS — M25551 Pain in right hip: Secondary | ICD-10-CM | POA: Diagnosis not present

## 2021-12-04 DIAGNOSIS — M545 Low back pain, unspecified: Secondary | ICD-10-CM | POA: Diagnosis not present

## 2021-12-09 ENCOUNTER — Telehealth: Payer: Self-pay

## 2021-12-09 MED ORDER — OXYCODONE HCL 10 MG PO TABS: 10.0000 mg | ORAL_TABLET | Freq: Three times a day (TID) | ORAL | 0 refills | Status: DC | PRN

## 2021-12-09 NOTE — Telephone Encounter (Signed)
Katherine [name] has been out of pain medication for 2 days. She missed counted and just got back from out of town.   PMP:  Filled  Written  ID  Drug  QTY  Days  Prescriber  RX #  Dispenser  Refill  Daily Dose*  Pymt Type  PMP  11/24/2021 08/13/2021 3  Gabapentin 300 Mg Capsule 180.00 90 Eu Tho [id number] Nor (4650) 1/3  Medicare [place] 11/01/2021 11/01/2021 3  Oxycodone Hcl (Ir) 10 Mg Tab 90.00 30 Eu Tho [id number] Nor (1275) 0/0 45.00 MME Medicare [place] 10/02/2021 09/25/2021 3  Oxycodone Hcl (Ir) 10 Mg Tab 90.00 30 Eu Tho [id number] Nor (2372) 0/0 45.00 MME Medicare [place]

## 2021-12-09 NOTE — Telephone Encounter (Signed)
PMP was Reviewed.  Oxycodone e-scribed to pharmacy.  Placed a call to Katherine [name], she verbalizes understanding.

## 2021-12-11 ENCOUNTER — Encounter: Payer: Medicare Other | Admitting: Registered Nurse

## 2021-12-20 DIAGNOSIS — B372 Candidiasis of skin and nail: Secondary | ICD-10-CM | POA: Diagnosis not present

## 2021-12-30 ENCOUNTER — Encounter: Payer: Medicare Other | Admitting: Registered Nurse

## 2021-12-30 ENCOUNTER — Other Ambulatory Visit: Payer: Self-pay | Admitting: Registered Nurse

## 2021-12-30 DIAGNOSIS — M170 Bilateral primary osteoarthritis of knee: Secondary | ICD-10-CM

## 2021-12-30 DIAGNOSIS — M19172 Post-traumatic osteoarthritis, left ankle and foot: Secondary | ICD-10-CM

## 2021-12-30 DIAGNOSIS — G5712 Meralgia paresthetica, left lower limb: Secondary | ICD-10-CM

## 2021-12-31 DIAGNOSIS — K5900 Constipation, unspecified: Secondary | ICD-10-CM | POA: Diagnosis not present

## 2021-12-31 DIAGNOSIS — R600 Localized edema: Secondary | ICD-10-CM | POA: Diagnosis not present

## 2021-12-31 DIAGNOSIS — K7460 Unspecified cirrhosis of liver: Secondary | ICD-10-CM | POA: Diagnosis not present

## 2021-12-31 DIAGNOSIS — E1165 Type 2 diabetes mellitus with hyperglycemia: Secondary | ICD-10-CM | POA: Diagnosis not present

## 2022-01-07 DIAGNOSIS — E1169 Type 2 diabetes mellitus with other specified complication: Secondary | ICD-10-CM | POA: Diagnosis not present

## 2022-01-07 DIAGNOSIS — Z794 Long term (current) use of insulin: Secondary | ICD-10-CM | POA: Diagnosis not present

## 2022-01-07 DIAGNOSIS — R600 Localized edema: Secondary | ICD-10-CM | POA: Diagnosis not present

## 2022-01-07 DIAGNOSIS — K7460 Unspecified cirrhosis of liver: Secondary | ICD-10-CM | POA: Diagnosis not present

## 2022-01-08 ENCOUNTER — Encounter: Payer: Medicare Other | Attending: Registered Nurse | Admitting: Registered Nurse

## 2022-01-08 VITALS — BP 132/86 | HR 70 | Ht 66.0 in | Wt 305.0 lb

## 2022-01-08 DIAGNOSIS — M1712 Unilateral primary osteoarthritis, left knee: Secondary | ICD-10-CM | POA: Diagnosis not present

## 2022-01-08 DIAGNOSIS — M7061 Trochanteric bursitis, right hip: Secondary | ICD-10-CM | POA: Diagnosis not present

## 2022-01-08 DIAGNOSIS — M1711 Unilateral primary osteoarthritis, right knee: Secondary | ICD-10-CM | POA: Diagnosis not present

## 2022-01-08 DIAGNOSIS — Z79891 Long term (current) use of opiate analgesic: Secondary | ICD-10-CM | POA: Diagnosis not present

## 2022-01-08 DIAGNOSIS — M5416 Radiculopathy, lumbar region: Secondary | ICD-10-CM | POA: Insufficient documentation

## 2022-01-08 DIAGNOSIS — G894 Chronic pain syndrome: Secondary | ICD-10-CM | POA: Insufficient documentation

## 2022-01-08 DIAGNOSIS — R202 Paresthesia of skin: Secondary | ICD-10-CM | POA: Insufficient documentation

## 2022-01-08 DIAGNOSIS — Z5181 Encounter for therapeutic drug level monitoring: Secondary | ICD-10-CM | POA: Diagnosis not present

## 2022-01-08 MED ORDER — OXYCODONE HCL 10 MG PO TABS: 10.0000 mg | ORAL_TABLET | Freq: Three times a day (TID) | ORAL | 0 refills | Status: DC | PRN
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
## 2022-01-14 LAB — TOXASSURE SELECT,+ANTIDEPR,UR

## 2022-01-16 ENCOUNTER — Telehealth: Payer: Self-pay | Admitting: *Deleted

## 2022-01-16 NOTE — Telephone Encounter (Signed)
Urine drug screen for this encounter is consistent for prescribed medication 

## 2022-01-20 DIAGNOSIS — Z23 Encounter for immunization: Secondary | ICD-10-CM | POA: Diagnosis not present

## 2022-01-20 DIAGNOSIS — N182 Chronic kidney disease, stage 2 (mild): Secondary | ICD-10-CM | POA: Diagnosis not present

## 2022-01-20 DIAGNOSIS — R600 Localized edema: Secondary | ICD-10-CM | POA: Diagnosis not present

## 2022-01-20 DIAGNOSIS — K7460 Unspecified cirrhosis of liver: Secondary | ICD-10-CM | POA: Diagnosis not present

## 2022-01-20 DIAGNOSIS — E039 Hypothyroidism, unspecified: Secondary | ICD-10-CM | POA: Diagnosis not present

## 2022-01-20 DIAGNOSIS — I129 Hypertensive chronic kidney disease with stage 1 through stage 4 chronic kidney disease, or unspecified chronic kidney disease: Secondary | ICD-10-CM | POA: Diagnosis not present

## 2022-01-20 DIAGNOSIS — K760 Fatty (change of) liver, not elsewhere classified: Secondary | ICD-10-CM | POA: Diagnosis not present

## 2022-01-21 ENCOUNTER — Other Ambulatory Visit: Payer: Self-pay | Admitting: Family Medicine

## 2022-01-21 DIAGNOSIS — K7460 Unspecified cirrhosis of liver: Secondary | ICD-10-CM

## 2022-01-28 DIAGNOSIS — R0602 Shortness of breath: Secondary | ICD-10-CM | POA: Diagnosis not present

## 2022-01-28 DIAGNOSIS — R058 Other specified cough: Secondary | ICD-10-CM | POA: Diagnosis not present

## 2022-02-04 ENCOUNTER — Ambulatory Visit
Admission: RE | Admit: 2022-02-04 | Discharge: 2022-02-04 | Disposition: A | Discharge status: Home / Self Care | Payer: Medicare Other | Source: Ambulatory Visit | Attending: Nurse Practitioner | Admitting: Nurse Practitioner

## 2022-02-04 ENCOUNTER — Other Ambulatory Visit: Payer: Medicare Other

## 2022-02-04 ENCOUNTER — Other Ambulatory Visit: Payer: Self-pay | Admitting: Nurse Practitioner

## 2022-02-04 DIAGNOSIS — R0602 Shortness of breath: Secondary | ICD-10-CM | POA: Diagnosis not present

## 2022-02-04 DIAGNOSIS — R110 Nausea: Secondary | ICD-10-CM | POA: Diagnosis not present

## 2022-02-04 DIAGNOSIS — R188 Other ascites: Secondary | ICD-10-CM | POA: Diagnosis not present

## 2022-02-04 DIAGNOSIS — K7581 Nonalcoholic steatohepatitis (NASH): Secondary | ICD-10-CM | POA: Diagnosis not present

## 2022-02-04 DIAGNOSIS — R059 Cough, unspecified: Secondary | ICD-10-CM | POA: Diagnosis not present

## 2022-02-04 DIAGNOSIS — K3189 Other diseases of stomach and duodenum: Secondary | ICD-10-CM | POA: Diagnosis not present

## 2022-02-04 DIAGNOSIS — K766 Portal hypertension: Secondary | ICD-10-CM | POA: Diagnosis not present

## 2022-02-04 DIAGNOSIS — K7460 Unspecified cirrhosis of liver: Secondary | ICD-10-CM | POA: Diagnosis not present

## 2022-02-04 DIAGNOSIS — K7682 Hepatic encephalopathy: Secondary | ICD-10-CM | POA: Diagnosis not present

## 2022-02-04 DIAGNOSIS — R252 Cramp and spasm: Secondary | ICD-10-CM | POA: Diagnosis not present

## 2022-02-14 DIAGNOSIS — K766 Portal hypertension: Secondary | ICD-10-CM | POA: Diagnosis not present

## 2022-02-14 DIAGNOSIS — K7460 Unspecified cirrhosis of liver: Secondary | ICD-10-CM | POA: Diagnosis not present

## 2022-02-14 DIAGNOSIS — K7581 Nonalcoholic steatohepatitis (NASH): Secondary | ICD-10-CM | POA: Diagnosis not present

## 2022-02-14 DIAGNOSIS — K3189 Other diseases of stomach and duodenum: Secondary | ICD-10-CM | POA: Diagnosis not present

## 2022-02-14 DIAGNOSIS — K7682 Hepatic encephalopathy: Secondary | ICD-10-CM | POA: Diagnosis not present

## 2022-02-17 ENCOUNTER — Ambulatory Visit
Admission: RE | Admit: 2022-02-17 | Discharge: 2022-02-17 | Disposition: A | Discharge status: Home / Self Care | Payer: Medicare Other | Source: Ambulatory Visit | Attending: Family Medicine | Admitting: Family Medicine

## 2022-02-17 DIAGNOSIS — K7460 Unspecified cirrhosis of liver: Secondary | ICD-10-CM

## 2022-02-17 DIAGNOSIS — Z9049 Acquired absence of other specified parts of digestive tract: Secondary | ICD-10-CM | POA: Diagnosis not present

## 2022-03-05 ENCOUNTER — Encounter: Payer: Medicare Other | Admitting: Registered Nurse

## 2022-03-10 DIAGNOSIS — E1169 Type 2 diabetes mellitus with other specified complication: Secondary | ICD-10-CM | POA: Diagnosis not present

## 2022-03-10 DIAGNOSIS — K7682 Hepatic encephalopathy: Secondary | ICD-10-CM | POA: Diagnosis not present

## 2022-03-10 DIAGNOSIS — R601 Generalized edema: Secondary | ICD-10-CM | POA: Diagnosis not present

## 2022-03-10 DIAGNOSIS — Z794 Long term (current) use of insulin: Secondary | ICD-10-CM | POA: Diagnosis not present

## 2022-03-14 ENCOUNTER — Encounter: Payer: Medicare Other | Admitting: Registered Nurse

## 2022-03-14 ENCOUNTER — Telehealth: Payer: Self-pay | Admitting: Registered Nurse

## 2022-03-14 MED ORDER — OXYCODONE HCL 10 MG PO TABS: 10.0000 mg | ORAL_TABLET | Freq: Three times a day (TID) | ORAL | 0 refills | Status: DC | PRN

## 2022-03-14 NOTE — Telephone Encounter (Signed)
Pt was exposed to RSV and Flu- Resch her appt to 04/02/22- Please send in her Oxycodone to [facility] on [address].
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
## 2022-03-30 ENCOUNTER — Other Ambulatory Visit: Payer: Self-pay | Admitting: Registered Nurse

## 2022-03-30 DIAGNOSIS — M19172 Post-traumatic osteoarthritis, left ankle and foot: Secondary | ICD-10-CM

## 2022-03-30 DIAGNOSIS — G5712 Meralgia paresthetica, left lower limb: Secondary | ICD-10-CM

## 2022-03-30 DIAGNOSIS — M170 Bilateral primary osteoarthritis of knee: Secondary | ICD-10-CM
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
## 2022-04-02 ENCOUNTER — Encounter: Payer: Self-pay | Admitting: Registered Nurse

## 2022-04-02 ENCOUNTER — Encounter: Payer: Medicare Other | Attending: Registered Nurse | Admitting: Registered Nurse

## 2022-04-02 VITALS — BP 110/53 | HR 85 | Ht 66.0 in | Wt 302.0 lb

## 2022-04-02 DIAGNOSIS — M1712 Unilateral primary osteoarthritis, left knee: Secondary | ICD-10-CM

## 2022-04-02 DIAGNOSIS — G5712 Meralgia paresthetica, left lower limb: Secondary | ICD-10-CM

## 2022-04-02 DIAGNOSIS — M19172 Post-traumatic osteoarthritis, left ankle and foot: Secondary | ICD-10-CM | POA: Diagnosis not present

## 2022-04-02 DIAGNOSIS — Z5181 Encounter for therapeutic drug level monitoring: Secondary | ICD-10-CM

## 2022-04-02 DIAGNOSIS — G894 Chronic pain syndrome: Secondary | ICD-10-CM | POA: Diagnosis not present

## 2022-04-02 DIAGNOSIS — G8929 Other chronic pain: Secondary | ICD-10-CM

## 2022-04-02 DIAGNOSIS — Z79891 Long term (current) use of opiate analgesic: Secondary | ICD-10-CM | POA: Diagnosis not present

## 2022-04-02 DIAGNOSIS — M25511 Pain in right shoulder: Secondary | ICD-10-CM | POA: Diagnosis not present

## 2022-04-02 DIAGNOSIS — R202 Paresthesia of skin: Secondary | ICD-10-CM | POA: Diagnosis not present

## 2022-04-02 DIAGNOSIS — M1711 Unilateral primary osteoarthritis, right knee: Secondary | ICD-10-CM | POA: Diagnosis not present

## 2022-04-02 DIAGNOSIS — M170 Bilateral primary osteoarthritis of knee: Secondary | ICD-10-CM | POA: Diagnosis not present

## 2022-04-02 DIAGNOSIS — M545 Low back pain, unspecified: Secondary | ICD-10-CM | POA: Diagnosis not present

## 2022-04-02 DIAGNOSIS — M7061 Trochanteric bursitis, right hip: Secondary | ICD-10-CM | POA: Diagnosis not present

## 2022-04-02 MED ORDER — OXYCODONE HCL 10 MG PO TABS: 10.0000 mg | ORAL_TABLET | Freq: Three times a day (TID) | ORAL | 0 refills | Status: DC | PRN

## 2022-04-02 MED ORDER — DICLOFENAC SODIUM 1 % EX GEL: CUTANEOUS | 2 refills | Status: DC

## 2022-04-03 DIAGNOSIS — Z794 Long term (current) use of insulin: Secondary | ICD-10-CM | POA: Diagnosis not present

## 2022-04-03 DIAGNOSIS — R600 Localized edema: Secondary | ICD-10-CM | POA: Diagnosis not present

## 2022-04-03 DIAGNOSIS — E1165 Type 2 diabetes mellitus with hyperglycemia: Secondary | ICD-10-CM | POA: Diagnosis not present

## 2022-04-03 DIAGNOSIS — E1169 Type 2 diabetes mellitus with other specified complication: Secondary | ICD-10-CM | POA: Diagnosis not present

## 2022-04-03 DIAGNOSIS — K7581 Nonalcoholic steatohepatitis (NASH): Secondary | ICD-10-CM | POA: Diagnosis not present

## 2022-04-23 ENCOUNTER — Inpatient Hospital Stay (HOSPITAL_BASED_OUTPATIENT_CLINIC_OR_DEPARTMENT_OTHER)
Admission: EM | Admit: 2022-04-23 | Discharge: 2022-04-26 | DRG: 871 | Disposition: A | Discharge status: Home Health | Payer: Medicare Other | Attending: Internal Medicine | Admitting: Internal Medicine

## 2022-04-23 ENCOUNTER — Emergency Department (HOSPITAL_BASED_OUTPATIENT_CLINIC_OR_DEPARTMENT_OTHER): Payer: Medicare Other

## 2022-04-23 ENCOUNTER — Other Ambulatory Visit: Payer: Self-pay

## 2022-04-23 DIAGNOSIS — G43909 Migraine, unspecified, not intractable, without status migrainosus: Secondary | ICD-10-CM | POA: Diagnosis present

## 2022-04-23 DIAGNOSIS — D696 Thrombocytopenia, unspecified: Secondary | ICD-10-CM | POA: Diagnosis not present

## 2022-04-23 DIAGNOSIS — K219 Gastro-esophageal reflux disease without esophagitis: Secondary | ICD-10-CM | POA: Diagnosis present

## 2022-04-23 DIAGNOSIS — Z720 Tobacco use: Secondary | ICD-10-CM

## 2022-04-23 DIAGNOSIS — N179 Acute kidney failure, unspecified: Secondary | ICD-10-CM | POA: Diagnosis present

## 2022-04-23 DIAGNOSIS — Z885 Allergy status to narcotic agent status: Secondary | ICD-10-CM

## 2022-04-23 DIAGNOSIS — J690 Pneumonitis due to inhalation of food and vomit: Secondary | ICD-10-CM | POA: Diagnosis present

## 2022-04-23 DIAGNOSIS — K7030 Alcoholic cirrhosis of liver without ascites: Secondary | ICD-10-CM | POA: Diagnosis not present

## 2022-04-23 DIAGNOSIS — G4730 Sleep apnea, unspecified: Secondary | ICD-10-CM | POA: Diagnosis present

## 2022-04-23 DIAGNOSIS — K7460 Unspecified cirrhosis of liver: Secondary | ICD-10-CM | POA: Diagnosis not present

## 2022-04-23 DIAGNOSIS — I509 Heart failure, unspecified: Secondary | ICD-10-CM | POA: Diagnosis not present

## 2022-04-23 DIAGNOSIS — M21372 Foot drop, left foot: Secondary | ICD-10-CM | POA: Diagnosis present

## 2022-04-23 DIAGNOSIS — Z7985 Long-term (current) use of injectable non-insulin antidiabetic drugs: Secondary | ICD-10-CM

## 2022-04-23 DIAGNOSIS — Z6841 Body Mass Index (BMI) 40.0 and over, adult: Secondary | ICD-10-CM

## 2022-04-23 DIAGNOSIS — Z841 Family history of disorders of kidney and ureter: Secondary | ICD-10-CM

## 2022-04-23 DIAGNOSIS — E039 Hypothyroidism, unspecified: Secondary | ICD-10-CM | POA: Diagnosis not present

## 2022-04-23 DIAGNOSIS — R5383 Other fatigue: Secondary | ICD-10-CM | POA: Diagnosis not present

## 2022-04-23 DIAGNOSIS — I10 Essential (primary) hypertension: Secondary | ICD-10-CM | POA: Diagnosis not present

## 2022-04-23 DIAGNOSIS — Z7984 Long term (current) use of oral hypoglycemic drugs: Secondary | ICD-10-CM

## 2022-04-23 DIAGNOSIS — Z6372 Alcoholism and drug addiction in family: Secondary | ICD-10-CM

## 2022-04-23 DIAGNOSIS — Z66 Do not resuscitate: Secondary | ICD-10-CM | POA: Diagnosis not present

## 2022-04-23 DIAGNOSIS — E119 Type 2 diabetes mellitus without complications: Secondary | ICD-10-CM | POA: Diagnosis not present

## 2022-04-23 DIAGNOSIS — E860 Dehydration: Secondary | ICD-10-CM | POA: Diagnosis not present

## 2022-04-23 DIAGNOSIS — Z888 Allergy status to other drugs, medicaments and biological substances status: Secondary | ICD-10-CM

## 2022-04-23 DIAGNOSIS — A419 Sepsis, unspecified organism: Principal | ICD-10-CM | POA: Diagnosis present

## 2022-04-23 DIAGNOSIS — F39 Unspecified mood [affective] disorder: Secondary | ICD-10-CM | POA: Diagnosis present

## 2022-04-23 DIAGNOSIS — K7581 Nonalcoholic steatohepatitis (NASH): Secondary | ICD-10-CM | POA: Diagnosis not present

## 2022-04-23 DIAGNOSIS — Z1152 Encounter for screening for COVID-19: Secondary | ICD-10-CM | POA: Diagnosis not present

## 2022-04-23 DIAGNOSIS — Z87891 Personal history of nicotine dependence: Secondary | ICD-10-CM

## 2022-04-23 DIAGNOSIS — J189 Pneumonia, unspecified organism: Secondary | ICD-10-CM | POA: Diagnosis not present

## 2022-04-23 DIAGNOSIS — E876 Hypokalemia: Secondary | ICD-10-CM | POA: Diagnosis present

## 2022-04-23 DIAGNOSIS — Z8249 Family history of ischemic heart disease and other diseases of the circulatory system: Secondary | ICD-10-CM

## 2022-04-23 DIAGNOSIS — J9601 Acute respiratory failure with hypoxia: Secondary | ICD-10-CM | POA: Diagnosis present

## 2022-04-23 DIAGNOSIS — Z79891 Long term (current) use of opiate analgesic: Secondary | ICD-10-CM

## 2022-04-23 DIAGNOSIS — Z7982 Long term (current) use of aspirin: Secondary | ICD-10-CM

## 2022-04-23 DIAGNOSIS — E785 Hyperlipidemia, unspecified: Secondary | ICD-10-CM | POA: Diagnosis not present

## 2022-04-23 DIAGNOSIS — R112 Nausea with vomiting, unspecified: Secondary | ICD-10-CM

## 2022-04-23 DIAGNOSIS — R6520 Severe sepsis without septic shock: Secondary | ICD-10-CM | POA: Diagnosis present

## 2022-04-23 DIAGNOSIS — I272 Pulmonary hypertension, unspecified: Secondary | ICD-10-CM | POA: Diagnosis present

## 2022-04-23 DIAGNOSIS — D731 Hypersplenism: Secondary | ICD-10-CM | POA: Diagnosis present

## 2022-04-23 DIAGNOSIS — E1169 Type 2 diabetes mellitus with other specified complication: Secondary | ICD-10-CM | POA: Diagnosis not present

## 2022-04-23 DIAGNOSIS — Z811 Family history of alcohol abuse and dependence: Secondary | ICD-10-CM

## 2022-04-23 DIAGNOSIS — Z7983 Long term (current) use of bisphosphonates: Secondary | ICD-10-CM

## 2022-04-23 DIAGNOSIS — Z794 Long term (current) use of insulin: Secondary | ICD-10-CM

## 2022-04-23 DIAGNOSIS — Z79890 Hormone replacement therapy (postmenopausal): Secondary | ICD-10-CM

## 2022-04-23 DIAGNOSIS — Z801 Family history of malignant neoplasm of trachea, bronchus and lung: Secondary | ICD-10-CM

## 2022-04-23 DIAGNOSIS — R300 Dysuria: Secondary | ICD-10-CM | POA: Diagnosis not present

## 2022-04-23 DIAGNOSIS — G894 Chronic pain syndrome: Secondary | ICD-10-CM | POA: Diagnosis present

## 2022-04-23 DIAGNOSIS — R6889 Other general symptoms and signs: Secondary | ICD-10-CM | POA: Diagnosis not present

## 2022-04-23 DIAGNOSIS — Z79899 Other long term (current) drug therapy: Secondary | ICD-10-CM

## 2022-04-23 DIAGNOSIS — R0602 Shortness of breath: Secondary | ICD-10-CM | POA: Diagnosis not present

## 2022-04-23 LAB — CBC WITH DIFFERENTIAL/PLATELET
Abs Immature Granulocytes: 0 10*3/uL (ref 0.00–0.07)
Band Neutrophils: 9 %
Basophils Absolute: 0 10*3/uL (ref 0.0–0.1)
Basophils Relative: 0 %
Eosinophils Absolute: 0 10*3/uL (ref 0.0–0.5)
Eosinophils Relative: 0 %
HCT: 41.5 % (ref 36.0–46.0)
Hemoglobin: 13.7 g/dL (ref 12.0–15.0)
Lymphocytes Relative: 2 %
Lymphs Abs: 0.4 10*3/uL — ABNORMAL LOW (ref 0.7–4.0)
MCH: 27.5 pg (ref 26.0–34.0)
MCHC: 33 g/dL (ref 30.0–36.0)
MCV: 83.3 fL (ref 80.0–100.0)
Monocytes Absolute: 1 10*3/uL (ref 0.1–1.0)
Monocytes Relative: 5 %
Neutro Abs: 17.7 10*3/uL — ABNORMAL HIGH (ref 1.7–7.7)
Neutrophils Relative %: 84 %
Platelets: 75 10*3/uL — ABNORMAL LOW (ref 150–400)
RBC: 4.98 MIL/uL (ref 3.87–5.11)
RDW: 14.1 % (ref 11.5–15.5)
WBC: 19 10*3/uL — ABNORMAL HIGH (ref 4.0–10.5)
nRBC: 0 % (ref 0.0–0.2)

## 2022-04-23 LAB — COMPREHENSIVE METABOLIC PANEL
ALT: 21 U/L (ref 0–44)
AST: 43 U/L — ABNORMAL HIGH (ref 15–41)
Albumin: 3.3 g/dL — ABNORMAL LOW (ref 3.5–5.0)
Alkaline Phosphatase: 73 U/L (ref 38–126)
Anion gap: 11 (ref 5–15)
BUN: 25 mg/dL — ABNORMAL HIGH (ref 8–23)
CO2: 28 mmol/L (ref 22–32)
Calcium: 10.1 mg/dL (ref 8.9–10.3)
Chloride: 92 mmol/L — ABNORMAL LOW (ref 98–111)
Creatinine, Ser: 1.23 mg/dL — ABNORMAL HIGH (ref 0.44–1.00)
GFR, Estimated: 48 mL/min — ABNORMAL LOW (ref >60)
Glucose, Bld: 193 mg/dL — ABNORMAL HIGH (ref 70–99)
Potassium: 3.3 mmol/L — ABNORMAL LOW (ref 3.5–5.1)
Sodium: 131 mmol/L — ABNORMAL LOW (ref 135–145)
Total Bilirubin: 2.1 mg/dL — ABNORMAL HIGH (ref 0.3–1.2)
Total Protein: 7.7 g/dL (ref 6.5–8.1)

## 2022-04-23 LAB — LACTIC ACID, PLASMA: Lactic Acid, Venous: 1.8 mmol/L (ref 0.5–1.9)

## 2022-04-23 LAB — RESP PANEL BY RT-PCR (RSV, FLU A&B, COVID)  RVPGX2
Influenza A by PCR: NEGATIVE
Influenza B by PCR: NEGATIVE
Resp Syncytial Virus by PCR: NEGATIVE
SARS Coronavirus 2 by RT PCR: NEGATIVE

## 2022-04-23 LAB — BRAIN NATRIURETIC PEPTIDE: B Natriuretic Peptide: 82.4 pg/mL (ref 0.0–100.0)

## 2022-04-23 MED ORDER — SODIUM CHLORIDE 0.9 % IV SOLN
500.0000 mg | Freq: Once | INTRAVENOUS | Status: AC
  Administered 2022-04-23: 500 mg via INTRAVENOUS
  Filled 2022-04-23: qty 5

## 2022-04-23 MED ORDER — ONDANSETRON HCL 4 MG/2ML IJ SOLN
4.0000 mg | Freq: Once | INTRAMUSCULAR | Status: AC
  Administered 2022-04-24: 4 mg via INTRAVENOUS
  Filled 2022-04-23: qty 2

## 2022-04-23 MED ORDER — LACTATED RINGERS IV SOLN: INTRAVENOUS | Status: DC

## 2022-04-23 MED ORDER — LACTATED RINGERS IV BOLUS
500.0000 mL | Freq: Once | INTRAVENOUS | Status: AC
  Administered 2022-04-23: 500 mL via INTRAVENOUS

## 2022-04-23 MED ORDER — SODIUM CHLORIDE 0.9 % IV SOLN
1.0000 g | Freq: Once | INTRAVENOUS | Status: AC
  Administered 2022-04-23: 1 g via INTRAVENOUS
  Filled 2022-04-23: qty 10

## 2022-04-23 NOTE — ED Notes (Signed)
Pt advised that urine sample is needed, pt reports that she feels too weak to walk at this time

## 2022-04-23 NOTE — ED Notes (Signed)
Placed on purewick to void, pt begins dry heaving and having nausea.  Zofran ordered per protocol

## 2022-04-23 NOTE — ED Triage Notes (Signed)
Reports a fall last week, she sts she was reaching for something and fell to her knees. Denies hitting head, blood thinners, or LOC. Cont to have knee pain.

## 2022-04-23 NOTE — ED Triage Notes (Signed)
POV from home, BIB wheelchair using walker at baseline, GCS 15, A&O x 4.  Pt sts over the past couple days she been experiencing dizziness, painful orange urine, trouble breathing, vomiting began last night. Send from UC 90% on RA there. UA at [facility] was positive for leukocytes, protein and glucose.

## 2022-04-23 NOTE — ED Notes (Signed)
[name], pts daughter requests to be called when bed is ready. Number on file.

## 2022-04-23 NOTE — Progress Notes (Signed)
Plan of Care Note for accepted transfer   Patient: Katherine [name] MRN: [id number]   DOA: 04/23/2022  Facility requesting transfer: [facility] Requesting Provider: Dr. Vallery Ridge, [place] Reason for transfer: Community-acquired pneumonia Facility course: The patient is a 67 year old female with past medical history significant for essential hypertension, type 2 diabetes, hyperlipidemia, hypothyroidism, chronic anxiety, pulmonary hypertension, COPD, GERD, NASH, liver cirrhosis secondary to [facility], history of upper GI bleed, GERD, who presented to [facility] ED with complaints of intractable nausea and vomiting, shortness of breath, dizziness, pain with urination.    In the ED, lab studies notable for leukocytosis with WBC 19,000, vital signs remarkable for elevated temperature 100.2, tachypnea with respiration rate 24.    Chest x-ray showed right-sided perihilar predominantly upper lobe broncopneumonia.  The patient was started on empiric IV antibiotics for community-acquired pneumonia, Rocephin and azithromycin.    The patient was admitted at [facility] telemetry medical unit as inpatient status.  Plan of care: The patient is accepted for admission to Telemetry unit, at [facility].  Inpatient status.  Author: Kayleen Memos, DO 04/23/2022  Check [email or web address] for on-call coverage.  Nursing staff, Please call [place] number on Amion as soon as patient's arrival, so appropriate admitting provider can evaluate the pt.

## 2022-04-23 NOTE — ED Provider Notes (Signed)
[facility] Provider Note   CSN: [id number] Arrival date & time: 04/23/22  1855     History {Add pertinent medical, surgical, social history, OB history to HPI:1} Chief Complaint  Patient presents with   Dizziness   Dysuria   Emesis    Katherine [name] is a 67 y.o. female.  HPI Sx onset Monday night. All symptoms at one time, dizzy, pain with urination, vomited all night Monday. Continues into Tuesday, nauseated today but not vomited. Not eaten since Monday. Some associated diarrhea. Chills at home, hot cold episodes. No chest pain, but feels like hard to catch breath. Not had any regular meds since MOnday.    Home Medications Prior to Admission medications   Medication Sig Start Date End Date Taking? Authorizing Provider  alendronate (FOSAMAX) 70 MG tablet Take 70 mg by mouth once a week. Take with a full glass of water on an empty stomach.    [provider]  aspirin EC 81 MG tablet Take 81 mg by mouth daily. Patient not taking: Reported on 04/02/2022    [provider]  BD PEN NEEDLE NANO U/F 32G X 4 MM MISC  03/17/14   [provider]  clonazePAM (KLONOPIN) 0.5 MG tablet 1 tablet 09/24/21   [provider]  diclofenac Sodium (VOLTAREN) 1 % GEL Apply 4 times daily as needed 04/02/22   Bayard Hugger, NP  diphenhydrAMINE (BENADRYL) 25 MG tablet 1 tablet at bedtime as needed    [provider]  DULoxetine (CYMBALTA) 60 MG capsule     [provider]  econazole nitrate 1 % cream SMARTSIG:1 Topical Daily 12/27/21   [provider]  FARXIGA 5 MG TABS tablet Take 5 mg by mouth daily. 05/20/21   [provider]  furosemide (LASIX) 20 MG tablet Take 20 mg by mouth.    [provider]  gabapentin (NEURONTIN) 300 MG capsule TAKE 1 CAPSULE BY MOUTH TWICE A DAY 08/13/21   Danella Sensing L, NP  insulin glargine, 1 Unit Dial, ([name] SOLOSTAR) 300 UNIT/ML Solostar Pen  Inject 50 Units into the skin daily.    [provider]  levothyroxine (SYNTHROID) 25 MCG tablet TAKE 1 TABLET BY MOUTH DAILY BEFORE BREAKFAST. 05/02/21   Briscoe Deutscher, DO  lidocaine (XYLOCAINE) 5 % ointment SMARTSIG:1 Application Topical Every 6 Hours PRN 12/20/21   [provider]  linaclotide Rolan Lipa) 145 MCG CAPS capsule 1 capsule at least 30 minutes before the first meal of the day on an empty stomach 08/01/21   [provider]  LINZESS 290 MCG CAPS capsule Take 290 mcg by mouth daily. 08/30/21   [provider]  methocarbamol (ROBAXIN) 500 MG tablet TAKE 1 TABLET BY MOUTH EVERY 6 HOURS AS NEEDED FOR MUSCLE SPASMS. 07/24/21   Bayard Hugger, NP  methylPREDNISolone (MEDROL DOSEPAK) 4 MG TBPK tablet Take by mouth. 12/04/21   [provider]  mirtazapine (REMERON) 15 MG tablet 1 tablet at bedtime.    [provider]  Multiple Vitamins-Minerals (CENTRUM WOMEN) TABS Take 1 tablet by mouth daily.    [provider]  mupirocin ointment (BACTROBAN) 2 % Apply 1 application topically 2 (two) times daily. 04/09/21   Briscoe Deutscher, DO  nebivolol (BYSTOLIC) 10 MG tablet 1 tablet    [provider]  Oxycodone HCl 10 MG TABS Take 1 tablet (10 mg total) by mouth 3 (three) times daily as needed. 04/02/22   Bayard Hugger, NP  [place],  2 MG/DOSE, 8 MG/3ML SOPN Inject into the skin. 08/01/21   [provider]  pantoprazole (PROTONIX) 40 MG tablet Take 40 mg by mouth daily. 03/09/18   [provider]  polyethylene glycol (MIRALAX / GLYCOLAX) packet Take 17 g by mouth daily. 04/29/18   Domenic Polite, MD  potassium chloride (KLOR-CON) 10 MEQ tablet Take 10 mEq by mouth 2 (two) times daily. 05/08/21   [provider]  rizatriptan (MAXALT) 10 MG tablet Take 10 mg by mouth as needed for migraine. May repeat in 2 hours if needed    [provider]  rosuvastatin (CRESTOR) 40 MG tablet Take 40 mg by mouth at bedtime.   07/02/17   [provider]  Semaglutide, 1 MG/DOSE, (OZEMPIC, 1 MG/DOSE,) 2 MG/1.5ML SOPN 1 mg weekly    [provider]  Semaglutide, 1 MG/DOSE, (OZEMPIC, 1 MG/DOSE,) 4 MG/3ML SOPN Inject 1 mg into the skin once a week. 05/16/21   Briscoe Deutscher, DO  Semaglutide, 2 MG/DOSE, (OZEMPIC, 2 MG/DOSE,) 8 MG/3ML SOPN 2 mg 05/28/21   [provider]  XIFAXAN 550 MG TABS tablet Take 550 mg by mouth 2 (two) times daily. 05/20/21   [provider]      Allergies    Adhesive [tape], Morphine and related, Morphine sulfate, and Other    Review of Systems   Review of Systems  Physical Exam Updated Vital Signs BP 117/84   Pulse 91   Temp 98.3 F (36.8 C) (Oral)   Resp 14   Wt (!) 137 kg   SpO2 95%   BMI 48.75 kg/m  Physical Exam  ED Results / Procedures / Treatments   Labs (all labs ordered are listed, but only abnormal results are displayed) Labs Reviewed  COMPREHENSIVE METABOLIC PANEL - Abnormal; Notable for the following components:      Result Value   Sodium 131 (*)    Potassium 3.3 (*)    Chloride 92 (*)    Glucose, Bld 193 (*)    BUN 25 (*)    Creatinine, Ser 1.23 (*)    Albumin 3.3 (*)    AST 43 (*)    Total Bilirubin 2.1 (*)    GFR, Estimated 48 (*)    All other components within normal limits  CBC WITH DIFFERENTIAL/PLATELET - Abnormal; Notable for the following components:   WBC 19.0 (*)    Platelets 75 (*)    Neutro Abs 17.7 (*)    Lymphs Abs 0.4 (*)    All other components within normal limits  RESP PANEL BY RT-PCR (RSV, FLU A&B, COVID)  RVPGX2  BRAIN NATRIURETIC PEPTIDE  URINALYSIS, ROUTINE W REFLEX MICROSCOPIC    EKG EKG Interpretation  Date/Time:  Wednesday April 23 2022 19:15:07 EST Ventricular Rate:  99 PR Interval:  160 QRS Duration: 92 QT Interval:  340 QTC Calculation: 436 R Axis:   31 Text Interpretation: Normal sinus rhythm Abnormal ECG When compared with ECG of 19-Jun-2019 04:11, PREVIOUS ECG IS PRESENT increased  rate but similar to previous Confirmed by Charlesetta Shanks [phone]) on 04/23/2022 10:21:38 PM  Radiology DG Chest Port 1 View  Result Date: 04/23/2022 CLINICAL DATA:  Shortness of breath and difficulty breathing EXAM: PORTABLE CHEST 1 VIEW COMPARISON:  02/04/2022 FINDINGS: Heart and mediastinal shadows are normal. The left lung is clear. There is right-sided perihilar, predominantly upper lobe bronchopneumonia. No dense consolidation or lobar collapse. No visible effusion. IMPRESSION: Right-sided perihilar, predominantly upper lobe bronchopneumonia. Electronically Signed   By: Nelson Chimes  M.D.   On: 04/23/2022 21:03    Procedures Procedures  {Document cardiac monitor, telemetry assessment procedure when appropriate:1}  Medications Ordered in ED Medications - No data to display  ED Course/ Medical Decision Making/ A&P   {   Click here for ABCD2, HEART and other calculatorsREFRESH Note before signing :1}                          Medical Decision Making Amount and/or Complexity of Data Reviewed Labs: ordered. Radiology: ordered.  Risk Prescription drug management.   Consult: Dr. Nevada Crane for admission.  {Document critical care time when appropriate:1} {Document review of labs and clinical decision tools ie heart score, Chads2Vasc2 etc:1}  {Document your independent review of radiology images, and any outside records:1} {Document your discussion with family members, caretakers, and with consultants:1} {Document social determinants of health affecting pt's care:1} {Document your decision making why or why not admission, treatments were needed:1} Final Clinical Impression(s) / ED Diagnoses Final diagnoses:  None    Rx / DC Orders ED Discharge Orders     None

## 2022-04-24 ENCOUNTER — Encounter (HOSPITAL_COMMUNITY): Payer: Self-pay | Admitting: Internal Medicine

## 2022-04-24 ENCOUNTER — Other Ambulatory Visit (HOSPITAL_COMMUNITY): Payer: Medicare Other

## 2022-04-24 DIAGNOSIS — R112 Nausea with vomiting, unspecified: Secondary | ICD-10-CM | POA: Diagnosis not present

## 2022-04-24 DIAGNOSIS — I509 Heart failure, unspecified: Secondary | ICD-10-CM | POA: Diagnosis not present

## 2022-04-24 DIAGNOSIS — K7030 Alcoholic cirrhosis of liver without ascites: Secondary | ICD-10-CM | POA: Diagnosis not present

## 2022-04-24 DIAGNOSIS — Z794 Long term (current) use of insulin: Secondary | ICD-10-CM | POA: Diagnosis not present

## 2022-04-24 DIAGNOSIS — Z66 Do not resuscitate: Secondary | ICD-10-CM | POA: Diagnosis present

## 2022-04-24 DIAGNOSIS — I272 Pulmonary hypertension, unspecified: Secondary | ICD-10-CM | POA: Diagnosis present

## 2022-04-24 DIAGNOSIS — N179 Acute kidney failure, unspecified: Secondary | ICD-10-CM | POA: Diagnosis present

## 2022-04-24 DIAGNOSIS — E1169 Type 2 diabetes mellitus with other specified complication: Secondary | ICD-10-CM

## 2022-04-24 DIAGNOSIS — J9601 Acute respiratory failure with hypoxia: Secondary | ICD-10-CM | POA: Diagnosis not present

## 2022-04-24 DIAGNOSIS — K7460 Unspecified cirrhosis of liver: Secondary | ICD-10-CM | POA: Diagnosis present

## 2022-04-24 DIAGNOSIS — E119 Type 2 diabetes mellitus without complications: Secondary | ICD-10-CM | POA: Diagnosis present

## 2022-04-24 DIAGNOSIS — A419 Sepsis, unspecified organism: Secondary | ICD-10-CM | POA: Diagnosis present

## 2022-04-24 DIAGNOSIS — D696 Thrombocytopenia, unspecified: Secondary | ICD-10-CM | POA: Diagnosis present

## 2022-04-24 DIAGNOSIS — E785 Hyperlipidemia, unspecified: Secondary | ICD-10-CM | POA: Diagnosis present

## 2022-04-24 DIAGNOSIS — I10 Essential (primary) hypertension: Secondary | ICD-10-CM | POA: Diagnosis present

## 2022-04-24 DIAGNOSIS — G894 Chronic pain syndrome: Secondary | ICD-10-CM | POA: Diagnosis present

## 2022-04-24 DIAGNOSIS — Z1152 Encounter for screening for COVID-19: Secondary | ICD-10-CM | POA: Diagnosis not present

## 2022-04-24 DIAGNOSIS — F39 Unspecified mood [affective] disorder: Secondary | ICD-10-CM | POA: Diagnosis present

## 2022-04-24 DIAGNOSIS — Z6841 Body Mass Index (BMI) 40.0 and over, adult: Secondary | ICD-10-CM | POA: Diagnosis not present

## 2022-04-24 DIAGNOSIS — E039 Hypothyroidism, unspecified: Secondary | ICD-10-CM | POA: Diagnosis present

## 2022-04-24 DIAGNOSIS — E860 Dehydration: Secondary | ICD-10-CM | POA: Diagnosis present

## 2022-04-24 DIAGNOSIS — J690 Pneumonitis due to inhalation of food and vomit: Secondary | ICD-10-CM | POA: Diagnosis present

## 2022-04-24 DIAGNOSIS — J189 Pneumonia, unspecified organism: Secondary | ICD-10-CM | POA: Diagnosis not present

## 2022-04-24 DIAGNOSIS — K7581 Nonalcoholic steatohepatitis (NASH): Secondary | ICD-10-CM | POA: Diagnosis present

## 2022-04-24 DIAGNOSIS — E876 Hypokalemia: Secondary | ICD-10-CM | POA: Diagnosis present

## 2022-04-24 DIAGNOSIS — R6520 Severe sepsis without septic shock: Secondary | ICD-10-CM | POA: Diagnosis present

## 2022-04-24 DIAGNOSIS — Z87891 Personal history of nicotine dependence: Secondary | ICD-10-CM | POA: Diagnosis not present

## 2022-04-24 DIAGNOSIS — Z8249 Family history of ischemic heart disease and other diseases of the circulatory system: Secondary | ICD-10-CM | POA: Diagnosis not present

## 2022-04-24 LAB — LACTIC ACID, PLASMA
Lactic Acid, Venous: 2.5 mmol/L (ref 0.5–1.9)
Lactic Acid, Venous: 1.4 mmol/L (ref 0.5–1.9)
Lactic Acid, Venous: 1.8 mmol/L (ref 0.5–1.9)

## 2022-04-24 LAB — CBC WITH DIFFERENTIAL/PLATELET
Abs Immature Granulocytes: 0.52 10*3/uL — ABNORMAL HIGH (ref 0.00–0.07)
Basophils Absolute: 0 10*3/uL (ref 0.0–0.1)
Basophils Relative: 0 %
Eosinophils Absolute: 0 10*3/uL (ref 0.0–0.5)
Eosinophils Relative: 0 %
HCT: 38.2 % (ref 36.0–46.0)
Hemoglobin: 12.4 g/dL (ref 12.0–15.0)
Immature Granulocytes: 4 %
Lymphocytes Relative: 9 %
Lymphs Abs: 1.2 10*3/uL (ref 0.7–4.0)
MCH: 28 pg (ref 26.0–34.0)
MCHC: 32.5 g/dL (ref 30.0–36.0)
MCV: 86.2 fL (ref 80.0–100.0)
Monocytes Absolute: 0.9 10*3/uL (ref 0.1–1.0)
Monocytes Relative: 7 %
Neutro Abs: 11 10*3/uL — ABNORMAL HIGH (ref 1.7–7.7)
Neutrophils Relative %: 80 %
Platelets: 61 10*3/uL — ABNORMAL LOW (ref 150–400)
RBC: 4.43 MIL/uL (ref 3.87–5.11)
RDW: 13.9 % (ref 11.5–15.5)
WBC: 13.7 10*3/uL — ABNORMAL HIGH (ref 4.0–10.5)
nRBC: 0 % (ref 0.0–0.2)

## 2022-04-24 LAB — URINALYSIS, ROUTINE W REFLEX MICROSCOPIC
Bacteria, UA: NONE SEEN
Bilirubin Urine: NEGATIVE
Glucose, UA: >1000 mg/dL — AB
Ketones, ur: NEGATIVE mg/dL
Nitrite: NEGATIVE
Specific Gravity, Urine: 1.012 (ref 1.005–1.030)
pH: 5.5 (ref 5.0–8.0)

## 2022-04-24 LAB — GLUCOSE, CAPILLARY
Glucose-Capillary: 136 mg/dL — ABNORMAL HIGH (ref 70–99)
Glucose-Capillary: 108 mg/dL — ABNORMAL HIGH (ref 70–99)
Glucose-Capillary: 160 mg/dL — ABNORMAL HIGH (ref 70–99)
Glucose-Capillary: 114 mg/dL — ABNORMAL HIGH (ref 70–99)
Glucose-Capillary: 135 mg/dL — ABNORMAL HIGH (ref 70–99)

## 2022-04-24 LAB — BASIC METABOLIC PANEL
Anion gap: 13 (ref 5–15)
BUN: 23 mg/dL (ref 8–23)
CO2: 27 mmol/L (ref 22–32)
Calcium: 9.1 mg/dL (ref 8.9–10.3)
Chloride: 95 mmol/L — ABNORMAL LOW (ref 98–111)
Creatinine, Ser: 1.21 mg/dL — ABNORMAL HIGH (ref 0.44–1.00)
GFR, Estimated: 49 mL/min — ABNORMAL LOW (ref >60)
Glucose, Bld: 126 mg/dL — ABNORMAL HIGH (ref 70–99)
Potassium: 3.1 mmol/L — ABNORMAL LOW (ref 3.5–5.1)
Sodium: 135 mmol/L (ref 135–145)

## 2022-04-24 LAB — HIV ANTIBODY (ROUTINE TESTING W REFLEX): HIV Screen 4th Generation wRfx: NONREACTIVE

## 2022-04-24 LAB — STREP PNEUMONIAE URINARY ANTIGEN: Strep Pneumo Urinary Antigen: NEGATIVE

## 2022-04-24 LAB — HEMOGLOBIN A1C
Hgb A1c MFr Bld: 6.4 % — ABNORMAL HIGH (ref 4.8–5.6)
Mean Plasma Glucose: 136.98 mg/dL

## 2022-04-24 MED ORDER — INSULIN ASPART 100 UNIT/ML IJ SOLN: 0.0000 [IU] | Freq: Every day | INTRAMUSCULAR | Status: DC

## 2022-04-24 MED ORDER — SODIUM CHLORIDE 0.9 % IV SOLN: INTRAVENOUS | Status: DC | PRN

## 2022-04-24 MED ORDER — SODIUM CHLORIDE 0.9 % IV SOLN: 2.0000 g | INTRAVENOUS | Status: DC

## 2022-04-24 MED ORDER — SODIUM CHLORIDE 0.9 % IV SOLN
500.0000 mg | INTRAVENOUS | Status: DC
  Filled 2022-04-24: qty 5

## 2022-04-24 MED ORDER — INSULIN GLARGINE (1 UNIT DIAL) 300 UNIT/ML ~~LOC~~ SOPN: 50.0000 [IU] | PEN_INJECTOR | Freq: Every day | SUBCUTANEOUS | Status: DC

## 2022-04-24 MED ORDER — INSULIN ASPART 100 UNIT/ML IJ SOLN
0.0000 [IU] | Freq: Three times a day (TID) | INTRAMUSCULAR | Status: DC
  Administered 2022-04-24: 3 [IU] via SUBCUTANEOUS

## 2022-04-24 MED ORDER — POTASSIUM CHLORIDE 20 MEQ PO PACK: 40.0000 meq | PACK | Freq: Once | ORAL | Status: DC

## 2022-04-24 MED ORDER — OXYCODONE HCL 5 MG PO TABS
10.0000 mg | ORAL_TABLET | Freq: Three times a day (TID) | ORAL | Status: DC | PRN
  Administered 2022-04-24 – 2022-04-26 (×5): 10 mg via ORAL
  Filled 2022-04-24 (×5): qty 2

## 2022-04-24 MED ORDER — ACETAMINOPHEN 325 MG PO TABS: 650.0000 mg | ORAL_TABLET | Freq: Four times a day (QID) | ORAL | Status: DC | PRN

## 2022-04-24 MED ORDER — SODIUM CHLORIDE 0.9 % IV SOLN
500.0000 mg | INTRAVENOUS | Status: DC
  Administered 2022-04-24 – 2022-04-25 (×2): 500 mg via INTRAVENOUS
  Filled 2022-04-24 (×3): qty 5

## 2022-04-24 MED ORDER — ACETAMINOPHEN 650 MG RE SUPP: 650.0000 mg | Freq: Four times a day (QID) | RECTAL | Status: DC | PRN

## 2022-04-24 MED ORDER — LEVOTHYROXINE SODIUM 25 MCG PO TABS
25.0000 ug | ORAL_TABLET | Freq: Every day | ORAL | Status: DC
  Administered 2022-04-25 – 2022-04-26 (×2): 25 ug via ORAL
  Filled 2022-04-24 (×2): qty 1

## 2022-04-24 MED ORDER — DAPAGLIFLOZIN PROPANEDIOL 5 MG PO TABS: 5.0000 mg | ORAL_TABLET | Freq: Every day | ORAL | Status: DC

## 2022-04-24 MED ORDER — ALBUTEROL SULFATE (2.5 MG/3ML) 0.083% IN NEBU: 2.5000 mg | INHALATION_SOLUTION | RESPIRATORY_TRACT | Status: DC | PRN

## 2022-04-24 MED ORDER — ROSUVASTATIN CALCIUM 20 MG PO TABS
40.0000 mg | ORAL_TABLET | Freq: Every day | ORAL | Status: DC
  Administered 2022-04-24 – 2022-04-25 (×2): 40 mg via ORAL
  Filled 2022-04-24 (×2): qty 2

## 2022-04-24 MED ORDER — LACTULOSE 10 GM/15ML PO SOLN
20.0000 g | Freq: Two times a day (BID) | ORAL | Status: DC
  Administered 2022-04-24 – 2022-04-25 (×3): 20 g via ORAL
  Filled 2022-04-24 (×3): qty 30

## 2022-04-24 MED ORDER — SODIUM CHLORIDE 0.9 % IV SOLN
3.0000 g | Freq: Four times a day (QID) | INTRAVENOUS | Status: DC
  Administered 2022-04-24 – 2022-04-25 (×7): 3 g via INTRAVENOUS
  Filled 2022-04-24 (×8): qty 8

## 2022-04-24 MED ORDER — DULOXETINE HCL 60 MG PO CPEP
60.0000 mg | ORAL_CAPSULE | Freq: Every day | ORAL | Status: DC
  Administered 2022-04-24 – 2022-04-26 (×3): 60 mg via ORAL
  Filled 2022-04-24 (×3): qty 1

## 2022-04-24 MED ORDER — MELATONIN 5 MG PO TABS
5.0000 mg | ORAL_TABLET | Freq: Every evening | ORAL | Status: DC | PRN
  Administered 2022-04-25: 5 mg via ORAL
  Filled 2022-04-24: qty 1

## 2022-04-24 MED ORDER — ONDANSETRON HCL 4 MG PO TABS: 4.0000 mg | ORAL_TABLET | Freq: Four times a day (QID) | ORAL | Status: DC | PRN

## 2022-04-24 MED ORDER — SODIUM CHLORIDE 0.9% FLUSH
3.0000 mL | Freq: Two times a day (BID) | INTRAVENOUS | Status: DC
  Administered 2022-04-24 – 2022-04-25 (×3): 3 mL via INTRAVENOUS

## 2022-04-24 MED ORDER — NEBIVOLOL HCL 10 MG PO TABS
10.0000 mg | ORAL_TABLET | Freq: Every day | ORAL | Status: DC
  Filled 2022-04-24: qty 1

## 2022-04-24 MED ORDER — ENOXAPARIN SODIUM 40 MG/0.4ML IJ SOSY
40.0000 mg | PREFILLED_SYRINGE | Freq: Every day | INTRAMUSCULAR | Status: DC
  Administered 2022-04-24: 40 mg via SUBCUTANEOUS
  Filled 2022-04-24: qty 0.4

## 2022-04-24 MED ORDER — POLYETHYLENE GLYCOL 3350 17 G PO PACK
17.0000 g | PACK | Freq: Every day | ORAL | Status: DC
  Filled 2022-04-24 (×3): qty 1

## 2022-04-24 MED ORDER — MIRTAZAPINE 15 MG PO TABS
15.0000 mg | ORAL_TABLET | Freq: Every day | ORAL | Status: DC
  Administered 2022-04-24 – 2022-04-25 (×2): 15 mg via ORAL
  Filled 2022-04-24 (×2): qty 1

## 2022-04-24 MED ORDER — GABAPENTIN 300 MG PO CAPS
300.0000 mg | ORAL_CAPSULE | Freq: Two times a day (BID) | ORAL | Status: DC
  Administered 2022-04-24 – 2022-04-26 (×5): 300 mg via ORAL
  Filled 2022-04-24 (×5): qty 1

## 2022-04-24 MED ORDER — LINACLOTIDE 145 MCG PO CAPS
145.0000 ug | ORAL_CAPSULE | Freq: Every day | ORAL | Status: DC
  Administered 2022-04-24 – 2022-04-26 (×3): 145 ug via ORAL
  Filled 2022-04-24 (×3): qty 1

## 2022-04-24 MED ORDER — LACTATED RINGERS IV SOLN: INTRAVENOUS | Status: AC

## 2022-04-24 MED ORDER — ORAL CARE MOUTH RINSE: 15.0000 mL | OROMUCOSAL | Status: DC | PRN

## 2022-04-24 MED ORDER — FUROSEMIDE 20 MG PO TABS: 20.0000 mg | ORAL_TABLET | Freq: Every day | ORAL | Status: DC

## 2022-04-24 MED ORDER — ONDANSETRON HCL 4 MG/2ML IJ SOLN: 4.0000 mg | Freq: Four times a day (QID) | INTRAMUSCULAR | Status: DC | PRN

## 2022-04-24 MED ORDER — PANTOPRAZOLE SODIUM 40 MG PO TBEC
40.0000 mg | DELAYED_RELEASE_TABLET | Freq: Every day | ORAL | Status: DC
  Administered 2022-04-24 – 2022-04-26 (×3): 40 mg via ORAL
  Filled 2022-04-24 (×3): qty 1

## 2022-04-24 MED ORDER — RIFAXIMIN 550 MG PO TABS
550.0000 mg | ORAL_TABLET | Freq: Two times a day (BID) | ORAL | Status: DC
  Administered 2022-04-24 – 2022-04-26 (×5): 550 mg via ORAL
  Filled 2022-04-24 (×6): qty 1

## 2022-04-24 MED ORDER — GUAIFENESIN 100 MG/5ML PO LIQD
5.0000 mL | ORAL | Status: DC | PRN
  Administered 2022-04-24: 5 mL via ORAL
  Filled 2022-04-24: qty 15

## 2022-04-24 MED ORDER — POTASSIUM CHLORIDE CRYS ER 20 MEQ PO TBCR
40.0000 meq | EXTENDED_RELEASE_TABLET | Freq: Two times a day (BID) | ORAL | Status: AC
  Administered 2022-04-24 (×2): 40 meq via ORAL
  Filled 2022-04-24 (×2): qty 2

## 2022-04-24 MED ORDER — METOPROLOL TARTRATE 5 MG/5ML IV SOLN
5.0000 mg | Freq: Two times a day (BID) | INTRAVENOUS | Status: DC
  Administered 2022-04-24 – 2022-04-25 (×4): 5 mg via INTRAVENOUS
  Filled 2022-04-24 (×5): qty 5

## 2022-04-24 MED ORDER — INSULIN GLARGINE-YFGN 100 UNIT/ML ~~LOC~~ SOLN
25.0000 [IU] | Freq: Every day | SUBCUTANEOUS | Status: DC
  Administered 2022-04-24: 25 [IU] via SUBCUTANEOUS
  Filled 2022-04-24 (×2): qty 0.25

## 2022-04-24 MED ORDER — CLONAZEPAM 0.5 MG PO TABS
0.5000 mg | ORAL_TABLET | Freq: Every day | ORAL | Status: DC
  Administered 2022-04-24 – 2022-04-25 (×2): 0.5 mg via ORAL
  Filled 2022-04-24 (×2): qty 1
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
## 2022-04-24 NOTE — ED Notes (Signed)
Report given to [facility] at bedside.

## 2022-04-24 NOTE — Progress Notes (Signed)
Pharmacy Antibiotic Note  Katherine [name] is a 67 y.o. female admitted on 04/23/2022 with severe sepsis, aspiration pneumonia.  Pharmacy has been consulted for Unasyn dosing aspiration pneumonia .  Plan: Unasyn 3g IV q6h Monitor clinical status, renal function and culture results daily.   Height: 5' 6"$  (167.6 cm) Weight: 134 kg (295 lb 6.7 oz) IBW/kg (Calculated) : 59.3  Temp (24hrs), Avg:98.5 F (36.9 C), Min:98 F (36.7 C), Max:100.2 F (37.9 C)  Recent Labs  Lab 04/23/22 1915 04/23/22 2321 04/24/22 0401 04/24/22 0710 04/24/22 0824  WBC 19.0*  --  13.7*  --   --   CREATININE 1.23*  --  1.21*  --   --   LATICACIDVEN  --  1.8 2.5* 1.4 1.8    Estimated Creatinine Clearance: 64.4 mL/min (A) (by C-G formula based on SCr of 1.21 mg/dL (H)).    Allergies  Allergen Reactions   Adhesive [Tape] Other (See Comments)    BLISTERS   Morphine And Related Other (See Comments)    The pills causes hallucinations   Morphine Sulfate     Other reaction(s): hallucinations Other reaction(s): hallucinations   Other     Other reaction(s): blisters    Antimicrobials this admission: Unasyn 2/22  >>    Dose adjustments this admission: n/a   Microbiology results: 2/22 BCx:  sent 2/21 Covid-19 PCR: neg;   flu a/b pcr: neg;  RSV pcr: neg      Thank you for allowing pharmacy to be a part of this patient's care. Nicole Cella, RPh Clinical Pharmacist 04/24/2022 1:29 PM
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
## 2022-04-24 NOTE — Progress Notes (Signed)
New Admission Note:   Arrival Method: Via Stret[facility] with [facility] - From [facility] Mental Orientation:  A & O x4 Telemetry: Box 5M19 - NSR Assessment: Completed Skin:  See skin assessment on flow sheet IV:  Left AC Pain: Denies Tubes:  [place] Safety Measures: Placed on Bed Alarm - Had fall at home last week. Admission: Completed 5 MW Orientation: Patient has been orientated to the room, unit and staff.  Family:  From home with son - no family at bedside  Patient made aware of [place] - She had her clothes and cell phone with her (No charger).  She left her glasses at home.  Orders have been reviewed and implemented. Will continue to monitor the patient. Call light has been placed within reach and bed alarm has been activated.   Earleen Reaper RN- BC, [facility] Phone number: [phone]

## 2022-04-24 NOTE — Plan of Care (Signed)
  Problem: Clinical Measurements: Goal: Respiratory complications will improve Outcome: Progressing Goal: Cardiovascular complication will be avoided Outcome: Progressing   Problem: Activity: Goal: Risk for activity intolerance will decrease Outcome: Progressing   Problem: Coping: Goal: Level of anxiety will decrease Outcome: Progressing   Problem: Elimination: Goal: Will not experience complications related to bowel motility Outcome: Progressing Goal: Will not experience complications related to urinary retention Outcome: Progressing   Problem: Pain Managment: Goal: General experience of comfort will improve Outcome: Progressing

## 2022-04-24 NOTE — Progress Notes (Signed)
   04/24/22 0535  Provider Notification  Provider Name/Title Dr. Nevada Crane  Date Provider Notified 04/24/22  Time Provider Notified [phone]  Method of Notification Page  Notification Reason Critical Result  Test performed and critical result Lactic Acid 2.5  Date Critical Result Received 04/24/22  Time Critical Result Received [id number]   Received Critical Lactic Acid of 2.5.  Dr. Nevada Crane made aware.  Will continue to monitor patient.  Earleen Reaper RN

## 2022-04-24 NOTE — Progress Notes (Signed)
Patient refused bed alarm. Patient advised to call for any assistance out of bed. Verbalizes understanding.Call bell within reach.

## 2022-04-25 ENCOUNTER — Inpatient Hospital Stay (HOSPITAL_COMMUNITY): Payer: Medicare Other

## 2022-04-25 DIAGNOSIS — R112 Nausea with vomiting, unspecified: Secondary | ICD-10-CM | POA: Diagnosis not present

## 2022-04-25 DIAGNOSIS — J189 Pneumonia, unspecified organism: Secondary | ICD-10-CM | POA: Diagnosis not present

## 2022-04-25 DIAGNOSIS — I509 Heart failure, unspecified: Secondary | ICD-10-CM

## 2022-04-25 DIAGNOSIS — I10 Essential (primary) hypertension: Secondary | ICD-10-CM | POA: Diagnosis not present

## 2022-04-25 DIAGNOSIS — J9601 Acute respiratory failure with hypoxia: Secondary | ICD-10-CM | POA: Diagnosis not present

## 2022-04-25 DIAGNOSIS — K7030 Alcoholic cirrhosis of liver without ascites: Secondary | ICD-10-CM

## 2022-04-25 LAB — COMPREHENSIVE METABOLIC PANEL
ALT: 26 U/L (ref 0–44)
AST: 51 U/L — ABNORMAL HIGH (ref 15–41)
Albumin: 2.5 g/dL — ABNORMAL LOW (ref 3.5–5.0)
Alkaline Phosphatase: 67 U/L (ref 38–126)
Anion gap: 11 (ref 5–15)
BUN: 19 mg/dL (ref 8–23)
CO2: 24 mmol/L (ref 22–32)
Calcium: 8.9 mg/dL (ref 8.9–10.3)
Chloride: 102 mmol/L (ref 98–111)
Creatinine, Ser: 1.17 mg/dL — ABNORMAL HIGH (ref 0.44–1.00)
GFR, Estimated: 51 mL/min — ABNORMAL LOW (ref >60)
Glucose, Bld: 163 mg/dL — ABNORMAL HIGH (ref 70–99)
Potassium: 3.5 mmol/L (ref 3.5–5.1)
Sodium: 137 mmol/L (ref 135–145)
Total Bilirubin: 1.1 mg/dL (ref 0.3–1.2)
Total Protein: 7.2 g/dL (ref 6.5–8.1)

## 2022-04-25 LAB — ECHOCARDIOGRAM COMPLETE
AR max vel: 2.34 cm2
AV Area VTI: 2.26 cm2
AV Area mean vel: 2.31 cm2
AV Mean grad: 6 mmHg
AV Peak grad: 11.7 mmHg
Ao pk vel: 1.71 m/s
Area-P 1/2: 4.31 cm2
Calc EF: 58.4 %
Height: 66 in
MV M vel: 2.89 m/s
MV Peak grad: 33.5 mmHg
S' Lateral: 3.8 cm
Single Plane A2C EF: 56.4 %
Single Plane A4C EF: 58.4 %
Weight: 4726.66 oz

## 2022-04-25 LAB — CBC
HCT: 41.7 % (ref 36.0–46.0)
Hemoglobin: 13.5 g/dL (ref 12.0–15.0)
MCH: 27.4 pg (ref 26.0–34.0)
MCHC: 32.4 g/dL (ref 30.0–36.0)
MCV: 84.8 fL (ref 80.0–100.0)
Platelets: 103 10*3/uL — ABNORMAL LOW (ref 150–400)
RBC: 4.92 MIL/uL (ref 3.87–5.11)
RDW: 14 % (ref 11.5–15.5)
WBC: 8.3 10*3/uL (ref 4.0–10.5)
nRBC: 0 % (ref 0.0–0.2)

## 2022-04-25 LAB — GLUCOSE, CAPILLARY
Glucose-Capillary: 76 mg/dL (ref 70–99)
Glucose-Capillary: 120 mg/dL — ABNORMAL HIGH (ref 70–99)
Glucose-Capillary: 106 mg/dL — ABNORMAL HIGH (ref 70–99)
Glucose-Capillary: 186 mg/dL — ABNORMAL HIGH (ref 70–99)

## 2022-04-25 LAB — LEGIONELLA PNEUMOPHILA SEROGP 1 UR AG: L. pneumophila Serogp 1 Ur Ag: NEGATIVE

## 2022-04-25 MED ORDER — GERHARDT'S BUTT CREAM
TOPICAL_CREAM | CUTANEOUS | Status: DC | PRN
  Administered 2022-04-25: 1 via TOPICAL
  Filled 2022-04-25: qty 1

## 2022-04-25 MED ORDER — ENOXAPARIN SODIUM 40 MG/0.4ML IJ SOSY
40.0000 mg | PREFILLED_SYRINGE | INTRAMUSCULAR | Status: DC
  Administered 2022-04-25 – 2022-04-26 (×2): 40 mg via SUBCUTANEOUS
  Filled 2022-04-25 (×2): qty 0.4

## 2022-04-25 MED ORDER — FUROSEMIDE 20 MG PO TABS
20.0000 mg | ORAL_TABLET | Freq: Every day | ORAL | Status: DC
  Administered 2022-04-25 – 2022-04-26 (×2): 20 mg via ORAL
  Filled 2022-04-25 (×2): qty 1

## 2022-04-25 MED ORDER — INSULIN GLARGINE-YFGN 100 UNIT/ML ~~LOC~~ SOLN
20.0000 [IU] | Freq: Every day | SUBCUTANEOUS | Status: DC
  Administered 2022-04-26: 20 [IU] via SUBCUTANEOUS
  Filled 2022-04-25: qty 0.2

## 2022-04-25 MED ORDER — LACTULOSE 10 GM/15ML PO SOLN
20.0000 g | Freq: Every day | ORAL | Status: DC
  Administered 2022-04-26: 20 g via ORAL
  Filled 2022-04-25: qty 30

## 2022-04-25 MED ORDER — SPIRONOLACTONE 25 MG PO TABS
50.0000 mg | ORAL_TABLET | Freq: Every day | ORAL | Status: DC
  Administered 2022-04-25 – 2022-04-26 (×2): 50 mg via ORAL
  Filled 2022-04-25 (×2): qty 2

## 2022-04-25 MED ORDER — PERFLUTREN LIPID MICROSPHERE
1.0000 mL | INTRAVENOUS | Status: AC | PRN
  Administered 2022-04-25: 2 mL via INTRAVENOUS

## 2022-04-25 NOTE — Evaluation (Signed)
Physical Therapy Evaluation Patient Details Name: Katherine [name] MRN: [id number] DOB: 08-09-1955 Today's Date: 04/25/2022  History of Present Illness  Katherine [name] is a 67 y.o. female admitted 2/21 with Acute hypoxic respiratory failure  Severe sepsis  Probable aspiration pneumonia. PMHx: NASH liver cirrhosis, DM-2, HTN, chronic pain on oxycodone  Clinical Impression  Pt admitted with above diagnosis. SpO2 at rest on 1L 97%, at rest on RA 94%, ambulating on RA 87% at end of distance, quickly resolved with 1L back into 90s. Transfers and ambulates with RW at supervision level. Mildly unstable without assistive device, reports 1 fall at home about a month ago. Agreeable to HHPT for follow-up. Lives with son who is available PRN.  Pt currently with functional limitations due to the deficits listed below (see PT Problem List). Pt will benefit from skilled PT to increase their independence and safety with mobility to allow discharge to the venue listed below.          Recommendations for follow up therapy are one component of a multi-disciplinary discharge planning process, led by the attending physician.  Recommendations may be updated based on patient status, additional functional criteria and insurance authorization.  Follow Up Recommendations Home health PT      Assistance Recommended at Discharge PRN  Patient can return home with the following  Help with stairs or ramp for entrance;Assist for transportation    Equipment Recommendations None recommended by PT  Recommendations for Other Services       Functional Status Assessment Patient has had a recent decline in their functional status and demonstrates the ability to make significant improvements in function in a reasonable and predictable amount of time.     Precautions / Restrictions Precautions Precautions: Fall Precaution Comments: monitor O2 Restrictions Weight Bearing Restrictions: No      Mobility  Bed  Mobility Overal bed mobility: Modified Independent             General bed mobility comments: extra time    Transfers Overall transfer level: Needs assistance Equipment used: Rolling walker (2 wheels) Transfers: Sit to/from Stand, Bed to chair/wheelchair/BSC Sit to Stand: Supervision Stand pivot transfers: Supervision         General transfer comment: Supervision for safety, performed from bed and BSC. Stable with use of RW for support.    Ambulation/Gait Ambulation/Gait assistance: Supervision Gait Distance (Feet): 100 Feet Assistive device: Rolling walker (2 wheels), None Gait Pattern/deviations: Step-through pattern, Decreased stride length, Wide base of support Gait velocity: decr Gait velocity interpretation: <1.8 ft/sec, indicate of risk for recurrent falls   General Gait Details: Educated on safe AD use with RW for light support. Short distance in room without demonstrates increased sway without overt LOB, furniture walking. In hallway pt uses RW with grossly normal stride, no evidence of buckling, slower gait speed. SPO2 dropped to 87% at end of distance on RA.  Stairs            Wheelchair Mobility    Modified Rankin (Stroke Patients Only)       Balance Overall balance assessment: Mild deficits observed, not formally tested                                           Pertinent Vitals/Pain Pain Assessment Pain Assessment: No/denies pain    Home Living Family/patient expects to be discharged to:: Private residence Living Arrangements:  Children Available Help at Discharge: Family;Available PRN/intermittently Type of Home: Mobile home Home Access: Stairs to enter Entrance Stairs-Rails: Right Entrance Stairs-Number of Steps: 5   Home Layout: One level Home Equipment: Cane - single point;Rollator (4 wheels);Shower seat      Prior Function Prior Level of Function : History of Falls (last six months);Independent/Modified  Independent             Mobility Comments: uses cane outdoors, rollator indoors. One fall a month ago.       Hand Dominance   Dominant Hand: Right    Extremity/Trunk Assessment   Upper Extremity Assessment Upper Extremity Assessment: Defer to OT evaluation    Lower Extremity Assessment Lower Extremity Assessment: Generalized weakness       Communication   Communication: No difficulties  Cognition Arousal/Alertness: Awake/alert Behavior During Therapy: WFL for tasks assessed/performed Overall Cognitive Status: Within Functional Limits for tasks assessed                                          General Comments General comments (skin integrity, edema, etc.): SpO2 at rest on RA 94%, ambulating on RA 87%, at rest on 1L 97%    Exercises     Assessment/Plan    PT Assessment Patient needs continued PT services  PT Problem List Decreased strength;Decreased activity tolerance;Decreased balance;Decreased mobility;Decreased knowledge of use of DME;Cardiopulmonary status limiting activity;Obesity       PT Treatment Interventions DME instruction;Gait training;Stair training;Functional mobility training;Therapeutic activities;Therapeutic exercise;Balance training;Neuromuscular re-education;Patient/family education    PT Goals (Current goals can be found in the Care Plan section)  Acute Rehab PT Goals Patient Stated Goal: get well, go home PT Goal Formulation: With patient Time For Goal Achievement: 05/09/22 Potential to Achieve Goals: Good    Frequency Min 3X/week     Co-evaluation               AM-PAC PT "6 Clicks" Mobility  Outcome Measure Help needed turning from your back to your side while in a flat bed without using bedrails?: None Help needed moving from lying on your back to sitting on the side of a flat bed without using bedrails?: None Help needed moving to and from a bed to a chair (including a wheelchair)?: A Little Help needed  standing up from a chair using your arms (e.g., wheelchair or bedside chair)?: A Little Help needed to walk in hospital room?: A Little Help needed climbing 3-5 steps with a railing? : A Little 6 Click Score: 20    End of Session Equipment Utilized During Treatment: Gait belt;Oxygen Activity Tolerance: Patient tolerated treatment well Patient left: in chair;with call bell/phone within reach   PT Visit Diagnosis: Other abnormalities of gait and mobility (R26.89);Unsteadiness on feet (R26.81);History of falling (Z91.81);Muscle weakness (generalized) (M62.81)    Time: [id number] PT Time Calculation (min) (ACUTE ONLY): 27 min   Charges:   PT Evaluation $PT Eval Low Complexity: 1 Low PT Treatments $Gait Training: 8-22 mins        Candie Mile, PT, DPT Physical Therapist Acute Rehabilitation Services [place]   Ellouise Newer 04/25/2022, 10:08 AM

## 2022-04-25 NOTE — Progress Notes (Signed)
  Echocardiogram 2D Echocardiogram has been performed.  Katherine [name] 04/25/2022, 8:28 AM
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
## 2022-04-25 NOTE — Progress Notes (Signed)
Patient states IV was "burning". Stopped antibiotics. Removed IV and assess area. Small bulge observed right above IV site. IV team contacted to set a new one.

## 2022-04-25 NOTE — Progress Notes (Addendum)
PROGRESS NOTE        PATIENT DETAILS Name: Katherine [name] Age: 67 y.o. Sex: female Date of Birth: 11-02-1955 Admit Date: 04/23/2022 Admitting Physician Kayleen Memos, DO UH:[id number], Nathen May, MD  Brief Summary: Katherine [name] is a 67 y.o. female with medical history significant of NASH liver cirrhosis, DM-2, HTN, chronic pain on oxycodone who presented to [place] [facility] on 2/21 with 2-3-day history of weakness, coughing, vomiting-found to have acute hypoxic respiratory failure with sepsis physiology due to aspiration pneumonia.   Significant events: 2/21>> admit to [facility]  Significant studies: 2/21>> CXR: Right-sided perihilar bronchopneumonia-upper lobe. 2/22>> echo: EF 60-65%  Significant microbiology data: 2/21>> COVID/influenza/RSV PCR: Negative 2/21 blood culture: Negative  Procedures: None  Consults: None  Subjective: Feels much better.  Still requiring around 2 L of oxygen.  No vomiting.  Tolerating diet.  Objective: Vitals: Blood pressure (!) 139/96, pulse 75, temperature 98.2 F (36.8 C), temperature source Oral, resp. rate 18, height '5\' 6"'$  (1.676 m), weight 134 kg, SpO2 94 %.   Exam: Gen Exam:Alert awake-not in any distress HEENT:atraumatic, normocephalic Chest: B/L clear to auscultation anteriorly CVS:S1S2 regular Abdomen:soft non tender, non distended Extremities:no edema Neurology: Non focal Skin: no rash  Pertinent Labs/Radiology:    Latest Ref Rng & Units 04/25/2022   10:17 AM 04/24/2022    4:01 AM 04/23/2022    7:15 PM  CBC  WBC 4.0 - 10.5 K/uL 8.3  13.7  19.0   Hemoglobin 12.0 - 15.0 g/dL 13.5  12.4  13.7   Hematocrit 36.0 - 46.0 % 41.7  38.2  41.5   Platelets 150 - 400 K/uL 103  61  75     Lab Results  Component Value Date   NA 137 04/25/2022   K 3.5 04/25/2022   CL 102 04/25/2022   CO2 24 04/25/2022      Assessment/Plan: Acute hypoxic respiratory failure Severe sepsis Probable aspiration  pneumonia Improved-feels better-still on 2 L of oxygen Continue Unasyn x 5 days Continue Zithromax x 3 days Appreciate SLP eval-no overt aspiration issues.   Follow-up culture data.   Titrate down to FiO2  Intractable nausea/vomiting Unclear if this was from pneumonia or viral syndrome that caused her to have nausea/vomiting Abdominal exam was benign-no clinical suggestion of SBO Vomiting has resolved-she is tolerating advancement in diet. Antiemetics as needed.   Hypokalemia Repleted   AKI Mild Likely hemodynamically mediated to nausea/vomiting Improved with supportive care Continue supportive care   NASH liver cirrhosis Appears well compensated Mentation is stable No evidence of ascites/significant edema on exam Continue lactulose/rifaximin Will attempt to resume diuretics today given improvement in renal function  Thrombocytopenia Appears to be a chronic issue Likely due to hypersplenism in the setting of liver cirrhosis Probably worsened due to PNA/sepsis-but now getting better Follow CBC   DM-2 (A1c 6.4 on 2/22) Borderline hypoglycemic this morning-decrease 20 units Continue SSI Follow/optimize    Recent Labs    04/24/22 2034 04/25/22 0802 04/25/22 1105  GLUCAP 135* 76 120*     HTN BP stable Appears to be controlled just on diuretics   HLD Statin   Hypothyroidism Synthroid   Mood disorder Slightly anxious Continue Remeron/Cymbalta/Klonopin   Chronic pain Neurontin As needed oxycodone  Debility/deconditioning Due to acute illness PT/OT eval   MorbidObesity: Estimated body mass index is 47.68 kg/m as calculated from  the following:   Height as of this encounter: '5\' 6"'$  (1.676 m).   Weight as of this encounter: 134 kg.   Code status:   Code Status: Full Code (Was DNR on 2/22-but meant that she did not want LONGTERM vent support)  DVT Prophylaxis: since platelet count better start Prophylactic Lovenox 2/23-monitor CBC enoxaparin  (LOVENOX) injection 40 mg Start: 04/25/22 1330  Family Communication: None at bedside   Disposition Plan: Status is: Inpatient Remains inpatient appropriate because: Severity of illness   Planned Discharge Destination:Home   Diet: Diet Order             Diet Carb Modified Fluid consistency: Thin; Room service appropriate? Yes  Diet effective now                     Antimicrobial agents: Anti-infectives (From admission, onward)    Start     Dose/Rate Route Frequency Ordered Stop   04/24/22 2200  azithromycin (ZITHROMAX) 500 mg in sodium chloride 0.9 % 250 mL IVPB  Status:  Discontinued        500 mg 250 mL/hr over 60 Minutes Intravenous Every 24 hours 04/24/22 0309 04/24/22 1717   04/24/22 2200  azithromycin (ZITHROMAX) 500 mg in sodium chloride 0.9 % 250 mL IVPB        500 mg 250 mL/hr over 60 Minutes Intravenous Every 24 hours 04/24/22 1717 04/27/22 2159   04/24/22 1000  cefTRIAXone (ROCEPHIN) 2 g in sodium chloride 0.9 % 100 mL IVPB  Status:  Discontinued        2 g 200 mL/hr over 30 Minutes Intravenous Every 24 hours 04/24/22 0309 04/24/22 0741   04/24/22 1000  rifaximin (XIFAXAN) tablet 550 mg        550 mg Oral 2 times daily 04/24/22 0657     04/24/22 0845  Ampicillin-Sulbactam (UNASYN) 3 g in sodium chloride 0.9 % 100 mL IVPB        3 g 200 mL/hr over 30 Minutes Intravenous Every 6 hours 04/24/22 0754     04/23/22 2300  cefTRIAXone (ROCEPHIN) 1 g in sodium chloride 0.9 % 100 mL IVPB        1 g 200 mL/hr over 30 Minutes Intravenous  Once 04/23/22 2255 04/24/22 0134   04/23/22 2300  azithromycin (ZITHROMAX) 500 mg in sodium chloride 0.9 % 250 mL IVPB        500 mg 250 mL/hr over 60 Minutes Intravenous  Once 04/23/22 2255 04/24/22 0134        MEDICATIONS: Scheduled Meds:  clonazePAM  0.5 mg Oral QHS   DULoxetine  60 mg Oral Daily   enoxaparin (LOVENOX) injection  40 mg Subcutaneous Q24H   furosemide  20 mg Oral Daily   gabapentin  300 mg Oral BID    insulin aspart  0-15 Units Subcutaneous TID WC   insulin aspart  0-5 Units Subcutaneous QHS   [START ON 04/26/2022] insulin glargine-yfgn  20 Units Subcutaneous Daily   lactulose  20 g Oral BID   levothyroxine  25 mcg Oral QAC breakfast   linaclotide  145 mcg Oral QAC breakfast   metoprolol tartrate  5 mg Intravenous Q12H   mirtazapine  15 mg Oral QHS   pantoprazole  40 mg Oral Daily   polyethylene glycol  17 g Oral Daily   rifaximin  550 mg Oral BID   rosuvastatin  40 mg Oral QHS   sodium chloride flush  3 mL Intravenous Q12H   spironolactone  50 mg Oral Daily   Continuous Infusions:  sodium chloride Stopped (04/25/22 0603)   ampicillin-sulbactam (UNASYN) IV 3 g (04/25/22 0846)   azithromycin Stopped (04/25/22 0236)   PRN Meds:.sodium chloride, acetaminophen **OR** acetaminophen, albuterol, guaiFENesin, melatonin, ondansetron **OR** ondansetron (ZOFRAN) IV, mouth rinse, oxyCODONE   I have personally reviewed following labs and imaging studies  LABORATORY DATA: CBC: Recent Labs  Lab 04/23/22 1915 04/24/22 0401 04/25/22 1017  WBC 19.0* 13.7* 8.3  NEUTROABS 17.7* 11.0*  --   HGB 13.7 12.4 13.5  HCT 41.5 38.2 41.7  MCV 83.3 86.2 84.8  PLT 75* 61* 103*    Basic Metabolic Panel: Recent Labs  Lab 04/23/22 1915 04/24/22 0401 04/25/22 1017  NA 131* 135 137  K 3.3* 3.1* 3.5  CL 92* 95* 102  CO2 '28 27 24  '$ GLUCOSE 193* 126* 163*  BUN 25* 23 19  CREATININE 1.23* 1.21* 1.17*  CALCIUM 10.1 9.1 8.9    GFR: Estimated Creatinine Clearance: 66.6 mL/min (A) (by C-G formula based on SCr of 1.17 mg/dL (H)).  Liver Function Tests: Recent Labs  Lab 04/23/22 1915 04/25/22 1017  AST 43* 51*  ALT 21 26  ALKPHOS 73 67  BILITOT 2.1* 1.1  PROT 7.7 7.2  ALBUMIN 3.3* 2.5*   No results for input(s): "LIPASE", "AMYLASE" in the last 168 hours. No results for input(s): "AMMONIA" in the last 168 hours.  Coagulation Profile: No results for input(s): "INR", "PROTIME" in the last  168 hours.  Cardiac Enzymes: No results for input(s): "CKTOTAL", "CKMB", "CKMBINDEX", "TROPONINI" in the last 168 hours.  BNP (last 3 results) No results for input(s): "PROBNP" in the last 8760 hours.  Lipid Profile: No results for input(s): "CHOL", "HDL", "LDLCALC", "TRIG", "CHOLHDL", "LDLDIRECT" in the last 72 hours.  Thyroid Function Tests: No results for input(s): "TSH", "T4TOTAL", "FREET4", "T3FREE", "THYROIDAB" in the last 72 hours.  Anemia Panel: No results for input(s): "VITAMINB12", "FOLATE", "FERRITIN", "TIBC", "IRON", "RETICCTPCT" in the last 72 hours.  Urine analysis:    Component Value Date/Time   COLORURINE YELLOW 04/23/2022 2321   APPEARANCEUR CLEAR 04/23/2022 2321   LABSPEC 1.012 04/23/2022 2321   PHURINE 5.5 04/23/2022 2321   GLUCOSEU >1,000 (A) 04/23/2022 2321   HGBUR TRACE (A) 04/23/2022 2321   BILIRUBINUR NEGATIVE 04/23/2022 2321   KETONESUR NEGATIVE 04/23/2022 2321   PROTEINUR TRACE (A) 04/23/2022 2321   UROBILINOGEN 1.0 11/21/2014 1955   NITRITE NEGATIVE 04/23/2022 2321   LEUKOCYTESUR TRACE (A) 04/23/2022 2321    Sepsis Labs: Lactic Acid, Venous    Component Value Date/Time   LATICACIDVEN 1.8 04/24/2022 0824    MICROBIOLOGY: Recent Results (from the past 240 hour(s))  Resp panel by RT-PCR (RSV, Flu A&B, Covid) Anterior Nasal Swab     Status: None   Collection Time: 04/23/22  7:15 PM   Specimen: Anterior Nasal Swab  Result Value Ref Range Status   SARS Coronavirus 2 by RT PCR NEGATIVE NEGATIVE Final    Comment: (NOTE) SARS-CoV-2 target nucleic acids are NOT DETECTED.  The SARS-CoV-2 RNA is generally detectable in upper respiratory specimens during the acute phase of infection. The lowest concentration of SARS-CoV-2 viral copies this assay can detect is 138 copies/mL. A negative result does not preclude SARS-Cov-2 infection and should not be used as the sole basis for treatment or other patient management decisions. A negative result may  occur with  improper specimen collection/handling, submission of specimen other than nasopharyngeal swab, presence of viral mutation(s) within the areas targeted by this  assay, and inadequate number of viral copies(<138 copies/mL). A negative result must be combined with clinical observations, patient history, and epidemiological information. The expected result is Negative.  Fact Sheet for Patients:  EntrepreneurPulse.com.au  Fact Sheet for Healthcare Providers:  IncredibleEmployment.be  This test is no t yet approved or cleared by the [place] FDA and  has been authorized for detection and/or diagnosis of SARS-CoV-2 by FDA under an Emergency Use Authorization (EUA). This EUA will remain  in effect (meaning this test can be used) for the duration of the COVID-19 declaration under Section 564(b)(1) of the Act, 21 U.S.C.section 360bbb-3(b)(1), unless the authorization is terminated  or revoked sooner.       Influenza A by PCR NEGATIVE NEGATIVE Final   Influenza B by PCR NEGATIVE NEGATIVE Final    Comment: (NOTE) The Xpert Xpress SARS-CoV-2/FLU/RSV plus assay is intended as an aid in the diagnosis of influenza from Nasopharyngeal swab specimens and should not be used as a sole basis for treatment. Nasal washings and aspirates are unacceptable for Xpert Xpress SARS-CoV-2/FLU/RSV testing.  Fact Sheet for Patients: EntrepreneurPulse.com.au  Fact Sheet for Healthcare Providers: IncredibleEmployment.be  This test is not yet approved or cleared by the [place] FDA and has been authorized for detection and/or diagnosis of SARS-CoV-2 by FDA under an Emergency Use Authorization (EUA). This EUA will remain in effect (meaning this test can be used) for the duration of the COVID-19 declaration under Section 564(b)(1) of the Act, 21 U.S.C. section 360bbb-3(b)(1), unless the authorization is terminated  or revoked.     Resp Syncytial Virus by PCR NEGATIVE NEGATIVE Final    Comment: (NOTE) Fact Sheet for Patients: EntrepreneurPulse.com.au  Fact Sheet for Healthcare Providers: IncredibleEmployment.be  This test is not yet approved or cleared by the [place] FDA and has been authorized for detection and/or diagnosis of SARS-CoV-2 by FDA under an Emergency Use Authorization (EUA). This EUA will remain in effect (meaning this test can be used) for the duration of the COVID-19 declaration under Section 564(b)(1) of the Act, 21 U.S.C. section 360bbb-3(b)(1), unless the authorization is terminated or revoked.  Performed at [facility], [address], [place], [place] [address]   Culture, blood (routine x 2)     Status: None (Preliminary result)   Collection Time: 04/23/22 11:22 PM   Specimen: BLOOD  Result Value Ref Range Status   Specimen Description   Final    BLOOD Performed at [facility], [address], [place], [place] [address]    Special Requests   Final    NONE Performed at [facility], [address] [place], [place] [address]    Culture   Final    NO GROWTH < 24 HOURS Performed at [facility] [address], [place], [place] [address]    Report Status PENDING  Incomplete  Culture, blood (routine x 2)     Status: None (Preliminary result)   Collection Time: 04/24/22  3:59 AM   Specimen: BLOOD LEFT HAND  Result Value Ref Range Status   Specimen Description BLOOD LEFT HAND  Final   Special Requests   Final    BOTTLES DRAWN AEROBIC AND ANAEROBIC Blood Culture adequate volume   Culture   Final    NO GROWTH 1 DAY Performed at [facility], [place] [address], [place], [place] [address]    Report Status PENDING  Incomplete    RADIOLOGY STUDIES/RESULTS: ECHOCARDIOGRAM COMPLETE  Result Date: 04/25/2022  ECHOCARDIOGRAM REPORT   Patient Name:    AZAILA HASBERRY Date of Exam: 04/25/2022 Medical Rec #:  [id number]         Height:       66.0 in Accession #:    [id number]        Weight:       295.4 lb Date of Birth:  23-Jun-1955          BSA:          2.360 m Patient Age:    3 years          BP:           114/57 mmHg Patient Gender: F                 HR:           73 bpm. Exam Location:  Inpatient Procedure: 2D Echo and Intracardiac Opacification Agent Indications:    CHF  History:        Patient has prior history of Echocardiogram examinations, most                 recent 09/08/2012. Risk Factors:Diabetes, Dyslipidemia and                 Hypertension.  Sonographer:    Harvie Junior Referring Phys: Samella Parr  Sonographer Comments: Technically difficult study due to poor echo windows and patient is obese. Image acquisition challenging due to patient body habitus. IMPRESSIONS  1. Left ventricular ejection fraction, by estimation, is 60 to 65%. The left ventricle has normal function. The left ventricle has no regional wall motion abnormalities. Left ventricular diastolic parameters were normal.  2. Right ventricular systolic function is normal. The right ventricular size is normal. Tricuspid regurgitation signal is inadequate for assessing PA pressure.  3. The mitral valve is degenerative. Trivial mitral valve regurgitation. No evidence of mitral stenosis.  4. The aortic valve is tricuspid. There is mild calcification of the aortic valve. There is mild thickening of the aortic valve. Aortic valve regurgitation is not visualized. Aortic valve sclerosis/calcification is present, without any evidence of aortic stenosis.  5. The inferior vena cava is normal in size with greater than 50% respiratory variability, suggesting right atrial pressure of 3 mmHg. FINDINGS  Left Ventricle: Left ventricular ejection fraction, by estimation, is 60 to 65%. The left ventricle has normal function. The left ventricle has no regional wall motion abnormalities. Definity contrast  agent was given IV to delineate the left ventricular  endocardial borders. 3D left ventricular ejection fraction analysis performed but not reported based on interpreter judgement due to suboptimal tracking. The left ventricular internal cavity size was normal in size. There is no left ventricular hypertrophy. Left ventricular diastolic parameters were normal. Right Ventricle: The right ventricular size is normal. No increase in right ventricular wall thickness. Right ventricular systolic function is normal. Tricuspid regurgitation signal is inadequate for assessing PA pressure. Left Atrium: Left atrial size was normal in size. Right Atrium: Right atrial size was normal in size. Pericardium: There is no evidence of pericardial effusion. Mitral Valve: The mitral valve is degenerative in appearance. There is mild thickening of the mitral valve leaflet(s). There is mild calcification of the mitral valve leaflet(s). Trivial mitral valve regurgitation. No evidence of mitral valve stenosis. Tricuspid Valve: The tricuspid valve is normal in structure. Tricuspid valve regurgitation is trivial. Aortic Valve: The aortic valve is tricuspid. There is mild calcification of the aortic valve. There is mild thickening  of the aortic valve. Aortic valve regurgitation is not visualized. Aortic valve sclerosis/calcification is present, without any evidence of aortic stenosis. Aortic valve mean gradient measures 6.0 mmHg. Aortic valve peak gradient measures 11.7 mmHg. Aortic valve area, by VTI measures 2.26 cm. Pulmonic Valve: The pulmonic valve was not well visualized. Pulmonic valve regurgitation is not visualized. Aorta: The aortic root is normal in size and structure. Venous: The inferior vena cava is normal in size with greater than 50% respiratory variability, suggesting right atrial pressure of 3 mmHg. IAS/Shunts: The atrial septum is grossly normal.  LEFT VENTRICLE PLAX 2D LVIDd:         5.20 cm      Diastology LVIDs:          3.80 cm      LV e' medial:    10.00 cm/s LV PW:         0.90 cm      LV E/e' medial:  11.5 LV IVS:        0.90 cm      LV e' lateral:   12.90 cm/s LVOT diam:     1.90 cm      LV E/e' lateral: 8.9 LV SV:         79 LV SV Index:   34 LVOT Area:     2.84 cm                              3D Volume EF: LV Volumes (MOD)            3D EF:        56 % LV vol d, MOD A2C: 103.6 ml LV EDV:       166 ml LV vol d, MOD A4C: 105.9 ml LV ESV:       73 ml LV vol s, MOD A2C: 45.2 ml  LV SV:        93 ml LV vol s, MOD A4C: 44.1 ml LV SV MOD A2C:     58.4 ml LV SV MOD A4C:     105.9 ml LV SV MOD BP:      63.3 ml RIGHT VENTRICLE RV Basal diam:  3.30 cm RV Mid diam:    2.70 cm RV S prime:     14.00 cm/s TAPSE (M-mode): 1.8 cm LEFT ATRIUM           Index        RIGHT ATRIUM           Index LA diam:      3.50 cm 1.48 cm/m   RA Area:     11.00 cm LA Vol (A4C): 34.4 ml 14.57 ml/m  RA Volume:   23.00 ml  9.74 ml/m  AORTIC VALVE                     PULMONIC VALVE AV Area (Vmax):    2.34 cm      PV Vmax:       1.13 m/s AV Area (Vmean):   2.31 cm      PV Peak grad:  5.1 mmHg AV Area (VTI):     2.26 cm AV Vmax:           171.00 cm/s AV Vmean:          115.000 cm/s AV VTI:            0.352 m AV Peak Grad:  11.7 mmHg AV Mean Grad:      6.0 mmHg LVOT Vmax:         141.00 cm/s LVOT Vmean:        93.800 cm/s LVOT VTI:          0.280 m LVOT/AV VTI ratio: 0.80  AORTA Ao Root diam: 3.20 cm Ao Asc diam:  3.60 cm MITRAL VALVE MV Area (PHT): 4.31 cm     SHUNTS MV Decel Time: 176 msec     Systemic VTI:  0.28 m MR Peak grad: 33.5 mmHg     Systemic Diam: 1.90 cm MR Vmax:      289.33 cm/s MV E velocity: 115.00 cm/s MV A velocity: 103.00 cm/s MV E/A ratio:  1.12 Gwyndolyn Kaufman MD Electronically signed by Gwyndolyn Kaufman MD Signature Date/Time: 04/25/2022/10:22:28 AM    Final    DG Chest Port 1 View  Result Date: 04/23/2022 CLINICAL DATA:  Shortness of breath and difficulty breathing EXAM: PORTABLE CHEST 1 VIEW COMPARISON:  02/04/2022  FINDINGS: Heart and mediastinal shadows are normal. The left lung is clear. There is right-sided perihilar, predominantly upper lobe bronchopneumonia. No dense consolidation or lobar collapse. No visible effusion. IMPRESSION: Right-sided perihilar, predominantly upper lobe bronchopneumonia. Electronically Signed   By: Nelson Chimes M.D.   On: 04/23/2022 21:03     LOS: 1 day   Oren Binet, MD  [facility]    To contact the attending provider between 7A-7P or the covering provider during after hours 7P-7A, please log into the web site [email or web address] and access using universal [place] password for that web site. If you do not have the password, please call the hospital operator.  04/25/2022, 12:40 PM

## 2022-04-26 DIAGNOSIS — J189 Pneumonia, unspecified organism: Secondary | ICD-10-CM | POA: Diagnosis not present

## 2022-04-26 LAB — COMPREHENSIVE METABOLIC PANEL
ALT: 28 U/L (ref 0–44)
AST: 51 U/L — ABNORMAL HIGH (ref 15–41)
Albumin: 2.3 g/dL — ABNORMAL LOW (ref 3.5–5.0)
Alkaline Phosphatase: 67 U/L (ref 38–126)
Anion gap: 12 (ref 5–15)
BUN: 18 mg/dL (ref 8–23)
CO2: 24 mmol/L (ref 22–32)
Calcium: 8.6 mg/dL — ABNORMAL LOW (ref 8.9–10.3)
Chloride: 103 mmol/L (ref 98–111)
Creatinine, Ser: 1.15 mg/dL — ABNORMAL HIGH (ref 0.44–1.00)
GFR, Estimated: 53 mL/min — ABNORMAL LOW (ref >60)
Glucose, Bld: 88 mg/dL (ref 70–99)
Potassium: 4.1 mmol/L (ref 3.5–5.1)
Sodium: 139 mmol/L (ref 135–145)
Total Bilirubin: 0.7 mg/dL (ref 0.3–1.2)
Total Protein: 6.7 g/dL (ref 6.5–8.1)

## 2022-04-26 LAB — CBC
HCT: 41.5 % (ref 36.0–46.0)
Hemoglobin: 13.4 g/dL (ref 12.0–15.0)
MCH: 27.7 pg (ref 26.0–34.0)
MCHC: 32.3 g/dL (ref 30.0–36.0)
MCV: 85.9 fL (ref 80.0–100.0)
Platelets: 89 10*3/uL — ABNORMAL LOW (ref 150–400)
RBC: 4.83 MIL/uL (ref 3.87–5.11)
RDW: 14 % (ref 11.5–15.5)
WBC: 5.5 10*3/uL (ref 4.0–10.5)
nRBC: 0 % (ref 0.0–0.2)

## 2022-04-26 LAB — GLUCOSE, CAPILLARY
Glucose-Capillary: 68 mg/dL — ABNORMAL LOW (ref 70–99)
Glucose-Capillary: 70 mg/dL (ref 70–99)
Glucose-Capillary: 120 mg/dL — ABNORMAL HIGH (ref 70–99)

## 2022-04-26 MED ORDER — LACTULOSE 10 GM/15ML PO SOLN: 20.0000 g | Freq: Every day | ORAL | 0 refills | Status: AC

## 2022-04-26 MED ORDER — SPIRONOLACTONE 50 MG PO TABS: 50.0000 mg | ORAL_TABLET | Freq: Every day | ORAL | 0 refills | Status: AC

## 2022-04-26 MED ORDER — LINACLOTIDE 145 MCG PO CAPS: 145.0000 ug | ORAL_CAPSULE | Freq: Every day | ORAL | 0 refills | Status: AC

## 2022-04-26 MED ORDER — AZITHROMYCIN 250 MG PO TABS: ORAL_TABLET | ORAL | 0 refills | Status: AC

## 2022-04-26 MED ORDER — AMOXICILLIN-POT CLAVULANATE 875-125 MG PO TABS
1.0000 | ORAL_TABLET | Freq: Two times a day (BID) | ORAL | Status: DC
  Administered 2022-04-26: 1 via ORAL
  Filled 2022-04-26: qty 1

## 2022-04-26 MED ORDER — AMOXICILLIN-POT CLAVULANATE 875-125 MG PO TABS: 1.0000 | ORAL_TABLET | Freq: Two times a day (BID) | ORAL | 0 refills | Status: AC

## 2022-04-26 MED ORDER — OZEMPIC (2 MG/DOSE) 8 MG/3ML ~~LOC~~ SOPN: 2.0000 mg | PEN_INJECTOR | SUBCUTANEOUS | 0 refills | Status: DC

## 2022-04-26 NOTE — Discharge Summary (Signed)
Physician Discharge Summary  Patient ID: Katherine [name] MRN: [id number] DOB/AGE: 1955/10/20 67 y.o.  Admit date: 04/23/2022 Discharge date: 04/26/2022  Admission Diagnoses:  Discharge Diagnoses:  Principal Problem:   CAP (community acquired pneumonia)  Acute hypoxic respiratory failure Severe sepsis Probable aspiration pneumonia Intractable nausea and vomiting Hypokalemia Acute kidney injury Thrombocytopenia NASH liver cirrhosis Diabetes mellitus   Discharged Condition: stable  Hospital Course: Patient is a 67 year old female with history of [place] liver cirrhosis, diabetes mellitus type 2, hypertension, chronic pain syndrome on oxycodone.  Patient was admitted with weakness, caffeine and vomiting.  Patient was found to be in acute hypoxic respiratory failure with sepsis physiology due to aspiration pneumonia.  Patient was admitted and managed with antibiotics.  Patient also needed supplemental oxygen via nasal cannula.  Patient has improved significantly.  O2 sat with activity prior to discharge was 96%.  Patient will be discharged back home to complete course of antibiotics.  Acute hypoxic respiratory failure: Severe sepsis: Probable aspiration pneumonia: -Sepsis physiology has resolved. -O2 sat with activity prior to discharge was 96%.  Patient will not be discharged back home on oxygen. -Patient will complete course of antibiotics. -Patient is eager to be discharged back home.     Intractable nausea/vomiting Unclear if this was from pneumonia or viral syndrome that caused her to have nausea/vomiting Abdominal exam was benign-no clinical suggestion of SBO Vomiting has resolved-she is tolerating advancement in diet. Antiemetics as needed.   Hypokalemia Repleted   AKI Mild Likely hemodynamically mediated to nausea/vomiting Improved with supportive care Continue supportive care   NASH liver cirrhosis Appears well compensated Mentation is stable No evidence of  ascites/significant edema on exam Continue lactulose/rifaximin Will attempt to resume diuretics today given improvement in renal function   Thrombocytopenia Appears to be a chronic issue Likely due to hypersplenism in the setting of liver cirrhosis Probably worsened due to PNA/sepsis-but now getting better Follow CBC   DM-2 (A1c 6.4 on 2/22) Borderline hypoglycemic this morning-decrease 20 units Continue SSI Follow/optimize      Recent Labs (last 2 labs)       Recent Labs    04/24/22 2034 04/25/22 0802 04/25/22 1105  GLUCAP 135* 76 120*        HTN BP stable Appears to be controlled just on diuretics   HLD Statin   Hypothyroidism Synthroid   Mood disorder Slightly anxious Continue Remeron/Cymbalta/Klonopin   Chronic pain Neurontin As needed oxycodone   Debility/deconditioning Due to acute illness PT/OT eval   MorbidObesity: Estimated body mass index is 47.68 kg/m as calculated from the following:   Height as of this encounter: '5\' 6"'$  (1.676 m).   Weight as of this encounter: 134 kg.       Consults: None   Discharge Exam: Blood pressure (!) 127/58, pulse 77, temperature 97.7 F (36.5 C), temperature source Oral, resp. rate 15, height '5\' 6"'$  (1.676 m), weight 134 kg, SpO2 97 %.   Disposition: Discharge disposition: 01-Home or Self Care       Discharge Instructions     Ambulatory Referral for Lung Cancer Scre   Complete by: As directed    Diet - low sodium heart healthy   Complete by: As directed    Increase activity slowly   Complete by: As directed       Allergies as of 04/26/2022       Reactions   Adhesive [tape] Other (See Comments)   BLISTERS   Morphine And Related Other (See Comments)   The  pills causes hallucinations   Morphine Sulfate    Other reaction(s): hallucinations Other reaction(s): hallucinations   Other    Other reaction(s): blisters        Medication List     STOP taking these medications    diclofenac  Sodium 1 % Gel Commonly known as: VOLTAREN   diphenhydrAMINE 25 MG tablet Commonly known as: BENADRYL   lidocaine 5 % ointment Commonly known as: XYLOCAINE   methylPREDNISolone 4 MG Tbpk tablet Commonly known as: MEDROL DOSEPAK   mupirocin ointment 2 % Commonly known as: BACTROBAN       TAKE these medications    alendronate 70 MG tablet Commonly known as: FOSAMAX Take 70 mg by mouth once a week. Take with a full glass of water on an empty stomach.   amoxicillin-clavulanate 875-125 MG tablet Commonly known as: AUGMENTIN Take 1 tablet by mouth every 12 (twelve) hours for 5 days.   azithromycin 250 MG tablet Commonly known as: Zithromax Z-Pak Take 2 tablets (500 mg) on  Day 1,  followed by 1 tablet (250 mg) once daily on Days 2 through 5.   BD Pen Needle Nano U/F 32G X 4 MM Misc Generic drug: Insulin Pen Needle   Centrum Women Tabs Take 1 tablet by mouth daily.   clonazePAM 0.5 MG tablet Commonly known as: KLONOPIN Take 0.5 mg by mouth 2 (two) times daily.   DULoxetine 60 MG capsule Commonly known as: CYMBALTA Take 60 mg by mouth daily.   Farxiga 5 MG Tabs tablet Generic drug: dapagliflozin propanediol Take 5 mg by mouth daily.   furosemide 20 MG tablet Commonly known as: LASIX Take 20 mg by mouth daily.   gabapentin 300 MG capsule Commonly known as: NEURONTIN TAKE 1 CAPSULE BY MOUTH TWICE A DAY   lactulose 10 GM/15ML solution Commonly known as: CHRONULAC Take 30 mLs (20 g total) by mouth daily. Start taking on: April 27, 2022   levothyroxine 25 MCG tablet Commonly known as: SYNTHROID TAKE 1 TABLET BY MOUTH DAILY BEFORE BREAKFAST.   linaclotide 145 MCG Caps capsule Commonly known as: Linzess Take 1 capsule (145 mcg total) by mouth daily before breakfast. Start taking on: April 27, 2022 What changed:  See the new instructions. Another medication with the same name was removed. Continue taking this medication, and follow the directions you see  here.   methocarbamol 500 MG tablet Commonly known as: ROBAXIN TAKE 1 TABLET BY MOUTH EVERY 6 HOURS AS NEEDED FOR MUSCLE SPASMS.   mirtazapine 15 MG tablet Commonly known as: REMERON Take 1 tablet by mouth at bedtime.   nebivolol 10 MG tablet Commonly known as: BYSTOLIC Take 10 mg by mouth daily.   Oxycodone HCl 10 MG Tabs Take 1 tablet (10 mg total) by mouth 3 (three) times daily as needed. What changed: reasons to take this   Ozempic (2 MG/DOSE) 8 MG/3ML Sopn Generic drug: Semaglutide (2 MG/DOSE) Inject 2 mg into the skin once a week. What changed:  how much to take when to take this Another medication with the same name was removed. Continue taking this medication, and follow the directions you see here.   pantoprazole 40 MG tablet Commonly known as: PROTONIX Take 40 mg by mouth daily.   polyethylene glycol 17 g packet Commonly known as: MIRALAX / GLYCOLAX Take 17 g by mouth daily. What changed:  when to take this reasons to take this   rizatriptan 10 MG tablet Commonly known as: MAXALT Take 10 mg by mouth  as needed for migraine. May repeat in 2 hours if needed   rosuvastatin 40 MG tablet Commonly known as: CRESTOR Take 40 mg by mouth at bedtime.   spironolactone 50 MG tablet Commonly known as: ALDACTONE Take 1 tablet (50 mg total) by mouth daily. Start taking on: April 27, 2022   [name] SoloStar 300 UNIT/ML Solostar Pen Generic drug: insulin glargine (1 Unit Dial) Inject 50 Units into the skin daily.   Xifaxan 550 MG Tabs tablet Generic drug: rifaximin Take 550 mg by mouth 2 (two) times daily.               Durable Medical Equipment  (From admission, onward)           Start     Ordered   04/26/22 1435  DME Oxygen  Once       Question Answer Comment  Length of Need 6 Months   Mode or (Route) Nasal cannula   Liters per Minute 2   Oxygen delivery system Gas      04/26/22 1435            Time spent: 36  minutes.   SignedBonnell Public 04/26/2022, 2:36 PM

## 2022-04-26 NOTE — Progress Notes (Signed)
SATURATION QUALIFICATIONS: (This note is used to comply with regulatory documentation for home oxygen)  Patient Saturations on Room Air at Rest = 97 %  Patient Saturations on Room Air while Ambulating = 96 %   

## 2022-04-26 NOTE — Progress Notes (Signed)
AVS given and explained to patient, awaiting for ride.

## 2022-04-26 NOTE — Progress Notes (Signed)
Occupational Therapy Treatment Patient Details Name: Katherine [name] MRN: [id number] DOB: 20-Sep-1955 Today's Date: 04/26/2022   History of present illness Katherine [name] is a 67 y.o. female admitted 2/21 with Acute hypoxic respiratory failure  Severe sepsis  Probable aspiration pneumonia. PMHx: NASH liver cirrhosis, DM-2, HTN, chronic pain on oxycodone   OT comments  Pt. Seen for skilled OT treatment.  Able to complete lb dressing without difficulty.  Reviewed tech. For pericare.  Pt. Reports she is able to use RUE for this last night and without IV in that extremity she was able to clean herself without issue.  Provided examples of and reviewed energy conservation strategies with pt.  She verbalized understanding and was able to verbalize examples she currently uses at home.  Pt. Reports son is at the house and also assists with meal prep prn.     Recommendations for follow up therapy are one component of a multi-disciplinary discharge planning process, led by the attending physician.  Recommendations may be updated based on patient status, additional functional criteria and insurance authorization.    Follow Up Recommendations  No OT follow up     Assistance Recommended at Discharge PRN  Patient can return home with the following  Assistance with cooking/housework;Assist for transportation;Help with stairs or ramp for entrance;A little help with bathing/dressing/bathroom   Equipment Recommendations  None recommended by OT    Recommendations for Other Services      Precautions / Restrictions Precautions Precautions: Fall Precaution Comments: monitor O2 Restrictions Weight Bearing Restrictions: No       Mobility Bed Mobility                    Transfers                         Balance                                           ADL either performed or assessed with clinical judgement   ADL Overall ADL's : Needs assistance/impaired                      Lower Body Dressing: Supervision/safety;Sitting/lateral leans         Toileting - Clothing Manipulation Details (indicate cue type and reason): pt. reports issues with wiping was related to IV placement in UE.  since IV out states she used R hand to wipe without difficulty declined need for further intervention with this       General ADL Comments: seated during session    Extremity/Trunk Assessment              Vision       Perception     Praxis      Cognition Arousal/Alertness: Awake/alert Behavior During Therapy: WFL for tasks assessed/performed Overall Cognitive Status: Within Functional Limits for tasks assessed                                          Exercises      Shoulder Instructions       General Comments      Pertinent Vitals/ Pain       Pain Assessment Pain Assessment: No/denies pain  Home Living  Prior Functioning/Environment              Frequency  Min 2X/week        Progress Toward Goals  OT Goals(current goals can now be found in the care plan section)  Progress towards OT goals: Progressing toward goals     Plan      Co-evaluation                 AM-PAC OT "6 Clicks" Daily Activity     Outcome Measure   Help from another person eating meals?: None Help from another person taking care of personal grooming?: A Little Help from another person toileting, which includes using toliet, bedpan, or urinal?: A Lot   Help from another person to put on and taking off regular upper body clothing?: A Little Help from another person to put on and taking off regular lower body clothing?: A Little 6 Click Score: 15    End of Session    OT Visit Diagnosis: Other abnormalities of gait and mobility (R26.89);Unsteadiness on feet (R26.81);Muscle weakness (generalized) (M62.81)   Activity Tolerance Patient tolerated  treatment well   Patient Left in chair;with call bell/phone within reach   Nurse Communication          Time: [id number] OT Time Calculation (min): 11 min  Charges: OT General Charges $OT Visit: 1 Visit OT Treatments $Self Care/Home Management : 8-22 mins  Sonia Baller, COTA/L Acute Rehabilitation [phone]   Clearnce Sorrel Lorraine-COTA/L 04/26/2022, 12:33 PM

## 2022-04-26 NOTE — Progress Notes (Incomplete)
PROGRESS NOTE        PATIENT DETAILS Name: Katherine [name] Age: 67 y.o. Sex: female Date of Birth: 01-25-1956 Admit Date: 04/23/2022 Admitting Physician Kayleen Memos, DO [id number], Nathen May, MD  Brief Summary: Katherine [name] is a 67 y.o. female with medical history significant of NASH liver cirrhosis, DM-2, HTN, chronic pain on oxycodone who presented to [place] on 2/21 with 2-3-day history of weakness, coughing, vomiting-found to have acute hypoxic respiratory failure with sepsis physiology due to aspiration pneumonia.   Significant events: 2/21>> admit to [facility]  Significant studies: 2/21>> CXR: Right-sided perihilar bronchopneumonia-upper lobe. 2/22>> echo: EF 60-65%  Significant microbiology data: 2/21>> COVID/influenza/RSV PCR: Negative 2/21 blood culture: Negative  Procedures: None  Consults: None  Subjective: Feels much better.  Still requiring around 2 L of oxygen.  No vomiting.  Tolerating diet.  Objective: Vitals: Blood pressure (!) 127/58, pulse 77, temperature 97.7 F (36.5 C), temperature source Oral, resp. rate 15, height '5\' 6"'$  (1.676 m), weight 134 kg, SpO2 97 %.   Exam: Gen Exam:Alert awake-not in any distress HEENT:atraumatic, normocephalic Chest: B/L clear to auscultation anteriorly CVS:S1S2 regular Abdomen:soft non tender, non distended Extremities:no edema Neurology: Non focal Skin: no rash  Pertinent Labs/Radiology:    Latest Ref Rng & Units 04/26/2022    4:00 AM 04/25/2022   10:17 AM 04/24/2022    4:01 AM  CBC  WBC 4.0 - 10.5 K/uL 5.5  8.3  13.7   Hemoglobin 12.0 - 15.0 g/dL 13.4  13.5  12.4   Hematocrit 36.0 - 46.0 % 41.5  41.7  38.2   Platelets 150 - 400 K/uL 89  103  61     Lab Results  Component Value Date   NA 139 04/26/2022   K 4.1 04/26/2022   CL 103 04/26/2022   CO2 24 04/26/2022      Assessment/Plan: Acute hypoxic respiratory failure Severe sepsis Probable aspiration  pneumonia Improved-feels better-still on 2 L of oxygen Continue Unasyn x 5 days Continue Zithromax x 3 days Appreciate SLP eval-no overt aspiration issues.   Follow-up culture data.   Titrate down to FiO2  Intractable nausea/vomiting Unclear if this was from pneumonia or viral syndrome that caused her to have nausea/vomiting Abdominal exam was benign-no clinical suggestion of SBO Vomiting has resolved-she is tolerating advancement in diet. Antiemetics as needed.   Hypokalemia Repleted   AKI Mild Likely hemodynamically mediated to nausea/vomiting Improved with supportive care Continue supportive care   NASH liver cirrhosis Appears well compensated Mentation is stable No evidence of ascites/significant edema on exam Continue lactulose/rifaximin Will attempt to resume diuretics today given improvement in renal function  Thrombocytopenia Appears to be a chronic issue Likely due to hypersplenism in the setting of liver cirrhosis Probably worsened due to PNA/sepsis-but now getting better Follow CBC   DM-2 (A1c 6.4 on 2/22) Borderline hypoglycemic this morning-decrease 20 units Continue SSI Follow/optimize    Recent Labs    04/26/22 0742 04/26/22 0814 04/26/22 1143  GLUCAP 68* 70 120*      HTN BP stable Appears to be controlled just on diuretics   HLD Statin   Hypothyroidism Synthroid   Mood disorder Slightly anxious Continue Remeron/Cymbalta/Klonopin   Chronic pain Neurontin As needed oxycodone  Debility/deconditioning Due to acute illness PT/OT eval   MorbidObesity: Estimated body mass index is 47.68 kg/m as calculated  from the following:   Height as of this encounter: '5\' 6"'$  (1.676 m).   Weight as of this encounter: 134 kg.   Code status:   Code Status: Full Code (Was DNR on 2/22-but meant that she did not want LONGTERM vent support)  DVT Prophylaxis: since platelet count better start Prophylactic Lovenox 2/23-monitor CBC enoxaparin  (LOVENOX) injection 40 mg Start: 04/25/22 1330  Family Communication: None at bedside   Disposition Plan: Status is: Inpatient Remains inpatient appropriate because: Severity of illness   Planned Discharge Destination:Home   Diet: Diet Order             Diet Carb Modified Fluid consistency: Thin; Room service appropriate? Yes  Diet effective now                     Antimicrobial agents: Anti-infectives (From admission, onward)    Start     Dose/Rate Route Frequency Ordered Stop   04/26/22 0600  amoxicillin-clavulanate (AUGMENTIN) 875-125 MG per tablet 1 tablet        1 tablet Oral Every 12 hours 04/26/22 0506     04/24/22 2200  azithromycin (ZITHROMAX) 500 mg in sodium chloride 0.9 % 250 mL IVPB  Status:  Discontinued        500 mg 250 mL/hr over 60 Minutes Intravenous Every 24 hours 04/24/22 0309 04/24/22 1717   04/24/22 2200  azithromycin (ZITHROMAX) 500 mg in sodium chloride 0.9 % 250 mL IVPB        500 mg 250 mL/hr over 60 Minutes Intravenous Every 24 hours 04/24/22 1717 04/27/22 2159   04/24/22 1000  cefTRIAXone (ROCEPHIN) 2 g in sodium chloride 0.9 % 100 mL IVPB  Status:  Discontinued        2 g 200 mL/hr over 30 Minutes Intravenous Every 24 hours 04/24/22 0309 04/24/22 0741   04/24/22 1000  rifaximin (XIFAXAN) tablet 550 mg        550 mg Oral 2 times daily 04/24/22 0657     04/24/22 0845  Ampicillin-Sulbactam (UNASYN) 3 g in sodium chloride 0.9 % 100 mL IVPB  Status:  Discontinued        3 g 200 mL/hr over 30 Minutes Intravenous Every 6 hours 04/24/22 0754 04/26/22 0506   04/23/22 2300  cefTRIAXone (ROCEPHIN) 1 g in sodium chloride 0.9 % 100 mL IVPB        1 g 200 mL/hr over 30 Minutes Intravenous  Once 04/23/22 2255 04/24/22 0134   04/23/22 2300  azithromycin (ZITHROMAX) 500 mg in sodium chloride 0.9 % 250 mL IVPB        500 mg 250 mL/hr over 60 Minutes Intravenous  Once 04/23/22 2255 04/24/22 0134        MEDICATIONS: Scheduled Meds:   amoxicillin-clavulanate  1 tablet Oral Q12H   clonazePAM  0.5 mg Oral QHS   DULoxetine  60 mg Oral Daily   enoxaparin (LOVENOX) injection  40 mg Subcutaneous Q24H   furosemide  20 mg Oral Daily   gabapentin  300 mg Oral BID   insulin aspart  0-15 Units Subcutaneous TID WC   insulin aspart  0-5 Units Subcutaneous QHS   insulin glargine-yfgn  20 Units Subcutaneous Daily   lactulose  20 g Oral Daily   levothyroxine  25 mcg Oral QAC breakfast   linaclotide  145 mcg Oral QAC breakfast   metoprolol tartrate  5 mg Intravenous Q12H   mirtazapine  15 mg Oral QHS   pantoprazole  40 mg  Oral Daily   polyethylene glycol  17 g Oral Daily   rifaximin  550 mg Oral BID   rosuvastatin  40 mg Oral QHS   sodium chloride flush  3 mL Intravenous Q12H   spironolactone  50 mg Oral Daily   Continuous Infusions:  sodium chloride Stopped (04/25/22 0603)   azithromycin 500 mg (04/25/22 2228)   PRN Meds:.sodium chloride, acetaminophen **OR** acetaminophen, albuterol, Gerhardt's butt cream, guaiFENesin, melatonin, ondansetron **OR** ondansetron (ZOFRAN) IV, mouth rinse, oxyCODONE   I have personally reviewed following labs and imaging studies  LABORATORY DATA: CBC: Recent Labs  Lab 04/23/22 1915 04/24/22 0401 04/25/22 1017 04/26/22 0400  WBC 19.0* 13.7* 8.3 5.5  NEUTROABS 17.7* 11.0*  --   --   HGB 13.7 12.4 13.5 13.4  HCT 41.5 38.2 41.7 41.5  MCV 83.3 86.2 84.8 85.9  PLT 75* 61* 103* 89*     Basic Metabolic Panel: Recent Labs  Lab 04/23/22 1915 04/24/22 0401 04/25/22 1017 04/26/22 0400  NA 131* 135 137 139  K 3.3* 3.1* 3.5 4.1  CL 92* 95* 102 103  CO2 '28 27 24 24  '$ GLUCOSE 193* 126* 163* 88  BUN 25* '23 19 18  '$ CREATININE 1.23* 1.21* 1.17* 1.15*  CALCIUM 10.1 9.1 8.9 8.6*     GFR: Estimated Creatinine Clearance: 67.8 mL/min (A) (by C-G formula based on SCr of 1.15 mg/dL (H)).  Liver Function Tests: Recent Labs  Lab 04/23/22 1915 04/25/22 1017 04/26/22 0400  AST 43* 51* 51*   ALT '21 26 28  '$ ALKPHOS 73 67 67  BILITOT 2.1* 1.1 0.7  PROT 7.7 7.2 6.7  ALBUMIN 3.3* 2.5* 2.3*    No results for input(s): "LIPASE", "AMYLASE" in the last 168 hours. No results for input(s): "AMMONIA" in the last 168 hours.  Coagulation Profile: No results for input(s): "INR", "PROTIME" in the last 168 hours.  Cardiac Enzymes: No results for input(s): "CKTOTAL", "CKMB", "CKMBINDEX", "TROPONINI" in the last 168 hours.  BNP (last 3 results) No results for input(s): "PROBNP" in the last 8760 hours.  Lipid Profile: No results for input(s): "CHOL", "HDL", "LDLCALC", "TRIG", "CHOLHDL", "LDLDIRECT" in the last 72 hours.  Thyroid Function Tests: No results for input(s): "TSH", "T4TOTAL", "FREET4", "T3FREE", "THYROIDAB" in the last 72 hours.  Anemia Panel: No results for input(s): "VITAMINB12", "FOLATE", "FERRITIN", "TIBC", "IRON", "RETICCTPCT" in the last 72 hours.  Urine analysis:    Component Value Date/Time   COLORURINE YELLOW 04/23/2022 2321   APPEARANCEUR CLEAR 04/23/2022 2321   LABSPEC 1.012 04/23/2022 2321   PHURINE 5.5 04/23/2022 2321   GLUCOSEU >1,000 (A) 04/23/2022 2321   HGBUR TRACE (A) 04/23/2022 2321   BILIRUBINUR NEGATIVE 04/23/2022 2321   KETONESUR NEGATIVE 04/23/2022 2321   PROTEINUR TRACE (A) 04/23/2022 2321   UROBILINOGEN 1.0 11/21/2014 1955   NITRITE NEGATIVE 04/23/2022 2321   LEUKOCYTESUR TRACE (A) 04/23/2022 2321    Sepsis Labs: Lactic Acid, Venous    Component Value Date/Time   LATICACIDVEN 1.8 04/24/2022 0824    MICROBIOLOGY: Recent Results (from the past 240 hour(s))  Resp panel by RT-PCR (RSV, Flu A&B, Covid) Anterior Nasal Swab     Status: None   Collection Time: 04/23/22  7:15 PM   Specimen: Anterior Nasal Swab  Result Value Ref Range Status   SARS Coronavirus 2 by RT PCR NEGATIVE NEGATIVE Final    Comment: (NOTE) SARS-CoV-2 target nucleic acids are NOT DETECTED.  The SARS-CoV-2 RNA is generally detectable in upper  respiratory specimens during the acute phase  of infection. The lowest concentration of SARS-CoV-2 viral copies this assay can detect is 138 copies/mL. A negative result does not preclude SARS-Cov-2 infection and should not be used as the sole basis for treatment or other patient management decisions. A negative result may occur with  improper specimen collection/handling, submission of specimen other than nasopharyngeal swab, presence of viral mutation(s) within the areas targeted by this assay, and inadequate number of viral copies(<138 copies/mL). A negative result must be combined with clinical observations, patient history, and epidemiological information. The expected result is Negative.  Fact Sheet for Patients:  EntrepreneurPulse.com.au  Fact Sheet for Healthcare Providers:  IncredibleEmployment.be  This test is no t yet approved or cleared by the [place] FDA and  has been authorized for detection and/or diagnosis of SARS-CoV-2 by FDA under an Emergency Use Authorization (EUA). This EUA will remain  in effect (meaning this test can be used) for the duration of the COVID-19 declaration under Section 564(b)(1) of the Act, 21 U.S.C.section 360bbb-3(b)(1), unless the authorization is terminated  or revoked sooner.       Influenza A by PCR NEGATIVE NEGATIVE Final   Influenza B by PCR NEGATIVE NEGATIVE Final    Comment: (NOTE) The Xpert Xpress SARS-CoV-2/FLU/RSV plus assay is intended as an aid in the diagnosis of influenza from Nasopharyngeal swab specimens and should not be used as a sole basis for treatment. Nasal washings and aspirates are unacceptable for Xpert Xpress SARS-CoV-2/FLU/RSV testing.  Fact Sheet for Patients: EntrepreneurPulse.com.au  Fact Sheet for Healthcare Providers: IncredibleEmployment.be  This test is not yet approved or cleared by the [place] FDA and has been  authorized for detection and/or diagnosis of SARS-CoV-2 by FDA under an Emergency Use Authorization (EUA). This EUA will remain in effect (meaning this test can be used) for the duration of the COVID-19 declaration under Section 564(b)(1) of the Act, 21 U.S.C. section 360bbb-3(b)(1), unless the authorization is terminated or revoked.     Resp Syncytial Virus by PCR NEGATIVE NEGATIVE Final    Comment: (NOTE) Fact Sheet for Patients: EntrepreneurPulse.com.au  Fact Sheet for Healthcare Providers: IncredibleEmployment.be  This test is not yet approved or cleared by the [place] FDA and has been authorized for detection and/or diagnosis of SARS-CoV-2 by FDA under an Emergency Use Authorization (EUA). This EUA will remain in effect (meaning this test can be used) for the duration of the COVID-19 declaration under Section 564(b)(1) of the Act, 21 U.S.C. section 360bbb-3(b)(1), unless the authorization is terminated or revoked.  Performed at [facility], [address] [place], [place] [address]   Culture, blood (routine x 2)     Status: None (Preliminary result)   Collection Time: 04/23/22 11:22 PM   Specimen: BLOOD  Result Value Ref Range Status   Specimen Description   Final    BLOOD Performed at [facility], [address] [place], [place] [address]    Special Requests   Final    NONE Performed at [facility], [address], [place], [place] [address]    Culture   Final    NO GROWTH 2 DAYS Performed at [facility], [place] [address], [place], [place] [address]    Report Status PENDING  Incomplete  Culture, blood (routine x 2)     Status: None (Preliminary result)   Collection Time: 04/24/22  3:59 AM   Specimen: BLOOD LEFT HAND  Result Value Ref Range Status   Specimen Description BLOOD LEFT HAND  Final  Special Requests   Final    BOTTLES DRAWN AEROBIC AND  ANAEROBIC Blood Culture adequate volume   Culture   Final    NO GROWTH 2 DAYS Performed at [facility], [place] [address], [place], [place] [address]    Report Status PENDING  Incomplete    RADIOLOGY STUDIES/RESULTS: ECHOCARDIOGRAM COMPLETE  Result Date: 04/25/2022    ECHOCARDIOGRAM REPORT   Patient Name:   JAELYNN COLOM Date of Exam: 04/25/2022 Medical Rec #:  [id number]         Height:       66.0 in Accession #:    [id number]        Weight:       295.4 lb Date of Birth:  Oct 11, 1955          BSA:          2.360 m Patient Age:    56 years          BP:           114/57 mmHg Patient Gender: F                 HR:           73 bpm. Exam Location:  Inpatient Procedure: 2D Echo and Intracardiac Opacification Agent Indications:    CHF  History:        Patient has prior history of Echocardiogram examinations, most                 recent 09/08/2012. Risk Factors:Diabetes, Dyslipidemia and                 Hypertension.  Sonographer:    Harvie Junior Referring Phys: Samella Parr  Sonographer Comments: Technically difficult study due to poor echo windows and patient is obese. Image acquisition challenging due to patient body habitus. IMPRESSIONS  1. Left ventricular ejection fraction, by estimation, is 60 to 65%. The left ventricle has normal function. The left ventricle has no regional wall motion abnormalities. Left ventricular diastolic parameters were normal.  2. Right ventricular systolic function is normal. The right ventricular size is normal. Tricuspid regurgitation signal is inadequate for assessing PA pressure.  3. The mitral valve is degenerative. Trivial mitral valve regurgitation. No evidence of mitral stenosis.  4. The aortic valve is tricuspid. There is mild calcification of the aortic valve. There is mild thickening of the aortic valve. Aortic valve regurgitation is not visualized. Aortic valve sclerosis/calcification is present, without any evidence of aortic stenosis.  5. The inferior vena cava  is normal in size with greater than 50% respiratory variability, suggesting right atrial pressure of 3 mmHg. FINDINGS  Left Ventricle: Left ventricular ejection fraction, by estimation, is 60 to 65%. The left ventricle has normal function. The left ventricle has no regional wall motion abnormalities. Definity contrast agent was given IV to delineate the left ventricular  endocardial borders. 3D left ventricular ejection fraction analysis performed but not reported based on interpreter judgement due to suboptimal tracking. The left ventricular internal cavity size was normal in size. There is no left ventricular hypertrophy. Left ventricular diastolic parameters were normal. Right Ventricle: The right ventricular size is normal. No increase in right ventricular wall thickness. Right ventricular systolic function is normal. Tricuspid regurgitation signal is inadequate for assessing PA pressure. Left Atrium: Left atrial size was normal in size. Right Atrium: Right atrial size was normal in size. Pericardium: There is no evidence of pericardial effusion. Mitral Valve: The mitral valve  is degenerative in appearance. There is mild thickening of the mitral valve leaflet(s). There is mild calcification of the mitral valve leaflet(s). Trivial mitral valve regurgitation. No evidence of mitral valve stenosis. Tricuspid Valve: The tricuspid valve is normal in structure. Tricuspid valve regurgitation is trivial. Aortic Valve: The aortic valve is tricuspid. There is mild calcification of the aortic valve. There is mild thickening of the aortic valve. Aortic valve regurgitation is not visualized. Aortic valve sclerosis/calcification is present, without any evidence of aortic stenosis. Aortic valve mean gradient measures 6.0 mmHg. Aortic valve peak gradient measures 11.7 mmHg. Aortic valve area, by VTI measures 2.26 cm. Pulmonic Valve: The pulmonic valve was not well visualized. Pulmonic valve regurgitation is not visualized.  Aorta: The aortic root is normal in size and structure. Venous: The inferior vena cava is normal in size with greater than 50% respiratory variability, suggesting right atrial pressure of 3 mmHg. IAS/Shunts: The atrial septum is grossly normal.  LEFT VENTRICLE PLAX 2D LVIDd:         5.20 cm      Diastology LVIDs:         3.80 cm      LV e' medial:    10.00 cm/s LV PW:         0.90 cm      LV E/e' medial:  11.5 LV IVS:        0.90 cm      LV e' lateral:   12.90 cm/s LVOT diam:     1.90 cm      LV E/e' lateral: 8.9 LV SV:         79 LV SV Index:   34 LVOT Area:     2.84 cm                              3D Volume EF: LV Volumes (MOD)            3D EF:        56 % LV vol d, MOD A2C: 103.6 ml LV EDV:       166 ml LV vol d, MOD A4C: 105.9 ml LV ESV:       73 ml LV vol s, MOD A2C: 45.2 ml  LV SV:        93 ml LV vol s, MOD A4C: 44.1 ml LV SV MOD A2C:     58.4 ml LV SV MOD A4C:     105.9 ml LV SV MOD BP:      63.3 ml RIGHT VENTRICLE RV Basal diam:  3.30 cm RV Mid diam:    2.70 cm RV S prime:     14.00 cm/s TAPSE (M-mode): 1.8 cm LEFT ATRIUM           Index        RIGHT ATRIUM           Index LA diam:      3.50 cm 1.48 cm/m   RA Area:     11.00 cm LA Vol (A4C): 34.4 ml 14.57 ml/m  RA Volume:   23.00 ml  9.74 ml/m  AORTIC VALVE                     PULMONIC VALVE AV Area (Vmax):    2.34 cm      PV Vmax:       1.13 m/s AV Area (Vmean):   2.31 cm  PV Peak grad:  5.1 mmHg AV Area (VTI):     2.26 cm AV Vmax:           171.00 cm/s AV Vmean:          115.000 cm/s AV VTI:            0.352 m AV Peak Grad:      11.7 mmHg AV Mean Grad:      6.0 mmHg LVOT Vmax:         141.00 cm/s LVOT Vmean:        93.800 cm/s LVOT VTI:          0.280 m LVOT/AV VTI ratio: 0.80  AORTA Ao Root diam: 3.20 cm Ao Asc diam:  3.60 cm MITRAL VALVE MV Area (PHT): 4.31 cm     SHUNTS MV Decel Time: 176 msec     Systemic VTI:  0.28 m MR Peak grad: 33.5 mmHg     Systemic Diam: 1.90 cm MR Vmax:      289.33 cm/s MV E velocity: 115.00 cm/s MV A velocity:  103.00 cm/s MV E/A ratio:  1.12 Gwyndolyn Kaufman MD Electronically signed by Gwyndolyn Kaufman MD Signature Date/Time: 04/25/2022/10:22:28 AM    Final      LOS: 2 days   Bonnell Public, MD  [facility]    To contact the attending provider between 7A-7P or the covering provider during after hours 7P-7A, please log into the web site [email or web address] and access using universal Angie password for that web site. If you do not have the password, please call the hospital operator.  04/26/2022, 12:42 PM

## 2022-04-26 NOTE — Progress Notes (Addendum)
Patient IV is removed. IV team unsuccessful in putting new IV for patient. MD on call and Pharmacy made aware.

## 2022-04-26 NOTE — Plan of Care (Signed)
  Problem: Health Behavior/Discharge Planning: Goal: Ability to manage health-related needs will improve Outcome: Progressing   Problem: Clinical Measurements: Goal: Ability to maintain clinical measurements within normal limits will improve Outcome: Progressing Goal: Respiratory complications will improve Outcome: Progressing

## 2022-04-29 DIAGNOSIS — D72829 Elevated white blood cell count, unspecified: Secondary | ICD-10-CM | POA: Diagnosis not present

## 2022-04-29 DIAGNOSIS — N179 Acute kidney failure, unspecified: Secondary | ICD-10-CM | POA: Diagnosis not present

## 2022-04-29 DIAGNOSIS — R300 Dysuria: Secondary | ICD-10-CM | POA: Diagnosis not present

## 2022-04-29 DIAGNOSIS — J9691 Respiratory failure, unspecified with hypoxia: Secondary | ICD-10-CM | POA: Diagnosis not present

## 2022-04-29 DIAGNOSIS — A419 Sepsis, unspecified organism: Secondary | ICD-10-CM | POA: Diagnosis not present

## 2022-04-29 DIAGNOSIS — N182 Chronic kidney disease, stage 2 (mild): Secondary | ICD-10-CM | POA: Diagnosis not present

## 2022-04-29 DIAGNOSIS — R809 Proteinuria, unspecified: Secondary | ICD-10-CM | POA: Diagnosis not present

## 2022-04-29 DIAGNOSIS — E782 Mixed hyperlipidemia: Secondary | ICD-10-CM | POA: Diagnosis not present

## 2022-04-29 DIAGNOSIS — E039 Hypothyroidism, unspecified: Secondary | ICD-10-CM | POA: Diagnosis not present

## 2022-04-29 DIAGNOSIS — Z0000 Encounter for general adult medical examination without abnormal findings: Secondary | ICD-10-CM | POA: Diagnosis not present

## 2022-04-29 DIAGNOSIS — D696 Thrombocytopenia, unspecified: Secondary | ICD-10-CM | POA: Diagnosis not present

## 2022-04-29 DIAGNOSIS — K7460 Unspecified cirrhosis of liver: Secondary | ICD-10-CM | POA: Diagnosis not present

## 2022-04-29 DIAGNOSIS — E1122 Type 2 diabetes mellitus with diabetic chronic kidney disease: Secondary | ICD-10-CM | POA: Diagnosis not present

## 2022-04-29 LAB — CULTURE, BLOOD (ROUTINE X 2)
Culture: NO GROWTH
Culture: NO GROWTH
Special Requests: ADEQUATE

## 2022-05-14 DIAGNOSIS — E1165 Type 2 diabetes mellitus with hyperglycemia: Secondary | ICD-10-CM | POA: Diagnosis not present

## 2022-05-14 DIAGNOSIS — Z794 Long term (current) use of insulin: Secondary | ICD-10-CM | POA: Diagnosis not present

## 2022-05-14 DIAGNOSIS — R600 Localized edema: Secondary | ICD-10-CM | POA: Diagnosis not present

## 2022-05-14 DIAGNOSIS — K7581 Nonalcoholic steatohepatitis (NASH): Secondary | ICD-10-CM | POA: Diagnosis not present

## 2022-05-14 DIAGNOSIS — R110 Nausea: Secondary | ICD-10-CM | POA: Diagnosis not present

## 2022-05-16 DIAGNOSIS — E119 Type 2 diabetes mellitus without complications: Secondary | ICD-10-CM | POA: Diagnosis not present

## 2022-05-16 DIAGNOSIS — R110 Nausea: Secondary | ICD-10-CM | POA: Diagnosis not present

## 2022-05-16 DIAGNOSIS — Z794 Long term (current) use of insulin: Secondary | ICD-10-CM | POA: Diagnosis not present

## 2022-05-21 DIAGNOSIS — M1711 Unilateral primary osteoarthritis, right knee: Secondary | ICD-10-CM | POA: Diagnosis not present

## 2022-05-21 DIAGNOSIS — M25511 Pain in right shoulder: Secondary | ICD-10-CM | POA: Diagnosis not present

## 2022-05-26 ENCOUNTER — Ambulatory Visit
Admission: RE | Admit: 2022-05-26 | Discharge: 2022-05-26 | Disposition: A | Discharge status: Home / Self Care | Payer: Medicare Other | Source: Ambulatory Visit | Attending: Family Medicine | Admitting: Family Medicine

## 2022-05-26 ENCOUNTER — Other Ambulatory Visit: Payer: Self-pay | Admitting: Family Medicine

## 2022-05-26 DIAGNOSIS — J1100 Influenza due to unidentified influenza virus with unspecified type of pneumonia: Secondary | ICD-10-CM

## 2022-05-26 DIAGNOSIS — R918 Other nonspecific abnormal finding of lung field: Secondary | ICD-10-CM | POA: Diagnosis not present

## 2022-06-02 ENCOUNTER — Encounter: Payer: Medicare Other | Admitting: Registered Nurse

## 2022-06-09 ENCOUNTER — Encounter: Payer: Self-pay | Admitting: Registered Nurse

## 2022-06-09 ENCOUNTER — Encounter: Payer: Medicare Other | Attending: Registered Nurse | Admitting: Registered Nurse

## 2022-06-09 VITALS — BP 132/70 | HR 86 | Ht 66.0 in | Wt 283.0 lb

## 2022-06-09 DIAGNOSIS — F329 Major depressive disorder, single episode, unspecified: Secondary | ICD-10-CM | POA: Diagnosis not present

## 2022-06-09 DIAGNOSIS — G5710 Meralgia paresthetica, unspecified lower limb: Secondary | ICD-10-CM | POA: Diagnosis not present

## 2022-06-09 DIAGNOSIS — M1711 Unilateral primary osteoarthritis, right knee: Secondary | ICD-10-CM

## 2022-06-09 DIAGNOSIS — F419 Anxiety disorder, unspecified: Secondary | ICD-10-CM | POA: Diagnosis not present

## 2022-06-09 DIAGNOSIS — Z5181 Encounter for therapeutic drug level monitoring: Secondary | ICD-10-CM

## 2022-06-09 DIAGNOSIS — R202 Paresthesia of skin: Secondary | ICD-10-CM

## 2022-06-09 DIAGNOSIS — M1712 Unilateral primary osteoarthritis, left knee: Secondary | ICD-10-CM

## 2022-06-09 DIAGNOSIS — S8412XS Injury of peroneal nerve at lower leg level, left leg, sequela: Secondary | ICD-10-CM

## 2022-06-09 DIAGNOSIS — M47816 Spondylosis without myelopathy or radiculopathy, lumbar region: Secondary | ICD-10-CM | POA: Diagnosis not present

## 2022-06-09 DIAGNOSIS — G894 Chronic pain syndrome: Secondary | ICD-10-CM

## 2022-06-09 DIAGNOSIS — W19XXXD Unspecified fall, subsequent encounter: Secondary | ICD-10-CM | POA: Diagnosis not present

## 2022-06-09 DIAGNOSIS — M7541 Impingement syndrome of right shoulder: Secondary | ICD-10-CM | POA: Insufficient documentation

## 2022-06-09 DIAGNOSIS — Z79891 Long term (current) use of opiate analgesic: Secondary | ICD-10-CM | POA: Diagnosis not present

## 2022-06-09 DIAGNOSIS — S066XAA Traumatic subarachnoid hemorrhage with loss of consciousness status unknown, initial encounter: Secondary | ICD-10-CM | POA: Insufficient documentation

## 2022-06-09 DIAGNOSIS — Y92009 Unspecified place in unspecified non-institutional (private) residence as the place of occurrence of the external cause: Secondary | ICD-10-CM | POA: Diagnosis not present

## 2022-06-09 MED ORDER — OXYCODONE HCL 10 MG PO TABS: 10.0000 mg | ORAL_TABLET | Freq: Three times a day (TID) | ORAL | 0 refills | Status: DC | PRN
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
## 2022-06-09 NOTE — Patient Instructions (Signed)
Ask Pharmacist about: Biotin : Make sure not contraindicated with your other medications.

## 2022-06-12 LAB — TOXASSURE SELECT,+ANTIDEPR,UR

## 2022-06-17 ENCOUNTER — Telehealth: Payer: Self-pay | Admitting: *Deleted

## 2022-06-17 NOTE — Telephone Encounter (Signed)
Urine drug screen for this encounter is consistent for prescribed medication 

## 2022-06-26 DIAGNOSIS — Z794 Long term (current) use of insulin: Secondary | ICD-10-CM | POA: Diagnosis not present

## 2022-06-26 DIAGNOSIS — K5900 Constipation, unspecified: Secondary | ICD-10-CM | POA: Diagnosis not present

## 2022-06-26 DIAGNOSIS — E1169 Type 2 diabetes mellitus with other specified complication: Secondary | ICD-10-CM | POA: Diagnosis not present

## 2022-06-26 DIAGNOSIS — K7581 Nonalcoholic steatohepatitis (NASH): Secondary | ICD-10-CM | POA: Diagnosis not present

## 2022-06-26 DIAGNOSIS — R600 Localized edema: Secondary | ICD-10-CM | POA: Diagnosis not present

## 2022-07-02 ENCOUNTER — Other Ambulatory Visit: Payer: Self-pay | Admitting: Registered Nurse

## 2022-07-02 DIAGNOSIS — G5712 Meralgia paresthetica, left lower limb: Secondary | ICD-10-CM

## 2022-07-02 DIAGNOSIS — M170 Bilateral primary osteoarthritis of knee: Secondary | ICD-10-CM

## 2022-07-09 DIAGNOSIS — M25561 Pain in right knee: Secondary | ICD-10-CM | POA: Diagnosis not present

## 2022-07-09 DIAGNOSIS — M545 Low back pain, unspecified: Secondary | ICD-10-CM | POA: Diagnosis not present

## 2022-07-14 ENCOUNTER — Encounter: Payer: Self-pay | Admitting: *Deleted

## 2022-07-15 ENCOUNTER — Other Ambulatory Visit: Payer: Self-pay | Admitting: Nurse Practitioner

## 2022-07-15 DIAGNOSIS — K7581 Nonalcoholic steatohepatitis (NASH): Secondary | ICD-10-CM | POA: Diagnosis not present

## 2022-07-15 DIAGNOSIS — R188 Other ascites: Secondary | ICD-10-CM | POA: Diagnosis not present

## 2022-07-15 DIAGNOSIS — K3189 Other diseases of stomach and duodenum: Secondary | ICD-10-CM

## 2022-07-15 DIAGNOSIS — K7460 Unspecified cirrhosis of liver: Secondary | ICD-10-CM

## 2022-07-15 DIAGNOSIS — K7682 Hepatic encephalopathy: Secondary | ICD-10-CM | POA: Diagnosis not present

## 2022-07-15 DIAGNOSIS — K766 Portal hypertension: Secondary | ICD-10-CM | POA: Diagnosis not present

## 2022-07-17 DIAGNOSIS — G43909 Migraine, unspecified, not intractable, without status migrainosus: Secondary | ICD-10-CM | POA: Diagnosis not present

## 2022-07-17 DIAGNOSIS — G8929 Other chronic pain: Secondary | ICD-10-CM | POA: Diagnosis not present

## 2022-07-17 DIAGNOSIS — K5909 Other constipation: Secondary | ICD-10-CM | POA: Diagnosis not present

## 2022-07-17 DIAGNOSIS — K7460 Unspecified cirrhosis of liver: Secondary | ICD-10-CM | POA: Diagnosis not present

## 2022-07-17 DIAGNOSIS — Z794 Long term (current) use of insulin: Secondary | ICD-10-CM | POA: Diagnosis not present

## 2022-07-17 DIAGNOSIS — E1169 Type 2 diabetes mellitus with other specified complication: Secondary | ICD-10-CM | POA: Diagnosis not present

## 2022-07-17 DIAGNOSIS — R600 Localized edema: Secondary | ICD-10-CM | POA: Diagnosis not present

## 2022-08-05 ENCOUNTER — Encounter: Payer: Medicare Other | Attending: Registered Nurse | Admitting: Registered Nurse

## 2022-08-05 ENCOUNTER — Encounter: Payer: Self-pay | Admitting: Registered Nurse

## 2022-08-05 VITALS — BP 113/77 | HR 74 | Ht 66.0 in | Wt 283.0 lb

## 2022-08-05 DIAGNOSIS — M1712 Unilateral primary osteoarthritis, left knee: Secondary | ICD-10-CM | POA: Diagnosis not present

## 2022-08-05 DIAGNOSIS — R202 Paresthesia of skin: Secondary | ICD-10-CM | POA: Diagnosis not present

## 2022-08-05 DIAGNOSIS — M1711 Unilateral primary osteoarthritis, right knee: Secondary | ICD-10-CM | POA: Insufficient documentation

## 2022-08-05 DIAGNOSIS — Z5181 Encounter for therapeutic drug level monitoring: Secondary | ICD-10-CM | POA: Insufficient documentation

## 2022-08-05 DIAGNOSIS — Z79891 Long term (current) use of opiate analgesic: Secondary | ICD-10-CM | POA: Diagnosis not present

## 2022-08-05 DIAGNOSIS — Z1231 Encounter for screening mammogram for malignant neoplasm of breast: Secondary | ICD-10-CM | POA: Diagnosis not present

## 2022-08-05 DIAGNOSIS — M47816 Spondylosis without myelopathy or radiculopathy, lumbar region: Secondary | ICD-10-CM | POA: Insufficient documentation

## 2022-08-05 DIAGNOSIS — G894 Chronic pain syndrome: Secondary | ICD-10-CM | POA: Diagnosis not present

## 2022-08-05 MED ORDER — OXYCODONE HCL 10 MG PO TABS: 10.0000 mg | ORAL_TABLET | Freq: Three times a day (TID) | ORAL | 0 refills | Status: DC | PRN
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
## 2022-08-12 DIAGNOSIS — R1319 Other dysphagia: Secondary | ICD-10-CM | POA: Diagnosis not present

## 2022-08-12 DIAGNOSIS — Z794 Long term (current) use of insulin: Secondary | ICD-10-CM | POA: Diagnosis not present

## 2022-08-12 DIAGNOSIS — E1169 Type 2 diabetes mellitus with other specified complication: Secondary | ICD-10-CM | POA: Diagnosis not present

## 2022-08-12 DIAGNOSIS — K7469 Other cirrhosis of liver: Secondary | ICD-10-CM | POA: Diagnosis not present

## 2022-08-18 ENCOUNTER — Ambulatory Visit
Admission: RE | Admit: 2022-08-18 | Discharge: 2022-08-18 | Disposition: A | Discharge status: Home / Self Care | Payer: Medicare Other | Source: Ambulatory Visit | Attending: Nurse Practitioner | Admitting: Nurse Practitioner

## 2022-08-18 DIAGNOSIS — K7682 Hepatic encephalopathy: Secondary | ICD-10-CM

## 2022-08-18 DIAGNOSIS — K7460 Unspecified cirrhosis of liver: Secondary | ICD-10-CM

## 2022-08-18 DIAGNOSIS — K3189 Other diseases of stomach and duodenum: Secondary | ICD-10-CM

## 2022-08-20 ENCOUNTER — Encounter: Payer: Self-pay | Admitting: Emergency Medicine

## 2022-08-20 ENCOUNTER — Other Ambulatory Visit: Payer: Medicare Other

## 2022-08-25 ENCOUNTER — Ambulatory Visit (HOSPITAL_BASED_OUTPATIENT_CLINIC_OR_DEPARTMENT_OTHER)
Admission: RE | Admit: 2022-08-25 | Discharge: 2022-08-25 | Disposition: A | Discharge status: Home / Self Care | Payer: Medicare Other | Source: Ambulatory Visit | Attending: Family Medicine | Admitting: Family Medicine

## 2022-08-25 ENCOUNTER — Other Ambulatory Visit (HOSPITAL_COMMUNITY): Payer: Self-pay

## 2022-08-25 ENCOUNTER — Other Ambulatory Visit (HOSPITAL_COMMUNITY): Payer: Self-pay | Admitting: Family Medicine

## 2022-08-25 DIAGNOSIS — R1032 Left lower quadrant pain: Secondary | ICD-10-CM | POA: Insufficient documentation

## 2022-08-25 DIAGNOSIS — K7469 Other cirrhosis of liver: Secondary | ICD-10-CM | POA: Diagnosis not present

## 2022-08-25 DIAGNOSIS — K5792 Diverticulitis of intestine, part unspecified, without perforation or abscess without bleeding: Secondary | ICD-10-CM | POA: Diagnosis not present

## 2022-08-25 DIAGNOSIS — K6289 Other specified diseases of anus and rectum: Secondary | ICD-10-CM | POA: Diagnosis not present

## 2022-08-25 DIAGNOSIS — K7290 Hepatic failure, unspecified without coma: Secondary | ICD-10-CM | POA: Diagnosis not present

## 2022-08-25 DIAGNOSIS — K5730 Diverticulosis of large intestine without perforation or abscess without bleeding: Secondary | ICD-10-CM | POA: Diagnosis not present

## 2022-08-25 DIAGNOSIS — K7460 Unspecified cirrhosis of liver: Secondary | ICD-10-CM | POA: Diagnosis not present

## 2022-08-25 MED ORDER — IOHEXOL 300 MG/ML  SOLN
100.0000 mL | Freq: Once | INTRAMUSCULAR | Status: AC | PRN
  Administered 2022-08-25: 75 mL via INTRAVENOUS

## 2022-08-25 MED ORDER — GERHARDT'S BUTT CREAM
1.0000 | TOPICAL_CREAM | CUTANEOUS | 2 refills | Status: AC | PRN
  Filled 2022-08-25: qty 1, 30d supply, fill #0
  Filled 2023-07-28: qty 1, 30d supply, fill #1

## 2022-08-26 ENCOUNTER — Other Ambulatory Visit: Payer: Self-pay | Admitting: Registered Nurse

## 2022-08-26 DIAGNOSIS — S8412XS Injury of peroneal nerve at lower leg level, left leg, sequela: Secondary | ICD-10-CM

## 2022-09-01 ENCOUNTER — Ambulatory Visit
Admission: RE | Admit: 2022-09-01 | Discharge: 2022-09-01 | Disposition: A | Discharge status: Home / Self Care | Payer: Medicare Other | Source: Ambulatory Visit | Attending: Nurse Practitioner | Admitting: Nurse Practitioner

## 2022-09-01 ENCOUNTER — Other Ambulatory Visit: Payer: Medicare Other

## 2022-09-02 DIAGNOSIS — K7581 Nonalcoholic steatohepatitis (NASH): Secondary | ICD-10-CM | POA: Diagnosis not present

## 2022-09-02 DIAGNOSIS — Z79899 Other long term (current) drug therapy: Secondary | ICD-10-CM | POA: Diagnosis not present

## 2022-09-02 DIAGNOSIS — E1169 Type 2 diabetes mellitus with other specified complication: Secondary | ICD-10-CM | POA: Diagnosis not present

## 2022-09-03 DIAGNOSIS — E119 Type 2 diabetes mellitus without complications: Secondary | ICD-10-CM | POA: Diagnosis not present

## 2022-09-03 DIAGNOSIS — Z794 Long term (current) use of insulin: Secondary | ICD-10-CM | POA: Diagnosis not present

## 2022-09-03 DIAGNOSIS — K7290 Hepatic failure, unspecified without coma: Secondary | ICD-10-CM | POA: Diagnosis not present

## 2022-09-03 DIAGNOSIS — K7581 Nonalcoholic steatohepatitis (NASH): Secondary | ICD-10-CM | POA: Diagnosis not present

## 2022-09-03 DIAGNOSIS — K529 Noninfective gastroenteritis and colitis, unspecified: Secondary | ICD-10-CM | POA: Diagnosis not present

## 2022-09-03 DIAGNOSIS — N179 Acute kidney failure, unspecified: Secondary | ICD-10-CM | POA: Diagnosis not present

## 2022-09-09 ENCOUNTER — Telehealth: Payer: Self-pay

## 2022-09-09 MED ORDER — OXYCODONE HCL 10 MG PO TABS: 10.0000 mg | ORAL_TABLET | Freq: Three times a day (TID) | ORAL | 0 refills | Status: DC | PRN

## 2022-09-09 NOTE — Telephone Encounter (Signed)
PMP was Reviewed.  Oxycodone e-scribed to pharmacy My Chart message sent to Katherine [name].

## 2022-09-09 NOTE — Telephone Encounter (Signed)
[id number] Katherine [name] Katherine [name] has Oxycodone

## 2022-09-16 ENCOUNTER — Encounter (HOSPITAL_COMMUNITY): Payer: Self-pay | Admitting: Gastroenterology

## 2022-09-16 ENCOUNTER — Other Ambulatory Visit: Payer: Self-pay | Admitting: Gastroenterology

## 2022-09-16 NOTE — Progress Notes (Addendum)
Anesthesia Review:  PCP: Cam Hai  Cardiologist : Carolan Clines- LOV 07/19/21  Endo- DR Talmage Coin-  Chest x-ray : 05/28/22- 2 view  EKG : 04/25/22 .  Echo : 04/25/22  Stress test: 07/26/21  Cardiac Cath :  Activity level:  Sleep Study/ CPAP : no cpap  Fasting Blood Sugar :      / Checks Blood Sugar -- times a day:   Blood Thinner/ Instructions /Last Dose: ASA / Instructions/ Last Dose :    DM- type 2  Dexcom monitor  [name]-  IF pt takes in am day before procedure she will take full dose if takes in the pm day before procedure she will take 1/2 dose  Jardiance-  Hold for 72 hours prior to procedure    Mounjaro- hold for 7 days prior to procedure   Pneumonia- hx of 04/2022    Spoke with son on 09/16/22 since he dispenses and handles meds for pt.  Reviewed with son what meds pt to take am of procedure and holding of certain meds prior to procedure listed in preop call.

## 2022-09-24 ENCOUNTER — Other Ambulatory Visit: Payer: Self-pay | Admitting: Registered Nurse

## 2022-09-24 DIAGNOSIS — M170 Bilateral primary osteoarthritis of knee: Secondary | ICD-10-CM

## 2022-09-24 DIAGNOSIS — G5712 Meralgia paresthetica, left lower limb: Secondary | ICD-10-CM

## 2022-09-27 IMAGING — CR DG LUMBAR SPINE 2-3V
3 series · 3 of 3 positions shown · non-contrast
Comparison: No comparison studies available.

CLINICAL DATA: Back pain.

EXAM:
LUMBAR SPINE - 2-3 VIEW

[w lumbar spine ap]
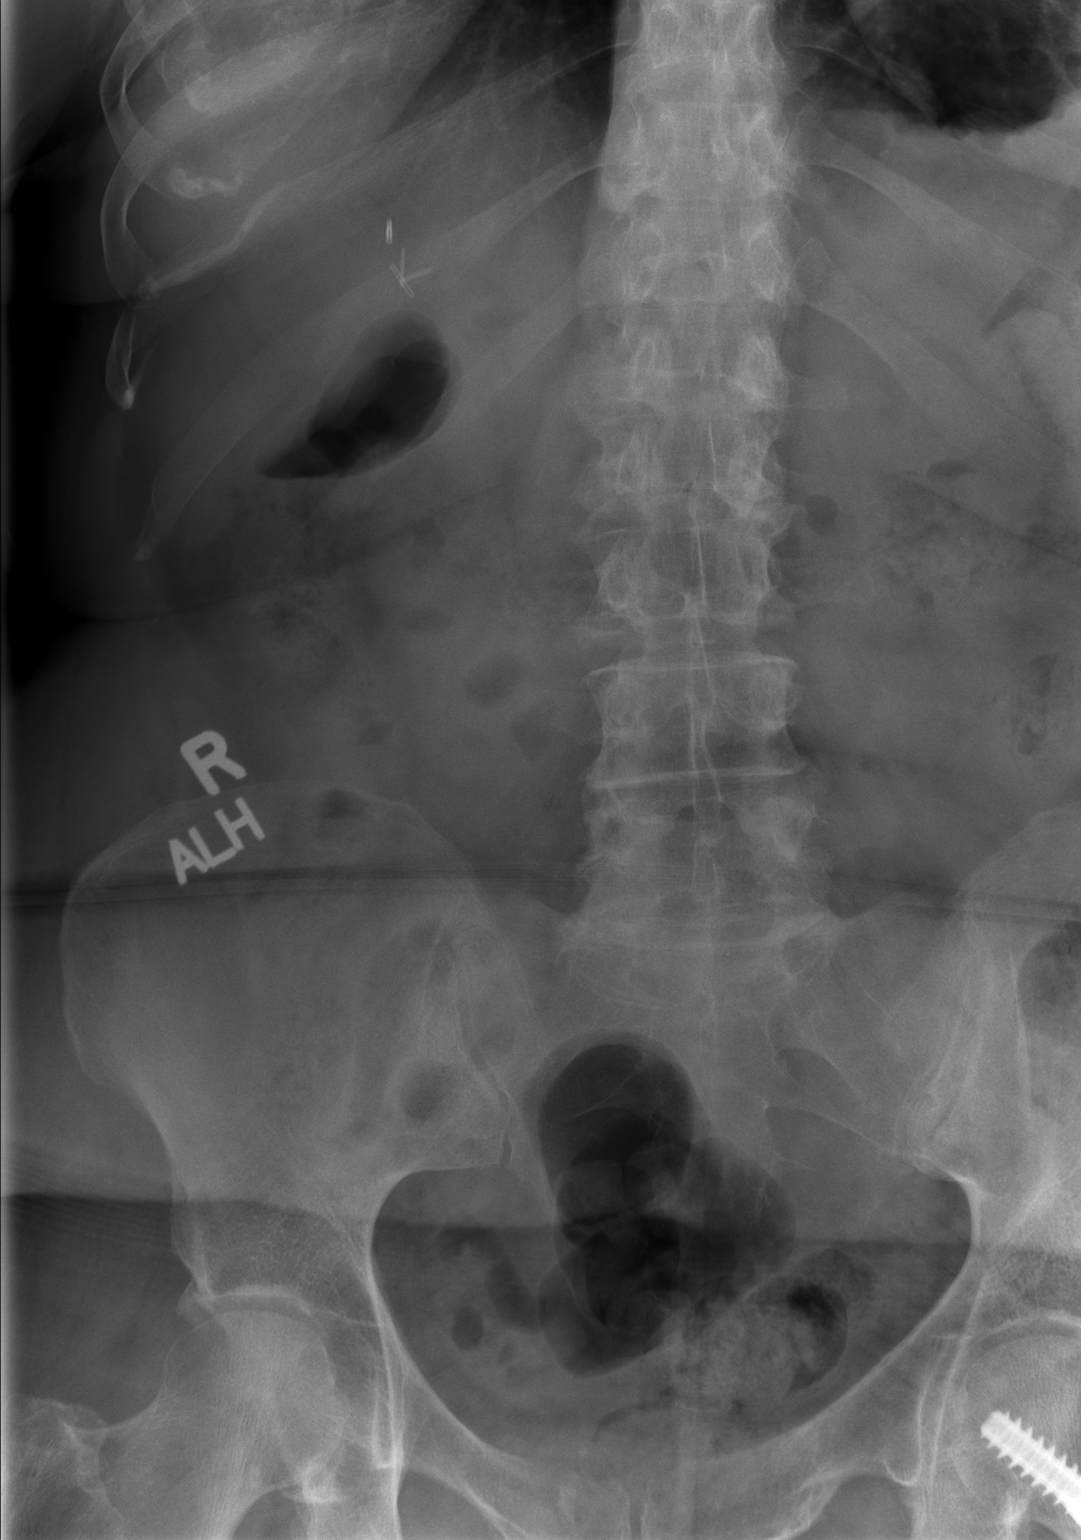

[w lumbar spine lat]
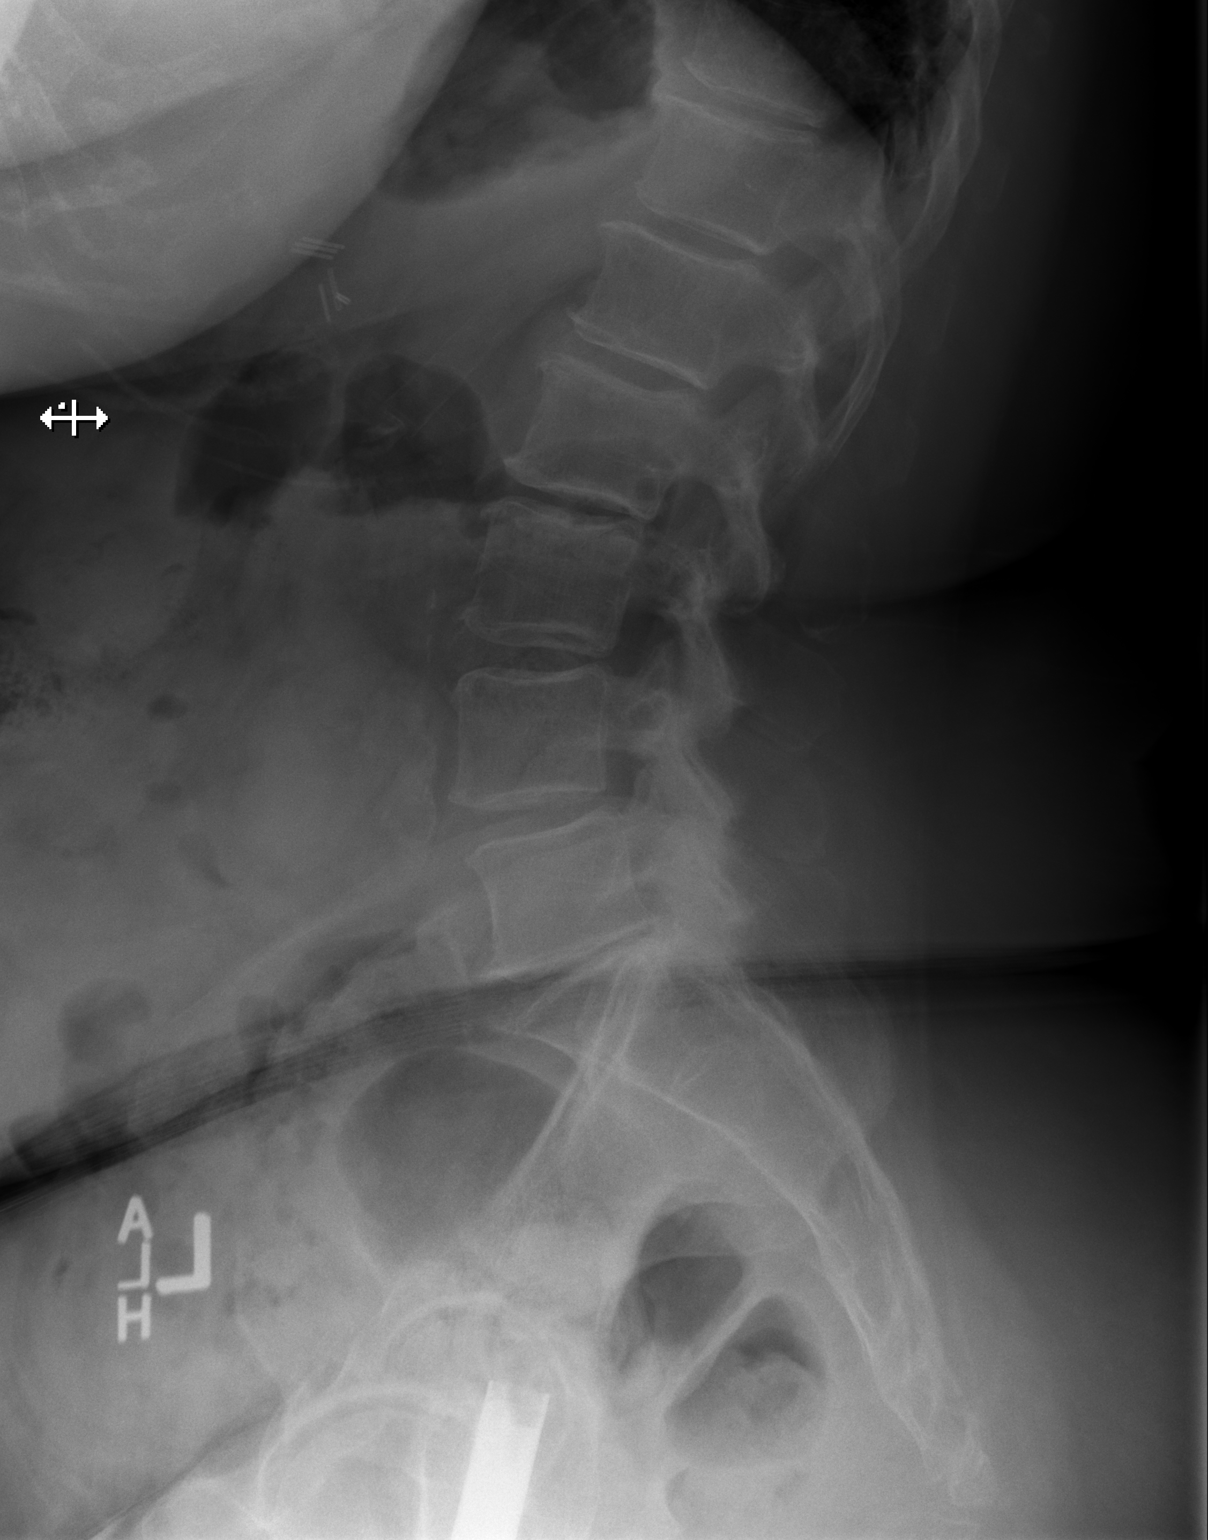

[w lumbar l-5 s-1 spot]
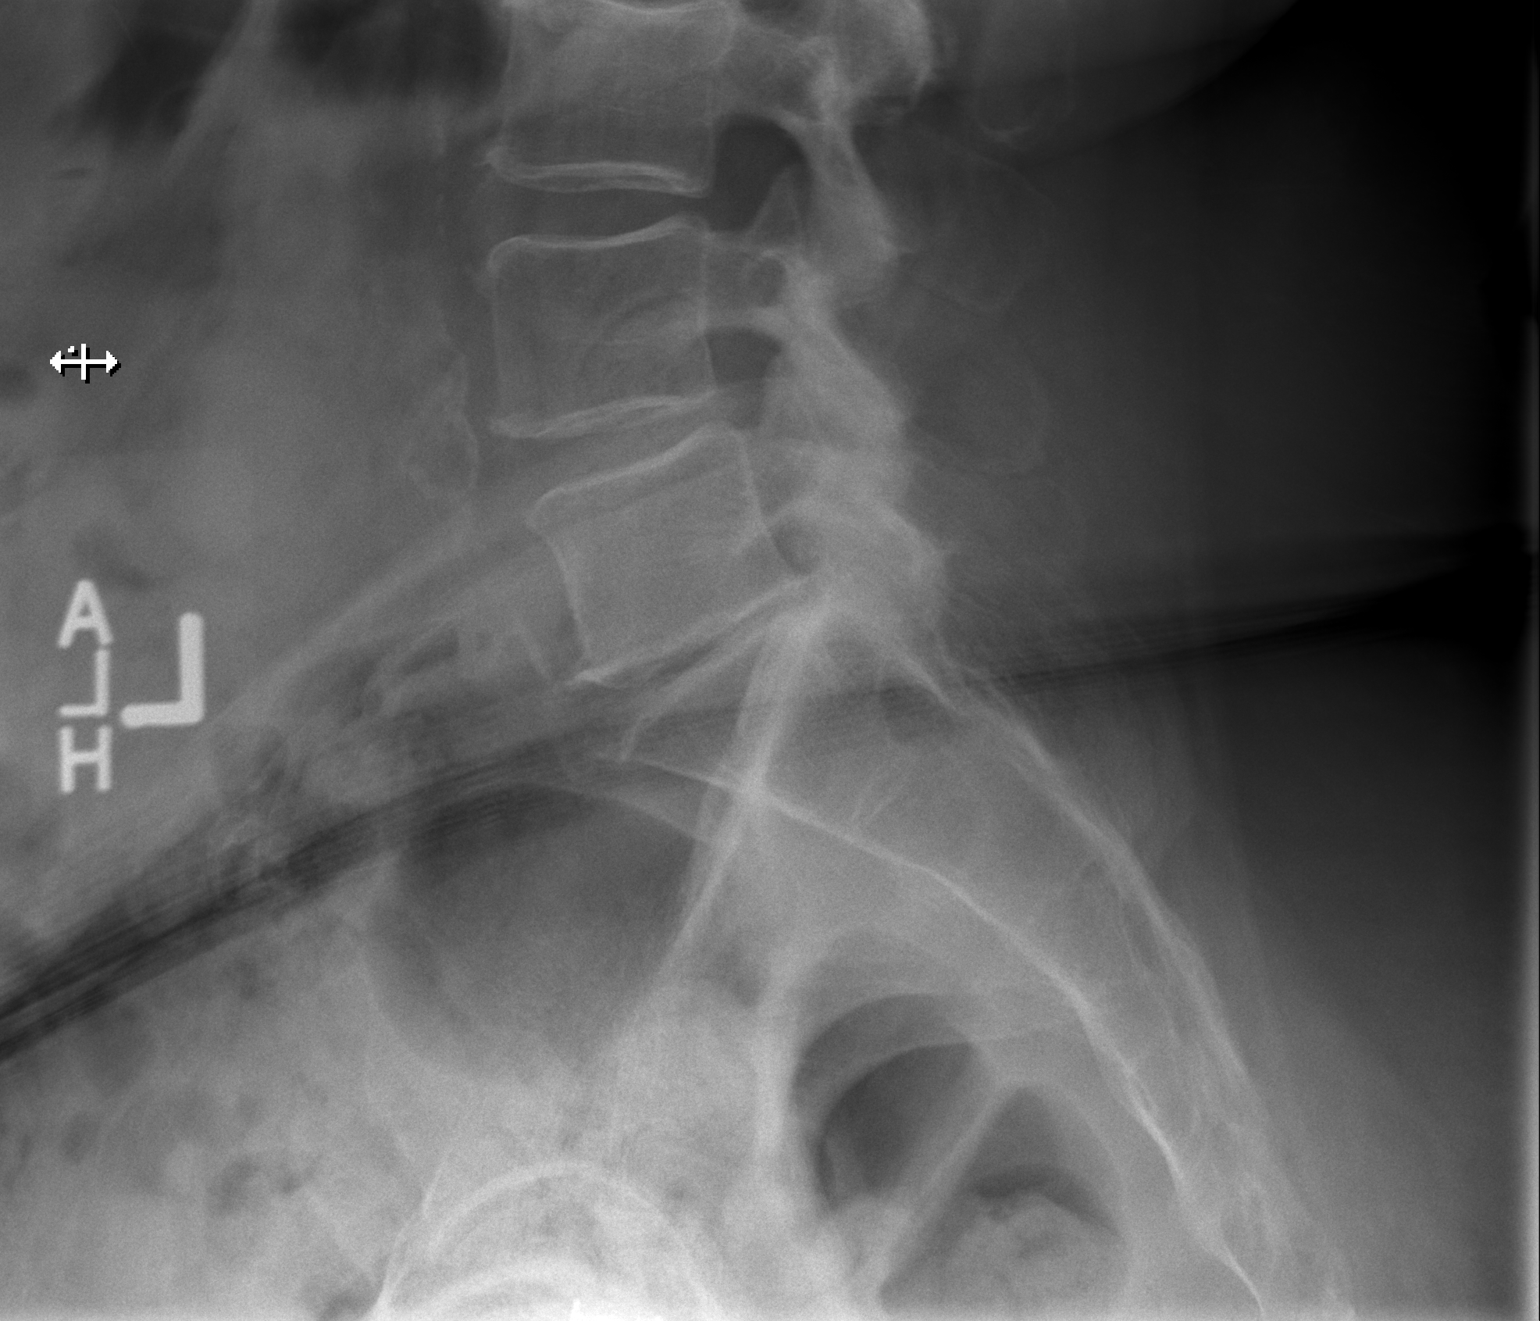

[3 of 3 positions shown; findings below may reference images not displayed]

FINDINGS: Two-view exam shows no fracture. No subluxation. Loss of disc height
noted L2-3 with mild endplate degeneration. Loss of disc height also
evident at L5-S1.No worrisome lytic or sclerotic osseous
abnormality.
IMPRESSION: Degenerative disc disease at L2-3 and L5-S1.
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
## 2022-09-30 ENCOUNTER — Ambulatory Visit (HOSPITAL_COMMUNITY): Payer: Medicare Other | Admitting: Anesthesiology

## 2022-09-30 ENCOUNTER — Encounter (HOSPITAL_COMMUNITY): Payer: Self-pay | Admitting: Gastroenterology

## 2022-09-30 ENCOUNTER — Other Ambulatory Visit: Payer: Self-pay

## 2022-09-30 ENCOUNTER — Encounter (HOSPITAL_COMMUNITY)
Admission: RE | Disposition: A | Discharge status: Home / Self Care | Payer: Self-pay | Source: Home / Self Care | Attending: Gastroenterology

## 2022-09-30 ENCOUNTER — Ambulatory Visit (HOSPITAL_BASED_OUTPATIENT_CLINIC_OR_DEPARTMENT_OTHER): Payer: Medicare Other | Admitting: Anesthesiology

## 2022-09-30 ENCOUNTER — Ambulatory Visit (HOSPITAL_COMMUNITY)
Admission: RE | Admit: 2022-09-30 | Discharge: 2022-09-30 | Disposition: A | Discharge status: Home / Self Care | Payer: Medicare Other | Attending: Gastroenterology | Admitting: Gastroenterology

## 2022-09-30 DIAGNOSIS — K2950 Unspecified chronic gastritis without bleeding: Secondary | ICD-10-CM | POA: Diagnosis not present

## 2022-09-30 DIAGNOSIS — B3781 Candidal esophagitis: Secondary | ICD-10-CM | POA: Diagnosis not present

## 2022-09-30 DIAGNOSIS — Z83719 Family history of colon polyps, unspecified: Secondary | ICD-10-CM | POA: Insufficient documentation

## 2022-09-30 DIAGNOSIS — K7460 Unspecified cirrhosis of liver: Secondary | ICD-10-CM | POA: Insufficient documentation

## 2022-09-30 DIAGNOSIS — K269 Duodenal ulcer, unspecified as acute or chronic, without hemorrhage or perforation: Secondary | ICD-10-CM | POA: Diagnosis not present

## 2022-09-30 DIAGNOSIS — I129 Hypertensive chronic kidney disease with stage 1 through stage 4 chronic kidney disease, or unspecified chronic kidney disease: Secondary | ICD-10-CM

## 2022-09-30 DIAGNOSIS — E785 Hyperlipidemia, unspecified: Secondary | ICD-10-CM | POA: Diagnosis not present

## 2022-09-30 DIAGNOSIS — K259 Gastric ulcer, unspecified as acute or chronic, without hemorrhage or perforation: Secondary | ICD-10-CM

## 2022-09-30 DIAGNOSIS — R1310 Dysphagia, unspecified: Secondary | ICD-10-CM | POA: Insufficient documentation

## 2022-09-30 DIAGNOSIS — K2210 Ulcer of esophagus without bleeding: Secondary | ICD-10-CM | POA: Insufficient documentation

## 2022-09-30 DIAGNOSIS — K31A11 Gastric intestinal metaplasia without dysplasia, involving the antrum: Secondary | ICD-10-CM | POA: Diagnosis not present

## 2022-09-30 DIAGNOSIS — N183 Chronic kidney disease, stage 3 unspecified: Secondary | ICD-10-CM

## 2022-09-30 DIAGNOSIS — Z87891 Personal history of nicotine dependence: Secondary | ICD-10-CM | POA: Diagnosis not present

## 2022-09-30 DIAGNOSIS — K3189 Other diseases of stomach and duodenum: Secondary | ICD-10-CM | POA: Diagnosis not present

## 2022-09-30 DIAGNOSIS — Z7984 Long term (current) use of oral hypoglycemic drugs: Secondary | ICD-10-CM

## 2022-09-30 DIAGNOSIS — K2289 Other specified disease of esophagus: Secondary | ICD-10-CM | POA: Diagnosis not present

## 2022-09-30 DIAGNOSIS — K766 Portal hypertension: Secondary | ICD-10-CM | POA: Diagnosis not present

## 2022-09-30 DIAGNOSIS — K209 Esophagitis, unspecified without bleeding: Secondary | ICD-10-CM | POA: Diagnosis not present

## 2022-09-30 DIAGNOSIS — E1122 Type 2 diabetes mellitus with diabetic chronic kidney disease: Secondary | ICD-10-CM

## 2022-09-30 HISTORY — DX: Pneumonia, unspecified organism: J18.9

## 2022-09-30 HISTORY — PX: ESOPHAGOGASTRODUODENOSCOPY (EGD) WITH PROPOFOL: SHX5813

## 2022-09-30 HISTORY — PX: BIOPSY: SHX5522

## 2022-09-30 LAB — GLUCOSE, CAPILLARY: Glucose-Capillary: 100 mg/dL — ABNORMAL HIGH (ref 70–99)

## 2022-09-30 SURGERY — ESOPHAGOGASTRODUODENOSCOPY (EGD) WITH PROPOFOL
Anesthesia: Monitor Anesthesia Care

## 2022-09-30 MED ORDER — DEXMEDETOMIDINE HCL IN NACL 80 MCG/20ML IV SOLN
INTRAVENOUS | Status: DC | PRN
  Administered 2022-09-30: 8 ug via INTRAVENOUS

## 2022-09-30 MED ORDER — PANTOPRAZOLE SODIUM 40 MG PO TBEC: 40.0000 mg | DELAYED_RELEASE_TABLET | Freq: Two times a day (BID) | ORAL | 0 refills | Status: AC

## 2022-09-30 MED ORDER — PHENYLEPHRINE HCL (PRESSORS) 10 MG/ML IV SOLN
INTRAVENOUS | Status: DC | PRN
Start: 2022-09-30 — End: 2022-09-30
  Administered 2022-09-30: 80 ug via INTRAVENOUS

## 2022-09-30 MED ORDER — PROPOFOL 10 MG/ML IV BOLUS
INTRAVENOUS | Status: DC | PRN
Start: 2022-09-30 — End: 2022-09-30
  Administered 2022-09-30 (×2): 40 mg via INTRAVENOUS

## 2022-09-30 MED ORDER — LACTATED RINGERS IV SOLN: INTRAVENOUS | Status: DC | PRN

## 2022-09-30 MED ORDER — LIDOCAINE HCL (CARDIAC) PF 100 MG/5ML IV SOSY
PREFILLED_SYRINGE | INTRAVENOUS | Status: DC | PRN
  Administered 2022-09-30: 80 mg via INTRATRACHEAL

## 2022-09-30 MED ORDER — PROPOFOL 500 MG/50ML IV EMUL
INTRAVENOUS | Status: DC | PRN
  Administered 2022-09-30: 75 ug/kg/min via INTRAVENOUS

## 2022-09-30 MED ORDER — LACTATED RINGERS IV SOLN: INTRAVENOUS | Status: DC

## 2022-09-30 SURGICAL SUPPLY — 15 items

## 2022-09-30 NOTE — Anesthesia Preprocedure Evaluation (Signed)
Anesthesia Evaluation  Patient identified by MRN, date of birth, ID band Patient awake    Reviewed: Allergy & Precautions, H&P , NPO status , Patient's Chart, lab work & pertinent test results  Airway Mallampati: II   Neck ROM: full    Dental   Pulmonary sleep apnea , former smoker   breath sounds clear to auscultation       Cardiovascular hypertension,  Rhythm:regular Rate:Normal     Neuro/Psych  Headaches, Seizures -,  PSYCHIATRIC DISORDERS Anxiety Depression       GI/Hepatic PUD,GERD  ,,(+) Cirrhosis   Esophageal Varices      Endo/Other  diabetes, Type 2Hypothyroidism  Morbid obesity  Renal/GU stones     Musculoskeletal  (+) Arthritis ,    Abdominal   Peds  Hematology   Anesthesia Other Findings   Reproductive/Obstetrics                             Anesthesia Physical Anesthesia Plan  ASA: 3  Anesthesia Plan: MAC   Post-op Pain Management:    Induction: Intravenous  PONV Risk Score and Plan: 2 and Propofol infusion and Treatment may vary due to age or medical condition  Airway Management Planned: Nasal Cannula  Additional Equipment:   Intra-op Plan:   Post-operative Plan:   Informed Consent: I have reviewed the patients History and Physical, chart, labs and discussed the procedure including the risks, benefits and alternatives for the proposed anesthesia with the patient or authorized representative who has indicated his/her understanding and acceptance.     Dental advisory given  Plan Discussed with: CRNA, Anesthesiologist and Surgeon  Anesthesia Plan Comments:        Anesthesia Quick Evaluation

## 2022-09-30 NOTE — Op Note (Signed)
[facility] Patient Name: Katherine [name] Procedure Date: 09/30/2022 MRN: [id number] Attending MD: Kerin Salen , MD, [id number] Date of Birth: 06-Sep-1955 CSN: [id number] Age: 67 Admit Type: Outpatient Procedure:                Upper GI endoscopy Indications:              Dysphagia, Cirrhosis rule out esophageal varices Providers:                Kerin Salen, MD, Norman Clay, RN, Beryle Beams,                            Technician Referring MD:             Armandina Stammer Medicines:                Monitored Anesthesia Care Complications:            No immediate complications. Estimated blood loss:                            Minimal. Estimated Blood Loss:     Estimated blood loss was minimal. Procedure:                Pre-Anesthesia Assessment:                           - Prior to the procedure, a History and Physical                            was performed, and patient medications and                            allergies were reviewed. The patient's tolerance of                            previous anesthesia was also reviewed. The risks                            and benefits of the procedure and the sedation                            options and risks were discussed with the patient.                            All questions were answered, and informed consent                            was obtained. Prior Anticoagulants: The patient has                            taken no anticoagulant or antiplatelet agents. ASA                            Grade Assessment: III - A patient with severe  systemic disease. After reviewing the risks and                            benefits, the patient was deemed in satisfactory                            condition to undergo the procedure.                           After obtaining informed consent, the endoscope was                            passed under direct vision. Throughout the                             procedure, the patient's blood pressure, pulse, and                            oxygen saturations were monitored continuously. The                            GIF-H190 ([id number]) Olympus endoscope was introduced                            through the mouth, and advanced to the second part                            of duodenum. The GIF-H190 ([id number]) Olympus                            endoscope was introduced through the and advanced                            to the. The upper GI endoscopy was accomplished                            without difficulty. The patient tolerated the                            procedure well. Scope In: Scope Out: Findings:      Localized severe mucosal changes characterized by nodularity and       ulceration were found at the gastroesophageal junction. Biopsies were       taken with a cold forceps for histology.      Biopsies were obtained from the proximal and distal esophagus with cold       forceps for histology of suspected eosinophilic esophagitis.      The esophageal mucosa was covered with yellowish slime like material       ?recently ingested food vs medications.      There was mild resistance noted during passage of the scope.      Multiple localized diminutive erosions with no bleeding and no stigmata       of recent bleeding were found in the gastric body. Biopsies were taken       with a cold forceps for  Helicobacter pylori testing.      The cardia and gastric fundus were normal on retroflexion.      Many non-bleeding superficial duodenal ulcers with pigmented material       were found in the duodenal bulb, in the first portion of the duodenum       and in the second portion of the duodenum. The largest lesion was 7 mm       in largest dimension.      There was no evidence of esophageal varices although mild portal       hypertensive gastropathy was noted in the stomach. Impression:               - Nodular, ulcerated mucosa in the esophagus.                             Biopsied.                           - Erosive gastropathy with no bleeding and no                            stigmata of recent bleeding. Biopsied.                           - Non-bleeding duodenal ulcers with pigmented                            material.                           - Biopsies were taken with a cold forceps for                            evaluation of eosinophilic esophagitis. Moderate Sedation:      Patient did not receive moderate sedation for this procedure, but       instead received monitored anesthesia care. Recommendation:           - Patient has a contact number available for                            emergencies. The signs and symptoms of potential                            delayed complications were discussed with the                            patient. Return to normal activities tomorrow.                            Written discharge instructions were provided to the                            patient.                           - Resume regular diet.                           -  Continue present medications.                           - Await pathology results.                           - Use Protonix (pantoprazole) 40 mg PO BID for 2                            months. Procedure Code(s):        --- Professional ---                           [phone], Esophagogastroduodenoscopy, flexible,                            transoral; with biopsy, single or multiple Diagnosis Code(s):        --- Professional ---                           K22.89, Other specified disease of esophagus                           K22.10, Ulcer of esophagus without bleeding                           K31.89, Other diseases of stomach and duodenum                           K26.9, Duodenal ulcer, unspecified as acute or                            chronic, without hemorrhage or perforation                           R13.10, Dysphagia, unspecified                           K74.60,  Unspecified cirrhosis of liver CPT copyright 2022 [employer]. All rights reserved. The codes documented in this report are preliminary and upon coder review may  be revised to meet current compliance requirements. Kerin Salen, MD 09/30/2022 9:15:09 AM This report has been signed electronically. Number of Addenda: 0

## 2022-09-30 NOTE — Interval H&P Note (Signed)
History and Physical Interval Note: 66/female with cirrhosis and dysphagia for variceal screening and possible balloon dilation with propofol.  09/30/2022 8:39 AM  [name]  has presented today for cirrhosis and dysphagia, with the diagnosis of cirrhosis of liver.  The various methods of treatment have been discussed with the patient and family. After consideration of risks, benefits and other options for treatment, the patient has consented to  Procedure(s) with comments: ESOPHAGOGASTRODUODENOSCOPY (EGD) WITH PROPOFOL (N/A) - With possible banding and possible dilation as a surgical intervention.  The patient's history has been reviewed, patient examined, no change in status, stable for surgery.  I have reviewed the patient's chart and labs.  Questions were answered to the patient's satisfaction.     Kerin Salen

## 2022-09-30 NOTE — Transfer of Care (Signed)
Immediate Anesthesia Transfer of Care Note  Patient: Katherine [name]  Procedure(s) Performed: ESOPHAGOGASTRODUODENOSCOPY (EGD) WITH PROPOFOL BIOPSY  Patient Location: PACU  Anesthesia Type:MAC  Level of Consciousness: awake and alert   Airway & Oxygen Therapy: Patient Spontanous Breathing and Patient connected to face mask oxygen  Post-op Assessment: Report given to RN and Post -op Vital signs reviewed and stable  Post vital signs: Reviewed and stable  Last Vitals:  Vitals Value Taken Time  BP 110/44 09/30/22 0916  Temp 36.1 C 09/30/22 0916  Pulse 59 09/30/22 0918  Resp 12 09/30/22 0918  SpO2 100 % 09/30/22 0918  Vitals shown include unfiled device data.  Last Pain:  Vitals:   09/30/22 0916  TempSrc: Temporal  PainSc: 0-No pain         Complications: No notable events documented.

## 2022-09-30 NOTE — Discharge Instructions (Signed)
YOU HAD AN ENDOSCOPIC PROCEDURE TODAY: Refer to the procedure report and other information in the discharge instructions given to you for any specific questions about what was found during the examination. If this information does not answer your questions, please call [place] office at [phone] to clarify.   YOU SHOULD EXPECT: Some feelings of bloating in the abdomen. Passage of more gas than usual. Walking can help get rid of the air that was put into your GI tract during the procedure and reduce the bloating. If you had a lower endoscopy (such as a colonoscopy or flexible sigmoidoscopy) you may notice spotting of blood in your stool or on the toilet paper. Some abdominal soreness may be present for a day or two, also.  DIET: Your first meal following the procedure should be a light meal and then it is ok to progress to your normal diet. A half-sandwich or bowl of soup is an example of a good first meal. Heavy or fried foods are harder to digest and may make you feel nauseous or bloated. Drink plenty of fluids but you should avoid alcoholic beverages for 24 hours. If you had a esophageal dilation, please see attached instructions for diet.    ACTIVITY: Your care partner should take you home directly after the procedure. You should plan to take it easy, moving slowly for the rest of the day. You can resume normal activity the day after the procedure however YOU SHOULD NOT DRIVE, use power tools, machinery or perform tasks that involve climbing or major physical exertion for 24 hours (because of the sedation medicines used during the test).   SYMPTOMS TO REPORT IMMEDIATELY: A gastroenterologist can be reached at any hour. Please call [phone]  for any of the following symptoms:   Following upper endoscopy (EGD, EUS, ERCP, esophageal dilation) Vomiting of blood or coffee ground material  New, significant abdominal pain  New, significant chest pain or pain under the shoulder blades  Painful or  persistently difficult swallowing  New shortness of breath  Black, tarry-looking or red, bloody stools  FOLLOW UP:  If any biopsies were taken you will be contacted by phone or by letter within the next 1-3 weeks. Call [phone]  if you have not heard about the biopsies in 3 weeks.  Please also call with any specific questions about appointments or follow up tests.

## 2022-10-01 NOTE — Anesthesia Postprocedure Evaluation (Signed)
Anesthesia Post Note  Patient: Katherine [name]  Procedure(s) Performed: ESOPHAGOGASTRODUODENOSCOPY (EGD) WITH PROPOFOL BIOPSY     Patient location during evaluation: PACU Anesthesia Type: MAC Level of consciousness: awake and alert Pain management: pain level controlled Vital Signs Assessment: post-procedure vital signs reviewed and stable Respiratory status: spontaneous breathing, nonlabored ventilation, respiratory function stable and patient connected to nasal cannula oxygen Cardiovascular status: stable and blood pressure returned to baseline Postop Assessment: no apparent nausea or vomiting Anesthetic complications: no   No notable events documented.  Last Vitals:  Vitals:   09/30/22 0920 09/30/22 0930  BP: (!) 105/47 (!) 107/50  Pulse: 63 (!) 56  Resp: 14 12  Temp:    SpO2: 100% 90%    Last Pain:  Vitals:   09/30/22 0930  TempSrc:   PainSc: 0-No pain                 Axiel Fjeld S

## 2022-10-02 ENCOUNTER — Encounter (HOSPITAL_COMMUNITY): Payer: Self-pay | Admitting: Gastroenterology

## 2022-10-07 ENCOUNTER — Encounter: Payer: Medicare Other | Admitting: Registered Nurse

## 2022-10-10 ENCOUNTER — Encounter (HOSPITAL_COMMUNITY): Payer: Self-pay | Admitting: Gastroenterology

## 2022-10-15 DIAGNOSIS — M25561 Pain in right knee: Secondary | ICD-10-CM | POA: Diagnosis not present

## 2022-10-16 ENCOUNTER — Encounter: Payer: Medicare Other | Admitting: Registered Nurse

## 2022-10-17 ENCOUNTER — Encounter: Payer: Medicare Other | Attending: Registered Nurse | Admitting: Registered Nurse

## 2022-10-17 ENCOUNTER — Encounter: Payer: Self-pay | Admitting: Registered Nurse

## 2022-10-17 VITALS — BP 132/81 | HR 80 | Ht 66.0 in | Wt 277.0 lb

## 2022-10-17 DIAGNOSIS — Z5181 Encounter for therapeutic drug level monitoring: Secondary | ICD-10-CM | POA: Diagnosis not present

## 2022-10-17 DIAGNOSIS — G894 Chronic pain syndrome: Secondary | ICD-10-CM | POA: Diagnosis not present

## 2022-10-17 DIAGNOSIS — S8412XS Injury of peroneal nerve at lower leg level, left leg, sequela: Secondary | ICD-10-CM | POA: Diagnosis not present

## 2022-10-17 DIAGNOSIS — M1711 Unilateral primary osteoarthritis, right knee: Secondary | ICD-10-CM | POA: Insufficient documentation

## 2022-10-17 DIAGNOSIS — M47816 Spondylosis without myelopathy or radiculopathy, lumbar region: Secondary | ICD-10-CM | POA: Diagnosis not present

## 2022-10-17 DIAGNOSIS — R202 Paresthesia of skin: Secondary | ICD-10-CM | POA: Diagnosis not present

## 2022-10-17 DIAGNOSIS — Z79891 Long term (current) use of opiate analgesic: Secondary | ICD-10-CM | POA: Insufficient documentation

## 2022-10-17 MED ORDER — OXYCODONE HCL 10 MG PO TABS: 10.0000 mg | ORAL_TABLET | Freq: Three times a day (TID) | ORAL | 0 refills | Status: DC | PRN

## 2022-10-17 MED ORDER — GABAPENTIN 300 MG PO CAPS
300.0000 mg | ORAL_CAPSULE | Freq: Two times a day (BID) | ORAL | 2 refills | Status: DC
Start: 2022-10-17 — End: 2023-05-22
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
## 2022-10-17 NOTE — Patient Instructions (Signed)
Katherine [name] prescription for Oxycodone was sent to pharmacy:  August and September.  Her next scheduled appointment is in October

## 2022-10-21 DIAGNOSIS — E039 Hypothyroidism, unspecified: Secondary | ICD-10-CM | POA: Diagnosis not present

## 2022-10-21 DIAGNOSIS — Z794 Long term (current) use of insulin: Secondary | ICD-10-CM | POA: Diagnosis not present

## 2022-10-21 DIAGNOSIS — E1169 Type 2 diabetes mellitus with other specified complication: Secondary | ICD-10-CM | POA: Diagnosis not present

## 2022-10-21 DIAGNOSIS — E782 Mixed hyperlipidemia: Secondary | ICD-10-CM | POA: Diagnosis not present

## 2022-10-21 DIAGNOSIS — Z79899 Other long term (current) drug therapy: Secondary | ICD-10-CM | POA: Diagnosis not present

## 2022-10-21 DIAGNOSIS — R300 Dysuria: Secondary | ICD-10-CM | POA: Diagnosis not present

## 2022-10-21 DIAGNOSIS — K7460 Unspecified cirrhosis of liver: Secondary | ICD-10-CM | POA: Diagnosis not present

## 2022-10-29 DIAGNOSIS — E1169 Type 2 diabetes mellitus with other specified complication: Secondary | ICD-10-CM | POA: Diagnosis not present

## 2022-10-29 DIAGNOSIS — K7290 Hepatic failure, unspecified without coma: Secondary | ICD-10-CM | POA: Diagnosis not present

## 2022-10-29 DIAGNOSIS — N182 Chronic kidney disease, stage 2 (mild): Secondary | ICD-10-CM | POA: Diagnosis not present

## 2022-10-29 DIAGNOSIS — K219 Gastro-esophageal reflux disease without esophagitis: Secondary | ICD-10-CM | POA: Diagnosis not present

## 2022-10-29 DIAGNOSIS — E039 Hypothyroidism, unspecified: Secondary | ICD-10-CM | POA: Diagnosis not present

## 2022-10-29 DIAGNOSIS — N179 Acute kidney failure, unspecified: Secondary | ICD-10-CM | POA: Diagnosis not present

## 2022-10-29 DIAGNOSIS — G8929 Other chronic pain: Secondary | ICD-10-CM | POA: Diagnosis not present

## 2022-10-29 DIAGNOSIS — E782 Mixed hyperlipidemia: Secondary | ICD-10-CM | POA: Diagnosis not present

## 2022-10-29 DIAGNOSIS — I129 Hypertensive chronic kidney disease with stage 1 through stage 4 chronic kidney disease, or unspecified chronic kidney disease: Secondary | ICD-10-CM | POA: Diagnosis not present

## 2022-10-29 DIAGNOSIS — K7460 Unspecified cirrhosis of liver: Secondary | ICD-10-CM | POA: Diagnosis not present

## 2022-10-29 DIAGNOSIS — E1122 Type 2 diabetes mellitus with diabetic chronic kidney disease: Secondary | ICD-10-CM | POA: Diagnosis not present

## 2022-11-05 DIAGNOSIS — M1711 Unilateral primary osteoarthritis, right knee: Secondary | ICD-10-CM | POA: Diagnosis not present

## 2022-11-10 DIAGNOSIS — H35363 Drusen (degenerative) of macula, bilateral: Secondary | ICD-10-CM | POA: Diagnosis not present

## 2022-11-17 DIAGNOSIS — M810 Age-related osteoporosis without current pathological fracture: Secondary | ICD-10-CM | POA: Diagnosis not present

## 2022-11-17 DIAGNOSIS — E119 Type 2 diabetes mellitus without complications: Secondary | ICD-10-CM | POA: Diagnosis not present

## 2022-11-17 DIAGNOSIS — Z794 Long term (current) use of insulin: Secondary | ICD-10-CM | POA: Diagnosis not present

## 2022-11-17 DIAGNOSIS — N179 Acute kidney failure, unspecified: Secondary | ICD-10-CM | POA: Diagnosis not present

## 2022-12-02 DIAGNOSIS — I129 Hypertensive chronic kidney disease with stage 1 through stage 4 chronic kidney disease, or unspecified chronic kidney disease: Secondary | ICD-10-CM | POA: Diagnosis not present

## 2022-12-02 DIAGNOSIS — K7290 Hepatic failure, unspecified without coma: Secondary | ICD-10-CM | POA: Diagnosis not present

## 2022-12-02 DIAGNOSIS — N182 Chronic kidney disease, stage 2 (mild): Secondary | ICD-10-CM | POA: Diagnosis not present

## 2022-12-02 DIAGNOSIS — K7581 Nonalcoholic steatohepatitis (NASH): Secondary | ICD-10-CM | POA: Diagnosis not present

## 2022-12-02 DIAGNOSIS — N179 Acute kidney failure, unspecified: Secondary | ICD-10-CM | POA: Diagnosis not present

## 2022-12-02 DIAGNOSIS — K219 Gastro-esophageal reflux disease without esophagitis: Secondary | ICD-10-CM | POA: Diagnosis not present

## 2022-12-02 DIAGNOSIS — G8929 Other chronic pain: Secondary | ICD-10-CM | POA: Diagnosis not present

## 2022-12-02 DIAGNOSIS — Z23 Encounter for immunization: Secondary | ICD-10-CM | POA: Diagnosis not present

## 2022-12-02 DIAGNOSIS — E1122 Type 2 diabetes mellitus with diabetic chronic kidney disease: Secondary | ICD-10-CM | POA: Diagnosis not present

## 2022-12-02 DIAGNOSIS — E039 Hypothyroidism, unspecified: Secondary | ICD-10-CM | POA: Diagnosis not present

## 2022-12-02 DIAGNOSIS — E782 Mixed hyperlipidemia: Secondary | ICD-10-CM | POA: Diagnosis not present

## 2022-12-08 DIAGNOSIS — E1169 Type 2 diabetes mellitus with other specified complication: Secondary | ICD-10-CM | POA: Diagnosis not present

## 2022-12-08 DIAGNOSIS — K760 Fatty (change of) liver, not elsewhere classified: Secondary | ICD-10-CM | POA: Diagnosis not present

## 2022-12-08 DIAGNOSIS — Z87898 Personal history of other specified conditions: Secondary | ICD-10-CM | POA: Diagnosis not present

## 2022-12-08 DIAGNOSIS — K7581 Nonalcoholic steatohepatitis (NASH): Secondary | ICD-10-CM | POA: Diagnosis not present

## 2022-12-08 DIAGNOSIS — Z794 Long term (current) use of insulin: Secondary | ICD-10-CM | POA: Diagnosis not present

## 2022-12-08 DIAGNOSIS — K7460 Unspecified cirrhosis of liver: Secondary | ICD-10-CM | POA: Diagnosis not present

## 2022-12-09 DIAGNOSIS — H35341 Macular cyst, hole, or pseudohole, right eye: Secondary | ICD-10-CM | POA: Diagnosis not present

## 2022-12-10 DIAGNOSIS — M1711 Unilateral primary osteoarthritis, right knee: Secondary | ICD-10-CM | POA: Diagnosis not present
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
## 2022-12-11 ENCOUNTER — Encounter: Payer: Self-pay | Admitting: Registered Nurse

## 2022-12-11 ENCOUNTER — Encounter: Payer: Medicare Other | Attending: Registered Nurse | Admitting: Registered Nurse

## 2022-12-11 VITALS — BP 119/77 | HR 100 | Ht 66.0 in | Wt 281.0 lb

## 2022-12-11 DIAGNOSIS — F419 Anxiety disorder, unspecified: Secondary | ICD-10-CM | POA: Diagnosis not present

## 2022-12-11 DIAGNOSIS — M1712 Unilateral primary osteoarthritis, left knee: Secondary | ICD-10-CM | POA: Insufficient documentation

## 2022-12-11 DIAGNOSIS — W19XXXD Unspecified fall, subsequent encounter: Secondary | ICD-10-CM | POA: Insufficient documentation

## 2022-12-11 DIAGNOSIS — G894 Chronic pain syndrome: Secondary | ICD-10-CM | POA: Diagnosis not present

## 2022-12-11 DIAGNOSIS — Z5181 Encounter for therapeutic drug level monitoring: Secondary | ICD-10-CM | POA: Diagnosis not present

## 2022-12-11 DIAGNOSIS — F329 Major depressive disorder, single episode, unspecified: Secondary | ICD-10-CM | POA: Insufficient documentation

## 2022-12-11 DIAGNOSIS — M545 Low back pain, unspecified: Secondary | ICD-10-CM | POA: Insufficient documentation

## 2022-12-11 DIAGNOSIS — M7541 Impingement syndrome of right shoulder: Secondary | ICD-10-CM | POA: Diagnosis not present

## 2022-12-11 DIAGNOSIS — H43813 Vitreous degeneration, bilateral: Secondary | ICD-10-CM | POA: Diagnosis not present

## 2022-12-11 DIAGNOSIS — G5710 Meralgia paresthetica, unspecified lower limb: Secondary | ICD-10-CM | POA: Diagnosis not present

## 2022-12-11 DIAGNOSIS — H35341 Macular cyst, hole, or pseudohole, right eye: Secondary | ICD-10-CM | POA: Diagnosis not present

## 2022-12-11 DIAGNOSIS — Z79891 Long term (current) use of opiate analgesic: Secondary | ICD-10-CM | POA: Diagnosis not present

## 2022-12-11 DIAGNOSIS — Y92009 Unspecified place in unspecified non-institutional (private) residence as the place of occurrence of the external cause: Secondary | ICD-10-CM | POA: Insufficient documentation

## 2022-12-11 DIAGNOSIS — H35052 Retinal neovascularization, unspecified, left eye: Secondary | ICD-10-CM | POA: Diagnosis not present

## 2022-12-11 DIAGNOSIS — M47816 Spondylosis without myelopathy or radiculopathy, lumbar region: Secondary | ICD-10-CM | POA: Diagnosis not present

## 2022-12-11 DIAGNOSIS — M170 Bilateral primary osteoarthritis of knee: Secondary | ICD-10-CM | POA: Insufficient documentation

## 2022-12-11 DIAGNOSIS — H2511 Age-related nuclear cataract, right eye: Secondary | ICD-10-CM | POA: Diagnosis not present

## 2022-12-11 DIAGNOSIS — S066XAA Traumatic subarachnoid hemorrhage with loss of consciousness status unknown, initial encounter: Secondary | ICD-10-CM | POA: Insufficient documentation

## 2022-12-11 DIAGNOSIS — R202 Paresthesia of skin: Secondary | ICD-10-CM | POA: Insufficient documentation

## 2022-12-11 DIAGNOSIS — Z760 Encounter for issue of repeat prescription: Secondary | ICD-10-CM | POA: Insufficient documentation

## 2022-12-11 DIAGNOSIS — M1711 Unilateral primary osteoarthritis, right knee: Secondary | ICD-10-CM | POA: Diagnosis not present

## 2022-12-11 MED ORDER — OXYCODONE HCL 10 MG PO TABS: 10.0000 mg | ORAL_TABLET | Freq: Three times a day (TID) | ORAL | 0 refills | Status: DC | PRN

## 2022-12-11 NOTE — Patient Instructions (Signed)
Have your son Katherine [name] call me regarding your medication.   [phone]

## 2022-12-12 ENCOUNTER — Telehealth: Payer: Self-pay | Admitting: Specialist

## 2022-12-12 NOTE — Telephone Encounter (Signed)
Patient of Jacalyn Lefevre.  Riley Lam please give son Zeniyah Peaster a call back at [phone]. Shirlean Mylar, MHA, OT/L [phone]

## 2022-12-12 NOTE — Telephone Encounter (Signed)
Return Mr. Lewey call ( son  Katherine [name]), we discussed Katherine [name] medication  ( oxycodone). Katherine [name] Oxycodone will remain the same especially with her recent fall. He verbalizes understanding. He will call office with update on Katherine [name] pain.

## 2022-12-21 LAB — DRUG TOX MONITOR 1 W/CONF, ORAL FLD
Amphetamines: NEGATIVE ng/mL (ref <10)
Barbiturates: NEGATIVE ng/mL (ref <10)
Benzodiazepines: NEGATIVE ng/mL (ref <0.50)
Buprenorphine: NEGATIVE ng/mL (ref <0.10)
Cocaine: NEGATIVE ng/mL (ref <5.0)
Codeine: NEGATIVE ng/mL (ref <2.5)
Dihydrocodeine: NEGATIVE ng/mL (ref <2.5)
Fentanyl: NEGATIVE ng/mL (ref <0.10)
Heroin Metabolite: NEGATIVE ng/mL (ref <1.0)
Hydrocodone: NEGATIVE ng/mL (ref <2.5)
Hydromorphone: NEGATIVE ng/mL (ref <2.5)
MARIJUANA: NEGATIVE ng/mL (ref <2.5)
MDMA: NEGATIVE ng/mL (ref <10)
Meprobamate: NEGATIVE ng/mL (ref <2.5)
Methadone: NEGATIVE ng/mL (ref <5.0)
Morphine: NEGATIVE ng/mL (ref <2.5)
Nicotine Metabolite: NEGATIVE ng/mL (ref <5.0)
Norhydrocodone: NEGATIVE ng/mL (ref <2.5)
Noroxycodone: 51.3 ng/mL — ABNORMAL HIGH (ref <2.5)
Opiates: POSITIVE ng/mL — AB (ref <2.5)
Oxycodone: 222.7 ng/mL — ABNORMAL HIGH (ref <2.5)
Oxymorphone: NEGATIVE ng/mL (ref <2.5)
Phencyclidine: NEGATIVE ng/mL (ref <10)
Tapentadol: NEGATIVE ng/mL (ref <5.0)
Tramadol: NEGATIVE ng/mL (ref <5.0)
Zolpidem: NEGATIVE ng/mL (ref <5.0)

## 2022-12-21 LAB — DRUG TOX ALC METAB W/CON, ORAL FLD: Alcohol Metabolite: NEGATIVE ng/mL (ref <25)

## 2022-12-28 ENCOUNTER — Other Ambulatory Visit: Payer: Self-pay | Admitting: Registered Nurse

## 2022-12-28 DIAGNOSIS — G5712 Meralgia paresthetica, left lower limb: Secondary | ICD-10-CM

## 2022-12-28 DIAGNOSIS — M170 Bilateral primary osteoarthritis of knee: Secondary | ICD-10-CM

## 2022-12-30 DIAGNOSIS — H35341 Macular cyst, hole, or pseudohole, right eye: Secondary | ICD-10-CM | POA: Diagnosis not present

## 2022-12-30 DIAGNOSIS — H26491 Other secondary cataract, right eye: Secondary | ICD-10-CM | POA: Diagnosis not present

## 2022-12-31 DIAGNOSIS — S93492A Sprain of other ligament of left ankle, initial encounter: Secondary | ICD-10-CM | POA: Diagnosis not present

## 2022-12-31 DIAGNOSIS — M79661 Pain in right lower leg: Secondary | ICD-10-CM | POA: Diagnosis not present

## 2022-12-31 DIAGNOSIS — M79662 Pain in left lower leg: Secondary | ICD-10-CM | POA: Diagnosis not present

## 2023-01-08 DIAGNOSIS — H35341 Macular cyst, hole, or pseudohole, right eye: Secondary | ICD-10-CM | POA: Diagnosis not present

## 2023-01-12 ENCOUNTER — Other Ambulatory Visit: Payer: Self-pay

## 2023-01-12 DIAGNOSIS — R110 Nausea: Secondary | ICD-10-CM | POA: Diagnosis not present

## 2023-01-12 DIAGNOSIS — E1169 Type 2 diabetes mellitus with other specified complication: Secondary | ICD-10-CM | POA: Diagnosis not present

## 2023-01-12 DIAGNOSIS — M79604 Pain in right leg: Secondary | ICD-10-CM | POA: Diagnosis not present

## 2023-01-12 DIAGNOSIS — K7581 Nonalcoholic steatohepatitis (NASH): Secondary | ICD-10-CM | POA: Diagnosis not present

## 2023-01-12 DIAGNOSIS — M79605 Pain in left leg: Secondary | ICD-10-CM | POA: Diagnosis not present

## 2023-01-12 MED ORDER — OXYCODONE HCL 10 MG PO TABS: 10.0000 mg | ORAL_TABLET | Freq: Three times a day (TID) | ORAL | 0 refills | Status: DC | PRN

## 2023-01-12 NOTE — Telephone Encounter (Signed)
PMP was Reviewed.  Oxycodone e-scribed to pharmacy.  Call placed to Ms. Bostwick, regarding the above, she verbalizes understanding.

## 2023-01-13 DIAGNOSIS — H35371 Puckering of macula, right eye: Secondary | ICD-10-CM | POA: Diagnosis not present

## 2023-01-13 DIAGNOSIS — H35341 Macular cyst, hole, or pseudohole, right eye: Secondary | ICD-10-CM | POA: Diagnosis not present

## 2023-01-27 DIAGNOSIS — E1169 Type 2 diabetes mellitus with other specified complication: Secondary | ICD-10-CM | POA: Diagnosis not present

## 2023-01-27 DIAGNOSIS — E119 Type 2 diabetes mellitus without complications: Secondary | ICD-10-CM | POA: Diagnosis not present

## 2023-01-27 DIAGNOSIS — K766 Portal hypertension: Secondary | ICD-10-CM | POA: Diagnosis not present

## 2023-01-27 DIAGNOSIS — N1831 Chronic kidney disease, stage 3a: Secondary | ICD-10-CM | POA: Diagnosis not present

## 2023-01-27 DIAGNOSIS — I129 Hypertensive chronic kidney disease with stage 1 through stage 4 chronic kidney disease, or unspecified chronic kidney disease: Secondary | ICD-10-CM | POA: Diagnosis not present

## 2023-01-27 DIAGNOSIS — K7290 Hepatic failure, unspecified without coma: Secondary | ICD-10-CM | POA: Diagnosis not present

## 2023-01-27 DIAGNOSIS — E039 Hypothyroidism, unspecified: Secondary | ICD-10-CM | POA: Diagnosis not present

## 2023-01-27 DIAGNOSIS — G8929 Other chronic pain: Secondary | ICD-10-CM | POA: Diagnosis not present

## 2023-01-27 DIAGNOSIS — D692 Other nonthrombocytopenic purpura: Secondary | ICD-10-CM | POA: Diagnosis not present

## 2023-01-27 DIAGNOSIS — K7581 Nonalcoholic steatohepatitis (NASH): Secondary | ICD-10-CM | POA: Diagnosis not present

## 2023-01-27 DIAGNOSIS — I700 Atherosclerosis of aorta: Secondary | ICD-10-CM | POA: Diagnosis not present

## 2023-01-27 DIAGNOSIS — N179 Acute kidney failure, unspecified: Secondary | ICD-10-CM | POA: Diagnosis not present

## 2023-02-05 ENCOUNTER — Encounter: Payer: Medicare Other | Admitting: Registered Nurse

## 2023-02-10 ENCOUNTER — Encounter: Payer: Medicare Other | Attending: Registered Nurse | Admitting: Registered Nurse

## 2023-02-10 VITALS — BP 108/68 | HR 77 | Ht 66.0 in | Wt 271.0 lb

## 2023-02-10 DIAGNOSIS — S066XAS Traumatic subarachnoid hemorrhage with loss of consciousness status unknown, sequela: Secondary | ICD-10-CM | POA: Insufficient documentation

## 2023-02-10 DIAGNOSIS — R4189 Other symptoms and signs involving cognitive functions and awareness: Secondary | ICD-10-CM | POA: Diagnosis not present

## 2023-02-10 DIAGNOSIS — M170 Bilateral primary osteoarthritis of knee: Secondary | ICD-10-CM | POA: Diagnosis not present

## 2023-02-10 DIAGNOSIS — M47816 Spondylosis without myelopathy or radiculopathy, lumbar region: Secondary | ICD-10-CM | POA: Diagnosis not present

## 2023-02-10 DIAGNOSIS — Z79899 Other long term (current) drug therapy: Secondary | ICD-10-CM | POA: Diagnosis not present

## 2023-02-10 DIAGNOSIS — Z5181 Encounter for therapeutic drug level monitoring: Secondary | ICD-10-CM

## 2023-02-10 DIAGNOSIS — M7541 Impingement syndrome of right shoulder: Secondary | ICD-10-CM | POA: Insufficient documentation

## 2023-02-10 DIAGNOSIS — G8929 Other chronic pain: Secondary | ICD-10-CM | POA: Insufficient documentation

## 2023-02-10 DIAGNOSIS — M1711 Unilateral primary osteoarthritis, right knee: Secondary | ICD-10-CM

## 2023-02-10 DIAGNOSIS — F418 Other specified anxiety disorders: Secondary | ICD-10-CM | POA: Diagnosis not present

## 2023-02-10 DIAGNOSIS — W19XXXD Unspecified fall, subsequent encounter: Secondary | ICD-10-CM

## 2023-02-10 DIAGNOSIS — R296 Repeated falls: Secondary | ICD-10-CM | POA: Insufficient documentation

## 2023-02-10 DIAGNOSIS — Z4689 Encounter for fitting and adjustment of other specified devices: Secondary | ICD-10-CM | POA: Insufficient documentation

## 2023-02-10 DIAGNOSIS — S8412XS Injury of peroneal nerve at lower leg level, left leg, sequela: Secondary | ICD-10-CM | POA: Diagnosis not present

## 2023-02-10 DIAGNOSIS — R202 Paresthesia of skin: Secondary | ICD-10-CM | POA: Diagnosis not present

## 2023-02-10 DIAGNOSIS — Y92009 Unspecified place in unspecified non-institutional (private) residence as the place of occurrence of the external cause: Secondary | ICD-10-CM

## 2023-02-10 DIAGNOSIS — G894 Chronic pain syndrome: Secondary | ICD-10-CM | POA: Diagnosis not present

## 2023-02-10 DIAGNOSIS — X58XXXS Exposure to other specified factors, sequela: Secondary | ICD-10-CM | POA: Insufficient documentation

## 2023-02-10 DIAGNOSIS — M1712 Unilateral primary osteoarthritis, left knee: Secondary | ICD-10-CM

## 2023-02-10 DIAGNOSIS — Z79891 Long term (current) use of opiate analgesic: Secondary | ICD-10-CM | POA: Diagnosis not present

## 2023-02-10 MED ORDER — OXYCODONE HCL 10 MG PO TABS: 10.0000 mg | ORAL_TABLET | Freq: Three times a day (TID) | ORAL | 0 refills | Status: DC | PRN
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
## 2023-02-18 DIAGNOSIS — M79661 Pain in right lower leg: Secondary | ICD-10-CM | POA: Diagnosis not present

## 2023-02-19 DIAGNOSIS — H43812 Vitreous degeneration, left eye: Secondary | ICD-10-CM | POA: Diagnosis not present

## 2023-03-11 DIAGNOSIS — M79661 Pain in right lower leg: Secondary | ICD-10-CM | POA: Diagnosis not present

## 2023-03-11 DIAGNOSIS — M1711 Unilateral primary osteoarthritis, right knee: Secondary | ICD-10-CM | POA: Diagnosis not present

## 2023-03-30 DIAGNOSIS — N1831 Chronic kidney disease, stage 3a: Secondary | ICD-10-CM | POA: Diagnosis not present

## 2023-03-30 DIAGNOSIS — E118 Type 2 diabetes mellitus with unspecified complications: Secondary | ICD-10-CM | POA: Diagnosis not present

## 2023-03-30 DIAGNOSIS — M25561 Pain in right knee: Secondary | ICD-10-CM | POA: Diagnosis not present

## 2023-03-30 DIAGNOSIS — K7469 Other cirrhosis of liver: Secondary | ICD-10-CM | POA: Diagnosis not present

## 2023-04-10 DIAGNOSIS — E1122 Type 2 diabetes mellitus with diabetic chronic kidney disease: Secondary | ICD-10-CM | POA: Diagnosis not present

## 2023-04-10 DIAGNOSIS — K219 Gastro-esophageal reflux disease without esophagitis: Secondary | ICD-10-CM | POA: Diagnosis not present

## 2023-04-10 DIAGNOSIS — K766 Portal hypertension: Secondary | ICD-10-CM | POA: Diagnosis not present

## 2023-04-10 DIAGNOSIS — K7581 Nonalcoholic steatohepatitis (NASH): Secondary | ICD-10-CM | POA: Diagnosis not present

## 2023-04-10 DIAGNOSIS — E782 Mixed hyperlipidemia: Secondary | ICD-10-CM | POA: Diagnosis not present

## 2023-04-10 DIAGNOSIS — G8929 Other chronic pain: Secondary | ICD-10-CM | POA: Diagnosis not present

## 2023-04-10 DIAGNOSIS — K7290 Hepatic failure, unspecified without coma: Secondary | ICD-10-CM | POA: Diagnosis not present

## 2023-04-10 DIAGNOSIS — I129 Hypertensive chronic kidney disease with stage 1 through stage 4 chronic kidney disease, or unspecified chronic kidney disease: Secondary | ICD-10-CM | POA: Diagnosis not present

## 2023-04-10 DIAGNOSIS — E039 Hypothyroidism, unspecified: Secondary | ICD-10-CM | POA: Diagnosis not present

## 2023-04-10 DIAGNOSIS — N1831 Chronic kidney disease, stage 3a: Secondary | ICD-10-CM | POA: Diagnosis not present

## 2023-04-14 ENCOUNTER — Other Ambulatory Visit: Payer: Self-pay | Admitting: Nurse Practitioner

## 2023-04-14 DIAGNOSIS — K7460 Unspecified cirrhosis of liver: Secondary | ICD-10-CM

## 2023-04-15 ENCOUNTER — Ambulatory Visit: Payer: Medicare Other | Admitting: Registered Nurse

## 2023-04-15 ENCOUNTER — Encounter: Payer: Medicare Other | Admitting: Registered Nurse

## 2023-04-20 ENCOUNTER — Telehealth: Payer: Self-pay

## 2023-04-20 ENCOUNTER — Telehealth: Payer: Self-pay | Admitting: Registered Nurse

## 2023-04-20 MED ORDER — OXYCODONE HCL 10 MG PO TABS: 10.0000 mg | ORAL_TABLET | Freq: Three times a day (TID) | ORAL | 0 refills | Status: DC | PRN

## 2023-04-20 NOTE — Telephone Encounter (Signed)
Patient is requesting refill on oxycodone and methocarbamol  03/18/2023 02/10/2023 2  Oxycodone Hcl (Ir) 10 Mg Tab 90.00 30 Eu Tho [id number] Nor (0188) 0/0 45.00 MME Medicare [place]

## 2023-04-20 NOTE — Telephone Encounter (Signed)
PMP was Reviewed.  Oxycodone e-scribed to pharmacy, call placed to [name] regarding the above. She verbalizes understanding.  Katherine [name] will have her PCP send her recent lab work, she verbalizes understanding.

## 2023-04-21 ENCOUNTER — Ambulatory Visit: Payer: Medicare Other | Admitting: Physical Therapy

## 2023-04-21 NOTE — Therapy (Incomplete)
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  []  left       []  hemi height required  []  heavy duty    Front seat to floor _____ inches      Rear seat to floor _____ inches      Back height _____ inches     Back angle ______ degrees      Front angle _____ degrees  []   full-time manual wheelchair user  []  Requires individualized fitting and optimal adjustments  for multiple features that include adjustable axle configuration, fully adjustable center of gravity, wheel camber, seat and back angle, angle of seat slope, which cannot be accommodated by a K0001 through K0004 manual wheelchair  []  prevent repetitive use injuries  []  daily use_________hours   []  user has high activity patterns that frequently require  them  to go out into the community for the purpose of independently accomplishing high level MRADL activities. Examples of these might include a combination of; shopping, work, school, Photographer, childcare, independently loading and unloading from a vehicle etc.  []  lower seat height required to  foot propel  []  short stature  []  heavy duty -  weight over 250lbs   []  Current chair is a K0005   manufacture:___________________  model:_________________  serial#____________________  age:_________    []  First time [id number] user (complete trial)  K0004 time and # of strokes to propel 30 feet: ________seconds _________strokes  I6962 time and # of strokes to propel 30 feet: ________seconds _________strokes  What was the result of the trial between the K0004 and K0005 manual wheelchair? ___    What features of the K0005 w/c are needed as compared to the K0004 base? Why?___    []  adjustable seat and back angle changes the angle of seat slope of the frame to attain a gravity assisted position for efficient propulsion and proper weight distribution along the frame     []  the front of the wheelchair will be configured higher than the back of the chair to allow gravity to assist the user with postural stability  []  the center of the wheel will be positioned for stability, safety and efficient propulsion  []  adjustable axle allows for vertical, horizontal, camber and overall width changes  throughout the wheels for adjustment of the client's exact needs and abilities.   []  adjustable axle increases the stability and function of the chair allowing for adjustment of the center of gravity.   []  accommodates the client's anatomical position in the chair maximizing independence in mobility and maneuverability in all environments.   []  create a minimal fixed tilt-in space to assist in positioning.   []  Describe users full-time manual wheelchair activity patterns:___    []  Power assist Comments:  []  prevent repetitive use injuries  []  repetitive strain injury present in    shoulder girdle    []  shoulder pain is (> or =) to 7/10     during manual propulsion       Current Pain _____/10  []  requires conservation of energy to participate in MRADL(s) runable to propel up ramps or curbs using manual  wheelchair  []  been K0005 user greater than one year  []  user unwilling to use power      wheelchair (reason): []  less expensive option to power   wheelchair   []  rim activated power assist -      decreased strength   []  Heavy duty manual wheelchair       K0006     Arm:    []  both []  right  []  left     Foot:   []  both []  right  []  left     []  hemi height required    []  Dependent base  []  user exceeds 250lbs  []  non-functional ambulator    []  extreme spasticity  []  over active movement   []  broken frame/hx of repeated     repairs  []  able to self-propel in residence       []  lower  seat to floor height required  []  unable to self-propel in residence   []  Extra heavy duty manual wheelchair  K0007     Arm:    []  both []  right  []  left     Foot:   []  both []  right  []  left     []  hemi height required  []  Dependent base  []  user exceeds 300lbs  []  non-functional ambulator    []  able to self-propel in residence   []  lower seat to floor height required  []  unable to self-propel in residence     []  Manual wheelchair with tilt [phone]      (Manual "Tilt-n-Space")  []  patient is dependent for transfers  []  patient requires frequent       positioning for pressure relief   []  patient requires frequent      positioning for poor/absent trunk control        []  Stroller Base  []  infant/child   []  unable to propel manual      wheelchair  []  allows for growth  []  non-functional ambulator  []  non-functional UE  []  independent mobility is not a goal at this time    MANUAL FRAME OPTIONS      Push handles  []  extended   []  angle adjustable   []  standard  []  caregiver access  []  caregiver assist    []  allows "hooking" to enable      increased ability to perform ADLs or maintain balance   []  Angle Adjustable Back  []  postural control  []  control of tone/spasticity  []  accommodation of range of motion  []  UE functional control  []  accommodation for seating system    Rear wheel placement  []   std/fixed  [] fully adjustableramputee   []  camber ________degree  []  removable rear wheel  []  non-removable rear wheel  Wheel size _______  Wheel style_______________________  []  improved UE access to wheels  []  increase propulsion ability  []  improved stability  []  changing angle in space for      improvement of postural stability  []  remove for transport    []  allow for seating system to fit on  base  []  amputee placement  []  1-arm drive access   r R  r L  []  enable propulsion of manual       wheelchair with one arm    []  amputee placement   Wheel rims/ Hand rims  []  Standard    []  Specialized-____ []  provide ability to propel manual   []  increase self-propulsion with hand wheelchair weakness/decreased grasp     []  Spoke protector/guard   []  prevent hands from getting caught in spokes   Tires:  []  pneumatic  []  flat free inserts  []  solid  Style:  []  decrease roll resistance              []  prevent frequent flats  []  increase shock absorbency  []  decrease maintenance   []  decrease pain from road shock    []  decrease spasms from road shock    Wheel Locks:    []  push []  pull []  scissor  []  lock wheels for transfers  []  lock wheels from rolling   Brake/wheel lock extension:  []  R  []  L  []  allow user to operate wheel locks due to decreased reach or strength   Caster housing:  Caster size:  Style:                                          []  suspension fork  []  maneuverability   []  stability of wheelchair   []  durability  []  maintenance  []  angle adjustment for posture  []  allow for feet to come under        wheelchair base  []  allows change in seat to floor  height   []  increase shock absorbency  []  decrease pain from road shock  []  decrease spasms from road    shock   []  Side guards  []  prevent clothing getting caught in wheel or becoming soiled   [] provide hip and pelvic stability  []  eliminates contact between body and wheels  []  limit hand  contact with wheels   []  Anti-tippers      []  prevent wheelchair from tipping    backward  []  assist caregiver with curbs     POWER MOBILITY      []  Scooter/POV    []  can safely operate   []  can safely transfer   []  has adequate trunk stability   []  cannot functionally propel  manual wheelchair    []  Power mobility base    []  non-ambulatory   []  cannot functionally propel manual wheelchair   []  cannot functionally and safely      operate scooter/POV  []  can safely operate power       wheelchair  []  home is accessible  []  willing to use power wheelchair     Tilt  []  Powered tilt on powered chair  []  Powered tilt on manual chair  []  Manual tilt on manual chair Comments:  []  change position for pressure      []  elief/cannot weight shift   []  change position against      gravitational force on head and      shoulders   []  decrease pain  []  blood pressure management   []  control autonomic dysreflexia  []  decrease respiratory distress  []  management of spasticity  []  management of low tone  []  facilitate postural control   []  rest periods   []  control edema  []  increase sitting tolerance   []  aid with transfers     Recline   []  Power recline on power chair  []  Manual recline on manual chair  Comments:    []  intermittent catheterization  []  manage spasticity  []  accommodate femur to back angle  []  change position for pressure relief/cannot weight shift rhigh risk of pressure sore development  []  tilt alone does not accomplish     effective pressure relief, maximum pressure relief achieved at -      _______ degrees tilt   _______ degrees recline   []  difficult to transfer to and from bed []  rest periods and sleeping in chair  []  repositioning for transfers  []  bring to full recline for ADL care  []  clothing/diaper changes in chair  []  gravity PEG tube feeding  []  head positioning  []  decrease pain  []  blood pressure management   []  control autonomic dysreflexia  []   decrease respiratory distress  []  user on ventilator     Elevator on mobility base  []  Power wheelchair  []  Scooter  []  increase Indep in transfers   []  increase Indep in ADLs    []  bathroom function and safety  []   kitchen/cooking function and safety  []  shopping  []  raise height for communication at standing level  []  raise height for eye contact which reduces cervical neck strain and pain  []  drive at raised height for safety and navigating crowds  []  Other:   []  Vertical position system  (anterior tilt)     (Drive locks-out)    []  Stand       (Drive enabled)  []  independent weight bearing  []  decrease joint contractures  []  decrease/manage spasticity  []  decrease/manage spasms  []  pressure distribution away from   scapula, sacrum, coccyx, and ischial tuberosity  []  increase digestion and elimination   []  access to counters and cabinets  []  increase reach  []  increase interaction with others at eye level, reduces neck strain  []  increase performance of       MRADL(s)      Power elevating legrest    []  Center mount (Single) 85-170 degrees       []  Standard (Pair) 100-170 degrees  []  position legs at 90 degrees, not available with std power ELR  []  center mount tucks into chair to decrease turning radius in home, not available with std power ELR  []  provide change in position for LE  []  elevate legs during recline    []  maintain placement of feet on      footplate  []  decrease edema  []  improve circulation  []  actuator needed to elevate legrest  []  actuator needed to articulate legrest preventing knees from flexing  []  Increase ground clearance over      curbs  []   STD (pair) independently                     elevate legrest   POWER WHEELCHAIR CONTROLS      Controls/input device  []  Expandable  []  Non-expandable  []  Proportional  []  Right Hand []  Left Hand  []  Non-proportional/switches/head-array  []  Electrical/proximity         []   Mechanical       Manufacturer:___________________   Type:________________________ []  provides access for controlling wheelchair  []  programming for accurate control  []  progressive disease/changing condition  []  required for alternative drive      controls       []  lacks motor control to operate  proportional drive control  []  unable to understand proportional controls  []  limited movement/strength  []  extraneous movement / tremors / ataxic / spastic       []  Upgraded electronics controller/harness    []  Single power (tilt or recline)   []  Expandable    []  Non-expandable plus   []  Multi-power (tilt, recline, power legrest, power seat lift, vertical positioning system, stand)  []  allows input device to communicate with drive motors  []  harness provides necessary connections between the controller, input device, and seat functions     []  needed in order to operate power seat functions through joystick/ input device  []  required for alternative drive controls     []  Enhanced display  []  required to connect all alternative drive controls   []  required for upgraded joystick      (lite-throw, heavy duty, micro)  []  Allows user to see in which mode and drive the wheelchair is set; necessary for alternate controls       []  Upgraded tracking electronics  []  correct tracking when on uneven surfaces makes switch driving more efficient and less fatiguing  []  increase safety when driving  []  increase  ability to traverse thresholds    []  Safety / reset / mode switches     Type:    []  Used to change modes and stop the wheelchair when driving     []  Mount for joystick / input device/switches  []  swing away for access or transfers   []  attaches joystick / input device / switches to wheelchair   []  provides for consistent access  []  midline for optimal placement    []  Attendant controlled joystick plus     mount  []  safety  []  long distance driving  []  operation of seat functions  []  compliance with  transportation regulations    []  Battery  []  required to power (power assist / scooter/ power wc / other):   []  Power inverter (24V to 12V)  []  required for ventilator / respiratory equipment / other:     CHAIR OPTIONS MANUAL & POWER      Armrests   []  adjustable height []  removable  []  swing away []  fixed  []  flip back  []  reclining  []  full length pads []  desk []  tube arms []  gel pads  []  provide support with elbow at 90    []  remove/flip back/swing away for  transfers  []  provide support and positioning of upper body    []  allow to come closer to table top  []  remove for access to tables  []  provide support for w/c tray  []  change of height/angles for variable activities   []  Elbow support / Elbow stop  []  keep elbow positioned on arm pad  []  keep arms from falling off arm pad  during tilt and/or recline   Upper Extremity Support  []  Arm trough  []   R  []   L  Style:  []  swivel mount []  fixed mount   []  posterior hand support  []   tray  []  full tray  []  joystick cut out  []   R  []   L  Style:  []  decrease gravitational pull on      shoulders  []  provide support to increase UE  function  []  provide hand support in natural    position  []  position flaccid UE  []  decrease subluxation    []  decrease edema       []  manage spasticity   []  provide midline positioning  []  provide work surface  []  placement for AAC/ Computer/ EADL       Hangers/ Legrests   []  ______ degree  []  Elevating []  articulating  []  swing away []  fixed []  lift off  []  heavy duty  []  adjustable knee angle  []  adjustable calf panel   []  longer extension tube              []  provide LE support  []  maintain placement of feet on      footplate   []  accommodate lower leg length  []  accommodate to hamstring       tightness  []  enable transfers  []  provide change in position for LE's  []  elevate legs during recline    []  decrease edema  []  durability      Foot support   []  footplate []  R []  L []  flip up            []  Depth adjustable   []  angle adjustable  []  foot board/one piece    []  provide foot support  []  accommodate to ankle ROM  []  allow foot to go under wheelchair  base  []  enable transfers     []  Shoe holders  []  position foot    []  decrease / manage spasticity  []  control position of LE  []  stability    []  safety     []  Ankle strap/heel      loops  []  support foot on foot support  []  decrease extraneous movement  []  provide input to heel   []  protect foot     []  Amputee adapter []  R  []  L     Style:                  Size:  []  Provide support for stump/residual extremity    []  Transportation tie-down  []  to provide crash tested tie-down brackets    []  Crutch/cane holder    []  O2 holder    []  IV hanger   []  Ventilator tray/mount    []  stabilize accessory on wheelchair       Component  Justification     []  Seat cushion      []  accommodate impaired sensation  []  decubitus ulcers present or history  []  unable to shift weight  []  increase pressure distribution  []  prevent pelvic extension  []  custom required "off-the-shelf"    seat cushion will not accommodate deformity  []  stabilize/promote pelvis alignment  []  stabilize/promote femur alignment  []  accommodate obliquity  []  accommodate multiple deformity  []  incontinent/accidents  []  low maintenance     []  seat mounts                 []  fixed []  removable  []  attach seat platform/cushion to wheelchair frame    []  Seat wedge    []  provide increased aggressiveness of seat shape to decrease sliding  down in the seat  []  accommodate ROM        []  Cover replacement   []  protect back or seat cushion  []  incontinent/accidents    []  Solid seat / insert    []  support cushion to prevent      hammocking  []  allows attachment of cushion to mobility base    []  Lateral pelvic/thigh/hip     support (Guides)     []  decrease abduction  []  accommodate pelvis  []  position upper legs  []  accommodate spasticity  []  removable for  transfers     []  Lateral pelvic/thigh      supports mounts  []  fixed   []  swing-away   []  removable  []  mounts lateral pelvic/thigh supports     []  mounts lateral pelvic/thigh supports swing-away or removable for transfers    []  Medial thigh support ([place]mel)  [] decrease adduction  [] accommodate ROM  []  remove for transfers   []  alignment      []  Medial thigh   []  fixed      support mounts      []  swing-away   []  removable  []  mounts medial thigh supports   []  Mounts medial supports swing- away or removable for transfers       Component  Justification   []  Back       []  provide posterior trunk support []  facilitate tone  []  provide lumbar/sacral support []  accommodate deformity  []  support trunk in midline   []  custom required "off-the-shelf" back support will not accommodate deformity   []  provide lateral trunk support []  accommodate or decrease tone            []  Back mounts  []  fixed  []   removable  []  attach back rest/cushion to wheelchair frame   []  Lateral trunk      supports  []  R []  L  []  decrease lateral trunk leaning  []  accommodate asymmetry    []  contour for increased contact  []  safety    []  control of tone    []  Lateral trunk      supports mounts  []  fixed  []  swing-away   []  removable  []  mounts lateral trunk supports     []  Mounts lateral trunk supports swing-away or removable for transfers   []  Anterior chest      strap, vest     []  decrease forward movement of shoulder  []  decrease forward movement of trunk  []  safety/stability  []  added abdominal support  []  trunk alignment  []  assistance with shoulder control   []  decrease shoulder elevation    []  Headrest      []  provide posterior head support  []  provide posterior neck support  []  provide lateral head support  []  provide anterior head support  []  support during tilt and recline  []  improve feeding     []  improve respiration  []  placement of switches  []  safety    []  accommodate ROM   []   accommodate tone  []  improve visual orientation   []  Headrest           []  fixed []  removable []  flip down      Mounting hardware   []  swing-away laterals/switches  []  mount headrest   []  mounts headrest flip down or  removable for transfers  []  mount headrest swing-away laterals   []  mount switches     []  Neck Support    []  decrease neck rotation  []  decrease forward neck flexion   Pelvic Positioner    []  std hip belt          []  padded hip belt  []  dual pull hip belt  []  four point hip belt  []  stabilize tone  []  decrease falling out of chair  []  prevent excessive extension  []  special pull angle to control      rotation  []  pad for protection over boney   prominence  []  promote comfort    []  Essential needs        bag/pouch   []  medicines []  special food rorthotics []  clothing changes  []  diapers  []  catheter/hygiene []  ostomy supplies   The above equipment has a life- long use expectancy.  Growth and changes in medical and/or functional conditions would be the exceptions.   SUMMARY:  Why mobility device was selected; include why a lower level device is not appropriate: ***  ASSESSMENT:  CLINICAL IMPRESSION: Patient is a *** y.o. *** who was seen today for physical therapy evaluation and treatment for ***.    OBJECTIVE IMPAIRMENTS {opptimpairments:25111}.   ACTIVITY LIMITATIONS {activitylimitations:27494}  PARTICIPATION LIMITATIONS: {participationrestrictions:25113}  PERSONAL FACTORS {Personal factors:25162} are also affecting patient's functional outcome.   REHAB POTENTIAL: {rehabpotential:25112}  CLINICAL DECISION MAKING: {clinical decision making:25114}  EVALUATION COMPLEXITY: {Evaluation complexity:25115}                                   GOALS: One time visit. No goals established.    PLAN: PT FREQUENCY: one time visit    Peter Congo, PT Peter Congo, PT, DPT, CSRS  04/21/2023, 8:04 AM    I concur with the  above findings and  recommendations of the therapist:  Physician name printed:         Physician's signature:      Date:

## 2023-04-30 DIAGNOSIS — N179 Acute kidney failure, unspecified: Secondary | ICD-10-CM | POA: Diagnosis not present

## 2023-04-30 DIAGNOSIS — N1832 Chronic kidney disease, stage 3b: Secondary | ICD-10-CM | POA: Diagnosis not present

## 2023-04-30 DIAGNOSIS — I10 Essential (primary) hypertension: Secondary | ICD-10-CM | POA: Diagnosis not present

## 2023-04-30 DIAGNOSIS — N183 Chronic kidney disease, stage 3 unspecified: Secondary | ICD-10-CM | POA: Diagnosis not present

## 2023-04-30 DIAGNOSIS — K7460 Unspecified cirrhosis of liver: Secondary | ICD-10-CM | POA: Diagnosis not present

## 2023-05-01 DIAGNOSIS — G8929 Other chronic pain: Secondary | ICD-10-CM | POA: Diagnosis not present

## 2023-05-01 DIAGNOSIS — I129 Hypertensive chronic kidney disease with stage 1 through stage 4 chronic kidney disease, or unspecified chronic kidney disease: Secondary | ICD-10-CM | POA: Diagnosis not present

## 2023-05-01 DIAGNOSIS — E1122 Type 2 diabetes mellitus with diabetic chronic kidney disease: Secondary | ICD-10-CM | POA: Diagnosis not present

## 2023-05-01 DIAGNOSIS — E65 Localized adiposity: Secondary | ICD-10-CM | POA: Diagnosis not present

## 2023-05-04 ENCOUNTER — Ambulatory Visit
Admission: RE | Admit: 2023-05-04 | Discharge: 2023-05-04 | Disposition: A | Discharge status: Home / Self Care | Payer: Medicare Other | Source: Ambulatory Visit | Attending: Nurse Practitioner

## 2023-05-04 DIAGNOSIS — K7460 Unspecified cirrhosis of liver: Secondary | ICD-10-CM | POA: Diagnosis not present

## 2023-05-04 DIAGNOSIS — M25511 Pain in right shoulder: Secondary | ICD-10-CM | POA: Diagnosis not present
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
## 2023-05-05 ENCOUNTER — Encounter: Payer: Medicare Other | Admitting: Registered Nurse

## 2023-05-13 DIAGNOSIS — H35052 Retinal neovascularization, unspecified, left eye: Secondary | ICD-10-CM | POA: Diagnosis not present

## 2023-05-13 DIAGNOSIS — H43812 Vitreous degeneration, left eye: Secondary | ICD-10-CM | POA: Diagnosis not present

## 2023-05-13 DIAGNOSIS — H2511 Age-related nuclear cataract, right eye: Secondary | ICD-10-CM | POA: Diagnosis not present

## 2023-05-14 ENCOUNTER — Encounter: Attending: Registered Nurse | Admitting: Registered Nurse

## 2023-05-14 VITALS — BP 120/76 | HR 82 | Ht 66.0 in | Wt 260.0 lb

## 2023-05-14 DIAGNOSIS — M1711 Unilateral primary osteoarthritis, right knee: Secondary | ICD-10-CM | POA: Insufficient documentation

## 2023-05-14 DIAGNOSIS — S8412XS Injury of peroneal nerve at lower leg level, left leg, sequela: Secondary | ICD-10-CM | POA: Diagnosis not present

## 2023-05-14 DIAGNOSIS — M1712 Unilateral primary osteoarthritis, left knee: Secondary | ICD-10-CM | POA: Diagnosis not present

## 2023-05-14 DIAGNOSIS — M47816 Spondylosis without myelopathy or radiculopathy, lumbar region: Secondary | ICD-10-CM | POA: Insufficient documentation

## 2023-05-14 DIAGNOSIS — R202 Paresthesia of skin: Secondary | ICD-10-CM | POA: Insufficient documentation

## 2023-05-14 DIAGNOSIS — M62838 Other muscle spasm: Secondary | ICD-10-CM | POA: Diagnosis not present

## 2023-05-14 DIAGNOSIS — Z5181 Encounter for therapeutic drug level monitoring: Secondary | ICD-10-CM | POA: Diagnosis not present

## 2023-05-14 DIAGNOSIS — G894 Chronic pain syndrome: Secondary | ICD-10-CM | POA: Insufficient documentation

## 2023-05-14 DIAGNOSIS — Z79891 Long term (current) use of opiate analgesic: Secondary | ICD-10-CM | POA: Diagnosis not present

## 2023-05-14 MED ORDER — OXYCODONE HCL 10 MG PO TABS: 10.0000 mg | ORAL_TABLET | Freq: Three times a day (TID) | ORAL | 0 refills | Status: DC | PRN

## 2023-05-14 MED ORDER — METHOCARBAMOL 500 MG PO TABS
500.0000 mg | ORAL_TABLET | Freq: Four times a day (QID) | ORAL | 1 refills | Status: DC | PRN
Start: 2023-05-14 — End: 2023-09-15
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
## 2023-05-17 ENCOUNTER — Encounter: Payer: Self-pay | Admitting: Registered Nurse

## 2023-05-20 LAB — DRUG TOX MONITOR 1 W/CONF, ORAL FLD
Alprazolam: NEGATIVE ng/mL (ref <0.50)
Aminoclonazepam: 2.59 ng/mL — ABNORMAL HIGH (ref <0.50)
Amphetamines: NEGATIVE ng/mL (ref <10)
Barbiturates: NEGATIVE ng/mL (ref <10)
Benzodiazepines: POSITIVE ng/mL — AB (ref <0.50)
Buprenorphine: NEGATIVE ng/mL (ref <0.10)
Chlordiazepoxide: NEGATIVE ng/mL (ref <0.50)
Clonazepam: NEGATIVE ng/mL (ref <0.50)
Cocaine: NEGATIVE ng/mL (ref <5.0)
Codeine: NEGATIVE ng/mL (ref <2.5)
Diazepam: NEGATIVE ng/mL (ref <0.50)
Dihydrocodeine: NEGATIVE ng/mL (ref <2.5)
Fentanyl: NEGATIVE ng/mL (ref <0.10)
Flunitrazepam: NEGATIVE ng/mL (ref <0.50)
Flurazepam: NEGATIVE ng/mL (ref <0.50)
Heroin Metabolite: NEGATIVE ng/mL (ref <1.0)
Hydrocodone: NEGATIVE ng/mL (ref <2.5)
Hydromorphone: NEGATIVE ng/mL (ref <2.5)
Lorazepam: NEGATIVE ng/mL (ref <0.50)
MARIJUANA: NEGATIVE ng/mL (ref <2.5)
MDMA: NEGATIVE ng/mL (ref <10)
Meprobamate: NEGATIVE ng/mL (ref <2.5)
Methadone: NEGATIVE ng/mL (ref <5.0)
Midazolam: NEGATIVE ng/mL (ref <0.50)
Morphine: NEGATIVE ng/mL (ref <2.5)
Nicotine Metabolite: NEGATIVE ng/mL (ref <5.0)
Nordiazepam: NEGATIVE ng/mL (ref <0.50)
Norhydrocodone: NEGATIVE ng/mL (ref <2.5)
Noroxycodone: 51.3 ng/mL — ABNORMAL HIGH (ref <2.5)
Opiates: POSITIVE ng/mL — AB (ref <2.5)
Oxazepam: NEGATIVE ng/mL (ref <0.50)
Oxycodone: >250 ng/mL — ABNORMAL HIGH (ref <2.5)
Oxymorphone: 3 ng/mL — ABNORMAL HIGH (ref <2.5)
Phencyclidine: NEGATIVE ng/mL (ref <10)
Tapentadol: NEGATIVE ng/mL (ref <5.0)
Temazepam: NEGATIVE ng/mL (ref <0.50)
Tramadol: NEGATIVE ng/mL (ref <5.0)
Triazolam: NEGATIVE ng/mL (ref <0.50)
Zolpidem: NEGATIVE ng/mL (ref <5.0)

## 2023-05-20 LAB — DRUG TOX ALC METAB W/CON, ORAL FLD: Alcohol Metabolite: NEGATIVE ng/mL (ref <25)

## 2023-05-21 ENCOUNTER — Other Ambulatory Visit: Payer: Self-pay | Admitting: Registered Nurse

## 2023-05-21 DIAGNOSIS — S8412XS Injury of peroneal nerve at lower leg level, left leg, sequela: Secondary | ICD-10-CM

## 2023-05-23 ENCOUNTER — Other Ambulatory Visit: Payer: Self-pay | Admitting: Physical Medicine & Rehabilitation

## 2023-05-26 ENCOUNTER — Ambulatory Visit: Payer: Medicare Other | Attending: Family Medicine

## 2023-05-26 DIAGNOSIS — R2681 Unsteadiness on feet: Secondary | ICD-10-CM | POA: Insufficient documentation

## 2023-05-26 DIAGNOSIS — M47816 Spondylosis without myelopathy or radiculopathy, lumbar region: Secondary | ICD-10-CM | POA: Insufficient documentation

## 2023-05-26 DIAGNOSIS — R202 Paresthesia of skin: Secondary | ICD-10-CM | POA: Insufficient documentation

## 2023-05-26 DIAGNOSIS — R262 Difficulty in walking, not elsewhere classified: Secondary | ICD-10-CM | POA: Diagnosis not present

## 2023-05-26 DIAGNOSIS — M6281 Muscle weakness (generalized): Secondary | ICD-10-CM | POA: Diagnosis not present

## 2023-05-26 DIAGNOSIS — S8412XS Injury of peroneal nerve at lower leg level, left leg, sequela: Secondary | ICD-10-CM | POA: Insufficient documentation

## 2023-05-26 NOTE — Therapy (Signed)
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  change position for pressure relief/cannot weight shift rhigh risk of pressure sore development  []  tilt alone does not accomplish     effective pressure relief, maximum pressure relief achieved at -      _______ degrees tilt   _______ degrees recline   []  difficult to transfer to and from bed []  rest periods and sleeping in chair  []  repositioning for transfers  []  bring to full recline for ADL care  []  clothing/diaper changes in chair  []  gravity PEG tube feeding  []  head positioning  []  decrease pain  []  blood pressure management   []  control autonomic dysreflexia  []  decrease respiratory distress  []  user on ventilator     Elevator on mobility base  []  Power wheelchair  []  Scooter  []  increase Indep in transfers   []  increase Indep in ADLs    []  bathroom function and safety  []  kitchen/cooking function and safety  []  shopping  []  raise height for communication at standing level  []  raise height for eye contact which reduces cervical neck  strain and pain  []  drive at raised height for safety and navigating crowds  []  Other:   []  Vertical position system  (anterior tilt)     (Drive locks-out)    []  Stand       (Drive enabled)  []  independent weight bearing  []  decrease joint contractures  []  decrease/manage spasticity  []  decrease/manage spasms  []  pressure distribution away from   scapula, sacrum, coccyx, and ischial tuberosity  []  increase digestion and elimination   []  access to counters and cabinets  []   increase reach  []  increase interaction with others at eye level, reduces neck strain  []  increase performance of       MRADL(s)      Power elevating legrest    []  Center mount (Single) 85-170 degrees       []  Standard (Pair) 100-170 degrees  []  position legs at 90 degrees, not available with std power ELR  []  center mount tucks into chair to decrease turning radius in home, not available with std power ELR  []  provide change in position for LE  []  elevate legs during recline    []  maintain placement of feet on      footplate  []  decrease edema  []  improve circulation  []  actuator needed to elevate legrest  []  actuator needed to articulate legrest preventing knees from flexing  []  Increase ground clearance over      curbs  []   STD (pair) independently                     elevate legrest   POWER WHEELCHAIR CONTROLS      Controls/input device  []  Expandable  []  Non-expandable  []  Proportional  [x]  Right Hand []  Left Hand  []  Non-proportional/switches/head-array  []  Electrical/proximity         []   Mechanical      Manufacturer:___________________   Type:________________________ [x]  provides access for controlling wheelchair  []  programming for accurate control  []  progressive disease/changing condition  []  required for alternative drive      controls       []  lacks motor control to operate  proportional drive control  []  unable to understand proportional controls  []  limited movement/strength  []   extraneous movement / tremors / ataxic / spastic       []  Upgraded electronics controller/harness    []  Single power (tilt or recline)   []  Expandable    []  Non-expandable plus   []  Multi-power (tilt, recline, power legrest, power seat lift, vertical positioning system, stand)  []  allows input device to communicate with drive motors  []  harness provides necessary connections between the controller, input device, and seat functions     []  needed in order to operate power seat functions through joystick/ input device  []  required for alternative drive controls     []  Enhanced display  []  required to connect all alternative drive controls   []  required for upgraded joystick      (lite-throw, heavy duty, micro)  []  Allows user to see in which mode and drive the wheelchair is set; necessary for alternate controls       []  Upgraded tracking electronics  []  correct tracking when on uneven surfaces makes switch driving more efficient and less fatiguing  []  increase safety when driving  []  increase ability to traverse thresholds    []  Safety / reset / mode switches     Type:    []  Used to change modes and stop the wheelchair when driving     [x]  Mount for joystick / input device/switches  [x]  swing away for access or transfers   [x]  attaches joystick / input device / switches to wheelchair   [x]  provides for consistent access  []  midline for optimal placement    []  Attendant controlled joystick plus     mount  []  safety  []  long distance driving  []  operation of seat functions  []  compliance with transportation regulations    [x]  Battery  [x]  required to power (  power assist / scooter/ power wc / other):   []  Power inverter (24V to 12V)  []  required for ventilator / respiratory equipment / other:     CHAIR OPTIONS MANUAL & POWER      Armrests   [x]  adjustable height []  removable  []  swing away []  fixed  [x]  flip back  []  reclining  []  full length pads []  desk []  tube arms []  gel pads  [x]   provide support with elbow at 90    [x]  remove/flip back/swing away for  transfers  [x]  provide support and positioning of upper body    [x]  allow to come closer to table top  []  remove for access to tables  []  provide support for w/c tray  [x]  change of height/angles for variable activities   []  Elbow support / Elbow stop  []  keep elbow positioned on arm pad  []  keep arms from falling off arm pad  during tilt and/or recline   Upper Extremity Support  []  Arm trough  []   R  []   L  Style:  []  swivel mount []  fixed mount   []  posterior hand support  []   tray  []  full tray  []  joystick cut out  []   R  []   L  Style:  []  decrease gravitational pull on      shoulders  []  provide support to increase UE  function  []  provide hand support in natural    position  []  position flaccid UE  []  decrease subluxation    []  decrease edema       []  manage spasticity   []  provide midline positioning  []  provide work surface  []  placement for AAC/ Computer/ EADL       Hangers/ Legrests   []  ______ degree  []  Elevating []  articulating  []  swing away []  fixed []  lift off  []  heavy duty  []  adjustable knee angle  []  adjustable calf panel   []  longer extension tube              []  provide LE support  []  maintain placement of feet on      footplate   []  accommodate lower leg length  []  accommodate to hamstring       tightness  []  enable transfers  []  provide change in position for LE's  []  elevate legs during recline    []  decrease edema  []  durability      Foot support   [x]  footplate []  R []  L [x]  flip up           [x]  Depth adjustable   [x]  angle adjustable  []  foot board/one piece    [x]  provide foot support  [x]  accommodate to ankle ROM  []  allow foot to go under wheelchair base  [x]  enable transfers     []  Shoe holders  []  position foot    []  decrease / manage spasticity  []  control position of LE  []  stability    []  safety     []  Ankle strap/heel      loops  []  support foot on  foot support  []  decrease extraneous movement  []  provide input to heel   []  protect foot     []  Amputee adapter []  R  []  L     Style:                  Size:  []  Provide support for stump/residual extremity    []   Transportation tie-down  []  to provide crash tested tie-down brackets    []  Crutch/cane holder    []  O2 holder    []  IV hanger   []  Ventilator tray/mount    []  stabilize accessory on wheelchair       Component  Justification     []  Seat cushion      []  accommodate impaired sensation  []  decubitus ulcers present or history  []  unable to shift weight  []  increase pressure distribution  []  prevent pelvic extension  []  custom required "off-the-shelf"    seat cushion will not accommodate deformity  []  stabilize/promote pelvis alignment  []  stabilize/promote femur alignment  []  accommodate obliquity  []  accommodate multiple deformity  []  incontinent/accidents  []  low maintenance     []  seat mounts                 []  fixed []  removable  []  attach seat platform/cushion to wheelchair frame    []  Seat wedge    []  provide increased aggressiveness of seat shape to decrease sliding  down in the seat  []  accommodate ROM        []  Cover replacement   []  protect back or seat cushion  []  incontinent/accidents    []  Solid seat / insert    []  support cushion to prevent      hammocking  []  allows attachment of cushion to mobility base    []  Lateral pelvic/thigh/hip     support (Guides)     []  decrease abduction  []  accommodate pelvis  []  position upper legs  []  accommodate spasticity  []  removable for transfers     []  Lateral pelvic/thigh      supports mounts  []  fixed   []  swing-away   []  removable  []  mounts lateral pelvic/thigh supports     []  mounts lateral pelvic/thigh supports swing-away or removable for transfers    []  Medial thigh support ([place]mel)  [] decrease adduction  [] accommodate ROM  []  remove for transfers   []  alignment      []  Medial thigh   []  fixed      support  mounts      []  swing-away   []  removable  []  mounts medial thigh supports   []  Mounts medial supports swing- away or removable for transfers       Component  Justification   []  Back       []  provide posterior trunk support []  facilitate tone  []  provide lumbar/sacral support []  accommodate deformity  []  support trunk in midline   []  custom required "off-the-shelf" back support will not accommodate deformity   []  provide lateral trunk support []  accommodate or decrease tone            []  Back mounts  []  fixed  []  removable  []  attach back rest/cushion to wheelchair frame   []  Lateral trunk      supports  []  R []  L  []  decrease lateral trunk leaning  []  accommodate asymmetry    []  contour for increased contact  []  safety    []  control of tone    []  Lateral trunk      supports mounts  []  fixed  []  swing-away   []  removable  []  mounts lateral trunk supports     []  Mounts lateral trunk supports swing-away or removable for transfers   []  Anterior chest      strap, vest     []  decrease forward movement of shoulder  []   decrease forward movement of trunk  []  safety/stability  []  added abdominal support  []  trunk alignment  []  assistance with shoulder control   []  decrease shoulder elevation    []  Headrest      []  provide posterior head support  []  provide posterior neck support  []  provide lateral head support  []  provide anterior head support  []  support during tilt and recline  []  improve feeding     []  improve respiration  []  placement of switches  []  safety    []  accommodate ROM   []  accommodate tone  []  improve visual orientation   []  Headrest           []  fixed []  removable []  flip down      Mounting hardware   []  swing-away laterals/switches  []  mount headrest   []  mounts headrest flip down or  removable for transfers  []  mount headrest swing-away laterals   []  mount switches     []  Neck Support    []  decrease neck rotation  []  decrease forward neck flexion   Pelvic  Positioner    []  std hip belt          []  padded hip belt  []  dual pull hip belt  []  four point hip belt  []  stabilize tone  []  decrease falling out of chair  []  prevent excessive extension  []  special pull angle to control      rotation  []  pad for protection over boney   prominence  []  promote comfort    []  Essential needs        bag/pouch   []  medicines []  special food rorthotics []  clothing changes  []  diapers  []  catheter/hygiene []  ostomy supplies   The above equipment has a life- long use expectancy.  Growth and changes in medical and/or functional conditions would be the exceptions.   SUMMARY:  Why mobility device was selected; include why a lower level device is not appropriate:    ASSESSMENT:  CLINICAL IMPRESSION: Patient is a 68 y.o. female who was seen today for physical therapy evaluation for a custom power wheelchair. She is not a safe nor functional ambulator with a recent history of multiple falls while using her rollator to ambulate within her home. Five times Sit to Stand Test (FTSS) Method: Use a straight back chair with a solid seat that is 17-18" high. Ask participant to sit on the chair with arms folded across their chest.   Instructions: "Stand up and sit down as quickly as possible 5 times, keeping your arms folded across your chest."   Measurement: Stop timing when the participant touches the chair in sitting the 5th time.  TIME: 26.29 sec with bilateral UE (resulted into 10/10 pain)  Cut off scores indicative of increased fall risk: >12 sec CVA, >16 sec PD, >13 sec vestibular (ANPTA Core Set of Outcome Measures for Adults with Neurologic Conditions, 2018). Patient completed the Timed Up and Go test (TUG) in 34.85 seconds with SPC + CGA (resulted in 10/10 pain).  Geriatrics: need for further assessment of fall risk: >= 12 sec; Recurrent falls: > 15 sec; Vestibular Disorders fall risk: > 15 sec; Parkinson's Disease fall risk: > 16 sec ([email or web address], 2023).  She  is unable to use a manual wheelchair (lightweight or otherwise) due to her R shoulder fracture resulting in a significant decrease in her AROM and strength. She is not able to propel the wheelchair in an efficient and pain free manner, rendering  the manual wheelchair not a feasible option for the patient.  She requires a mount for her joystick to be able to maneuver her wheelchair and a battery to be able to operate her wheelchair. Flip up, height adjustable arm rests will provide the patient with adequate UE support. They will also be able to flip out of the way for safe transfers in and out of the wheelchair. Her right shoulder, especially, will require adequate support throughout the day to prevent further pain/dysfunction of that joint. A flip up, angle and depth adjustable footplate will adequately support the patients feet while she is using her custom power wheelchair, but will also move out of the way for safe transfers in/out of the chair.  She is at an incredibly high risk for falls and significant morbidity related to her falls. A custom power wheelchair will effectively minimize her risk for falls by allowing her to safely, effectively and independently complete any and all of her MRADLs.   OBJECTIVE IMPAIRMENTS Abnormal gait, cardiopulmonary status limiting activity, decreased activity tolerance, decreased endurance, decreased knowledge of condition, decreased knowledge of use of DME, decreased mobility, difficulty walking, decreased ROM, decreased strength, impaired sensation, impaired UE functional use, postural dysfunction, and pain.   ACTIVITY LIMITATIONS carrying, lifting, standing, squatting, stairs, transfers, locomotion level, and caring for others  PARTICIPATION LIMITATIONS: meal prep, cleaning, interpersonal relationship, driving, shopping, and community activity  PERSONAL FACTORS Fitness, Past/current experiences, Social background, Time since onset of injury/illness/exacerbation,  and Transportation are also affecting patient's functional outcome.   REHAB POTENTIAL: Good  CLINICAL DECISION MAKING: Stable/uncomplicated  EVALUATION COMPLEXITY: High                                   GOALS: One time visit. No goals established.    PLAN: PT FREQUENCY: one time visit    Westley Foots, PT Westley Foots, PT, DPT, CBIS  05/26/2023, 2:13 PM    I concur with the above findings and recommendations of the therapist:  Physician name printed:         Physician's signature:      Date:

## 2023-06-03 DIAGNOSIS — M1711 Unilateral primary osteoarthritis, right knee: Secondary | ICD-10-CM | POA: Diagnosis not present

## 2023-06-24 DIAGNOSIS — E119 Type 2 diabetes mellitus without complications: Secondary | ICD-10-CM | POA: Diagnosis not present

## 2023-06-24 DIAGNOSIS — Z794 Long term (current) use of insulin: Secondary | ICD-10-CM | POA: Diagnosis not present

## 2023-06-24 DIAGNOSIS — M810 Age-related osteoporosis without current pathological fracture: Secondary | ICD-10-CM | POA: Diagnosis not present

## 2023-06-26 DIAGNOSIS — E119 Type 2 diabetes mellitus without complications: Secondary | ICD-10-CM | POA: Diagnosis not present

## 2023-06-26 DIAGNOSIS — N182 Chronic kidney disease, stage 2 (mild): Secondary | ICD-10-CM | POA: Diagnosis not present

## 2023-06-26 DIAGNOSIS — R110 Nausea: Secondary | ICD-10-CM | POA: Diagnosis not present

## 2023-06-26 DIAGNOSIS — K7469 Other cirrhosis of liver: Secondary | ICD-10-CM | POA: Diagnosis not present

## 2023-06-26 DIAGNOSIS — G8929 Other chronic pain: Secondary | ICD-10-CM | POA: Diagnosis not present

## 2023-06-26 DIAGNOSIS — K7682 Hepatic encephalopathy: Secondary | ICD-10-CM | POA: Diagnosis not present

## 2023-07-06 DIAGNOSIS — M47816 Spondylosis without myelopathy or radiculopathy, lumbar region: Secondary | ICD-10-CM | POA: Diagnosis not present

## 2023-07-13 DIAGNOSIS — Z961 Presence of intraocular lens: Secondary | ICD-10-CM | POA: Diagnosis not present

## 2023-07-13 DIAGNOSIS — H31011 Macula scars of posterior pole (postinflammatory) (post-traumatic), right eye: Secondary | ICD-10-CM | POA: Diagnosis not present

## 2023-07-13 DIAGNOSIS — H25811 Combined forms of age-related cataract, right eye: Secondary | ICD-10-CM | POA: Diagnosis not present
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
## 2023-07-14 ENCOUNTER — Encounter: Payer: Self-pay | Admitting: Registered Nurse

## 2023-07-14 ENCOUNTER — Encounter: Attending: Registered Nurse | Admitting: Registered Nurse

## 2023-07-14 VITALS — BP 126/81 | HR 77 | Ht 66.0 in | Wt 255.2 lb

## 2023-07-14 DIAGNOSIS — W19XXXD Unspecified fall, subsequent encounter: Secondary | ICD-10-CM | POA: Diagnosis not present

## 2023-07-14 DIAGNOSIS — G894 Chronic pain syndrome: Secondary | ICD-10-CM | POA: Insufficient documentation

## 2023-07-14 DIAGNOSIS — M47816 Spondylosis without myelopathy or radiculopathy, lumbar region: Secondary | ICD-10-CM | POA: Insufficient documentation

## 2023-07-14 DIAGNOSIS — F418 Other specified anxiety disorders: Secondary | ICD-10-CM | POA: Diagnosis not present

## 2023-07-14 DIAGNOSIS — Z5181 Encounter for therapeutic drug level monitoring: Secondary | ICD-10-CM | POA: Diagnosis not present

## 2023-07-14 DIAGNOSIS — M1711 Unilateral primary osteoarthritis, right knee: Secondary | ICD-10-CM | POA: Diagnosis not present

## 2023-07-14 DIAGNOSIS — S066XAD Traumatic subarachnoid hemorrhage with loss of consciousness status unknown, subsequent encounter: Secondary | ICD-10-CM | POA: Diagnosis not present

## 2023-07-14 DIAGNOSIS — R0902 Hypoxemia: Secondary | ICD-10-CM | POA: Diagnosis not present

## 2023-07-14 DIAGNOSIS — Y92009 Unspecified place in unspecified non-institutional (private) residence as the place of occurrence of the external cause: Secondary | ICD-10-CM

## 2023-07-14 DIAGNOSIS — M1712 Unilateral primary osteoarthritis, left knee: Secondary | ICD-10-CM | POA: Diagnosis not present

## 2023-07-14 DIAGNOSIS — R202 Paresthesia of skin: Secondary | ICD-10-CM | POA: Insufficient documentation

## 2023-07-14 DIAGNOSIS — Z79891 Long term (current) use of opiate analgesic: Secondary | ICD-10-CM | POA: Diagnosis not present

## 2023-07-14 DIAGNOSIS — S8412XD Injury of peroneal nerve at lower leg level, left leg, subsequent encounter: Secondary | ICD-10-CM | POA: Insufficient documentation

## 2023-07-14 DIAGNOSIS — M7541 Impingement syndrome of right shoulder: Secondary | ICD-10-CM | POA: Insufficient documentation

## 2023-07-14 DIAGNOSIS — S7292XD Unspecified fracture of left femur, subsequent encounter for closed fracture with routine healing: Secondary | ICD-10-CM | POA: Diagnosis not present

## 2023-07-14 DIAGNOSIS — M79605 Pain in left leg: Secondary | ICD-10-CM | POA: Diagnosis not present

## 2023-07-14 DIAGNOSIS — M545 Low back pain, unspecified: Secondary | ICD-10-CM | POA: Diagnosis not present

## 2023-07-14 DIAGNOSIS — S8412XS Injury of peroneal nerve at lower leg level, left leg, sequela: Secondary | ICD-10-CM | POA: Diagnosis not present

## 2023-07-14 MED ORDER — OXYCODONE HCL 10 MG PO TABS: 10.0000 mg | ORAL_TABLET | Freq: Three times a day (TID) | ORAL | 0 refills | Status: DC | PRN

## 2023-07-23 DIAGNOSIS — I129 Hypertensive chronic kidney disease with stage 1 through stage 4 chronic kidney disease, or unspecified chronic kidney disease: Secondary | ICD-10-CM | POA: Diagnosis not present

## 2023-07-23 DIAGNOSIS — Z23 Encounter for immunization: Secondary | ICD-10-CM | POA: Diagnosis not present

## 2023-07-23 DIAGNOSIS — K7290 Hepatic failure, unspecified without coma: Secondary | ICD-10-CM | POA: Diagnosis not present

## 2023-07-23 DIAGNOSIS — K7460 Unspecified cirrhosis of liver: Secondary | ICD-10-CM | POA: Diagnosis not present

## 2023-07-23 DIAGNOSIS — E1121 Type 2 diabetes mellitus with diabetic nephropathy: Secondary | ICD-10-CM | POA: Diagnosis not present

## 2023-07-23 DIAGNOSIS — E039 Hypothyroidism, unspecified: Secondary | ICD-10-CM | POA: Diagnosis not present

## 2023-07-23 DIAGNOSIS — Z0000 Encounter for general adult medical examination without abnormal findings: Secondary | ICD-10-CM | POA: Diagnosis not present

## 2023-07-28 ENCOUNTER — Other Ambulatory Visit (HOSPITAL_COMMUNITY): Payer: Self-pay

## 2023-07-30 DIAGNOSIS — K7460 Unspecified cirrhosis of liver: Secondary | ICD-10-CM | POA: Diagnosis not present

## 2023-07-30 DIAGNOSIS — N184 Chronic kidney disease, stage 4 (severe): Secondary | ICD-10-CM | POA: Diagnosis not present

## 2023-07-30 DIAGNOSIS — N183 Chronic kidney disease, stage 3 unspecified: Secondary | ICD-10-CM | POA: Diagnosis not present

## 2023-07-31 DIAGNOSIS — H25811 Combined forms of age-related cataract, right eye: Secondary | ICD-10-CM | POA: Diagnosis not present

## 2023-08-06 DIAGNOSIS — M47816 Spondylosis without myelopathy or radiculopathy, lumbar region: Secondary | ICD-10-CM | POA: Diagnosis not present

## 2023-08-20 DIAGNOSIS — Z1231 Encounter for screening mammogram for malignant neoplasm of breast: Secondary | ICD-10-CM | POA: Diagnosis not present

## 2023-08-21 DIAGNOSIS — N184 Chronic kidney disease, stage 4 (severe): Secondary | ICD-10-CM | POA: Diagnosis not present

## 2023-08-21 DIAGNOSIS — K7581 Nonalcoholic steatohepatitis (NASH): Secondary | ICD-10-CM | POA: Diagnosis not present

## 2023-08-21 DIAGNOSIS — E1121 Type 2 diabetes mellitus with diabetic nephropathy: Secondary | ICD-10-CM | POA: Diagnosis not present

## 2023-09-02 DIAGNOSIS — M1711 Unilateral primary osteoarthritis, right knee: Secondary | ICD-10-CM | POA: Diagnosis not present

## 2023-09-03 DIAGNOSIS — H31011 Macula scars of posterior pole (postinflammatory) (post-traumatic), right eye: Secondary | ICD-10-CM | POA: Diagnosis not present

## 2023-09-05 DIAGNOSIS — M47816 Spondylosis without myelopathy or radiculopathy, lumbar region: Secondary | ICD-10-CM | POA: Diagnosis not present

## 2023-09-15 ENCOUNTER — Encounter: Attending: Registered Nurse | Admitting: Registered Nurse

## 2023-09-15 VITALS — BP 99/69 | HR 85

## 2023-09-15 DIAGNOSIS — G894 Chronic pain syndrome: Secondary | ICD-10-CM | POA: Insufficient documentation

## 2023-09-15 DIAGNOSIS — M62838 Other muscle spasm: Secondary | ICD-10-CM | POA: Diagnosis not present

## 2023-09-15 DIAGNOSIS — Z5181 Encounter for therapeutic drug level monitoring: Secondary | ICD-10-CM | POA: Insufficient documentation

## 2023-09-15 DIAGNOSIS — M47816 Spondylosis without myelopathy or radiculopathy, lumbar region: Secondary | ICD-10-CM | POA: Insufficient documentation

## 2023-09-15 DIAGNOSIS — M1711 Unilateral primary osteoarthritis, right knee: Secondary | ICD-10-CM | POA: Diagnosis not present

## 2023-09-15 DIAGNOSIS — Z79891 Long term (current) use of opiate analgesic: Secondary | ICD-10-CM | POA: Diagnosis not present

## 2023-09-15 MED ORDER — OXYCODONE HCL 10 MG PO TABS: 10.0000 mg | ORAL_TABLET | Freq: Three times a day (TID) | ORAL | 0 refills | Status: DC | PRN

## 2023-09-15 MED ORDER — METHOCARBAMOL 500 MG PO TABS: 500.0000 mg | ORAL_TABLET | Freq: Four times a day (QID) | ORAL | 1 refills | Status: AC | PRN

## 2023-09-15 MED ORDER — OXYCODONE HCL 10 MG PO TABS
10.0000 mg | ORAL_TABLET | Freq: Three times a day (TID) | ORAL | 0 refills | Status: DC | PRN
Start: 2023-09-15 — End: 2023-11-17
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
## 2023-09-16 ENCOUNTER — Encounter: Payer: Self-pay | Admitting: Registered Nurse

## 2023-09-18 LAB — DRUG TOX MONITOR 1 W/CONF, ORAL FLD
Alprazolam: NEGATIVE ng/mL (ref <0.50)
Aminoclonazepam: 0.91 ng/mL — ABNORMAL HIGH (ref <0.50)
Amphetamines: NEGATIVE ng/mL (ref <10)
Barbiturates: NEGATIVE ng/mL (ref <10)
Benzodiazepines: POSITIVE ng/mL — AB (ref <0.50)
Buprenorphine: NEGATIVE ng/mL (ref <0.10)
Chlordiazepoxide: NEGATIVE ng/mL (ref <0.50)
Clonazepam: NEGATIVE ng/mL (ref <0.50)
Cocaine: NEGATIVE ng/mL (ref <5.0)
Codeine: NEGATIVE ng/mL (ref <2.5)
Diazepam: NEGATIVE ng/mL (ref <0.50)
Dihydrocodeine: NEGATIVE ng/mL (ref <2.5)
Fentanyl: NEGATIVE ng/mL (ref <0.10)
Flunitrazepam: NEGATIVE ng/mL (ref <0.50)
Flurazepam: NEGATIVE ng/mL (ref <0.50)
Heroin Metabolite: NEGATIVE ng/mL (ref <1.0)
Hydrocodone: NEGATIVE ng/mL (ref <2.5)
Hydromorphone: NEGATIVE ng/mL (ref <2.5)
Lorazepam: NEGATIVE ng/mL (ref <0.50)
MARIJUANA: NEGATIVE ng/mL (ref <2.5)
MDMA: NEGATIVE ng/mL (ref <10)
Meprobamate: NEGATIVE ng/mL (ref <2.5)
Methadone: NEGATIVE ng/mL (ref <5.0)
Midazolam: NEGATIVE ng/mL (ref <0.50)
Morphine: NEGATIVE ng/mL (ref <2.5)
Nicotine Metabolite: NEGATIVE ng/mL (ref <5.0)
Nordiazepam: NEGATIVE ng/mL (ref <0.50)
Norhydrocodone: NEGATIVE ng/mL (ref <2.5)
Noroxycodone: 18.3 ng/mL — ABNORMAL HIGH (ref <2.5)
Opiates: POSITIVE ng/mL — AB (ref <2.5)
Oxazepam: NEGATIVE ng/mL (ref <0.50)
Oxycodone: 86.1 ng/mL — ABNORMAL HIGH (ref <2.5)
Oxymorphone: NEGATIVE ng/mL (ref <2.5)
Phencyclidine: NEGATIVE ng/mL (ref <10)
Tapentadol: NEGATIVE ng/mL (ref <5.0)
Temazepam: NEGATIVE ng/mL (ref <0.50)
Tramadol: NEGATIVE ng/mL (ref <5.0)
Triazolam: NEGATIVE ng/mL (ref <0.50)
Zolpidem: NEGATIVE ng/mL (ref <5.0)

## 2023-09-18 LAB — DRUG TOX ALC METAB W/CON, ORAL FLD: Alcohol Metabolite: NEGATIVE ng/mL (ref <25)

## 2023-10-06 DIAGNOSIS — M47816 Spondylosis without myelopathy or radiculopathy, lumbar region: Secondary | ICD-10-CM | POA: Diagnosis not present

## 2023-11-05 DIAGNOSIS — I10 Essential (primary) hypertension: Secondary | ICD-10-CM | POA: Diagnosis not present

## 2023-11-05 DIAGNOSIS — E1122 Type 2 diabetes mellitus with diabetic chronic kidney disease: Secondary | ICD-10-CM | POA: Diagnosis not present

## 2023-11-05 DIAGNOSIS — N1832 Chronic kidney disease, stage 3b: Secondary | ICD-10-CM | POA: Diagnosis not present

## 2023-11-05 DIAGNOSIS — Z794 Long term (current) use of insulin: Secondary | ICD-10-CM | POA: Diagnosis not present

## 2023-11-05 DIAGNOSIS — R300 Dysuria: Secondary | ICD-10-CM | POA: Diagnosis not present

## 2023-11-06 DIAGNOSIS — M47816 Spondylosis without myelopathy or radiculopathy, lumbar region: Secondary | ICD-10-CM | POA: Diagnosis not present

## 2023-11-12 ENCOUNTER — Encounter: Attending: Registered Nurse | Admitting: Registered Nurse

## 2023-11-17 ENCOUNTER — Telehealth: Payer: Self-pay | Admitting: Registered Nurse

## 2023-11-17 ENCOUNTER — Telehealth: Payer: Self-pay

## 2023-11-17 DIAGNOSIS — M47816 Spondylosis without myelopathy or radiculopathy, lumbar region: Secondary | ICD-10-CM

## 2023-11-17 DIAGNOSIS — G894 Chronic pain syndrome: Secondary | ICD-10-CM

## 2023-11-17 DIAGNOSIS — M1712 Unilateral primary osteoarthritis, left knee: Secondary | ICD-10-CM

## 2023-11-17 DIAGNOSIS — M1711 Unilateral primary osteoarthritis, right knee: Secondary | ICD-10-CM

## 2023-11-17 MED ORDER — OXYCODONE HCL 10 MG PO TABS
10.0000 mg | ORAL_TABLET | Freq: Three times a day (TID) | ORAL | 0 refills | Status: DC | PRN
Start: 2023-11-17 — End: 2023-12-02

## 2023-11-17 NOTE — Telephone Encounter (Signed)
 Patient stated she missed her last appointment because of a cold. She has only 2 doses for Oxycodone  on hand. Please seen Rx to [facility] on [address]. Thank you.  PMP report:  Filled  Written  ID  Drug  QTY  Days  Prescriber  RX #  Dispenser  Refill  Daily Dose*  Pymt Type  PMP  10/23/2023 10/19/2023 2  Clonazepam  0.5 Mg Tablet 180.00 90 Ki Sha [id number] Nor (2372) 0/0 2.00 LME Medicare [place] 10/19/2023 09/15/2023 2  Oxycodone  Hcl (Ir) 10 Mg Tab 90.00 30 Eu Tho [id number] Nor (2372) 0/0 45.00 MME Medicare [place]

## 2023-11-17 NOTE — Telephone Encounter (Signed)
 PMP was Reviewed.  Oxycodone  e-scribed to pharmacy Call placed to Katherine [name] regarding the above, she verbalizes understanding.  Her appointment was changed , so her medication and appointments are align. She verbalizes understanding.

## 2023-11-24 ENCOUNTER — Ambulatory Visit: Admitting: Registered Nurse

## 2023-11-25 ENCOUNTER — Emergency Department (HOSPITAL_COMMUNITY)

## 2023-11-25 ENCOUNTER — Encounter (HOSPITAL_COMMUNITY): Payer: Self-pay | Admitting: Pharmacy Technician

## 2023-11-25 ENCOUNTER — Emergency Department (HOSPITAL_COMMUNITY)
Admission: EM | Admit: 2023-11-25 | Discharge: 2023-11-25 | Disposition: A | Discharge status: Home / Self Care | Attending: Emergency Medicine | Admitting: Emergency Medicine

## 2023-11-25 ENCOUNTER — Other Ambulatory Visit: Payer: Self-pay

## 2023-11-25 ENCOUNTER — Other Ambulatory Visit: Payer: Self-pay | Admitting: Nurse Practitioner

## 2023-11-25 DIAGNOSIS — Z794 Long term (current) use of insulin: Secondary | ICD-10-CM | POA: Diagnosis not present

## 2023-11-25 DIAGNOSIS — K7581 Nonalcoholic steatohepatitis (NASH): Secondary | ICD-10-CM | POA: Diagnosis not present

## 2023-11-25 DIAGNOSIS — R188 Other ascites: Secondary | ICD-10-CM | POA: Diagnosis not present

## 2023-11-25 DIAGNOSIS — I700 Atherosclerosis of aorta: Secondary | ICD-10-CM | POA: Diagnosis not present

## 2023-11-25 DIAGNOSIS — K7682 Hepatic encephalopathy: Secondary | ICD-10-CM | POA: Diagnosis not present

## 2023-11-25 DIAGNOSIS — I959 Hypotension, unspecified: Secondary | ICD-10-CM | POA: Diagnosis not present

## 2023-11-25 DIAGNOSIS — R059 Cough, unspecified: Secondary | ICD-10-CM | POA: Diagnosis not present

## 2023-11-25 DIAGNOSIS — K7460 Unspecified cirrhosis of liver: Secondary | ICD-10-CM | POA: Diagnosis not present

## 2023-11-25 DIAGNOSIS — R0602 Shortness of breath: Secondary | ICD-10-CM | POA: Insufficient documentation

## 2023-11-25 DIAGNOSIS — K766 Portal hypertension: Secondary | ICD-10-CM | POA: Diagnosis not present

## 2023-11-25 DIAGNOSIS — R300 Dysuria: Secondary | ICD-10-CM | POA: Diagnosis not present

## 2023-11-25 DIAGNOSIS — I771 Stricture of artery: Secondary | ICD-10-CM | POA: Diagnosis not present

## 2023-11-25 DIAGNOSIS — K3189 Other diseases of stomach and duodenum: Secondary | ICD-10-CM | POA: Diagnosis not present

## 2023-11-25 DIAGNOSIS — R42 Dizziness and giddiness: Secondary | ICD-10-CM | POA: Diagnosis present

## 2023-11-25 LAB — CBC WITH DIFFERENTIAL/PLATELET
Abs Immature Granulocytes: 0.02 K/uL (ref 0.00–0.07)
Basophils Absolute: 0.1 K/uL (ref 0.0–0.1)
Basophils Relative: 1 %
Eosinophils Absolute: 0.4 K/uL (ref 0.0–0.5)
Eosinophils Relative: 6 %
HCT: 47.1 % — ABNORMAL HIGH (ref 36.0–46.0)
Hemoglobin: 14.9 g/dL (ref 12.0–15.0)
Immature Granulocytes: 0 %
Lymphocytes Relative: 22 %
Lymphs Abs: 1.2 K/uL (ref 0.7–4.0)
MCH: 27.6 pg (ref 26.0–34.0)
MCHC: 31.6 g/dL (ref 30.0–36.0)
MCV: 87.2 fL (ref 80.0–100.0)
Monocytes Absolute: 0.7 K/uL (ref 0.1–1.0)
Monocytes Relative: 12 %
Neutro Abs: 3.3 K/uL (ref 1.7–7.7)
Neutrophils Relative %: 59 %
Platelets: 116 K/uL — ABNORMAL LOW (ref 150–400)
RBC: 5.4 MIL/uL — ABNORMAL HIGH (ref 3.87–5.11)
RDW: 13.1 % (ref 11.5–15.5)
WBC: 5.7 K/uL (ref 4.0–10.5)
nRBC: 0 % (ref 0.0–0.2)

## 2023-11-25 LAB — RESP PANEL BY RT-PCR (RSV, FLU A&B, COVID)  RVPGX2
Influenza A by PCR: NEGATIVE
Influenza B by PCR: NEGATIVE
Resp Syncytial Virus by PCR: NEGATIVE
SARS Coronavirus 2 by RT PCR: NEGATIVE

## 2023-11-25 LAB — URINALYSIS, W/ REFLEX TO CULTURE (INFECTION SUSPECTED)
Bacteria, UA: NONE SEEN
Bilirubin Urine: NEGATIVE
Glucose, UA: >=500 mg/dL — AB
Hgb urine dipstick: NEGATIVE
Ketones, ur: NEGATIVE mg/dL
Nitrite: NEGATIVE
Protein, ur: 30 mg/dL — AB
Specific Gravity, Urine: 1.021 (ref 1.005–1.030)
pH: 5 (ref 5.0–8.0)

## 2023-11-25 LAB — COMPREHENSIVE METABOLIC PANEL WITH GFR
ALT: 26 U/L (ref 0–44)
AST: 52 U/L — ABNORMAL HIGH (ref 15–41)
Albumin: 2.9 g/dL — ABNORMAL LOW (ref 3.5–5.0)
Alkaline Phosphatase: 88 U/L (ref 38–126)
Anion gap: 10 (ref 5–15)
BUN: 38 mg/dL — ABNORMAL HIGH (ref 8–23)
CO2: 27 mmol/L (ref 22–32)
Calcium: 9.8 mg/dL (ref 8.9–10.3)
Chloride: 99 mmol/L (ref 98–111)
Creatinine, Ser: 1.94 mg/dL — ABNORMAL HIGH (ref 0.44–1.00)
GFR, Estimated: 28 mL/min — ABNORMAL LOW (ref >60)
Glucose, Bld: 112 mg/dL — ABNORMAL HIGH (ref 70–99)
Potassium: 4.5 mmol/L (ref 3.5–5.1)
Sodium: 136 mmol/L (ref 135–145)
Total Bilirubin: 1.4 mg/dL — ABNORMAL HIGH (ref 0.0–1.2)
Total Protein: 7.8 g/dL (ref 6.5–8.1)

## 2023-11-25 LAB — I-STAT CG4 LACTIC ACID, ED: Lactic Acid, Venous: 1 mmol/L (ref 0.5–1.9)

## 2023-11-25 LAB — AMMONIA: Ammonia: 49 umol/L — ABNORMAL HIGH (ref 9–35)

## 2023-11-25 NOTE — ED Triage Notes (Signed)
 Pt bib ems from liver clinic due to physician concern of hypotension. Staff at office were getting automated BP of 70-80 systolic while using a thigh cuff on patients arm per ems. With EMS pt with manual BP of 100/84. Pt only complaint is dysuria. VSS with EMS.

## 2023-11-25 NOTE — Discharge Instructions (Signed)
 Your lab work for the most part looks okay.  You did look perhaps a little bit dehydrated.  Try to eat and drink as well as you can for the next couple days.  Please follow-up with your family doctor in the office.  Please return for worsening lightheadedness or if you feel that you are going to pass out.

## 2023-11-25 NOTE — ED Provider Notes (Signed)
 [name] Provider Note   CSN: [id number] Arrival date & time: 11/25/23  1305     Patient presents with: Hypotension   Katherine [name] is a 68 y.o. female.   68 yo F with a chief complaints of low blood pressures.  This was documented in her hepatologist's office today.  They then encouraged her to come to the ED for evaluation.  She says she does feel lightheaded at times though seems to be when she goes from a lying to sitting position.  Going on for a few days.  She has been able to get up and walk around without issue.  Denies cough congestion or fever.  Denies nausea vomiting or diarrhea.  She has had some shortness of breath on exertion but tells me has been going on for years.  Perhaps a little bit worse now than it has been.  Denies dark stool or blood in her stool.        Prior to Admission medications   Medication Sig Start Date End Date Taking? Authorizing Provider  acetaminophen  (TYLENOL ) 650 MG CR tablet Take 1,300 mg by mouth every 8 (eight) hours as needed for pain.    [provider]  alendronate (FOSAMAX) 70 MG tablet Take 70 mg by mouth every Wednesday. Take with a full glass of water on an empty stomach.    [provider]  BD PEN NEEDLE NANO U/F 32G X 4 MM MISC  03/17/14   [provider]  clonazePAM  (KLONOPIN ) 0.5 MG tablet Take 0.5 mg by mouth 2 (two) times daily. 03/31/22   [provider]  Continuous Blood Gluc Receiver (DEXCOM G7 RECEIVER) DEVI USE DEXCOM G7 RECEIVER AS INSTRUCTED TO MONITOR BLOOD SUGARS CONTINUOUSLY. 365 DAYS 03/10/22   [provider]  Continuous Blood Gluc Sensor (DEXCOM G7 SENSOR) MISC USE ONE DEXCOM G7 SENSOR EVERY 10 DAYS AS INSTRUCTED TO MONITOR BLOOD SUGARS CONTINUOUSLY 30 DAYS 05/26/22   [provider]  diclofenac  Sodium (VOLTAREN ) 1 % GEL APPLY 4 TIMES DAILY AS NEEDED 12/31/22   Debby Fidela CROME, NP  diphenhydrAMINE  (BENADRYL ) 25 MG  tablet Take 50 mg by mouth at bedtime as needed for allergies or itching.    [provider]  DULoxetine  (CYMBALTA ) 60 MG capsule Take 60 mg by mouth daily.    [provider]  EnovaRX-Diclofenac  Sodium 2.5 % CREA Apply 1-2 topically to right knee twice daily as needed.    [provider]  fluconazole (DIFLUCAN) 100 MG tablet Take 100 mg by mouth daily. 10/08/22   [provider]  gabapentin  (NEURONTIN ) 300 MG capsule TAKE 1 CAPSULE BY MOUTH TWICE  DAILY 05/22/23   Babs Arthea DASEN, MD  insulin  glargine, 1 Unit Dial , ([name]  SOLOSTAR) 300 UNIT/ML Solostar Pen Inject 30 Units into the skin at bedtime. Skip dose if blood sugar is under 200    [provider]  JARDIANCE 10 MG TABS tablet Take 10 mg by mouth daily.    [provider]  KLOR-CON  M10 10 MEQ tablet Take 10 mEq by mouth 2 (two) times daily.    [provider]  lactulose  (CHRONULAC ) 10 GM/15ML solution Take 30 mLs (20 g total) by mouth daily. Patient taking differently: Take 10 g by mouth 3 (three) times daily as needed (ammonia build up). 04/27/22   [name], MD  levothyroxine  (SYNTHROID ) 25 MCG tablet TAKE 1 TABLET BY MOUTH DAILY BEFORE BREAKFAST. 05/02/21   [name], DO  linaclotide  (LINZESS ) 145 MCG CAPS capsule Take 1 capsule (145 mcg total) by mouth daily before breakfast. 04/27/22 09/15/23  [name], MD  methocarbamol  (ROBAXIN ) 500 MG tablet Take 1 tablet (500 mg total) by mouth every 6 (six) hours as needed for muscle spasms. 09/15/23   Debby Fidela CROME, NP  mirtazapine  (REMERON ) 15 MG tablet Take 1 tablet by mouth at bedtime.    [provider]  MOUNJARO 5 MG/0.5ML Pen Inject 5 mg into the skin every Wednesday.    [provider]  Multiple Vitamins-Minerals (CENTRUM WOMEN) TABS Take 1 tablet by mouth daily.    [provider]  naloxone [facility]) nasal spray 4 mg/0.1 mL 1 spray daily. 10/08/22   [provider]   nebivolol  (BYSTOLIC ) 10 MG tablet Take 10 mg by mouth daily.    [provider]  Nystatin (GERHARDT'S BUTT CREAM) CREA Apply 1 Application to affected areas topically as needed for irritation. 08/25/22   [name], MD  ondansetron  (ZOFRAN ) 8 MG tablet Take 8 mg by mouth 3 (three) times daily as needed for vomiting or nausea. 05/09/22   [provider]  Oxycodone  HCl 10 MG TABS Take 1 tablet (10 mg total) by mouth 3 (three) times daily as needed. 11/17/23   Debby Fidela CROME, NP  pantoprazole  (PROTONIX ) 40 MG tablet Take 1 tablet by mouth daily.    [provider]  pantoprazole  (PROTONIX ) 40 MG tablet Take 1 tablet (40 mg total) by mouth 2 (two) times daily. 09/30/22 09/15/23  [name], [name], MD  Polyethyl Glycol-Propyl Glycol (SYSTANE OP) Place 1 drop into both eyes daily as needed (dry eyes).    [provider]  polyethylene glycol powder (MIRALAX ) 17 GM/SCOOP powder Take 17 g by mouth daily as needed for moderate constipation.    [provider]  rifaximin  (XIFAXAN ) 550 MG TABS tablet Take 550 mg by mouth 2 (two) times daily.    [provider]  rizatriptan (MAXALT) 10 MG tablet Take 10 mg by mouth as needed for migraine. May repeat in 2 hours if needed    [provider]  rosuvastatin  (CRESTOR ) 40 MG tablet Take 40 mg by mouth at bedtime.  07/02/17   [provider]  spironolactone  (ALDACTONE ) 50 MG tablet Take 1 tablet (50 mg total) by mouth daily. Patient taking differently: Take 50 mg by mouth daily with lunch. 04/27/22 09/15/23  [name], MD  XIFAXAN  550 MG TABS tablet Take 550 mg by mouth 2 (two) times daily. 05/20/21   [provider]    Allergies: Adhesive [tape] and Morphine  sulfate    Review of Systems  Updated Vital Signs BP 117/78   Pulse 68   Temp 98 F (36.7 C) (Oral)   Resp 18   SpO2 98%   Physical Exam Vitals and nursing note reviewed.  Constitutional:      General: She is not in  acute distress.    Appearance: She is well-developed. She is not diaphoretic.  HENT:     Head: Normocephalic and atraumatic.  Eyes:     Pupils: Pupils are equal, round, and reactive to light.  Cardiovascular:     Rate and Rhythm: Normal rate and regular rhythm.     Heart sounds: No murmur heard.    No friction rub. No gallop.  Pulmonary:     Effort: Pulmonary effort is normal.     Breath sounds: No wheezing or rales.  Abdominal:     General: There is no  distension.     Palpations: Abdomen is soft.     Tenderness: There is no abdominal tenderness.  Musculoskeletal:        General: No tenderness.     Cervical back: Normal range of motion and neck supple.  Skin:    General: Skin is warm and dry.  Neurological:     Mental Status: She is alert and oriented to person, place, and time.  Psychiatric:        Behavior: Behavior normal.     (all labs ordered are listed, but only abnormal results are displayed) Labs Reviewed  CBC WITH DIFFERENTIAL/PLATELET - Abnormal; Notable for the following components:      Result Value   RBC 5.40 (*)    HCT 47.1 (*)    Platelets 116 (*)    All other components within normal limits  COMPREHENSIVE METABOLIC PANEL WITH GFR - Abnormal; Notable for the following components:   Glucose, Bld 112 (*)    BUN 38 (*)    Creatinine, Ser 1.94 (*)    Albumin  2.9 (*)    AST 52 (*)    Total Bilirubin 1.4 (*)    GFR, Estimated 28 (*)    All other components within normal limits  URINALYSIS, W/ REFLEX TO CULTURE (INFECTION SUSPECTED) - Abnormal; Notable for the following components:   Glucose, UA >=500 (*)    Protein, ur 30 (*)    Leukocytes,Ua TRACE (*)    All other components within normal limits  AMMONIA - Abnormal; Notable for the following components:   Ammonia 49 (*)    All other components within normal limits  RESP PANEL BY RT-PCR (RSV, FLU A&B, COVID)  [id number]  URINE CULTURE  CULTURE, BLOOD (ROUTINE X 2)  CULTURE, BLOOD (ROUTINE X 2)  BRAIN  NATRIURETIC PEPTIDE  I-STAT CG4 LACTIC ACID, ED  I-STAT CG4 LACTIC ACID, ED    EKG: EKG Interpretation Date/Time:  Wednesday November 25 2023 14:51:07 EDT Ventricular Rate:  71 PR Interval:  186 QRS Duration:  88 QT Interval:  372 QTC Calculation: 404 R Axis:   54  Text Interpretation: Normal sinus rhythm Cannot rule out Anterior infarct , age undetermined Abnormal ECG No significant change since last tracing Confirmed by Emil Share [phone]) on 11/25/2023 5:12:57 PM  Radiology: [facility] Chest 2 View Result Date: 11/25/2023 CLINICAL DATA:  cough, hypotension EXAM: CHEST - 2 VIEW COMPARISON:  None available. FINDINGS: Emphysema. No focal airspace consolidation, pleural effusion, or pneumothorax. No cardiomegaly. Tortuous aorta with aortic atherosclerosis. No acute fracture or destructive lesions. Multilevel thoracic osteophytosis. Screw and plate fixation again noted in the proximal right humerus. IMPRESSION: No acute cardiopulmonary abnormality. Electronically Signed   By: Rogelia Myers M.D.   On: 11/25/2023 15:48     Procedures   Medications Ordered in the ED - No data to display                                  Medical Decision Making  68 yo F with a chief complaints of low blood pressures.  The patient has a difficult body habitus to obtain a blood pressure.  She is not obviously symptomatic.  Is able to get up and walk here without issue.  Lactate is normal, UA and chest x-ray without obvious infection.  Metabolic panel with perhaps mild AKI.  Will have the patient try to increase her fluid intake at home.  Recheck blood work  with PCP.  5:48 PM:  I have discussed the diagnosis/risks/treatment options with the patient.  Evaluation and diagnostic testing in the emergency department does not suggest an emergent condition requiring admission or immediate intervention beyond what has been performed at this time.  They will follow up with PCP. We also discussed returning to the ED  immediately if new or worsening sx occur. We discussed the sx which are most concerning (e.g., sudden worsening pain, fever, inability to tolerate by mouth) that necessitate immediate return. Medications administered to the patient during their visit and any new prescriptions provided to the patient are listed below.  Medications given during this visit Medications - No data to display   The patient appears reasonably screen and/or stabilized for discharge and I doubt any other medical condition or other [facility] requiring further screening, evaluation, or treatment in the ED at this time prior to discharge.       Final diagnoses:  Hypotension, unspecified hypotension type    ED Discharge Orders     None          Emil Share, DO 11/25/23 1748

## 2023-11-25 NOTE — ED Provider Triage Note (Signed)
 Emergency Medicine Provider Triage Evaluation Note  Katherine [name] , a 68 y.o. female  was evaluated in triage.  Pt complains of fatigue, cough, dysuria, hypotension in clinic.  Review of Systems  Positive: Soft pressures, lightheadedness, fatigue, dysuria, cough Negative: Nausea, vomiting, constipation, diarrhea  Physical Exam  BP 93/70   Pulse 76   Temp 98.1 F (36.7 C)   Resp 17   SpO2 91%  Gen:   Awake, no distress   Resp:  Normal effort, rhonchi in the bases with no wheezing MSK:   Moves extremities without difficulty  Other:  Moving all extremities  Medical Decision Making  Medically screening exam initiated at 2:33 PM.  Appropriate orders placed.  Katherine [name] was informed that the remainder of the evaluation will be completed by another provider, this initial triage assessment does not replace that evaluation, and the importance of remaining in the ED until their evaluation is complete.  Katherine [name] is a 68 y.o. female with a past medical history significant for hypertension, hyperlipidemia, diabetes, CKD, GERD, cirrhosis, and hepatic encephalopathy who presents with hypotension with blood pressure about 70 systolic in clinic.  Patient has been complaining of some dysuria and foul-smelling urine for the last few days as well as some cough.  She denies any chest pain palpitations or shortness of breath at this time.  She denies any syncope but is had fatigue and lightheadedness.  She reports her blood pressure normally is around 120 systolic and has been much lower the last few days.  She denies any trauma and denies abdominal pain or distention.  Reports chronic right knee pain that is no different.  On my exam, lungs had rhonchi and some faint rales.  Chest nontender.  Abdomen nontender.  Moving all extremities.  Patient blood pressure on arrival was 84 but has slightly improved since she has been here.  Due to the 2 different sources of possible infection we will  get urinalysis, chest x-ray, get labs.  With her fatigue and history of encephalopathy will get ammonia level.  Anticipate assessment when she gets seen by provider as well as reassessment after workup to determine disposition.    Katherine [name], Katherine PARAS, MD 11/25/23 [phone]

## 2023-11-26 DIAGNOSIS — I959 Hypotension, unspecified: Secondary | ICD-10-CM | POA: Diagnosis not present

## 2023-11-27 LAB — URINE CULTURE: Culture: >=100000 — AB

## 2023-11-27 NOTE — Telephone Encounter (Signed)
 LVM for patient to return call to schedule March 2026 follow up.

## 2023-11-28 ENCOUNTER — Telehealth (HOSPITAL_BASED_OUTPATIENT_CLINIC_OR_DEPARTMENT_OTHER): Payer: Self-pay

## 2023-11-28 NOTE — Progress Notes (Addendum)
 ED Antimicrobial Stewardship Positive Culture Follow Up   Katherine [name] is an 68 y.o. female who presented to [facility] on @ADMITDT @ with a chief complaint of  Chief Complaint  Patient presents with   Hypotension    Recent Results (from the past 720 hours)  Urine Culture     Status: Abnormal   Collection Time: 11/25/23  2:37 PM   Specimen: Urine, Clean Catch  Result Value Ref Range Status   Specimen Description URINE, CLEAN CATCH  Final   Special Requests   Final    NONE Performed at [facility], [address], [place], [place] [address]    Culture >=100,000 COLONIES/mL ENTEROCOCCUS FAECALIS (A)  Final   Report Status 11/27/2023 FINAL  Final   Organism ID, Bacteria ENTEROCOCCUS FAECALIS (A)  Final      Susceptibility   Enterococcus faecalis - MIC*    AMPICILLIN  <=2 SENSITIVE Sensitive     NITROFURANTOIN <=16 SENSITIVE Sensitive     VANCOMYCIN  1 SENSITIVE Sensitive     * >=100,000 COLONIES/mL ENTEROCOCCUS FAECALIS  Resp panel by RT-PCR (RSV, Flu A&B, Covid) Anterior Nasal Swab     Status: None   Collection Time: 11/25/23  3:28 PM   Specimen: Anterior Nasal Swab  Result Value Ref Range Status   SARS Coronavirus 2 by RT PCR NEGATIVE NEGATIVE Final   Influenza A by PCR NEGATIVE NEGATIVE Final   Influenza B by PCR NEGATIVE NEGATIVE Final    Comment: (NOTE) The Xpert Xpress SARS-CoV-2/FLU/RSV plus assay is intended as an aid in the diagnosis of influenza from Nasopharyngeal swab specimens and should not be used as a sole basis for treatment. Nasal washings and aspirates are unacceptable for Xpert Xpress SARS-CoV-2/FLU/RSV testing.  Fact Sheet for Patients: BloggerCourse.com  Fact Sheet for Healthcare Providers: SeriousBroker.it  This test is not yet approved or cleared by the [place]  FDA and has been authorized for detection and/or diagnosis of SARS-CoV-2 by FDA under an Emergency Use Authorization (EUA).  This EUA will remain in effect (meaning this test can be used) for the duration of the COVID-19 declaration under Section 564(b)(1) of the Act, 21 U.S.C. section 360bbb-3(b)(1), unless the authorization is terminated or revoked.     Resp Syncytial Virus by PCR NEGATIVE NEGATIVE Final    Comment: (NOTE) Fact Sheet for Patients: BloggerCourse.com  Fact Sheet for Healthcare Providers: SeriousBroker.it  This test is not yet approved or cleared by the [place]  FDA and has been authorized for detection and/or diagnosis of SARS-CoV-2 by FDA under an Emergency Use Authorization (EUA). This EUA will remain in effect (meaning this test can be used) for the duration of the COVID-19 declaration under Section 564(b)(1) of the Act, 21 U.S.C. section 360bbb-3(b)(1), unless the authorization is terminated or revoked.  Performed at [facility], [address] [place], [place] [address]   Blood culture (routine x 2)     Status: None (Preliminary result)   Collection Time: 11/25/23  3:40 PM   Specimen: BLOOD  Result Value Ref Range Status   Specimen Description BLOOD LEFT ANTECUBITAL  Final   Special Requests   Final    BOTTLES DRAWN AEROBIC AND ANAEROBIC Blood Culture adequate volume   Culture   Final    NO GROWTH 3 DAYS Performed at [facility], [address] [place], [place] [address]    Report Status PENDING  Incomplete    [x]  Please call patient and see if patient is having urinary  symptoms or signs of infection.  Yes >> Call me for antbiotics per Dr. Melvenia No >> Nothing is needed.  ED Provider: Bernardino Melvenia Dorn Job, PharmD, BCPS 11/28/2023 10:06 AM ED Clinical Pharmacist -  [phone]  **ADDENDUM 11/28/23 4:17 PM** Placement RN stating that patient is having urinary symptoms  Start Augmentin  875/125 mg tablets twice daily for 10 days (qty 20; refills 0)  MD Bernardino Melvenia  Dorn Job,  PharmD, BCPS 11/28/2023 4:17 PM ED Clinical Pharmacist -  [phone]

## 2023-11-28 NOTE — Telephone Encounter (Signed)
 Post ED Visit - Positive Culture Follow-up: Successful Patient Follow-Up  Culture assessed and recommendations reviewed by:  [x]  Dorn Buttner, Pharm.D. []  Venetia Gully, Pharm.D., BCPS AQ-ID []  Garrel Crews, Pharm.D., BCPS []  Almarie Lunger, Pharm.D., BCPS []  [place], [address].D., BCPS, AAHIVP []  Rosaline Bihari, Pharm.D., BCPS, AAHIVP []  Vernell Meier, PharmD, BCPS []  Latanya Hint, PharmD, BCPS []  Donald Medley, PharmD, BCPS []  Rocky Bold, PharmD  Positive urine culture  [x]  Patient discharged without antimicrobial prescription and treatment is now indicated []  Organism is resistant to prescribed ED discharge antimicrobial []  Patient with positive blood cultures  plan: call pt for s/s check for urinary s/s. If has s/s call Pharmacist. if no s/s nothing needed.   Spoke with pt, pt c/o burning with urination and foul odor. Pharmacist Dorn Buttner notified. New abx ordered.   Changes discussed with ED provider: Bernardino Fireman, MD New antibiotic prescription Augmentin  875/125 1 tablet po BID x 10 days.  Called to [facility] on [address]  Contacted patient, date 11/28/23, time 4:40 pm   Katherine [name] 11/28/2023, 4:56 PM

## 2023-11-30 DIAGNOSIS — K7460 Unspecified cirrhosis of liver: Secondary | ICD-10-CM | POA: Diagnosis not present

## 2023-11-30 DIAGNOSIS — E1121 Type 2 diabetes mellitus with diabetic nephropathy: Secondary | ICD-10-CM | POA: Diagnosis not present

## 2023-11-30 DIAGNOSIS — E039 Hypothyroidism, unspecified: Secondary | ICD-10-CM | POA: Diagnosis not present

## 2023-11-30 DIAGNOSIS — G8929 Other chronic pain: Secondary | ICD-10-CM | POA: Diagnosis not present

## 2023-11-30 DIAGNOSIS — I959 Hypotension, unspecified: Secondary | ICD-10-CM | POA: Diagnosis not present

## 2023-11-30 LAB — CULTURE, BLOOD (ROUTINE X 2)
Culture: NO GROWTH
Special Requests: ADEQUATE

## 2023-12-01 DIAGNOSIS — M8000XA Age-related osteoporosis with current pathological fracture, unspecified site, initial encounter for fracture: Secondary | ICD-10-CM | POA: Diagnosis not present

## 2023-12-01 DIAGNOSIS — E1169 Type 2 diabetes mellitus with other specified complication: Secondary | ICD-10-CM | POA: Diagnosis not present

## 2023-12-02 ENCOUNTER — Encounter: Attending: Registered Nurse | Admitting: Registered Nurse

## 2023-12-02 ENCOUNTER — Encounter: Payer: Self-pay | Admitting: Registered Nurse

## 2023-12-02 VITALS — BP 109/70 | HR 88 | Wt 253.0 lb

## 2023-12-02 DIAGNOSIS — M47816 Spondylosis without myelopathy or radiculopathy, lumbar region: Secondary | ICD-10-CM | POA: Insufficient documentation

## 2023-12-02 DIAGNOSIS — M1711 Unilateral primary osteoarthritis, right knee: Secondary | ICD-10-CM | POA: Diagnosis not present

## 2023-12-02 DIAGNOSIS — M1712 Unilateral primary osteoarthritis, left knee: Secondary | ICD-10-CM | POA: Insufficient documentation

## 2023-12-02 DIAGNOSIS — Z79891 Long term (current) use of opiate analgesic: Secondary | ICD-10-CM | POA: Insufficient documentation

## 2023-12-02 DIAGNOSIS — Z5181 Encounter for therapeutic drug level monitoring: Secondary | ICD-10-CM | POA: Insufficient documentation

## 2023-12-02 DIAGNOSIS — G894 Chronic pain syndrome: Secondary | ICD-10-CM | POA: Insufficient documentation

## 2023-12-02 MED ORDER — OXYCODONE HCL 10 MG PO TABS: 10.0000 mg | ORAL_TABLET | Freq: Three times a day (TID) | ORAL | 0 refills | Status: DC | PRN
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
## 2023-12-04 DIAGNOSIS — H35052 Retinal neovascularization, unspecified, left eye: Secondary | ICD-10-CM | POA: Diagnosis not present

## 2023-12-04 DIAGNOSIS — Z961 Presence of intraocular lens: Secondary | ICD-10-CM | POA: Diagnosis not present

## 2023-12-04 DIAGNOSIS — H43812 Vitreous degeneration, left eye: Secondary | ICD-10-CM | POA: Diagnosis not present

## 2023-12-05 ENCOUNTER — Encounter: Payer: Self-pay | Admitting: Registered Nurse

## 2023-12-06 DIAGNOSIS — M47816 Spondylosis without myelopathy or radiculopathy, lumbar region: Secondary | ICD-10-CM | POA: Diagnosis not present

## 2023-12-06 LAB — DRUG TOX MONITOR 1 W/CONF, ORAL FLD
AMINOCLONAZEPAM: 6.67 ng/mL — ABNORMAL HIGH (ref <0.50)
Alprazolam: NEGATIVE ng/mL (ref <0.50)
Amphetamines: NEGATIVE ng/mL (ref <10)
Barbiturates: NEGATIVE ng/mL (ref <10)
Benzodiazepines: POSITIVE ng/mL — AB (ref <0.50)
Buprenorphine: NEGATIVE ng/mL (ref <0.10)
Chlordiazepoxide: NEGATIVE ng/mL (ref <0.50)
Clonazepam: 0.62 ng/mL — ABNORMAL HIGH (ref <0.50)
Cocaine: NEGATIVE ng/mL (ref <5.0)
Codeine: NEGATIVE ng/mL (ref <2.5)
Diazepam: NEGATIVE ng/mL (ref <0.50)
Dihydrocodeine: NEGATIVE ng/mL (ref <2.5)
Fentanyl: NEGATIVE ng/mL (ref <0.10)
Flunitrazepam: NEGATIVE ng/mL (ref <0.50)
Flurazepam: NEGATIVE ng/mL (ref <0.50)
Heroin Metabolite: NEGATIVE ng/mL (ref <1.0)
Hydrocodone: NEGATIVE ng/mL (ref <2.5)
Hydromorphone: NEGATIVE ng/mL (ref <2.5)
Lorazepam: NEGATIVE ng/mL (ref <0.50)
MARIJUANA: NEGATIVE ng/mL (ref <2.5)
MDMA: NEGATIVE ng/mL (ref <10)
Meprobamate: NEGATIVE ng/mL (ref <2.5)
Methadone: NEGATIVE ng/mL (ref <5.0)
Midazolam: NEGATIVE ng/mL (ref <0.50)
Morphine: NEGATIVE ng/mL (ref <2.5)
Nicotine Metabolite: NEGATIVE ng/mL (ref <5.0)
Nordiazepam: NEGATIVE ng/mL (ref <0.50)
Norhydrocodone: NEGATIVE ng/mL (ref <2.5)
Noroxycodone: 99.6 ng/mL — ABNORMAL HIGH (ref <2.5)
Opiates: POSITIVE ng/mL — AB (ref <2.5)
Oxazepam: NEGATIVE ng/mL (ref <0.50)
Oxycodone: >250 ng/mL — ABNORMAL HIGH (ref <2.5)
Oxymorphone: 6.8 ng/mL — ABNORMAL HIGH (ref <2.5)
Phencyclidine: NEGATIVE ng/mL (ref <10)
Tapentadol: NEGATIVE ng/mL (ref <5.0)
Temazepam: NEGATIVE ng/mL (ref <0.50)
Tramadol: NEGATIVE ng/mL (ref <5.0)
Triazolam: NEGATIVE ng/mL (ref <0.50)
Zolpidem: NEGATIVE ng/mL (ref <5.0)

## 2023-12-06 LAB — DRUG TOX ALC METAB W/CON, ORAL FLD: Alcohol Metabolite: NEGATIVE ng/mL (ref <25)

## 2023-12-09 ENCOUNTER — Ambulatory Visit
Admission: RE | Admit: 2023-12-09 | Discharge: 2023-12-09 | Disposition: A | Source: Ambulatory Visit | Attending: Nurse Practitioner

## 2023-12-09 DIAGNOSIS — K7469 Other cirrhosis of liver: Secondary | ICD-10-CM

## 2023-12-09 DIAGNOSIS — R188 Other ascites: Secondary | ICD-10-CM

## 2023-12-09 DIAGNOSIS — K7460 Unspecified cirrhosis of liver: Secondary | ICD-10-CM | POA: Diagnosis not present

## 2023-12-23 DIAGNOSIS — R238 Other skin changes: Secondary | ICD-10-CM | POA: Diagnosis not present

## 2023-12-23 DIAGNOSIS — R5383 Other fatigue: Secondary | ICD-10-CM | POA: Diagnosis not present

## 2023-12-23 DIAGNOSIS — M810 Age-related osteoporosis without current pathological fracture: Secondary | ICD-10-CM | POA: Diagnosis not present

## 2023-12-23 DIAGNOSIS — Z794 Long term (current) use of insulin: Secondary | ICD-10-CM | POA: Diagnosis not present

## 2023-12-23 DIAGNOSIS — E119 Type 2 diabetes mellitus without complications: Secondary | ICD-10-CM | POA: Diagnosis not present

## 2023-12-23 DIAGNOSIS — Z8679 Personal history of other diseases of the circulatory system: Secondary | ICD-10-CM | POA: Diagnosis not present

## 2024-01-29 ENCOUNTER — Telehealth: Payer: Self-pay | Admitting: Hematology and Oncology

## 2024-01-29 NOTE — Telephone Encounter (Signed)
 I returned patient's call from a left voicemail that stated her primary care physician told her she no longer needed her [facility] Heme appointment on 02/01/2024. I left voicemail for patient stating that I cancelled the appointment per her request and if anything changes to give the office a call back.

## 2024-02-01 ENCOUNTER — Inpatient Hospital Stay

## 2024-02-01 ENCOUNTER — Inpatient Hospital Stay: Admitting: Hematology and Oncology

## 2024-02-02 ENCOUNTER — Other Ambulatory Visit: Payer: Self-pay | Admitting: Physical Medicine & Rehabilitation

## 2024-02-02 DIAGNOSIS — S8412XS Injury of peroneal nerve at lower leg level, left leg, sequela: Secondary | ICD-10-CM

## 2024-02-12 ENCOUNTER — Telehealth: Payer: Self-pay | Admitting: Registered Nurse

## 2024-02-12 ENCOUNTER — Encounter: Admitting: Registered Nurse

## 2024-02-12 DIAGNOSIS — M1711 Unilateral primary osteoarthritis, right knee: Secondary | ICD-10-CM

## 2024-02-12 DIAGNOSIS — G894 Chronic pain syndrome: Secondary | ICD-10-CM

## 2024-02-12 DIAGNOSIS — M47816 Spondylosis without myelopathy or radiculopathy, lumbar region: Secondary | ICD-10-CM

## 2024-02-12 MED ORDER — OXYCODONE HCL 10 MG PO TABS: 10.0000 mg | ORAL_TABLET | Freq: Three times a day (TID) | ORAL | 0 refills | Status: DC | PRN

## 2024-02-12 NOTE — Telephone Encounter (Signed)
 Patient canceled appointment today with Fidela, not feeling well.  We did reschedule her to Dec 24 at 10:40.  She will need a refill on her pain medication.

## 2024-02-12 NOTE — Telephone Encounter (Signed)
 Return Ms. Swab call, no answer.  Left message to return the call.  PDMP was Reviewed.  Oxycodone  e-scribed to pharmacy.  April spoke to Ms. Johnita Lex and instructed her to call her Neurologist and PCP regarding her seizure.

## 2024-02-12 NOTE — Telephone Encounter (Signed)
 Return Katherine [name] call, she reports she hasn't called her PCP or neurologist regarding the seizure. She was encouraged to call her PCP regarding the above, she verbalizes understanding. She states Dr Loreli is following her seizure, she verbalizes understanding.

## 2024-02-24 ENCOUNTER — Encounter: Attending: Registered Nurse | Admitting: Registered Nurse

## 2024-02-24 ENCOUNTER — Encounter: Payer: Self-pay | Admitting: Registered Nurse

## 2024-02-24 VITALS — BP 133/81 | HR 83 | Ht 66.0 in | Wt 242.8 lb

## 2024-02-24 DIAGNOSIS — M47816 Spondylosis without myelopathy or radiculopathy, lumbar region: Secondary | ICD-10-CM | POA: Diagnosis present

## 2024-02-24 DIAGNOSIS — R202 Paresthesia of skin: Secondary | ICD-10-CM | POA: Insufficient documentation

## 2024-02-24 DIAGNOSIS — Z5181 Encounter for therapeutic drug level monitoring: Secondary | ICD-10-CM | POA: Diagnosis present

## 2024-02-24 DIAGNOSIS — M1711 Unilateral primary osteoarthritis, right knee: Secondary | ICD-10-CM | POA: Diagnosis present

## 2024-02-24 DIAGNOSIS — G894 Chronic pain syndrome: Secondary | ICD-10-CM | POA: Diagnosis present

## 2024-02-24 DIAGNOSIS — R5381 Other malaise: Secondary | ICD-10-CM | POA: Diagnosis present

## 2024-02-24 DIAGNOSIS — Z79891 Long term (current) use of opiate analgesic: Secondary | ICD-10-CM | POA: Insufficient documentation

## 2024-02-24 MED ORDER — OXYCODONE HCL 10 MG PO TABS: 10.0000 mg | ORAL_TABLET | Freq: Three times a day (TID) | ORAL | 0 refills | Status: AC | PRN
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
## 2024-04-12 ENCOUNTER — Encounter: Admitting: Registered Nurse

## 2024-04-19 ENCOUNTER — Encounter: Admitting: Registered Nurse
